# Patient Record
Sex: Male | Born: 1945
Health system: Southern US, Community
[De-identification: ages and names within clinical notes are randomized; demographics above are authoritative.]

## PROBLEM LIST (undated history)

## (undated) DIAGNOSIS — J301 Allergic rhinitis due to pollen: Secondary | ICD-10-CM

## (undated) DIAGNOSIS — D649 Anemia, unspecified: Secondary | ICD-10-CM

## (undated) DIAGNOSIS — R011 Cardiac murmur, unspecified: Secondary | ICD-10-CM

## (undated) DIAGNOSIS — I6529 Occlusion and stenosis of unspecified carotid artery: Secondary | ICD-10-CM

## (undated) DIAGNOSIS — N419 Inflammatory disease of prostate, unspecified: Secondary | ICD-10-CM

## (undated) DIAGNOSIS — E7439 Other disorders of intestinal carbohydrate absorption: Secondary | ICD-10-CM

## (undated) DIAGNOSIS — H9190 Unspecified hearing loss, unspecified ear: Secondary | ICD-10-CM

## (undated) DIAGNOSIS — N400 Enlarged prostate without lower urinary tract symptoms: Secondary | ICD-10-CM

## (undated) DIAGNOSIS — M1990 Unspecified osteoarthritis, unspecified site: Secondary | ICD-10-CM

## (undated) DIAGNOSIS — Z9289 Personal history of other medical treatment: Secondary | ICD-10-CM

## (undated) DIAGNOSIS — R200 Anesthesia of skin: Secondary | ICD-10-CM

## (undated) DIAGNOSIS — E7800 Pure hypercholesterolemia, unspecified: Secondary | ICD-10-CM

## (undated) DIAGNOSIS — I499 Cardiac arrhythmia, unspecified: Secondary | ICD-10-CM

## (undated) DIAGNOSIS — I350 Nonrheumatic aortic (valve) stenosis: Secondary | ICD-10-CM

## (undated) DIAGNOSIS — R7303 Prediabetes: Secondary | ICD-10-CM

## (undated) DIAGNOSIS — R972 Elevated prostate specific antigen [PSA]: Secondary | ICD-10-CM

## (undated) HISTORY — DX: Personal history of other medical treatment: Z92.89

## (undated) HISTORY — DX: Elevated prostate specific antigen (PSA): R97.20

## (undated) HISTORY — PX: COLONOSCOPY: SHX174

## (undated) HISTORY — DX: Pure hypercholesterolemia, unspecified: E78.00

## (undated) HISTORY — PX: SHOULDER ARTHROSCOPY: SHX128

## (undated) HISTORY — DX: Other disorders of intestinal carbohydrate absorption: E74.39

## (undated) HISTORY — DX: Benign prostatic hyperplasia without lower urinary tract symptoms: N40.0

## (undated) HISTORY — DX: Inflammatory disease of prostate, unspecified: N41.9

## (undated) HISTORY — DX: Occlusion and stenosis of unspecified carotid artery: I65.29

## (undated) HISTORY — PX: TOOTH EXTRACTION: SUR596

## (undated) HISTORY — PX: HIP SURGERY: SHX245

## (undated) HISTORY — DX: Allergic rhinitis due to pollen: J30.1

## (undated) HISTORY — DX: Nonrheumatic aortic (valve) stenosis: I35.0

---

## 1999-02-04 ENCOUNTER — Observation Stay (HOSPITAL_COMMUNITY): Admission: EM | Admit: 1999-02-04 | Discharge: 1999-02-05 | Payer: Self-pay | Admitting: Emergency Medicine

## 1999-02-04 ENCOUNTER — Encounter: Payer: Self-pay | Admitting: Emergency Medicine

## 1999-02-06 ENCOUNTER — Encounter: Payer: Self-pay | Admitting: Cardiology

## 2003-08-07 ENCOUNTER — Emergency Department (HOSPITAL_COMMUNITY): Admission: EM | Admit: 2003-08-07 | Discharge: 2003-08-07 | Payer: Self-pay | Admitting: Emergency Medicine

## 2009-09-09 HISTORY — PX: CERVICAL SPINE SURGERY: SHX589

## 2012-06-02 DIAGNOSIS — M5440 Lumbago with sciatica, unspecified side: Secondary | ICD-10-CM

## 2012-06-02 HISTORY — DX: Lumbago with sciatica, unspecified side: M54.40

## 2012-09-09 HISTORY — PX: LUMBAR DISC SURGERY: SHX700

## 2013-09-20 DIAGNOSIS — M48062 Spinal stenosis, lumbar region with neurogenic claudication: Secondary | ICD-10-CM | POA: Insufficient documentation

## 2013-09-20 DIAGNOSIS — M25551 Pain in right hip: Secondary | ICD-10-CM | POA: Insufficient documentation

## 2013-10-05 ENCOUNTER — Ambulatory Visit: Payer: Self-pay | Admitting: Family Medicine

## 2013-11-30 DIAGNOSIS — Z9889 Other specified postprocedural states: Secondary | ICD-10-CM | POA: Insufficient documentation

## 2015-10-24 DIAGNOSIS — M5412 Radiculopathy, cervical region: Secondary | ICD-10-CM | POA: Diagnosis not present

## 2015-10-24 DIAGNOSIS — M5126 Other intervertebral disc displacement, lumbar region: Secondary | ICD-10-CM | POA: Diagnosis not present

## 2015-10-24 DIAGNOSIS — M4806 Spinal stenosis, lumbar region: Secondary | ICD-10-CM | POA: Diagnosis not present

## 2015-10-24 DIAGNOSIS — R1030 Lower abdominal pain, unspecified: Secondary | ICD-10-CM | POA: Diagnosis not present

## 2016-05-03 DIAGNOSIS — Z9889 Other specified postprocedural states: Secondary | ICD-10-CM | POA: Diagnosis not present

## 2016-05-03 DIAGNOSIS — M4806 Spinal stenosis, lumbar region: Secondary | ICD-10-CM | POA: Diagnosis not present

## 2016-05-03 DIAGNOSIS — M5412 Radiculopathy, cervical region: Secondary | ICD-10-CM | POA: Diagnosis not present

## 2016-05-03 DIAGNOSIS — R1030 Lower abdominal pain, unspecified: Secondary | ICD-10-CM | POA: Diagnosis not present

## 2016-05-03 DIAGNOSIS — M5126 Other intervertebral disc displacement, lumbar region: Secondary | ICD-10-CM | POA: Diagnosis not present

## 2016-07-02 DIAGNOSIS — R011 Cardiac murmur, unspecified: Secondary | ICD-10-CM | POA: Diagnosis not present

## 2016-07-02 DIAGNOSIS — M539 Dorsopathy, unspecified: Secondary | ICD-10-CM | POA: Diagnosis not present

## 2016-07-02 DIAGNOSIS — Z0000 Encounter for general adult medical examination without abnormal findings: Secondary | ICD-10-CM | POA: Diagnosis not present

## 2016-07-02 DIAGNOSIS — Z79899 Other long term (current) drug therapy: Secondary | ICD-10-CM | POA: Diagnosis not present

## 2016-07-02 DIAGNOSIS — Z125 Encounter for screening for malignant neoplasm of prostate: Secondary | ICD-10-CM | POA: Diagnosis not present

## 2016-07-02 DIAGNOSIS — E785 Hyperlipidemia, unspecified: Secondary | ICD-10-CM | POA: Diagnosis not present

## 2016-07-02 DIAGNOSIS — Z6826 Body mass index (BMI) 26.0-26.9, adult: Secondary | ICD-10-CM | POA: Diagnosis not present

## 2016-07-02 DIAGNOSIS — R7301 Impaired fasting glucose: Secondary | ICD-10-CM | POA: Diagnosis not present

## 2016-07-17 ENCOUNTER — Other Ambulatory Visit (HOSPITAL_COMMUNITY): Payer: Self-pay

## 2016-07-17 DIAGNOSIS — R011 Cardiac murmur, unspecified: Secondary | ICD-10-CM

## 2016-07-22 DIAGNOSIS — R972 Elevated prostate specific antigen [PSA]: Secondary | ICD-10-CM | POA: Diagnosis not present

## 2016-07-30 ENCOUNTER — Ambulatory Visit (HOSPITAL_COMMUNITY)
Admission: RE | Admit: 2016-07-30 | Discharge: 2016-07-30 | Disposition: A | Discharge status: Home / Self Care | Payer: Medicare Other | Source: Ambulatory Visit | Attending: Medical | Admitting: Medical

## 2016-07-30 ENCOUNTER — Other Ambulatory Visit (HOSPITAL_COMMUNITY): Payer: Self-pay

## 2016-07-30 DIAGNOSIS — I501 Left ventricular failure: Secondary | ICD-10-CM | POA: Diagnosis not present

## 2016-07-30 DIAGNOSIS — L570 Actinic keratosis: Secondary | ICD-10-CM | POA: Diagnosis not present

## 2016-07-30 DIAGNOSIS — L814 Other melanin hyperpigmentation: Secondary | ICD-10-CM | POA: Diagnosis not present

## 2016-07-30 DIAGNOSIS — I350 Nonrheumatic aortic (valve) stenosis: Secondary | ICD-10-CM | POA: Insufficient documentation

## 2016-07-30 DIAGNOSIS — R011 Cardiac murmur, unspecified: Secondary | ICD-10-CM | POA: Insufficient documentation

## 2016-07-30 DIAGNOSIS — R202 Paresthesia of skin: Secondary | ICD-10-CM | POA: Diagnosis not present

## 2016-07-30 DIAGNOSIS — Z23 Encounter for immunization: Secondary | ICD-10-CM | POA: Diagnosis not present

## 2016-07-30 DIAGNOSIS — D1800 Hemangioma unspecified site: Secondary | ICD-10-CM | POA: Diagnosis not present

## 2016-07-30 DIAGNOSIS — I351 Nonrheumatic aortic (valve) insufficiency: Secondary | ICD-10-CM | POA: Diagnosis not present

## 2016-07-30 DIAGNOSIS — L821 Other seborrheic keratosis: Secondary | ICD-10-CM | POA: Diagnosis not present

## 2016-07-30 DIAGNOSIS — D225 Melanocytic nevi of trunk: Secondary | ICD-10-CM | POA: Diagnosis not present

## 2016-07-30 NOTE — Progress Notes (Signed)
Echocardiogram 2D Echocardiogram has been performed.  Cody Mcconnell 07/30/2016, 3:31 PM

## 2016-08-30 DIAGNOSIS — R972 Elevated prostate specific antigen [PSA]: Secondary | ICD-10-CM | POA: Insufficient documentation

## 2016-08-30 DIAGNOSIS — N401 Enlarged prostate with lower urinary tract symptoms: Secondary | ICD-10-CM

## 2016-08-30 DIAGNOSIS — N138 Other obstructive and reflux uropathy: Secondary | ICD-10-CM | POA: Insufficient documentation

## 2016-08-30 HISTORY — DX: Elevated prostate specific antigen (PSA): R97.20

## 2016-09-11 DIAGNOSIS — R972 Elevated prostate specific antigen [PSA]: Secondary | ICD-10-CM | POA: Diagnosis not present

## 2016-12-16 ENCOUNTER — Ambulatory Visit (HOSPITAL_COMMUNITY)
Admission: EM | Admit: 2016-12-16 | Discharge: 2016-12-16 | Disposition: A | Discharge status: Home / Self Care | Payer: Medicare Other

## 2016-12-16 DIAGNOSIS — J029 Acute pharyngitis, unspecified: Secondary | ICD-10-CM | POA: Diagnosis not present

## 2016-12-16 DIAGNOSIS — J40 Bronchitis, not specified as acute or chronic: Secondary | ICD-10-CM | POA: Diagnosis not present

## 2016-12-16 DIAGNOSIS — J069 Acute upper respiratory infection, unspecified: Secondary | ICD-10-CM | POA: Diagnosis not present

## 2017-01-23 DIAGNOSIS — H1089 Other conjunctivitis: Secondary | ICD-10-CM | POA: Diagnosis not present

## 2017-02-12 DIAGNOSIS — J343 Hypertrophy of nasal turbinates: Secondary | ICD-10-CM | POA: Diagnosis not present

## 2017-02-12 DIAGNOSIS — H6982 Other specified disorders of Eustachian tube, left ear: Secondary | ICD-10-CM | POA: Diagnosis not present

## 2017-02-12 DIAGNOSIS — H9312 Tinnitus, left ear: Secondary | ICD-10-CM | POA: Diagnosis not present

## 2017-02-12 DIAGNOSIS — J310 Chronic rhinitis: Secondary | ICD-10-CM | POA: Diagnosis not present

## 2017-02-14 DIAGNOSIS — N138 Other obstructive and reflux uropathy: Secondary | ICD-10-CM | POA: Diagnosis not present

## 2017-02-14 DIAGNOSIS — R972 Elevated prostate specific antigen [PSA]: Secondary | ICD-10-CM | POA: Diagnosis not present

## 2017-02-14 DIAGNOSIS — N401 Enlarged prostate with lower urinary tract symptoms: Secondary | ICD-10-CM | POA: Diagnosis not present

## 2017-02-25 DIAGNOSIS — D485 Neoplasm of uncertain behavior of skin: Secondary | ICD-10-CM | POA: Diagnosis not present

## 2017-02-25 DIAGNOSIS — C44222 Squamous cell carcinoma of skin of right ear and external auricular canal: Secondary | ICD-10-CM | POA: Diagnosis not present

## 2017-03-11 DIAGNOSIS — H903 Sensorineural hearing loss, bilateral: Secondary | ICD-10-CM | POA: Diagnosis not present

## 2017-03-11 DIAGNOSIS — H838X3 Other specified diseases of inner ear, bilateral: Secondary | ICD-10-CM | POA: Diagnosis not present

## 2017-03-11 DIAGNOSIS — H6982 Other specified disorders of Eustachian tube, left ear: Secondary | ICD-10-CM | POA: Diagnosis not present

## 2017-03-11 DIAGNOSIS — H9312 Tinnitus, left ear: Secondary | ICD-10-CM | POA: Diagnosis not present

## 2017-04-10 DIAGNOSIS — C44222 Squamous cell carcinoma of skin of right ear and external auricular canal: Secondary | ICD-10-CM | POA: Diagnosis not present

## 2017-07-28 DIAGNOSIS — M25512 Pain in left shoulder: Secondary | ICD-10-CM | POA: Diagnosis not present

## 2017-08-13 DIAGNOSIS — M25512 Pain in left shoulder: Secondary | ICD-10-CM | POA: Diagnosis not present

## 2017-08-15 DIAGNOSIS — R972 Elevated prostate specific antigen [PSA]: Secondary | ICD-10-CM | POA: Diagnosis not present

## 2017-08-15 DIAGNOSIS — N401 Enlarged prostate with lower urinary tract symptoms: Secondary | ICD-10-CM | POA: Diagnosis not present

## 2017-08-15 DIAGNOSIS — N138 Other obstructive and reflux uropathy: Secondary | ICD-10-CM | POA: Diagnosis not present

## 2017-08-20 DIAGNOSIS — D1800 Hemangioma unspecified site: Secondary | ICD-10-CM | POA: Diagnosis not present

## 2017-08-20 DIAGNOSIS — Z85828 Personal history of other malignant neoplasm of skin: Secondary | ICD-10-CM | POA: Diagnosis not present

## 2017-08-20 DIAGNOSIS — D225 Melanocytic nevi of trunk: Secondary | ICD-10-CM | POA: Diagnosis not present

## 2017-08-20 DIAGNOSIS — L570 Actinic keratosis: Secondary | ICD-10-CM | POA: Diagnosis not present

## 2017-08-20 DIAGNOSIS — L821 Other seborrheic keratosis: Secondary | ICD-10-CM | POA: Diagnosis not present

## 2017-08-20 DIAGNOSIS — L814 Other melanin hyperpigmentation: Secondary | ICD-10-CM | POA: Diagnosis not present

## 2017-08-20 DIAGNOSIS — Z23 Encounter for immunization: Secondary | ICD-10-CM | POA: Diagnosis not present

## 2017-09-09 HISTORY — PX: LUMBAR FUSION: SHX111

## 2017-10-06 DIAGNOSIS — Z1211 Encounter for screening for malignant neoplasm of colon: Secondary | ICD-10-CM | POA: Diagnosis not present

## 2017-10-17 DIAGNOSIS — Z1211 Encounter for screening for malignant neoplasm of colon: Secondary | ICD-10-CM | POA: Insufficient documentation

## 2017-11-06 DIAGNOSIS — Z0000 Encounter for general adult medical examination without abnormal findings: Secondary | ICD-10-CM | POA: Diagnosis not present

## 2017-11-06 DIAGNOSIS — I350 Nonrheumatic aortic (valve) stenosis: Secondary | ICD-10-CM | POA: Diagnosis not present

## 2017-11-06 DIAGNOSIS — Z23 Encounter for immunization: Secondary | ICD-10-CM | POA: Diagnosis not present

## 2017-11-06 DIAGNOSIS — E7800 Pure hypercholesterolemia, unspecified: Secondary | ICD-10-CM | POA: Diagnosis not present

## 2017-11-23 DIAGNOSIS — J101 Influenza due to other identified influenza virus with other respiratory manifestations: Secondary | ICD-10-CM | POA: Diagnosis not present

## 2017-12-12 DIAGNOSIS — M5416 Radiculopathy, lumbar region: Secondary | ICD-10-CM | POA: Diagnosis not present

## 2017-12-12 DIAGNOSIS — Z9889 Other specified postprocedural states: Secondary | ICD-10-CM | POA: Diagnosis not present

## 2017-12-15 DIAGNOSIS — I350 Nonrheumatic aortic (valve) stenosis: Secondary | ICD-10-CM | POA: Diagnosis not present

## 2017-12-15 DIAGNOSIS — E7800 Pure hypercholesterolemia, unspecified: Secondary | ICD-10-CM | POA: Diagnosis not present

## 2017-12-16 ENCOUNTER — Other Ambulatory Visit: Payer: Self-pay

## 2017-12-19 ENCOUNTER — Ambulatory Visit (INDEPENDENT_AMBULATORY_CARE_PROVIDER_SITE_OTHER): Payer: Medicare Other | Admitting: Physician Assistant

## 2017-12-19 ENCOUNTER — Encounter: Payer: Self-pay | Admitting: *Deleted

## 2017-12-19 ENCOUNTER — Encounter: Payer: Self-pay | Admitting: Physician Assistant

## 2017-12-19 VITALS — BP 138/76 | HR 99 | Ht 68.5 in | Wt 176.5 lb

## 2017-12-19 DIAGNOSIS — E785 Hyperlipidemia, unspecified: Secondary | ICD-10-CM | POA: Diagnosis not present

## 2017-12-19 DIAGNOSIS — Z8249 Family history of ischemic heart disease and other diseases of the circulatory system: Secondary | ICD-10-CM

## 2017-12-19 DIAGNOSIS — R0989 Other specified symptoms and signs involving the circulatory and respiratory systems: Secondary | ICD-10-CM

## 2017-12-19 DIAGNOSIS — I350 Nonrheumatic aortic (valve) stenosis: Secondary | ICD-10-CM | POA: Diagnosis not present

## 2017-12-19 HISTORY — DX: Nonrheumatic aortic (valve) stenosis: I35.0

## 2017-12-19 NOTE — Patient Instructions (Addendum)
Medication Instructions:  1. Your physician recommends that you continue on your current medications as directed. Please refer to the Current Medication list given to you today.   Labwork: NONE ORDERED TODAY  Testing/Procedures: 1. Your physician has requested that you have a carotid duplex. This test is an ultrasound of the carotid arteries in your neck. It looks at blood flow through these arteries that supply the brain with blood. Allow one hour for this exam. There are no restrictions or special instructions.  2.Your physician has requested that you have an echocardiogram. Echocardiography is a painless test that uses sound waves to create images of your heart. It provides your doctor with information about the size and shape of your heart and how well your heart's chambers and valves are working. This procedure takes approximately one hour. There are no restrictions for this procedure.  3. Your physician has requested that you have an exercise tolerance test. For further information please visit [email or web address]. Please also follow instruction sheet, as given.    Follow-Up: DR. Burt Knack IN 3 MONTHS   Any Other Special Instructions Will Be Listed Below (If Applicable).     If you need a refill on your cardiac medications before your next appointment, please call your pharmacy.

## 2017-12-19 NOTE — Progress Notes (Signed)
Cardiology Office Note:    Date:  12/19/2017   ID:  Cody Mcconnell, DOB [birth year], MRN [id number]  PCP:  Jani Gravel, MD  Cardiologist:  Sherren Mocha, MD   Referring MD: Jani Gravel, MD   Chief Complaint  Patient presents with  . Aortic Stenosis    History of Present Illness:    Cody Mcconnell is a 72 y.o. male with hyperlipidemia, aortic stenosis and a family history of coronary artery disease who is being seen today for the evaluation of aortic stenosis at the request of Jani Gravel, MD.   Cody Mcconnell is here alone today.  His brother is Dr. Bonne Dolores who practiced in [place] until he retired a few years ago.  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  He is quite active.  He swims and plays tennis.  He denies chest pain, shortness of breath, syncope.  He has gotten lightheaded at times, but no near syncope.  He denies orthopnea, paroxysmal nocturnal dyspnea, edema.  He denies syncope.    PAD Screen 12/19/2017  Previous PAD dx? No  Pain with walking? No    Prior CV studies:   The following studies were reviewed today:  Echo 07/30/16 EF 55-60, normal wall motion, normal diastolic function, mild aortic stenosis (mean 9, peak 16), mild LAE  Past Medical History:  Diagnosis Date  . Aortic valve stenosis 12/19/2017   Echo 07/30/16 - EF 55-60, normal wall motion, normal diastolic function, mild aortic stenosis (mean 9, peak 16), mild LAE  . Benign prostate hyperplasia    unspecified whether lower urinary tract sym. present   . Glucose intolerance    "pre-diabetic"  . Hayfever   . Prostatitis    unspecified prostatitis type  . PSA elevation   . Pure hypercholesterolemia     Past Surgical History:  Procedure Laterality Date  . CERVICAL SPINE SURGERY  2011  . COLONOSCOPY    . HIP SURGERY     arthroscopy  . LUMBAR DISC SURGERY  2014   L2-3  . SHOULDER  ARTHROSCOPY      Current Medications: Current Meds  Medication Sig  . aspirin 81 MG chewable tablet Chew by mouth daily. Per patient every other day.  . fluticasone (FLONASE) 50 MCG/ACT nasal spray Place 1 spray into both nostrils daily.  . Multiple Vitamins-Minerals (MULTIVITAMIN ADULT PO) Take 1 tablet by mouth daily.  . Probiotic Product (PROBIOTIC DAILY PO) Take by mouth as directed.  . simvastatin (ZOCOR) 40 MG tablet Take 40 mg by mouth daily.  . Triprolidine-Pseudoephedrine (TRIPROLIDINE-PSE PO) Take by mouth as directed.     Allergies:   Patient has no known allergies.   Social History   Socioeconomic History  . Marital status: Married    Spouse name: Not on file  . Number of children: 2  . Years of education: Not on file  . Highest education level: Not on file  Occupational History  . 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 Occupation: Long Term Care Facilities    Comment: Owns several  Social Needs  . Financial resource strain: Not on file  . Food insecurity:    Worry: Not on file    Inability: Not on file  . Transportation needs:    Medical: Not on file    Non-medical: Not on file  Tobacco Use  . Smoking  status: Former Smoker    Packs/day: 2.00    Years: 9.00    Pack years: 18.00  . Smokeless tobacco: Never Used  Substance and Sexual Activity  . Alcohol use: Never    Frequency: Never  . Drug use: Never  . Sexual activity: Not on file  Lifestyle  . Physical activity:    Days per week: Not on file    Minutes per session: Not on file  . Stress: Not on file  Relationships  . Social connections:    Talks on phone: Not on file    Gets together: Not on file    Attends religious service: Not on file    Active member of club or organization: Not on file    Attends meetings of clubs or organizations: Not on file    Relationship status: Not on file  Other Topics Concern  . Not on file  Social History Narrative   Previously in [place], [place] -  now in [place] is Dr. Bonne Dolores (retired) from [place], [place]     Family Hx: The patient's family history includes CAD in his father; CAD (age of onset: 60) in his brother; Congestive Heart Failure in his father; Dementia in his mother.  ROS:   Please see the history of present illness.    ROS All other systems reviewed and are negative.   EKGs/Labs/Other Test Reviewed:    EKG:  EKG is  ordered today.  The ekg ordered today demonstrates normal sinus rhythm, heart rate 86, normal axis, QTC 421  Recent Labs: No results found for requested labs within last 8760 hours.   Recent Lipid Panel No results found for: CHOL, TRIG, HDL, CHOLHDL, LDLCALC, LDLDIRECT  Physical Exam:    VS:  BP 138/76   Pulse 99   Ht 5' 8.5" (1.74 m)   Wt 176 lb 8 oz (80.1 kg)   SpO2 98%   BMI 26.45 kg/m     Wt Readings from Last 3 Encounters:  12/19/17 176 lb 8 oz (80.1 kg)     Physical Exam  Constitutional: He is oriented to person, place, and time. He appears well-developed and well-nourished. No distress.  HENT:  Head: Normocephalic and atraumatic.  Neck: No JVD present.  Cardiovascular: Normal rate, regular rhythm, S1 normal and S2 normal.  Murmur heard.  Harsh crescendo-decrescendo systolic murmur is present with a grade of 2/6 at the upper right sternal border. Pulses:      Carotid pulses are on the right side with bruit.      Dorsalis pedis pulses are 2+ on the right side, and 2+ on the left side.       Posterior tibial pulses are 2+ on the right side, and 2+ on the left side.  Pulmonary/Chest: Effort normal. He has no rales.  Abdominal: Soft. He exhibits no distension.  Musculoskeletal: He exhibits no edema.  Neurological: He is alert and oriented to person, place, and time.  Skin: Skin is warm and dry.    ASSESSMENT & PLAN:    #1.  Aortic valve stenosis Very mild aortic stenosis by echocardiogram November 2017 with mean gradient 9 mmHg.  His exam does not suggest  significant worsening.  However, it has been almost 2 years since his last study.  I have recommended a follow-up echocardiogram to reassess his aortic stenosis.  His PCP referred him to Dr. Burt Knack.  As we are on the same care team, I will arrange follow up with Dr. Legrand Como  Cooper.  -Obtain Echocardiogram  #2.  Right carotid bruit He has what sounds like radiating murmur up into his right carotid.  However, I cannot rule out the possibility of a bruit.  He is interested in assessing for carotid stenosis.    -Obtain a carotid ultrasound.  #3.  FH: CAD (coronary artery disease)  We discussed the role for cardiac CT with calcium score as well as coronary CTA.  He is quite active without symptoms to suggest angina.  At this point, I do not think a cardiac CT with calcium score would be helpful.  He is already on aspirin and statin therapy.  An elevated calcium score would not necessarily change his management.  Also, coronary CTA would not likely be indicated as he is not having symptoms to suggest obstructive coronary disease.  However, I do believe a plain exercise treadmill test would be helpful in terms of screening for ischemic heart disease.  If this is abnormal, certainly we should consider proceeding with either nuclear stress test or coronary CTA.  -Arrange a plain exercise tolerance test  #4.  Hyperlipidemia, unspecified hyperlipidemia type LDL optimal on most recent lab work.  Continue current Rx.     Dispo:  Return in about 3 months (around 03/20/2018) for Routine Follow Up, w/ Dr. Burt Knack.   Medication Adjustments/Labs and Tests Ordered: Current medicines are reviewed at length with the patient today.  Concerns regarding medicines are outlined above.  Orders/Tests:  Orders Placed This Encounter  Procedures  . Exercise Tolerance Test  . EKG 12-Lead  . ECHOCARDIOGRAM COMPLETE   Medication changes: No orders of the defined types were placed in this encounter.  Signed, Richardson Dopp,  PA-C  12/19/2017 2:37 PM    [facility] [place], [place], [place]  [address] Phone: [phone]; Fax: [phone]

## 2017-12-22 DIAGNOSIS — Z0000 Encounter for general adult medical examination without abnormal findings: Secondary | ICD-10-CM | POA: Diagnosis not present

## 2017-12-22 DIAGNOSIS — D649 Anemia, unspecified: Secondary | ICD-10-CM | POA: Diagnosis not present

## 2017-12-22 DIAGNOSIS — R739 Hyperglycemia, unspecified: Secondary | ICD-10-CM | POA: Diagnosis not present

## 2017-12-31 ENCOUNTER — Ambulatory Visit (INDEPENDENT_AMBULATORY_CARE_PROVIDER_SITE_OTHER): Payer: Medicare Other

## 2017-12-31 ENCOUNTER — Ambulatory Visit (HOSPITAL_BASED_OUTPATIENT_CLINIC_OR_DEPARTMENT_OTHER): Payer: Medicare Other

## 2017-12-31 ENCOUNTER — Other Ambulatory Visit: Payer: Self-pay

## 2017-12-31 ENCOUNTER — Encounter: Payer: Self-pay | Admitting: Physician Assistant

## 2017-12-31 ENCOUNTER — Ambulatory Visit (HOSPITAL_COMMUNITY)
Admission: RE | Admit: 2017-12-31 | Discharge: 2017-12-31 | Disposition: A | Discharge status: Home / Self Care | Payer: Medicare Other | Source: Ambulatory Visit | Attending: Physician Assistant | Admitting: Physician Assistant

## 2017-12-31 DIAGNOSIS — Z8249 Family history of ischemic heart disease and other diseases of the circulatory system: Secondary | ICD-10-CM | POA: Diagnosis not present

## 2017-12-31 DIAGNOSIS — E785 Hyperlipidemia, unspecified: Secondary | ICD-10-CM | POA: Insufficient documentation

## 2017-12-31 DIAGNOSIS — Z87891 Personal history of nicotine dependence: Secondary | ICD-10-CM | POA: Insufficient documentation

## 2017-12-31 DIAGNOSIS — R0989 Other specified symptoms and signs involving the circulatory and respiratory systems: Secondary | ICD-10-CM | POA: Insufficient documentation

## 2017-12-31 DIAGNOSIS — I350 Nonrheumatic aortic (valve) stenosis: Secondary | ICD-10-CM

## 2017-12-31 DIAGNOSIS — I6523 Occlusion and stenosis of bilateral carotid arteries: Secondary | ICD-10-CM | POA: Insufficient documentation

## 2017-12-31 LAB — EXERCISE TOLERANCE TEST
Estimated workload: 11.7 METS
Exercise duration (min): 10 min
Exercise duration (sec): 0 s
MPHR: 150 {beats}/min
Peak HR: 150 {beats}/min
Percent HR: 100 %
RPE: 17
Rest HR: 72 {beats}/min

## 2018-01-01 ENCOUNTER — Encounter: Payer: Self-pay | Admitting: Physician Assistant

## 2018-01-08 DIAGNOSIS — S3981XA Other specified injuries of abdomen, initial encounter: Secondary | ICD-10-CM | POA: Diagnosis not present

## 2018-01-25 ENCOUNTER — Encounter (HOSPITAL_COMMUNITY): Payer: Self-pay | Admitting: Emergency Medicine

## 2018-01-25 ENCOUNTER — Emergency Department (HOSPITAL_COMMUNITY): Payer: Medicare Other

## 2018-01-25 ENCOUNTER — Other Ambulatory Visit: Payer: Self-pay

## 2018-01-25 ENCOUNTER — Emergency Department (HOSPITAL_COMMUNITY)
Admission: EM | Admit: 2018-01-25 | Discharge: 2018-01-25 | Disposition: A | Discharge status: Home / Self Care | Payer: Medicare Other | Attending: Emergency Medicine | Admitting: Emergency Medicine

## 2018-01-25 DIAGNOSIS — Z87891 Personal history of nicotine dependence: Secondary | ICD-10-CM | POA: Diagnosis not present

## 2018-01-25 DIAGNOSIS — M51369 Other intervertebral disc degeneration, lumbar region without mention of lumbar back pain or lower extremity pain: Secondary | ICD-10-CM

## 2018-01-25 DIAGNOSIS — Z79899 Other long term (current) drug therapy: Secondary | ICD-10-CM | POA: Diagnosis not present

## 2018-01-25 DIAGNOSIS — M5136 Other intervertebral disc degeneration, lumbar region: Secondary | ICD-10-CM | POA: Insufficient documentation

## 2018-01-25 DIAGNOSIS — M1611 Unilateral primary osteoarthritis, right hip: Secondary | ICD-10-CM | POA: Diagnosis not present

## 2018-01-25 DIAGNOSIS — R1030 Lower abdominal pain, unspecified: Secondary | ICD-10-CM | POA: Diagnosis not present

## 2018-01-25 DIAGNOSIS — Z7982 Long term (current) use of aspirin: Secondary | ICD-10-CM | POA: Diagnosis not present

## 2018-01-25 DIAGNOSIS — M25551 Pain in right hip: Secondary | ICD-10-CM | POA: Diagnosis present

## 2018-01-25 IMAGING — DX DG LUMBAR SPINE 2-3V
3 series · 3 of 3 positions shown · non-contrast
Comparison: MRI lumbar spine dated [DATE].

CLINICAL DATA: Right groin pain.

EXAM:
LUMBAR SPINE - 2-3 VIEW

[l-spine ap]
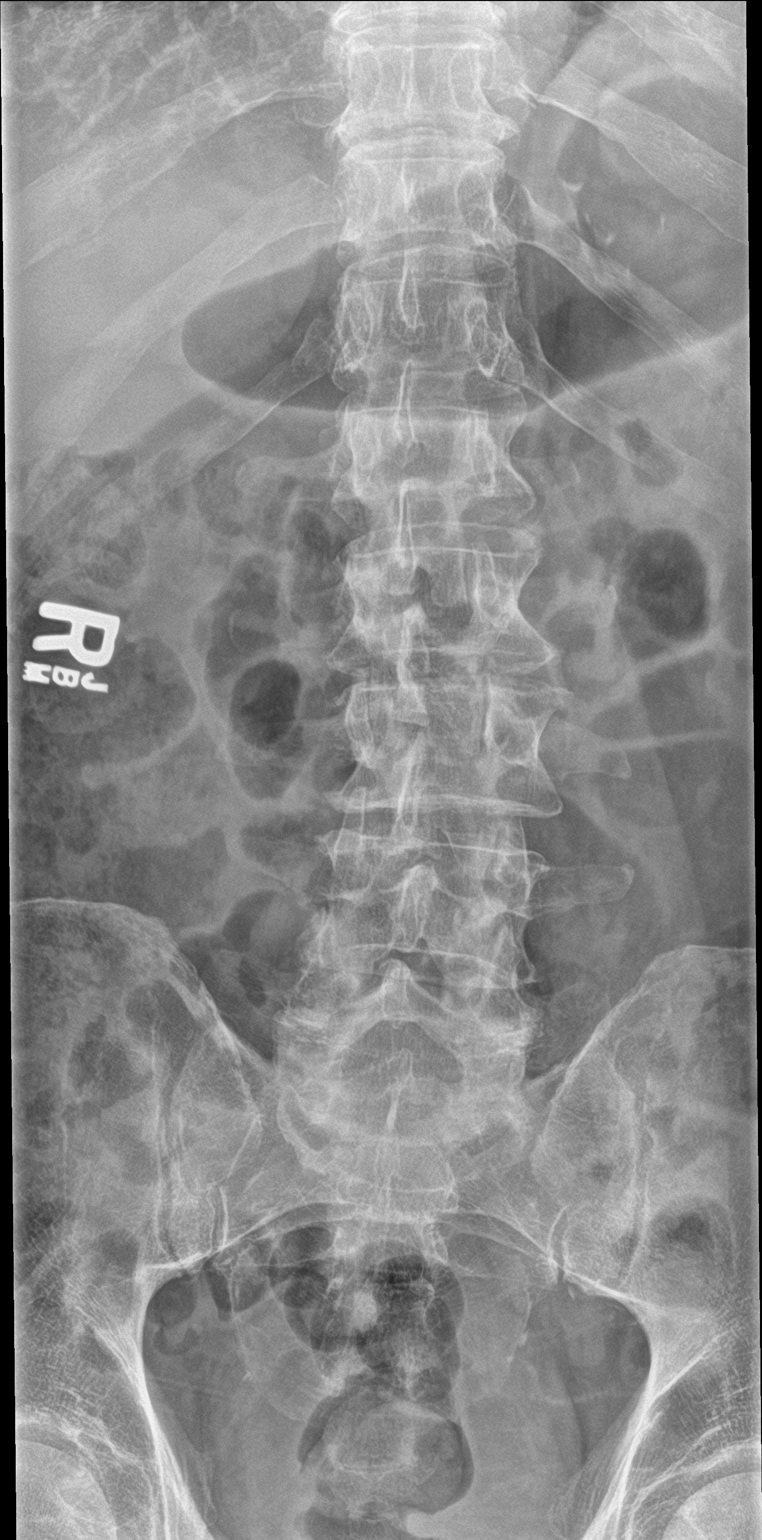

[l-spine lat]
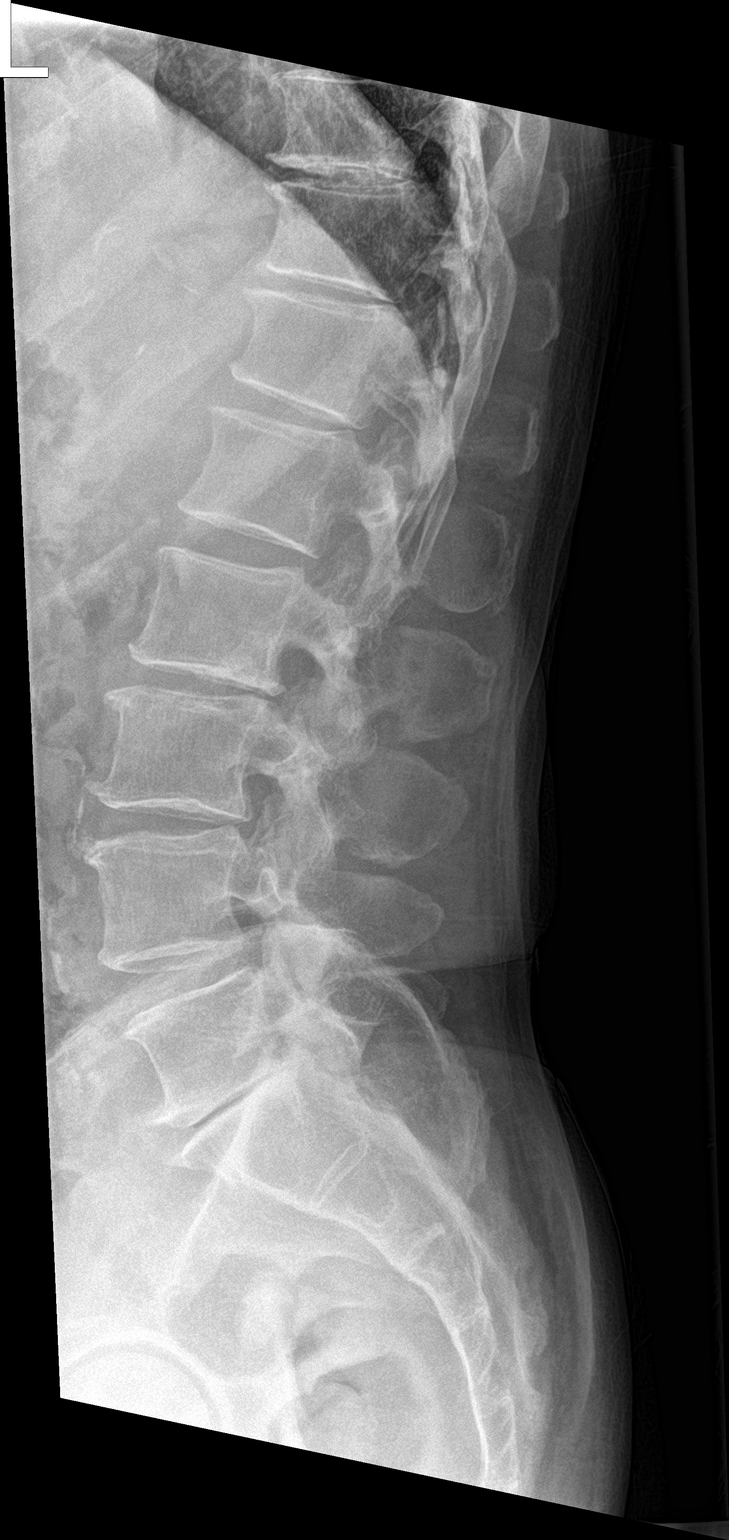

[l-spine spot]
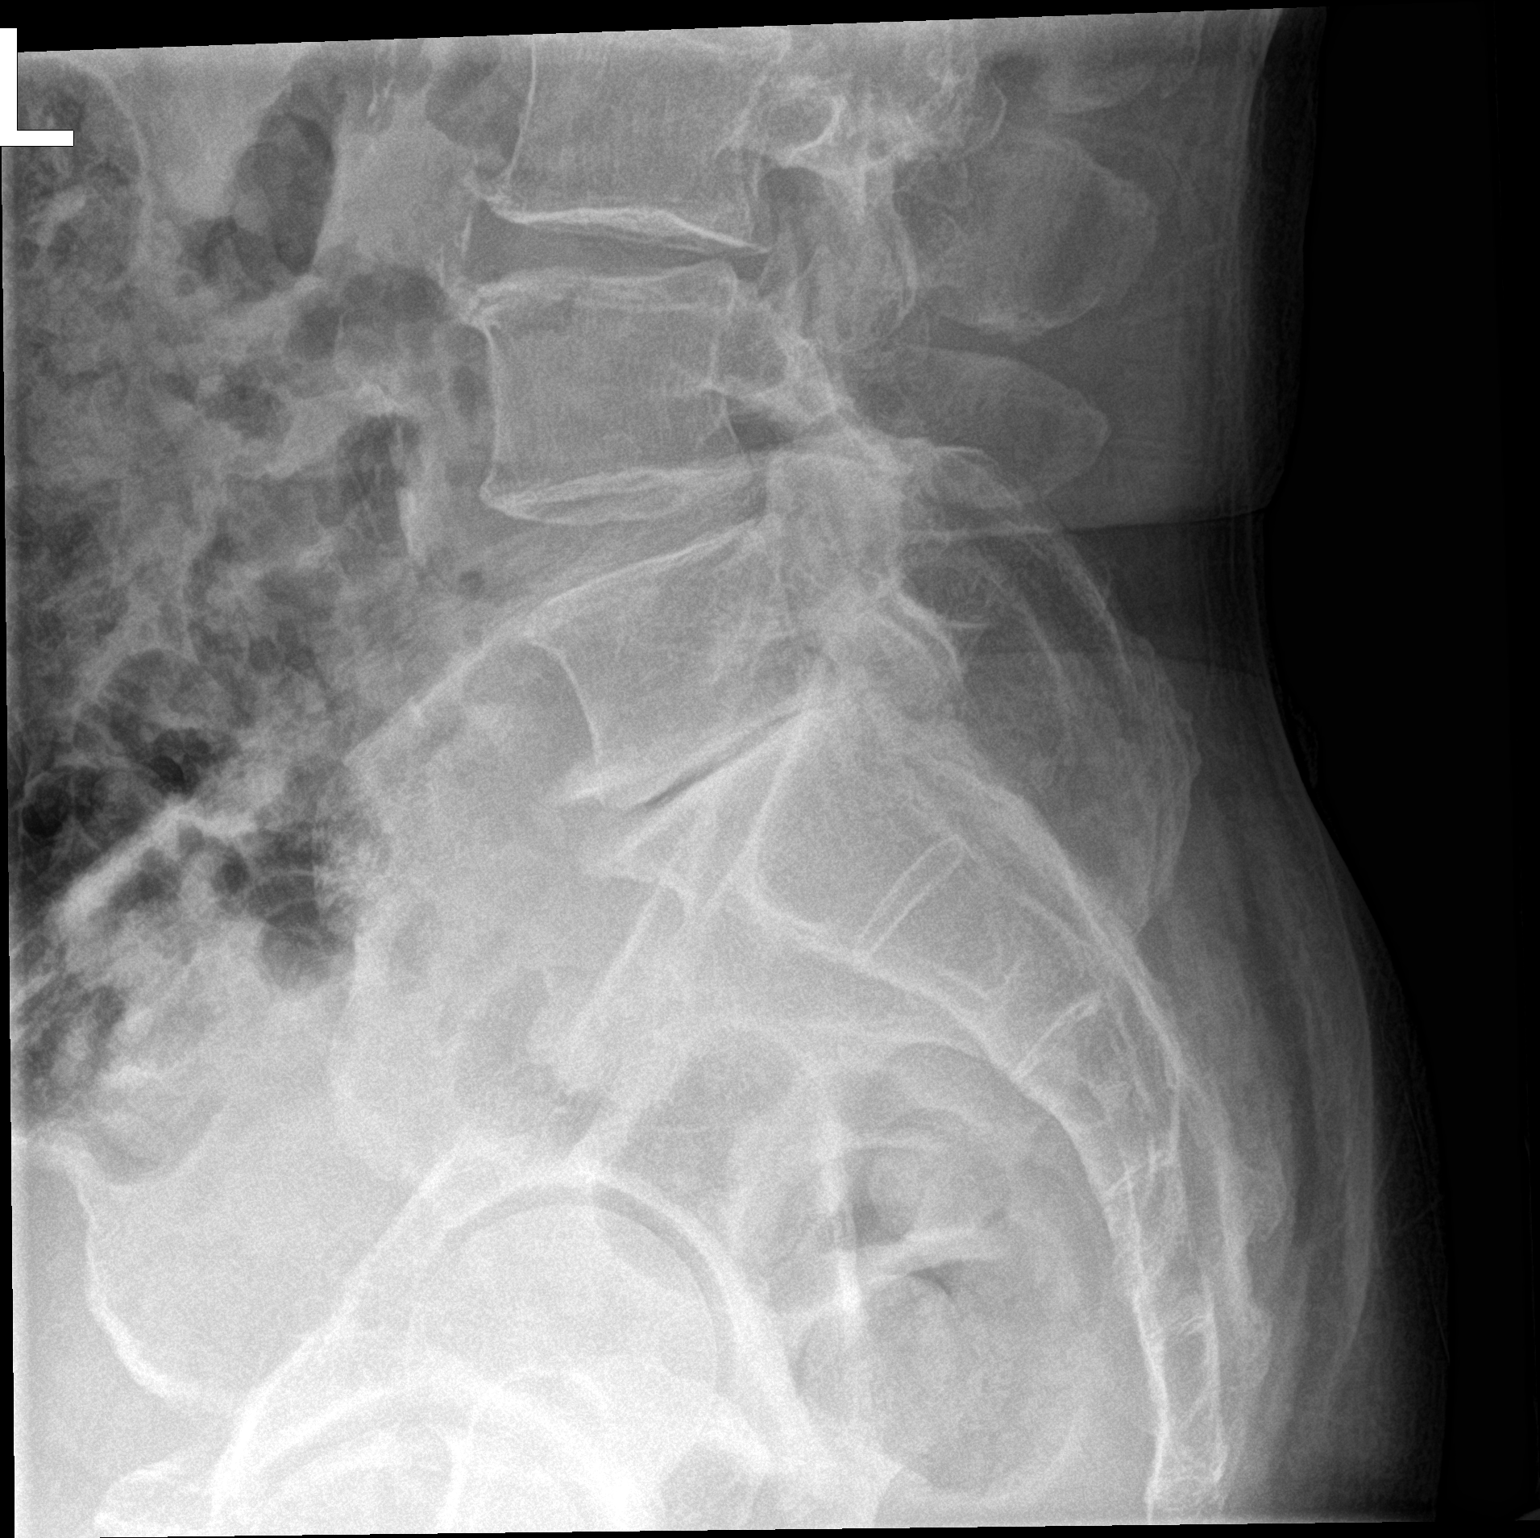

[3 of 3 positions shown; findings below may reference images not displayed]

FINDINGS: Five lumbar type vertebral bodies. No acute fracture or subluxation.
Vertebral body heights are preserved. Alignment is normal. Mild disc
height loss at L2-L3 and L4-L5. Severe disc height loss at L5-S1.
Moderate lower lumbar facet arthropathy. Findings are similar to
prior study. The sacroiliac joints are unremarkable.
IMPRESSION: 1.  No acute osseous abnormality.
2. Stable degenerative changes of the lumbar spine as described
above.

## 2018-01-25 IMAGING — DX DG HIP (WITH OR WITHOUT PELVIS) 2-3V*R*
3 series · 3 of 3 positions shown · non-contrast
Comparison: None.

CLINICAL DATA: Right groin pain.

EXAM:
DG HIP (WITH OR WITHOUT PELVIS) 2-3V RIGHT

[pelvis ap]
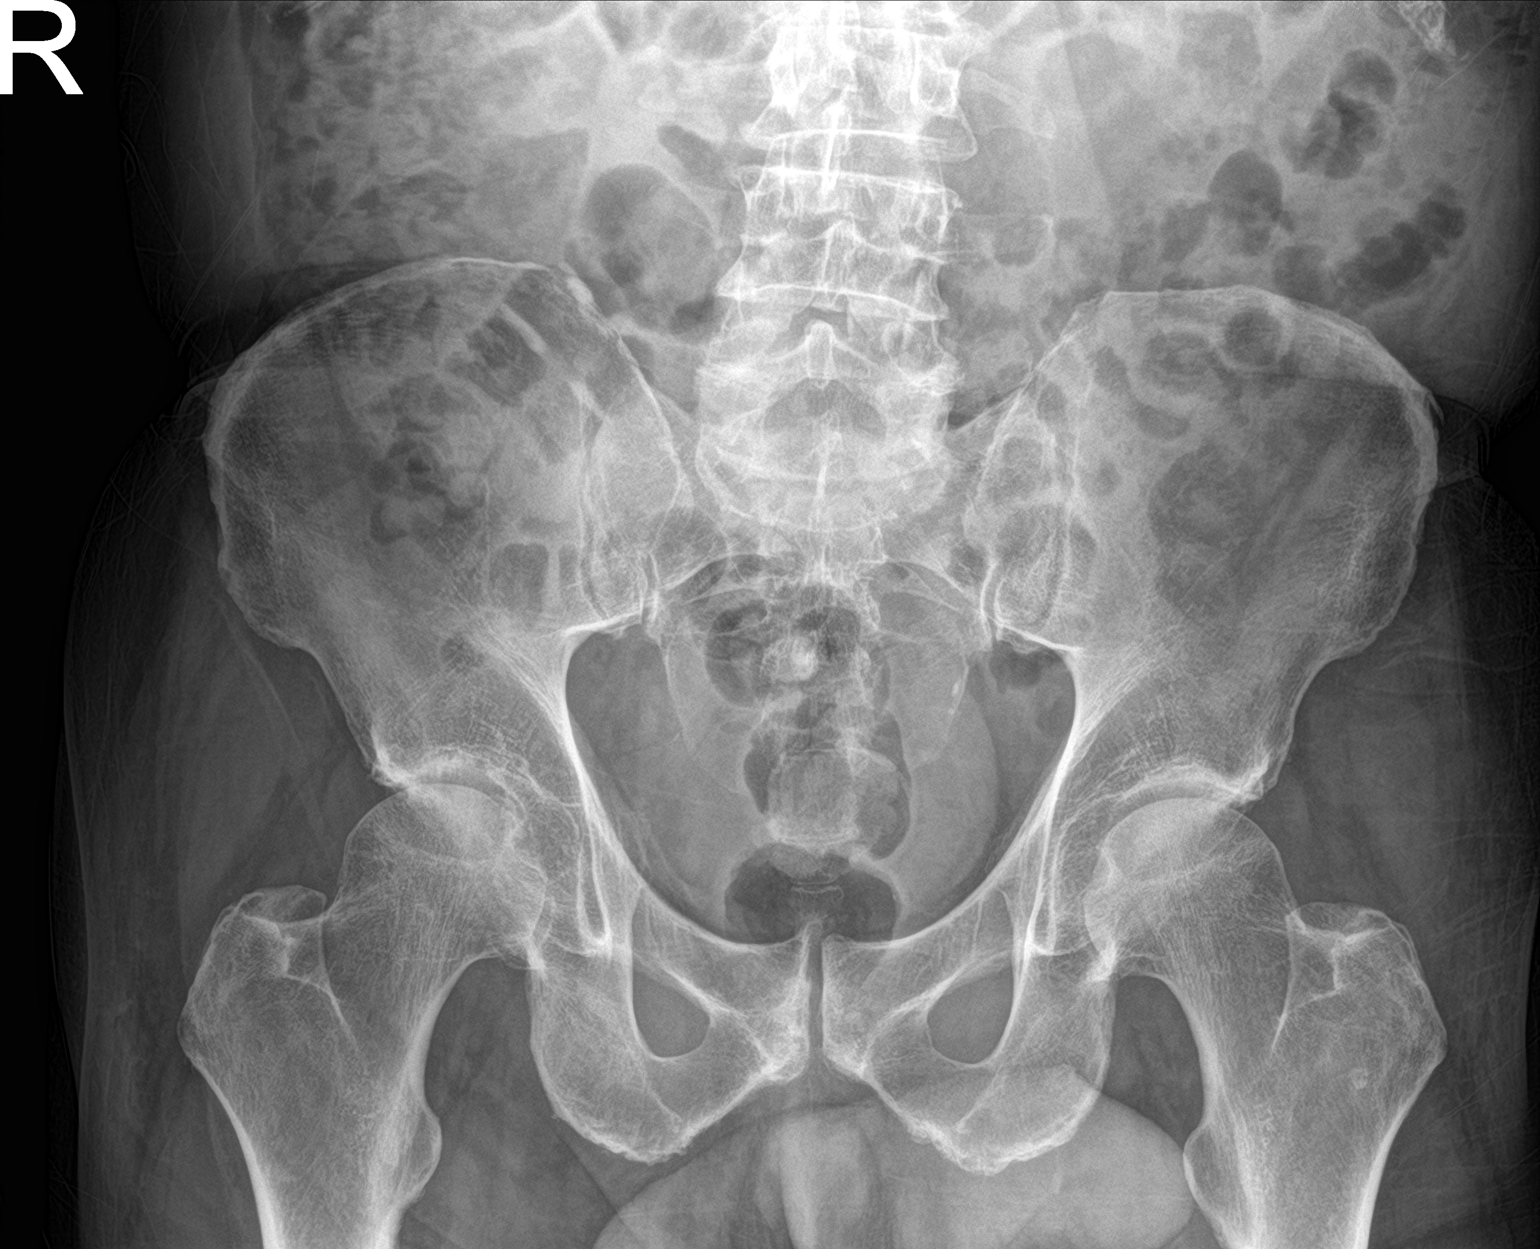

[hip ap]
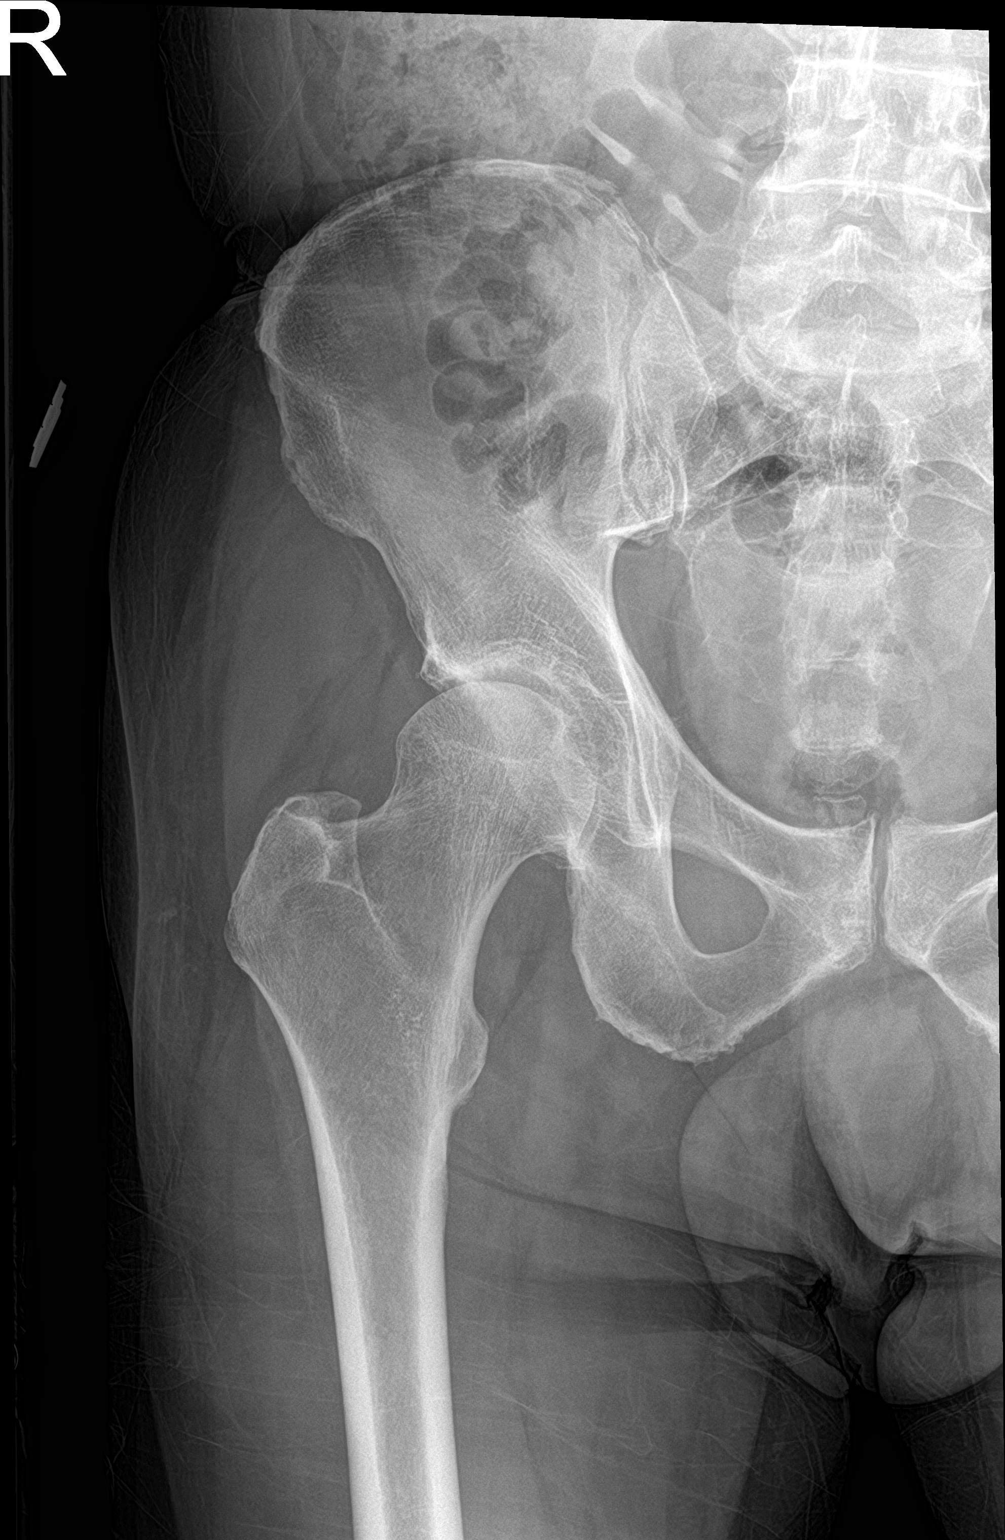

[hip lat]
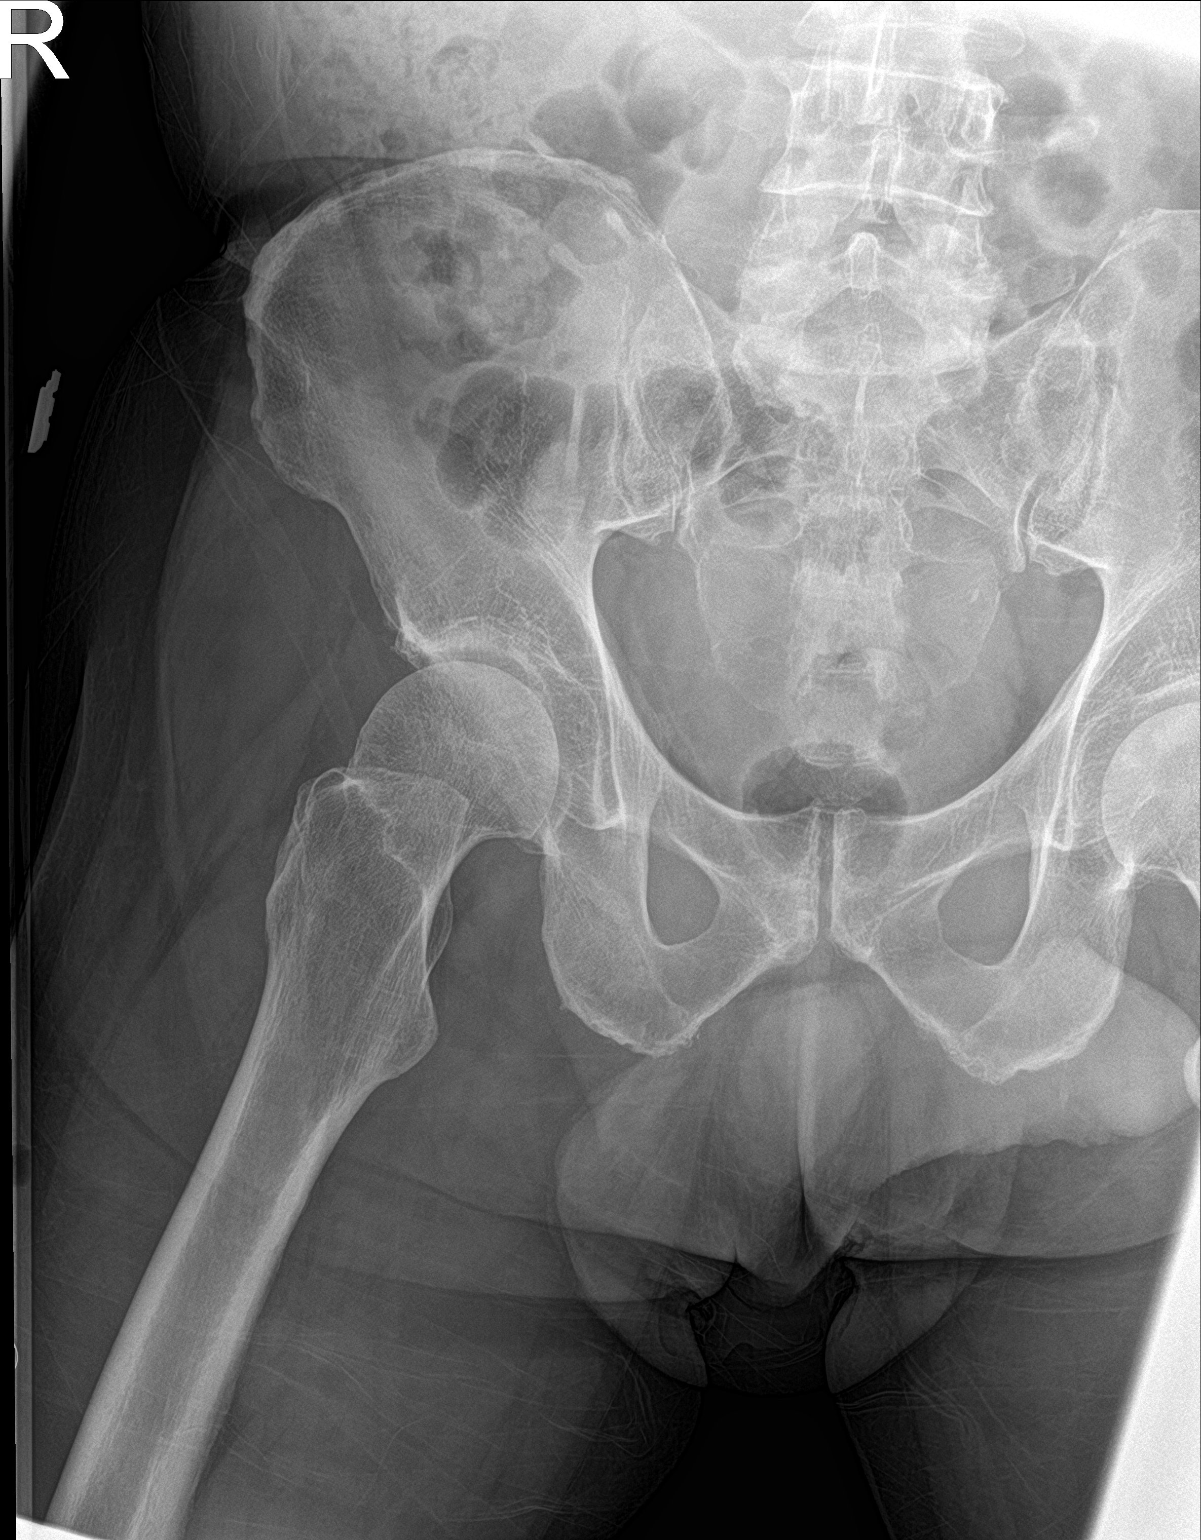

[3 of 3 positions shown; findings below may reference images not displayed]

FINDINGS: No acute fracture or dislocation. Mild right hip joint space
narrowing superiorly with subchondral sclerosis and cystic change.
Left hip joint space is relatively preserved. Mild degenerative
changes of the pubic symphysis. The sacroiliac joints are
unremarkable. Soft tissues are unremarkable.
IMPRESSION: 1.  No acute osseous abnormality.
2. Mild right hip osteoarthritis.

## 2018-01-25 MED ORDER — ACETAMINOPHEN 500 MG PO TABS
1000.0000 mg | ORAL_TABLET | Freq: Once | ORAL | Status: AC
  Administered 2018-01-25: 1000 mg via ORAL
  Filled 2018-01-25: qty 2

## 2018-01-25 MED ORDER — HYDROCODONE-ACETAMINOPHEN 5-325 MG PO TABS: 1.0000 | ORAL_TABLET | Freq: Four times a day (QID) | ORAL | 0 refills | Status: DC | PRN

## 2018-01-25 MED ORDER — TRAMADOL HCL 50 MG PO TABS
50.0000 mg | ORAL_TABLET | Freq: Once | ORAL | Status: AC
  Administered 2018-01-25: 50 mg via ORAL
  Filled 2018-01-25: qty 1

## 2018-01-25 MED ORDER — PREDNISONE 20 MG PO TABS: ORAL_TABLET | ORAL | 0 refills | Status: DC

## 2018-01-25 NOTE — ED Triage Notes (Signed)
Pt. Stated, Ive had rt. Groin pain 4 days , I jumped onto a boat and since then Ive had horrible pain since .

## 2018-01-25 NOTE — ED Provider Notes (Signed)
[facility] EMERGENCY DEPARTMENT Provider Note   CSN: [id number] Arrival date & time: 01/25/18  6283     History   Chief Complaint Chief Complaint  Patient presents with  . Groin Pain  . Leg Pain    HPI Cody Mcconnell is a 72 y.o. male.  HPI Patient presents with right hip pain radiating down to his knee.  Pain is been present for several months but worsened over the last 4 days after jumping off of a boat into water.  States the pain is worse with weightbearing.  No numbness or weakness the patient states that the leg sometimes gives out on him.  Denies bowel or bladder incontinence.  Denies low back pain.  States he has a history of lumbar disc disease and has had steroid injections in his back before for similar pain. Past Medical History:  Diagnosis Date  . Aortic valve stenosis 12/19/2017   Echo 07/30/16 - EF 55-60, normal wall motion, normal diastolic function, mild aortic stenosis (mean 9, peak 16), mild LAE // Echo 4/19:  EF 60-65, no RWMA, Gr 2 DD, mild AS (mean 11, peak 24)   . Benign prostate hyperplasia    unspecified whether lower urinary tract sym. present   . Carotid atherosclerosis    Carotid US 4/19:  bilat ICA 1-39; vertebrals and subclavians normal  . Glucose intolerance    "pre-diabetic"  . Hayfever   . History of exercise stress test    GXT 4/19:  ETT with good exercise tolerance (10:00); no chest pain; normal BP response; no diagnostic ST changes; negative adequate ETT.  . Prostatitis    unspecified prostatitis type  . PSA elevation   . Pure hypercholesterolemia     Patient Active Problem List   Diagnosis Date Noted  . Aortic valve stenosis 12/19/2017    Past Surgical History:  Procedure Laterality Date  . CERVICAL SPINE SURGERY  2011  . COLONOSCOPY    . HIP SURGERY     arthroscopy  . LUMBAR DISC SURGERY  2014   L2-3  . SHOULDER ARTHROSCOPY          Home Medications    Prior to Admission medications     Medication Sig Start Date End Date Taking? Authorizing Provider  aspirin 81 MG chewable tablet Chew by mouth daily. Per patient every other day.   Yes [provider]  cetirizine (ZYRTEC) 10 MG tablet Take 10 mg by mouth every other day.   Yes [provider]  co-enzyme Q-10 30 MG capsule Take 30 mg by mouth daily.   Yes [provider]  fexofenadine (ALLEGRA ALLERGY) 180 MG tablet Take 180 mg by mouth every other day.   Yes [provider]  fluticasone (FLONASE) 50 MCG/ACT nasal spray Place 1 spray into both nostrils daily.   Yes [provider]  Multiple Vitamins-Minerals (MULTIVITAMIN ADULT PO) Take 1 tablet by mouth daily. 05/23/10  Yes [provider]  Probiotic Product (PROBIOTIC DAILY PO) Take by mouth as directed.   Yes [provider]  simvastatin (ZOCOR) 40 MG tablet Take 40 mg by mouth daily.   Yes [provider]  TURMERIC PO Take 1 tablet by mouth daily.   Yes [provider]  HYDROcodone-acetaminophen (NORCO) 5-325 MG tablet Take 1 tablet by mouth every 6 (six) hours as needed for severe pain. 01/25/18   Julianne Rice, MD  predniSONE (DELTASONE) 20 MG tablet 3 tabs po day one, then 2 po daily x  4 days 01/25/18   Julianne Rice, MD    Family History Family History  Problem Relation Age of Onset  . Dementia Mother   . Congestive Heart Failure Father   . CAD Father        quad bypass at age 66  . CAD Brother 27    Social History Social History   Tobacco Use  . Smoking status: Former Smoker    Packs/day: 2.00    Years: 9.00    Pack years: 18.00  . Smokeless tobacco: Never Used  Substance Use Topics  . Alcohol use: Never    Frequency: Never  . Drug use: Never     Allergies   Patient has no known allergies.   Review of Systems Review of Systems  Constitutional: Negative for chills and fever.  Respiratory: Negative for shortness of breath.   Cardiovascular: Negative for chest  pain and leg swelling.  Gastrointestinal: Negative for abdominal pain, constipation, nausea and vomiting.  Genitourinary: Negative for difficulty urinating, dysuria, flank pain, frequency and hematuria.  Musculoskeletal: Positive for arthralgias. Negative for back pain and neck pain.  Skin: Negative for rash and wound.  Neurological: Negative for dizziness, weakness, light-headedness, numbness and headaches.  All other systems reviewed and are negative.    Physical Exam Updated Vital Signs BP 125/62   Pulse 73   Temp 98.4 F (36.9 C) (Oral)   Resp 17   Ht 5' 9.5" (1.765 m)   Wt 78.9 kg (174 lb)   SpO2 99%   BMI 25.33 kg/m   Physical Exam  Constitutional: He is oriented to person, place, and time. He appears well-developed and well-nourished.  HENT:  Head: Normocephalic and atraumatic.  Mouth/Throat: Oropharynx is clear and moist.  Eyes: Pupils are equal, round, and reactive to light. EOM are normal.  Neck: Normal range of motion. Neck supple.  Cardiovascular: Normal rate and regular rhythm.  Pulmonary/Chest: Effort normal and breath sounds normal.  Abdominal: Soft. Bowel sounds are normal. There is no tenderness. There is no rebound and no guarding.  Musculoskeletal: Normal range of motion. He exhibits no edema or tenderness.  No midline thoracic or lumbar tenderness.  No CVA tenderness.  Patient has full range of motion of the right hip and right knee without pain.  No obvious deformity or shortening of the right lower extremity.  No calf swelling, tenderness or asymmetry.  Distal pulses are 2+.  No tenderness to palpation at the inguinal fold.  No appreciated hernias.  Neurological: He is alert and oriented to person, place, and time.  5/5 motor in all extremities.  Sensation fully intact without saddle anesthesia.  Skin: Skin is warm and dry. Capillary refill takes less than 2 seconds. No rash noted. No erythema.  Psychiatric: He has a normal mood and affect. His behavior is  normal.  Nursing note and vitals reviewed.    ED Treatments / Results  Labs (all labs ordered are listed, but only abnormal results are displayed) Labs Reviewed - No data to display  EKG None  Radiology Dg Lumbar Spine 2-3 Views  Result Date: 01/25/2018 CLINICAL DATA:  Right groin pain. EXAM: LUMBAR SPINE - 2-3 VIEW COMPARISON:  MRI lumbar spine dated June 04, 2012. FINDINGS: Five lumbar type vertebral bodies. No acute fracture or subluxation. Vertebral body heights are preserved. Alignment is normal. Mild disc height loss at L2-L3 and L4-L5. Severe disc height loss at L5-S1. Moderate lower lumbar facet arthropathy. Findings are similar to prior study. The sacroiliac joints  are unremarkable. IMPRESSION: 1.  No acute osseous abnormality. 2. Stable degenerative changes of the lumbar spine as described above. Electronically Signed   By: Titus Dubin M.D.   On: 01/25/2018 09:45   Dg Hip Unilat W Or Wo Pelvis 2-3 Views Right  Result Date: 01/25/2018 CLINICAL DATA:  Right groin pain. EXAM: DG HIP (WITH OR WITHOUT PELVIS) 2-3V RIGHT COMPARISON:  None. FINDINGS: No acute fracture or dislocation. Mild right hip joint space narrowing superiorly with subchondral sclerosis and cystic change. Left hip joint space is relatively preserved. Mild degenerative changes of the pubic symphysis. The sacroiliac joints are unremarkable. Soft tissues are unremarkable. IMPRESSION: 1.  No acute osseous abnormality. 2. Mild right hip osteoarthritis. Electronically Signed   By: Titus Dubin M.D.   On: 01/25/2018 09:47    Procedures Procedures (including critical care time)  Medications Ordered in ED Medications  traMADol (ULTRAM) tablet 50 mg (50 mg Oral Given 01/25/18 1005)  acetaminophen (TYLENOL) tablet 1,000 mg (1,000 mg Oral Given 01/25/18 1005)     Initial Impression / Assessment and Plan / ED Course  I have reviewed the triage vital signs and the nursing notes.  Pertinent labs & imaging  results that were available during my care of the patient were reviewed by me and considered in my medical decision making (see chart for details).     No red flag signs or symptoms.  No lower extremity swelling, asymmetry or tenderness.  Distal pulses intact.  Low suspicion of DVT.  X-rays with degenerative disc disease and mild osteoarthritis of the right hip.  Both may be contributing to his symptoms.  Patient states he will make an appointment to follow-up with his orthopedist tomorrow.  Strict return precautions have been given.  Final Clinical Impressions(s) / ED Diagnoses   Final diagnoses:  Lumbar degenerative disc disease  Arthritis of right hip    ED Discharge Orders        Ordered    HYDROcodone-acetaminophen (NORCO) 5-325 MG tablet  Every 6 hours PRN     01/25/18 1030    predniSONE (DELTASONE) 20 MG tablet     01/25/18 1030       Julianne Rice, MD 01/25/18 1031

## 2018-01-26 DIAGNOSIS — M5416 Radiculopathy, lumbar region: Secondary | ICD-10-CM | POA: Diagnosis not present

## 2018-01-26 DIAGNOSIS — Z6825 Body mass index (BMI) 25.0-25.9, adult: Secondary | ICD-10-CM | POA: Diagnosis not present

## 2018-01-26 DIAGNOSIS — R030 Elevated blood-pressure reading, without diagnosis of hypertension: Secondary | ICD-10-CM | POA: Diagnosis not present

## 2018-01-27 DIAGNOSIS — M545 Low back pain: Secondary | ICD-10-CM | POA: Diagnosis not present

## 2018-01-27 DIAGNOSIS — M5416 Radiculopathy, lumbar region: Secondary | ICD-10-CM | POA: Diagnosis not present

## 2018-01-30 DIAGNOSIS — M5126 Other intervertebral disc displacement, lumbar region: Secondary | ICD-10-CM | POA: Insufficient documentation

## 2018-01-30 DIAGNOSIS — M47817 Spondylosis without myelopathy or radiculopathy, lumbosacral region: Secondary | ICD-10-CM | POA: Diagnosis not present

## 2018-01-30 DIAGNOSIS — R296 Repeated falls: Secondary | ICD-10-CM | POA: Diagnosis not present

## 2018-01-30 DIAGNOSIS — M5137 Other intervertebral disc degeneration, lumbosacral region: Secondary | ICD-10-CM | POA: Diagnosis not present

## 2018-01-30 DIAGNOSIS — W19XXXA Unspecified fall, initial encounter: Secondary | ICD-10-CM | POA: Diagnosis not present

## 2018-01-30 DIAGNOSIS — M4186 Other forms of scoliosis, lumbar region: Secondary | ICD-10-CM | POA: Diagnosis not present

## 2018-01-30 DIAGNOSIS — I700 Atherosclerosis of aorta: Secondary | ICD-10-CM | POA: Diagnosis not present

## 2018-01-30 DIAGNOSIS — Z79891 Long term (current) use of opiate analgesic: Secondary | ICD-10-CM | POA: Diagnosis not present

## 2018-01-30 DIAGNOSIS — M438X6 Other specified deforming dorsopathies, lumbar region: Secondary | ICD-10-CM | POA: Diagnosis not present

## 2018-01-30 DIAGNOSIS — M47897 Other spondylosis, lumbosacral region: Secondary | ICD-10-CM | POA: Diagnosis not present

## 2018-01-30 DIAGNOSIS — R29890 Loss of height: Secondary | ICD-10-CM | POA: Diagnosis not present

## 2018-01-30 HISTORY — DX: Other intervertebral disc displacement, lumbar region: M51.26

## 2018-02-04 DIAGNOSIS — G834 Cauda equina syndrome: Secondary | ICD-10-CM | POA: Diagnosis not present

## 2018-02-04 DIAGNOSIS — E785 Hyperlipidemia, unspecified: Secondary | ICD-10-CM | POA: Diagnosis not present

## 2018-02-04 DIAGNOSIS — M5116 Intervertebral disc disorders with radiculopathy, lumbar region: Secondary | ICD-10-CM | POA: Diagnosis not present

## 2018-02-04 DIAGNOSIS — M5441 Lumbago with sciatica, right side: Secondary | ICD-10-CM | POA: Diagnosis not present

## 2018-02-04 DIAGNOSIS — M5126 Other intervertebral disc displacement, lumbar region: Secondary | ICD-10-CM | POA: Diagnosis not present

## 2018-02-04 DIAGNOSIS — M48062 Spinal stenosis, lumbar region with neurogenic claudication: Secondary | ICD-10-CM | POA: Diagnosis not present

## 2018-02-04 DIAGNOSIS — Z9889 Other specified postprocedural states: Secondary | ICD-10-CM | POA: Diagnosis not present

## 2018-02-05 DIAGNOSIS — M5116 Intervertebral disc disorders with radiculopathy, lumbar region: Secondary | ICD-10-CM | POA: Diagnosis not present

## 2018-02-06 DIAGNOSIS — R972 Elevated prostate specific antigen [PSA]: Secondary | ICD-10-CM | POA: Diagnosis not present

## 2018-02-06 DIAGNOSIS — N138 Other obstructive and reflux uropathy: Secondary | ICD-10-CM | POA: Diagnosis not present

## 2018-02-06 DIAGNOSIS — N401 Enlarged prostate with lower urinary tract symptoms: Secondary | ICD-10-CM | POA: Diagnosis not present

## 2018-03-03 DIAGNOSIS — R52 Pain, unspecified: Secondary | ICD-10-CM | POA: Diagnosis not present

## 2018-03-03 DIAGNOSIS — M5489 Other dorsalgia: Secondary | ICD-10-CM | POA: Diagnosis not present

## 2018-03-03 DIAGNOSIS — S8991XA Unspecified injury of right lower leg, initial encounter: Secondary | ICD-10-CM | POA: Diagnosis not present

## 2018-03-03 DIAGNOSIS — M79604 Pain in right leg: Secondary | ICD-10-CM | POA: Diagnosis not present

## 2018-03-03 DIAGNOSIS — Z7982 Long term (current) use of aspirin: Secondary | ICD-10-CM | POA: Diagnosis not present

## 2018-03-03 DIAGNOSIS — E785 Hyperlipidemia, unspecified: Secondary | ICD-10-CM | POA: Diagnosis not present

## 2018-03-03 DIAGNOSIS — M545 Low back pain: Secondary | ICD-10-CM | POA: Diagnosis not present

## 2018-03-03 DIAGNOSIS — M5441 Lumbago with sciatica, right side: Secondary | ICD-10-CM | POA: Diagnosis not present

## 2018-03-04 DIAGNOSIS — Z09 Encounter for follow-up examination after completed treatment for conditions other than malignant neoplasm: Secondary | ICD-10-CM | POA: Diagnosis not present

## 2018-03-04 DIAGNOSIS — R1030 Lower abdominal pain, unspecified: Secondary | ICD-10-CM | POA: Diagnosis not present

## 2018-03-25 DIAGNOSIS — D649 Anemia, unspecified: Secondary | ICD-10-CM | POA: Diagnosis not present

## 2018-03-25 DIAGNOSIS — Z79899 Other long term (current) drug therapy: Secondary | ICD-10-CM | POA: Diagnosis not present

## 2018-03-25 DIAGNOSIS — R739 Hyperglycemia, unspecified: Secondary | ICD-10-CM | POA: Diagnosis not present

## 2018-03-25 DIAGNOSIS — D638 Anemia in other chronic diseases classified elsewhere: Secondary | ICD-10-CM | POA: Diagnosis not present

## 2018-03-25 DIAGNOSIS — I350 Nonrheumatic aortic (valve) stenosis: Secondary | ICD-10-CM | POA: Diagnosis not present

## 2018-04-01 ENCOUNTER — Ambulatory Visit: Payer: Medicare Other | Admitting: Cardiovascular Disease

## 2018-04-06 ENCOUNTER — Ambulatory Visit (INDEPENDENT_AMBULATORY_CARE_PROVIDER_SITE_OTHER): Payer: Medicare Other | Admitting: Cardiovascular Disease

## 2018-04-06 ENCOUNTER — Encounter: Payer: Self-pay | Admitting: Cardiovascular Disease

## 2018-04-06 VITALS — BP 130/74 | HR 83 | Ht 69.5 in | Wt 180.8 lb

## 2018-04-06 DIAGNOSIS — I350 Nonrheumatic aortic (valve) stenosis: Secondary | ICD-10-CM

## 2018-04-06 DIAGNOSIS — E782 Mixed hyperlipidemia: Secondary | ICD-10-CM | POA: Diagnosis not present

## 2018-04-06 NOTE — Progress Notes (Signed)
Cardiology Office Note Date:  04/09/2018   ID:  STEFEN JUBA, DOB February 20, 1946, MRN [id number]  PCP:  Jani Gravel, MD  Cardiologist:  Sherren Mocha, MD    Chief Complaint  Patient presents with  . Aortic Stenosis     History of Present Illness: Cody Mcconnell is a 72 y.o. male who presents for follow-up of aortic stenosis.  The patient is here alone today.  He was initially seen in April 2019 by Richardson Dopp for further evaluation of aortic stenosis.  He reported no symptoms.  An echocardiogram was performed and findings were consistent with mild aortic stenosis without significant progression from his previous study in 2017.  He also underwent a carotid ultrasound because of a right carotid bruit.  This demonstrated no significant carotid stenosis.  Finally, an exercise treadmill study was performed to evaluate functional capacity and potential ischemia in the setting of a strong family history of CAD.  The patient had good exercise tolerance with no angina or EKG changes suggestive of ischemia.  The patient feels well from a cardiac perspective.  He denies chest pain, chest pressure, shortness of breath, edema, heart palpitations, orthopnea, or PND.  He has some limitation from low back pain.  Otherwise he is well and has no specific complaints.   Past Medical History:  Diagnosis Date  . Aortic valve stenosis 12/19/2017   Echo 07/30/16 - EF 55-60, normal wall motion, normal diastolic function, mild aortic stenosis (mean 9, peak 16), mild LAE // Echo 4/19:  EF 60-65, no RWMA, Gr 2 DD, mild AS (mean 11, peak 24)   . Benign prostate hyperplasia    unspecified whether lower urinary tract sym. present   . Carotid atherosclerosis    Carotid US 4/19:  bilat ICA 1-39; vertebrals and subclavians normal  . Glucose intolerance    "pre-diabetic"  . Hayfever   . History of exercise stress test    GXT 4/19:  ETT with good exercise tolerance (10:00); no chest pain; normal BP response; no  diagnostic ST changes; negative adequate ETT.  . Prostatitis    unspecified prostatitis type  . PSA elevation   . Pure hypercholesterolemia     Past Surgical History:  Procedure Laterality Date  . CERVICAL SPINE SURGERY  2011  . COLONOSCOPY    . HIP SURGERY     arthroscopy  . LUMBAR DISC SURGERY  2014   L2-3  . SHOULDER ARTHROSCOPY      Current Outpatient Medications  Medication Sig Dispense Refill  . aspirin 81 MG chewable tablet Chew by mouth daily. Per patient every other day.    . cetirizine (ZYRTEC) 10 MG tablet Take 10 mg by mouth every other day.    Marland Kitchen co-enzyme Q-10 30 MG capsule Take 30 mg by mouth daily.    . fexofenadine (ALLEGRA ALLERGY) 180 MG tablet Take 180 mg by mouth every other day.    . fluticasone (FLONASE) 50 MCG/ACT nasal spray Place 1 spray into both nostrils daily.    . Multiple Vitamins-Minerals (MULTIVITAMIN ADULT PO) Take 1 tablet by mouth daily.    . Probiotic Product (PROBIOTIC DAILY PO) Take by mouth as directed.    . simvastatin (ZOCOR) 40 MG tablet Take 40 mg by mouth daily.    . TURMERIC PO Take 1 tablet by mouth daily.    Marland Kitchen HYDROcodone-acetaminophen (NORCO) 5-325 MG tablet Take 1 tablet by mouth every 6 (six) hours as needed for severe pain. (Patient not taking: Reported on 04/06/2018)  10 tablet 0  . predniSONE (DELTASONE) 20 MG tablet 3 tabs po day one, then 2 po daily x 4 days (Patient not taking: Reported on 04/06/2018) 11 tablet 0   No current facility-administered medications for this visit.     Allergies:   Patient has no known allergies.   Social History:  The patient  reports that he has quit smoking. He has a 18.00 pack-year smoking history. He has never used smokeless tobacco. He reports that he does not drink alcohol or use drugs.   Family History:  The patient's family history includes CAD in his father; CAD (age of onset: 53) in his brother; Congestive Heart Failure in his father; Dementia in his mother.    ROS:  Please see the  history of present illness.  Otherwise, review of systems is positive for back pain, muscle pain.  All other systems are reviewed and negative.    PHYSICAL EXAM: VS:  BP 130/74   Pulse 83   Ht 5' 9.5" (1.765 m)   Wt 180 lb 12.8 oz (82 kg)   SpO2 96%   BMI 26.32 kg/m  , BMI Body mass index is 26.32 kg/m. GEN: Well nourished, well developed, in no acute distress  HEENT: normal  Neck: no JVD, no masses. BL carotid bruits R>L Cardiac: RRR with 2/6 harsh systolic murmur at the RUSB      Respiratory:  clear to auscultation bilaterally, normal work of breathing GI: soft, nontender, nondistended, + BS MS: no deformity or atrophy  Ext: no pretibial edema, pedal pulses 2+= bilaterally Skin: warm and dry, no rash Neuro:  Strength and sensation are intact Psych: euthymic mood, full affect  EKG:  EKG is not ordered today.  Recent Labs: No results found for requested labs within last 8760 hours.   Lipid Panel  No results found for: CHOL, TRIG, HDL, CHOLHDL, VLDL, LDLCALC, LDLDIRECT    Wt Readings from Last 3 Encounters:  04/06/18 180 lb 12.8 oz (82 kg)  01/25/18 174 lb (78.9 kg)  12/19/17 176 lb 8 oz (80.1 kg)     Cardiac Studies Reviewed: Carotid ultrasound 12/31/2017: Final Interpretation: Right Carotid: Velocities in the right ICA are consistent with a 1-39% stenosis.  Left Carotid: Velocities in the left ICA are consistent with a 1-39% stenosis.       The extracranial vessels were near-normal with only minimal wall       thickening or plaque.  Vertebrals: Bilateral vertebral arteries demonstrate antegrade flow. Subclavians: Normal flow hemodynamics were seen in bilateral subclavian       arteries.  Exercise treadmill study 12/31/2017: Study Highlights     Blood pressure demonstrated a normal response to exercise.   ETT with good exercise tolerance (10:00); no chest pain; normal BP response; no diagnostic ST changes; negative adequate ETT.     Stress Findings   ECG Baseline ECG exhibits normal sinus rhythm..  Stress Findings The patient exercised following the Bruce protocol.  The patient reported no symptoms during the stress test. The patient experienced no angina during the stress test.   The patient requested the test to be stopped.   Blood pressure and heart rate demonstrated a normal response to exercise. Blood pressure demonstrated a normal response to exercise. Overall, the patient's exercise capacity was excellent.   85% of maximum heart rate was achieved after 6.3 minutes. Recovery time: 5 minutes. The patient's response to exercise was adequate for diagnosis.  Response to Stress Arrhythmias during stress: none.  Arrhythmias during  recovery: none.  There were no significant arrhythmias noted during the test.  ECG was interpretable and there was no significant change from baseline.  Stress Measurements   Baseline Vitals  Rest HR 72 bpm    Rest BP 121/75 mmHg    Exercise Time  Exercise duration (min) 10 min    Exercise duration (sec) 0 sec    Peak Stress Vitals  Peak HR 150 bpm    Peak BP 181/79 mmHg    Exercise Data  MPHR 150 bpm    Percent HR 100 %    RPE 17     Estimated workload 11.7 METS       2D echocardiogram 12/31/2017: Left ventricle:  The cavity size was normal. Wall thickness was normal. Systolic function was normal. The estimated ejection fraction was in the range of 60% to 65%. Wall motion was normal; there were no regional wall motion abnormalities. Features are consistent with a pseudonormal left ventricular filling pattern, with concomitant abnormal relaxation and increased filling pressure (grade 2 diastolic dysfunction).  ------------------------------------------------------------------- Aortic valve:   Moderately thickened, moderately calcified leaflets.  Doppler:   There was mild stenosis.      VTI ratio of LVOT to aortic valve: 0.38. Valve area (VTI): 1.43 cm^2.  Indexed valve area (VTI): 0.72 cm^2/m^2. Peak velocity ratio of LVOT to aortic valve: 0.42. Valve area (Vmax): 1.58 cm^2. Indexed valve area (Vmax): 0.8 cm^2/m^2. Mean velocity ratio of LVOT to aortic valve: 0.4. Valve area (Vmean): 1.54 cm^2. Indexed valve area (Vmean): 0.78 cm^2/m^2.    Mean gradient (S): 11 mm Hg. Peak gradient (S): 24 mm Hg.  ------------------------------------------------------------------- Aorta:  Aortic root: The aortic root was normal in size. Ascending aorta: The ascending aorta was normal in size.  ------------------------------------------------------------------- Mitral valve:   Structurally normal valve.   Leaflet separation was normal.  Doppler:  Transvalvular velocity was within the normal range. There was no evidence for stenosis. There was no regurgitation.    Valve area by pressure half-time: 2.56 cm^2. Indexed valve area by pressure half-time: 1.29 cm^2/m^2.  ------------------------------------------------------------------- Left atrium:  The atrium was normal in size.  ------------------------------------------------------------------- Right ventricle:  The cavity size was normal. Systolic function was normal.  ------------------------------------------------------------------- Pulmonic valve:    The valve appears to be grossly normal. Doppler:  There was no significant regurgitation.  ------------------------------------------------------------------- Tricuspid valve:   Structurally normal valve.   Leaflet separation was normal.  Doppler:  Transvalvular velocity was within the normal range. There was trivial regurgitation.  ------------------------------------------------------------------- Right atrium:  The atrium was normal in size.  ------------------------------------------------------------------- Pericardium:  There was no pericardial effusion.    ASSESSMENT AND PLAN: 1.  Mild aortic stenosis: The patient is not  symptomatic.  His echocardiogram is reviewed as outlined above with hemodynamic findings consistent with mild aortic stenosis.  There has not been significant progression compared to a previous echo study.  We spent time today discussing the natural history of aortic stenosis as well as potential treatment options.  We will plan on clinical follow-up in 1 year and probably a repeat echocardiogram in 2 years unless his exam changes.  2.  Carotid bruits: Suspect radiation of murmur from aortic stenosis now that a carotid ultrasound has been performed and demonstrates no significant carotid obstruction.  3.  Hyperlipidemia: Treated with a statin drug, LDL is at goal. Most recent lipids reviewed with LDL 87/HDL 67.  Current medicines are reviewed with the patient today.  The patient does not have concerns regarding medicines.  Labs/ tests ordered today include:  No orders of the defined types were placed in this encounter.   Disposition:   FU one year  Signed, Sherren Mocha, MD  04/09/2018 5:12 PM    [facility] [address], [place], [place]  [address] Phone: [phone]; Fax: [phone]

## 2018-04-06 NOTE — Patient Instructions (Signed)
Medication Instructions:  Your physician recommends that you continue on your current medications as directed. Please refer to the Current Medication list given to you today.   Labwork: None  Testing/Procedures: Echo in 2 years, will schedule at a later date  Follow-Up: Your physician wants you to follow-up in: 1 year with Dr. Burt Knack. You will receive a reminder letter in the mail two months in advance. If you don't receive a letter, please call our office to schedule the follow-up appointment.   If you need a refill on your cardiac medications before your next appointment, please call your pharmacy.

## 2018-04-09 ENCOUNTER — Encounter: Payer: Self-pay | Admitting: Cardiovascular Disease

## 2018-04-13 DIAGNOSIS — E7800 Pure hypercholesterolemia, unspecified: Secondary | ICD-10-CM | POA: Diagnosis not present

## 2018-04-13 DIAGNOSIS — D649 Anemia, unspecified: Secondary | ICD-10-CM | POA: Diagnosis not present

## 2018-04-13 DIAGNOSIS — I350 Nonrheumatic aortic (valve) stenosis: Secondary | ICD-10-CM | POA: Diagnosis not present

## 2018-04-13 DIAGNOSIS — I10 Essential (primary) hypertension: Secondary | ICD-10-CM | POA: Diagnosis not present

## 2018-04-13 DIAGNOSIS — R739 Hyperglycemia, unspecified: Secondary | ICD-10-CM | POA: Diagnosis not present

## 2018-04-22 DIAGNOSIS — M5441 Lumbago with sciatica, right side: Secondary | ICD-10-CM | POA: Diagnosis not present

## 2018-04-22 DIAGNOSIS — M48062 Spinal stenosis, lumbar region with neurogenic claudication: Secondary | ICD-10-CM | POA: Diagnosis not present

## 2018-04-30 DIAGNOSIS — I350 Nonrheumatic aortic (valve) stenosis: Secondary | ICD-10-CM | POA: Insufficient documentation

## 2018-04-30 DIAGNOSIS — D649 Anemia, unspecified: Secondary | ICD-10-CM | POA: Diagnosis not present

## 2018-04-30 DIAGNOSIS — R7303 Prediabetes: Secondary | ICD-10-CM

## 2018-04-30 DIAGNOSIS — I6523 Occlusion and stenosis of bilateral carotid arteries: Secondary | ICD-10-CM

## 2018-04-30 DIAGNOSIS — M48062 Spinal stenosis, lumbar region with neurogenic claudication: Secondary | ICD-10-CM | POA: Diagnosis not present

## 2018-04-30 HISTORY — DX: Nonrheumatic aortic (valve) stenosis: I35.0

## 2018-04-30 HISTORY — DX: Prediabetes: R73.03

## 2018-04-30 HISTORY — DX: Occlusion and stenosis of bilateral carotid arteries: I65.23

## 2018-05-08 DIAGNOSIS — Z79899 Other long term (current) drug therapy: Secondary | ICD-10-CM | POA: Diagnosis not present

## 2018-05-08 DIAGNOSIS — M5126 Other intervertebral disc displacement, lumbar region: Secondary | ICD-10-CM | POA: Diagnosis not present

## 2018-05-08 DIAGNOSIS — Z7982 Long term (current) use of aspirin: Secondary | ICD-10-CM | POA: Diagnosis not present

## 2018-05-08 DIAGNOSIS — R7303 Prediabetes: Secondary | ICD-10-CM | POA: Diagnosis not present

## 2018-05-08 DIAGNOSIS — Z981 Arthrodesis status: Secondary | ICD-10-CM | POA: Diagnosis not present

## 2018-05-08 DIAGNOSIS — M48062 Spinal stenosis, lumbar region with neurogenic claudication: Secondary | ICD-10-CM | POA: Diagnosis not present

## 2018-05-08 DIAGNOSIS — I350 Nonrheumatic aortic (valve) stenosis: Secondary | ICD-10-CM | POA: Diagnosis not present

## 2018-05-08 DIAGNOSIS — I2510 Atherosclerotic heart disease of native coronary artery without angina pectoris: Secondary | ICD-10-CM | POA: Diagnosis not present

## 2018-05-08 DIAGNOSIS — M48061 Spinal stenosis, lumbar region without neurogenic claudication: Secondary | ICD-10-CM | POA: Diagnosis not present

## 2018-05-08 DIAGNOSIS — M5136 Other intervertebral disc degeneration, lumbar region: Secondary | ICD-10-CM | POA: Diagnosis not present

## 2018-05-08 DIAGNOSIS — E785 Hyperlipidemia, unspecified: Secondary | ICD-10-CM | POA: Diagnosis not present

## 2018-05-08 DIAGNOSIS — Z9889 Other specified postprocedural states: Secondary | ICD-10-CM | POA: Diagnosis not present

## 2018-05-08 DIAGNOSIS — N400 Enlarged prostate without lower urinary tract symptoms: Secondary | ICD-10-CM | POA: Diagnosis not present

## 2018-06-01 DIAGNOSIS — R079 Chest pain, unspecified: Secondary | ICD-10-CM | POA: Diagnosis not present

## 2018-06-01 DIAGNOSIS — R9431 Abnormal electrocardiogram [ECG] [EKG]: Secondary | ICD-10-CM | POA: Diagnosis not present

## 2018-06-01 DIAGNOSIS — I4891 Unspecified atrial fibrillation: Secondary | ICD-10-CM | POA: Diagnosis not present

## 2018-06-01 DIAGNOSIS — R000 Tachycardia, unspecified: Secondary | ICD-10-CM | POA: Diagnosis not present

## 2018-06-05 ENCOUNTER — Encounter: Payer: Self-pay | Admitting: Physician Assistant

## 2018-06-05 ENCOUNTER — Ambulatory Visit (INDEPENDENT_AMBULATORY_CARE_PROVIDER_SITE_OTHER): Payer: Medicare Other | Admitting: Physician Assistant

## 2018-06-05 VITALS — BP 130/64 | HR 76 | Ht 69.5 in | Wt 178.0 lb

## 2018-06-05 DIAGNOSIS — I350 Nonrheumatic aortic (valve) stenosis: Secondary | ICD-10-CM

## 2018-06-05 DIAGNOSIS — I480 Paroxysmal atrial fibrillation: Secondary | ICD-10-CM | POA: Insufficient documentation

## 2018-06-05 DIAGNOSIS — I2510 Atherosclerotic heart disease of native coronary artery without angina pectoris: Secondary | ICD-10-CM

## 2018-06-05 HISTORY — DX: Paroxysmal atrial fibrillation: I48.0

## 2018-06-05 HISTORY — DX: Atherosclerotic heart disease of native coronary artery without angina pectoris: I25.10

## 2018-06-05 MED ORDER — APIXABAN 5 MG PO TABS: 5.0000 mg | ORAL_TABLET | Freq: Two times a day (BID) | ORAL | 3 refills | Status: DC

## 2018-06-05 MED ORDER — DILTIAZEM HCL ER COATED BEADS 120 MG PO CP24: 120.0000 mg | ORAL_CAPSULE | Freq: Every day | ORAL | 3 refills | Status: DC

## 2018-06-05 NOTE — Patient Instructions (Addendum)
Medication Instructions:  CHANGE DILTIAZEM CD TO 120 MG DAILY; NEW RX HAS BEEN SENT IN   Labwork: In 4 weeks - BMET, CBC 07/06/18 LAB IS OPEN FROM 7:30-4:30 PM   Testing/Procedures: None   Follow-Up: Sherren Mocha, MD in 3 months ON 09/30/18 @ 11 AM   Any Other Special Instructions Will Be Listed Below (If Applicable).  Try to avoid taking NSAIDs (Ibuprofen, Advil, Motrin, Naproxen, etc on a regular basis). Try to take Tylenol (Acetaminophen) for pain instead. If you have to take an occasional NSAID, that is ok.  Just do not take it on a regular basis.  If you need a refill on your cardiac medications before your next appointment, please call your pharmacy.

## 2018-06-05 NOTE — Progress Notes (Signed)
Cardiology Office Note:    Date:  06/05/2018   ID:  Cody Mcconnell, DOB June 01, 1946, MRN [id number]  PCP:  Cody Gravel, MD  Cardiologist:  Cody Mocha, MD   Electrophysiologist:  None   Referring MD: Cody Gravel, MD   Chief Complaint  Patient presents with  . Hospitalization Follow-up    AFib    History of Present Illness:    Cody Mcconnell is a 72 y.o. male with hyperlipidemia, aortic stenosis and a family history of coronary artery disease.  He was last seen by Dr. Burt Mcconnell in 03/2018.  He was recently seen in the ED for atrial fibrillation with RVR.  He underwent back surgery in 01/2018 and again in 04/2018.  He was noted to have an elevated HR at his post op follow up with his neurosurgeon and sent to the ED.  He was placed on Cardizem and Apixaban and DC to home.    Mr. Cody Mcconnell returns for follow up on atrial fibrillation.  He is here alone.  His brother and his mother both have atrial fibrillation.  His brother has symptomatic paroxysmal atrial fibrillation.  Mr. Cody Mcconnell had no symptoms of palpitations or shortness of breath when he was in atrial fibrillation.  His activity level has increased since his back surgery.  He has not had any chest pain, shortness of breath, paroxysmal nocturnal dyspnea, leg swelling, syncope.    CHADS2-VASc=2 (age x 1, vascular dz).   Prior CV studies:   The following studies were reviewed today:  Chest CTA 06/01/18 - No significant imaging evidence of pulmonary embolic disease - A moderate burden of calcified atherosclerotic disease is evident within the coronary arterial distribution. - There is diffuse very mild thickening of the esophageal wall - No findings of trauma involving the thorax - No pneumothorax - Diffuse bronchial wall thickening is suggestive of infectious/inflammatory airway disease.  Carotid US 12/31/17 Final Interpretation: Right Carotid: Velocities in the right ICA are consistent with a 1-39% stenosis. Left Carotid:  Velocities in the left ICA are consistent with a 1-39% stenosis.       The extracranial vessels were near-normal with only minimal wall       thickening or plaque. Vertebrals: Bilateral vertebral arteries demonstrate antegrade flow. Subclavians: Normal flow hemodynamics were seen in bilateral subclavian       arteries.  GXT 12/31/17 ETT with good exercise tolerance (10:00); no chest pain; normal BP response; no diagnostic ST changes; negative adequate ETT.  Echo 12/31/17 EF 60-65, no RWMA, Gr 2 DD, mild AS (mean 11, peak 24)  Echo 07/30/16 EF 55-60, normal wall motion, normal diastolic function, mild aortic stenosis (mean 9, peak 16), mild LAE  Past Medical History:  Diagnosis Date  . Aortic valve stenosis 12/19/2017   Echo 07/30/16 - EF 55-60, normal wall motion, normal diastolic function, mild aortic stenosis (mean 9, peak 16), mild LAE // Echo 4/19:  EF 60-65, no RWMA, Gr 2 DD, mild AS (mean 11, peak 24)   . Benign prostate hyperplasia    unspecified whether lower urinary tract sym. present   . Carotid atherosclerosis    Carotid US 4/19:  bilat ICA 1-39; vertebrals and subclavians normal  . Glucose intolerance    "pre-diabetic"  . Hayfever   . History of exercise stress test    GXT 4/19:  ETT with good exercise tolerance (10:00); no chest pain; normal BP response; no diagnostic ST changes; negative adequate ETT.  . Prostatitis    unspecified prostatitis type  .  PSA elevation   . Pure hypercholesterolemia    Surgical Hx: The patient  has a past surgical history that includes Cervical spine surgery (2011); Colonoscopy; Lumbar disc surgery (2014); Shoulder arthroscopy; and Hip surgery.   Current Medications: Current Meds  Medication Sig  . apixaban (ELIQUIS) 5 MG TABS tablet Take 1 tablet (5 mg total) by mouth 2 (two) times daily.  . cetirizine (ZYRTEC) 10 MG tablet Take 10 mg by mouth every other day.  Marland Kitchen co-enzyme Q-10 30 MG capsule Take 30 mg by  mouth daily.  Marland Kitchen diltiazem (CARDIZEM) 30 MG tablet Take 30 mg by mouth 4 (four) times daily.  . fexofenadine (ALLEGRA ALLERGY) 180 MG tablet Take 180 mg by mouth every other day.  . fluticasone (FLONASE) 50 MCG/ACT nasal spray Place 1 spray into both nostrils daily.  . Multiple Vitamins-Minerals (MULTIVITAMIN ADULT PO) Take 1 tablet by mouth daily.  . Probiotic Product (PROBIOTIC DAILY PO) Take by mouth as directed.  . simvastatin (ZOCOR) 40 MG tablet Take 40 mg by mouth daily.  . TURMERIC PO Take 1 tablet by mouth daily.  . [DISCONTINUED] apixaban (ELIQUIS) 5 MG TABS tablet Take 5 mg by mouth 2 (two) times daily.     Allergies:   Patient has no known allergies.   Social History   Tobacco Use  . Smoking status: Former Smoker    Packs/day: 2.00    Years: 9.00    Pack years: 18.00  . Smokeless tobacco: Never Used  Substance Use Topics  . Alcohol use: Never    Frequency: Never  . Drug use: Never     Family Hx: The patient's family history includes CAD in his father; CAD (age of onset: 56) in his brother; Congestive Heart Failure in his father; Dementia in his mother.  ROS:   Please see the history of present illness.    ROS All other systems reviewed and are negative.   EKGs/Labs/Other Test Reviewed:    EKG:  EKG is   ordered today.  The ekg ordered today demonstrates NSR, HR 75, normal axis, QTc 419  Recent Labs: No results found for requested labs within last 8760 hours.   Recent Lipid Panel No results found for: CHOL, TRIG, HDL, CHOLHDL, LDLCALC, LDLDIRECT  Physical Exam:    VS:  BP 130/64   Pulse 76   Ht 5' 9.5" (1.765 m)   Wt 178 lb (80.7 kg)   SpO2 96%   BMI 25.91 kg/m     Wt Readings from Last 3 Encounters:  06/05/18 178 lb (80.7 kg)  04/06/18 180 lb 12.8 oz (82 kg)  01/25/18 174 lb (78.9 kg)     Physical Exam  Constitutional: He is oriented to person, place, and time. He appears well-developed and well-nourished. No distress.  HENT:  Head:  Normocephalic and atraumatic.  Eyes: No scleral icterus.  Neck: No JVD present. No thyromegaly present.  Cardiovascular: Normal rate and regular rhythm.  Murmur heard.  Crescendo-decrescendo systolic murmur is present with a grade of 2/6 at the upper right sternal border and upper left sternal border. Pulmonary/Chest: Effort normal. He has no rales.  Abdominal: Soft.  Musculoskeletal: He exhibits no edema.  Lymphadenopathy:    He has no cervical adenopathy.  Neurological: He is alert and oriented to person, place, and time.  Skin: Skin is warm and dry.  Psychiatric: He has a normal mood and affect.    ASSESSMENT & PLAN:    Paroxysmal atrial fibrillation ([place]) He was asymptomatic with  a HR in the 130s.  He has evidence of vascular disease by recent CT and prior carotid US.  He is also over the age of 29.  His CHADS2-VASc=2.  Therefore, he should remain on long term anticoagulation.  He did inquire about PVI ablation for atrial fibrillation.  I explained to him that this procedure would not negate his need for anticoagulation and it is usually reserved for patients who have refractory atrial fibrillation on antiarrhythmic drug therapy.  At this point, he would not be a candidate.  I did offer referring him to Dr. Rayann Heman if he would like to get a second opinion.  However, he preferred to hold off on this for now.    -Continue Apixaban 5 mg bid  -Change Diltiazem to CD 120 mg QD  -BMET, CBC in 4 weeks  -FU here in 3-4 months.   Coronary artery calcification seen on CT scan Stress test in 12/2017 was low risk.  He has not had any chest pain.  He is not on ASA any longer now that he is on Apixaban.  Continue statin.  Aortic valve stenosis, etiology of cardiac valve disease unspecified Mild by Echo in 12/2017.     Dispo:  Return in about 3 months (around 09/04/2018) for Routine Follow Up, w/ Dr. Burt Mcconnell.   Medication Adjustments/Labs and Tests Ordered: Current medicines are reviewed at  length with the patient today.  Concerns regarding medicines are outlined above.  Tests Ordered: Orders Placed This Encounter  Procedures  . Basic Metabolic Panel (BMET)  . CBC  . EKG 12-Lead   Medication Changes: Meds ordered this encounter  Medications  . diltiazem (CARDIZEM CD) 120 MG 24 hr capsule    Sig: Take 1 capsule (120 mg total) by mouth daily.    Dispense:  90 capsule    Refill:  3  . apixaban (ELIQUIS) 5 MG TABS tablet    Sig: Take 1 tablet (5 mg total) by mouth 2 (two) times daily.    Dispense:  180 tablet    Refill:  3    Signed, Richardson Dopp, PA-C  06/05/2018 1:06 PM    [facility] [address], [place], [place]  [address] Phone: [phone]; Fax: [phone]

## 2018-07-06 ENCOUNTER — Other Ambulatory Visit: Payer: Medicare Other | Admitting: *Deleted

## 2018-07-06 ENCOUNTER — Telehealth: Payer: Self-pay | Admitting: *Deleted

## 2018-07-06 ENCOUNTER — Encounter (INDEPENDENT_AMBULATORY_CARE_PROVIDER_SITE_OTHER): Payer: Self-pay

## 2018-07-06 DIAGNOSIS — I480 Paroxysmal atrial fibrillation: Secondary | ICD-10-CM | POA: Diagnosis not present

## 2018-07-06 DIAGNOSIS — I10 Essential (primary) hypertension: Secondary | ICD-10-CM | POA: Diagnosis not present

## 2018-07-06 DIAGNOSIS — M545 Low back pain: Secondary | ICD-10-CM | POA: Diagnosis not present

## 2018-07-06 DIAGNOSIS — I4891 Unspecified atrial fibrillation: Secondary | ICD-10-CM | POA: Diagnosis not present

## 2018-07-06 DIAGNOSIS — D500 Iron deficiency anemia secondary to blood loss (chronic): Secondary | ICD-10-CM | POA: Diagnosis not present

## 2018-07-06 LAB — BASIC METABOLIC PANEL
BUN/Creatinine Ratio: 16 (ref 10–24)
BUN: 16 mg/dL (ref 8–27)
CO2: 21 mmol/L (ref 20–29)
Calcium: 9.3 mg/dL (ref 8.6–10.2)
Chloride: 103 mmol/L (ref 96–106)
Creatinine, Ser: 1.02 mg/dL (ref 0.76–1.27)
GFR calc Af Amer: 85 mL/min/{1.73_m2} (ref >59)
GFR calc non Af Amer: 74 mL/min/{1.73_m2} (ref >59)
Glucose: 151 mg/dL — ABNORMAL HIGH (ref 65–99)
Potassium: 4.2 mmol/L (ref 3.5–5.2)
Sodium: 140 mmol/L (ref 134–144)

## 2018-07-06 LAB — CBC
Hematocrit: 42.6 % (ref 37.5–51.0)
Hemoglobin: 14 g/dL (ref 13.0–17.7)
MCH: 26.5 pg — ABNORMAL LOW (ref 26.6–33.0)
MCHC: 32.9 g/dL (ref 31.5–35.7)
MCV: 81 fL (ref 79–97)
Platelets: 266 10*3/uL (ref 150–450)
RBC: 5.29 x10E6/uL (ref 4.14–5.80)
RDW: 13.1 % (ref 12.3–15.4)
WBC: 4.8 10*3/uL (ref 3.4–10.8)

## 2018-07-06 NOTE — Telephone Encounter (Signed)
Pt has been notified of lab results by phone with verbal understanding. Pt states he will d/w PCP about his glucose level.

## 2018-07-06 NOTE — Telephone Encounter (Signed)
-----   Message from Liliane Shi, [place] sent at 07/06/2018  5:17 PM EDT ----- Renal function, hemoglobin, potassium normal. Glucose elevated.  Recommendations:  - Follow up with Primary Care for elevated sugar.  - otherwise, Continue current medications and follow up as planned.  Richardson Dopp, PA-C    07/06/2018 5:15 PM

## 2018-07-07 DIAGNOSIS — D649 Anemia, unspecified: Secondary | ICD-10-CM | POA: Diagnosis not present

## 2018-07-07 DIAGNOSIS — R739 Hyperglycemia, unspecified: Secondary | ICD-10-CM | POA: Diagnosis not present

## 2018-07-07 DIAGNOSIS — E7800 Pure hypercholesterolemia, unspecified: Secondary | ICD-10-CM | POA: Diagnosis not present

## 2018-07-09 DIAGNOSIS — M545 Low back pain: Secondary | ICD-10-CM | POA: Diagnosis not present

## 2018-07-09 DIAGNOSIS — M1611 Unilateral primary osteoarthritis, right hip: Secondary | ICD-10-CM | POA: Diagnosis not present

## 2018-07-09 DIAGNOSIS — M169 Osteoarthritis of hip, unspecified: Secondary | ICD-10-CM | POA: Diagnosis not present

## 2018-07-09 DIAGNOSIS — M1612 Unilateral primary osteoarthritis, left hip: Secondary | ICD-10-CM | POA: Diagnosis not present

## 2018-07-09 DIAGNOSIS — E7800 Pure hypercholesterolemia, unspecified: Secondary | ICD-10-CM | POA: Diagnosis not present

## 2018-07-09 NOTE — Telephone Encounter (Signed)
I responded to the patient.  He can hold Eliquis for 3 days prior to his injection and resume post op when safe.  If he needs a note sent to his surgeon, I can provide that.  If he wants to see Dr. Rayann Heman to discuss atrial fibrillation further, I can refer him.  Richardson Dopp, PA-C    07/09/2018 8:49 AM

## 2018-07-16 DIAGNOSIS — N419 Inflammatory disease of prostate, unspecified: Secondary | ICD-10-CM | POA: Diagnosis not present

## 2018-07-16 DIAGNOSIS — M25551 Pain in right hip: Secondary | ICD-10-CM | POA: Diagnosis not present

## 2018-07-16 DIAGNOSIS — M1611 Unilateral primary osteoarthritis, right hip: Secondary | ICD-10-CM | POA: Diagnosis not present

## 2018-07-16 DIAGNOSIS — E7800 Pure hypercholesterolemia, unspecified: Secondary | ICD-10-CM | POA: Diagnosis not present

## 2018-07-23 DIAGNOSIS — M4322 Fusion of spine, cervical region: Secondary | ICD-10-CM | POA: Diagnosis not present

## 2018-07-23 DIAGNOSIS — M5412 Radiculopathy, cervical region: Secondary | ICD-10-CM | POA: Diagnosis not present

## 2018-07-23 DIAGNOSIS — M5440 Lumbago with sciatica, unspecified side: Secondary | ICD-10-CM | POA: Diagnosis not present

## 2018-07-23 DIAGNOSIS — Z9889 Other specified postprocedural states: Secondary | ICD-10-CM | POA: Diagnosis not present

## 2018-07-23 DIAGNOSIS — M1611 Unilateral primary osteoarthritis, right hip: Secondary | ICD-10-CM | POA: Diagnosis not present

## 2018-08-03 ENCOUNTER — Telehealth: Payer: Self-pay | Admitting: *Deleted

## 2018-08-03 NOTE — Telephone Encounter (Signed)
   [facility] Pre-operative Risk Assessment    Request for surgical clearance:  1. What type of surgery is being performed? RIGHT TOTAL HIP   2. When is this surgery scheduled? 09/29/18   3. Are there any medications that need to be held prior to surgery and how long? ELIQUIS X 3 DAYS    4. Practice name and name of physician performing surgery? [facility]; DR. Alvan Dame   5. What is your office phone and fax number? PH# [phone]; FAX# [phone]    8. Anesthesia type (None, local, MAC, general) ? SPINAL    Julaine Hua 08/03/2018, 10:44 AM  _________________________________________________________________   (provider comments below)

## 2018-08-03 NOTE — Telephone Encounter (Signed)
Pt takes Eliquis for afib with CHADS2VASc score of (age, CAD). Renal function is normal. Ok to hold Eliquis for 3 days prior to THA per protocol.

## 2018-08-04 NOTE — Telephone Encounter (Addendum)
   Primary Cardiologist: Sherren Mocha, MD  Chart reviewed as part of pre-operative protocol coverage.   72 yo male with hx of coronary Ca on CT, mild aortic stenosis, parox AF on anticoagulation with Apixaban, hyperlipidemia.  GXT in 4/19 was neg for ischemic ECG changes.  Echo in 4/19 demonstrated normal EF, mod diastolic dysfunction, mean AV gradient 11 mmHg.  He was last seen by me 9/19.  RCRI:  0.4% CVRR anticoagulation review:  Ok to hold Apixaban for 3 days prior to procedure.   I have left a message for him to call back so that we can assess for any clinical change since his last visit.  Richardson Dopp, PA-C 08/04/2018, 3:41 PM

## 2018-08-10 ENCOUNTER — Telehealth: Payer: Self-pay | Admitting: Physician Assistant

## 2018-08-10 NOTE — Telephone Encounter (Signed)
New Message           Patient is returnig Pre-op call

## 2018-08-11 NOTE — Telephone Encounter (Signed)
F/ u message+    Patient returning call to pre-op

## 2018-08-11 NOTE — Telephone Encounter (Signed)
Duplicate

## 2018-08-13 NOTE — Telephone Encounter (Signed)
   Primary Cardiologist: Sherren Mocha, MD  Chart reviewed as part of pre-operative protocol coverage. Patient was contacted 08/13/2018 in reference to pre-operative risk assessment for pending surgery as outlined below.  Cody Mcconnell was last seen on 05/2018 by Richardson Dopp PA-C. H/o HLD, mild AS 12/2017, PAF, cor ca++ but low risk nuc in 12/2017. He affirms he is very active regularly without any anginal symptoms, SOB, palpations, syncope. Therefore, based on ACC/AHA guidelines, the patient would be at acceptable risk for the planned procedure without further cardiovascular testing.   Per pharmacist, "Pt takes Eliquis for afib with CHADS2VASc score of (age, CAD). Renal function is normal. Ok to hold Eliquis for 3 days prior to THA per protocol."  I will route this recommendation to the requesting party via [facility] fax function and remove from pre-op pool.  Please call with questions.  Charlie Pitter, PA-C 08/13/2018, 4:13 PM

## 2018-08-17 DIAGNOSIS — M169 Osteoarthritis of hip, unspecified: Secondary | ICD-10-CM | POA: Diagnosis not present

## 2018-08-17 DIAGNOSIS — I350 Nonrheumatic aortic (valve) stenosis: Secondary | ICD-10-CM | POA: Diagnosis not present

## 2018-08-17 DIAGNOSIS — E7800 Pure hypercholesterolemia, unspecified: Secondary | ICD-10-CM | POA: Diagnosis not present

## 2018-08-17 DIAGNOSIS — N400 Enlarged prostate without lower urinary tract symptoms: Secondary | ICD-10-CM | POA: Diagnosis not present

## 2018-08-17 DIAGNOSIS — Z01818 Encounter for other preprocedural examination: Secondary | ICD-10-CM | POA: Diagnosis not present

## 2018-08-18 DIAGNOSIS — L570 Actinic keratosis: Secondary | ICD-10-CM | POA: Diagnosis not present

## 2018-08-18 DIAGNOSIS — D225 Melanocytic nevi of trunk: Secondary | ICD-10-CM | POA: Diagnosis not present

## 2018-08-18 DIAGNOSIS — Z85828 Personal history of other malignant neoplasm of skin: Secondary | ICD-10-CM | POA: Diagnosis not present

## 2018-08-18 DIAGNOSIS — L814 Other melanin hyperpigmentation: Secondary | ICD-10-CM | POA: Diagnosis not present

## 2018-08-18 DIAGNOSIS — L821 Other seborrheic keratosis: Secondary | ICD-10-CM | POA: Diagnosis not present

## 2018-08-18 DIAGNOSIS — Z23 Encounter for immunization: Secondary | ICD-10-CM | POA: Diagnosis not present

## 2018-08-26 DIAGNOSIS — M25551 Pain in right hip: Secondary | ICD-10-CM | POA: Diagnosis not present

## 2018-08-26 DIAGNOSIS — M1611 Unilateral primary osteoarthritis, right hip: Secondary | ICD-10-CM | POA: Diagnosis not present

## 2018-09-21 DIAGNOSIS — M1611 Unilateral primary osteoarthritis, right hip: Secondary | ICD-10-CM | POA: Insufficient documentation

## 2018-09-21 HISTORY — DX: Unilateral primary osteoarthritis, right hip: M16.11

## 2018-09-21 NOTE — H&P (Signed)
TOTAL HIP ADMISSION H&P  Patient is admitted for right total hip arthroplasty, anterior approach.  Subjective:  Chief Complaint:   Right hip primary OA / pain  HPI: Cody Mcconnell, 73 y.o. male, has a history of pain and functional disability in the right hip(s) due to arthritis and patient has failed non-surgical conservative treatments for greater than 12 weeks to include analgesics and activity modification.  Onset of symptoms was gradual starting years ago with gradually worsening course since that time.The patient noted no past surgery on the right hip(s).  Patient currently rates pain in the right hip at 7 out of 10 with activity. Patient has worsening of pain with activity and weight bearing, trendelenberg gait, pain that interfers with activities of daily living and pain with passive range of motion. Patient has evidence of periarticular osteophytes and joint space narrowing by imaging studies. This condition presents safety issues increasing the risk of falls.  There is no current active infection.  Risks, benefits and expectations were discussed with the patient.  Risks including but not limited to the risk of anesthesia, blood clots, nerve damage, blood vessel damage, failure of the prosthesis, infection and up to and including death.  Patient understand the risks, benefits and expectations and wishes to proceed with surgery.   PCP: Cody Gravel, MD  D/C Plans:       Home  Post-op Meds:       No Rx given  Tranexamic Acid:      To be given - IV   Decadron:      Is to be given  FYI:      Eliquis   Norco  DME:   Rx given for - RW & 3-n-1  PT:   No PT   Patient Active Problem List   Diagnosis Date Noted  . Paroxysmal atrial fibrillation ([place]) 06/05/2018  . Coronary artery calcification seen on CT scan 06/05/2018  . Carotid atherosclerosis, bilateral 04/30/2018  . Prediabetes 04/30/2018  . Mild aortic stenosis 04/30/2018  . Acute right-sided low back pain with right-sided  sciatica 02/04/2018  . Lumbar herniated disc 01/30/2018  . Aortic valve stenosis 12/19/2017  . Encounter for screening colonoscopy 10/17/2017  . Benign prostatic hyperplasia with urinary obstruction 08/30/2016  . Elevated prostate specific antigen (PSA) 08/30/2016  . History of lumbar laminectomy for spinal cord decompression 11/30/2013  . S/P lumbar laminectomy 11/30/2013  . Spinal stenosis of lumbar region with neurogenic claudication 09/20/2013  . Right hip pain 09/20/2013  . Low back pain with sciatica 06/02/2012   Past Medical History:  Diagnosis Date  . Aortic valve stenosis 12/19/2017   Echo 07/30/16 - EF 55-60, normal wall motion, normal diastolic function, mild aortic stenosis (mean 9, peak 16), mild LAE // Echo 4/19:  EF 60-65, no RWMA, Gr 2 DD, mild AS (mean 11, peak 24)   . Benign prostate hyperplasia    unspecified whether lower urinary tract sym. present   . Carotid atherosclerosis    Carotid US 4/19:  bilat ICA 1-39; vertebrals and subclavians normal  . Glucose intolerance    "pre-diabetic"  . Hayfever   . History of exercise stress test    GXT 4/19:  ETT with good exercise tolerance (10:00); no chest pain; normal BP response; no diagnostic ST changes; negative adequate ETT.  . Prostatitis    unspecified prostatitis type  . PSA elevation   . Pure hypercholesterolemia     Past Surgical History:  Procedure Laterality Date  . CERVICAL SPINE  SURGERY  2011  . COLONOSCOPY    . HIP SURGERY     arthroscopy  . LUMBAR DISC SURGERY  2014   L2-3  . SHOULDER ARTHROSCOPY      No current facility-administered medications for this encounter.    Current Outpatient Medications  Medication Sig Dispense Refill Last Dose  . apixaban (ELIQUIS) 5 MG TABS tablet Take 1 tablet (5 mg total) by mouth 2 (two) times daily. 180 tablet 3   . cetirizine (ZYRTEC) 10 MG tablet Take 10 mg by mouth every other day.   Taking  . co-enzyme Q-10 30 MG capsule Take 30 mg by mouth daily.   Taking   . diltiazem (CARDIZEM CD) 120 MG 24 hr capsule Take 1 capsule (120 mg total) by mouth daily. 90 capsule 3   . diltiazem (CARDIZEM) 30 MG tablet Take 30 mg by mouth 4 (four) times daily.   Taking  . fexofenadine (ALLEGRA ALLERGY) 180 MG tablet Take 180 mg by mouth every other day.   Taking  . fluticasone (FLONASE) 50 MCG/ACT nasal spray Place 1 spray into both nostrils daily.   Taking  . Multiple Vitamins-Minerals (MULTIVITAMIN ADULT PO) Take 1 tablet by mouth daily.   Taking  . Probiotic Product (PROBIOTIC DAILY PO) Take by mouth as directed.   Taking  . simvastatin (ZOCOR) 40 MG tablet Take 40 mg by mouth daily.   Taking  . TURMERIC PO Take 1 tablet by mouth daily.   Taking   No Known Allergies   Social History   Tobacco Use  . Smoking status: Former Smoker    Packs/day: 2.00    Years: 9.00    Pack years: 18.00  . Smokeless tobacco: Never Used  Substance Use Topics  . Alcohol use: Never    Frequency: Never    Family History  Problem Relation Age of Onset  . Dementia Mother   . Congestive Heart Failure Father   . CAD Father        quad bypass at age 55  . CAD Brother 51     Review of Systems  Constitutional: Negative.   HENT: Negative.   Eyes: Negative.   Respiratory: Negative.   Cardiovascular: Negative.   Gastrointestinal: Negative.   Genitourinary: Negative.   Musculoskeletal: Positive for back pain and joint pain.  Skin: Negative.   Neurological: Negative.   Endo/Heme/Allergies: Positive for environmental allergies.  Psychiatric/Behavioral: Negative.     Objective:  Physical Exam  Constitutional: He is oriented to person, place, and time. He appears well-developed.  HENT:  Head: Normocephalic.  Eyes: Pupils are equal, round, and reactive to light.  Neck: Neck supple. No JVD present. No tracheal deviation present. No thyromegaly present.  Cardiovascular: Normal rate, regular rhythm and intact distal pulses.  Respiratory: Effort normal and breath sounds  normal. No respiratory distress. He has no wheezes.  GI: Soft. There is no abdominal tenderness. There is no guarding.  Musculoskeletal:     Right hip: He exhibits decreased range of motion, decreased strength, tenderness and bony tenderness. He exhibits no swelling, no deformity and no laceration.  Lymphadenopathy:    He has no cervical adenopathy.  Neurological: He is alert and oriented to person, place, and time.  Skin: Skin is warm and dry.  Psychiatric: He has a normal mood and affect.      Labs:  Estimated body mass index is 25.91 kg/m as calculated from the following:   Height as of 06/05/18: 5' 9.5" (1.765 m).  Weight as of 06/05/18: 80.7 kg.   Imaging Review Plain radiographs demonstrate severe degenerative joint disease of the right hip. The bone quality appears to be good for age and reported activity level.    Preoperative templating of the joint replacement has been completed, documented, and submitted to the Operating Room personnel in order to optimize intra-operative equipment management.     Assessment/Plan:  End stage arthritis, right hip  The patient history, physical examination, clinical judgement of the provider and imaging studies are consistent with end stage degenerative joint disease of the right hip and total hip arthroplasty is deemed medically necessary. The treatment options including medical management, injection therapy, arthroscopy and arthroplasty were discussed at length. The risks and benefits of total hip arthroplasty were presented and reviewed. The risks due to aseptic loosening, infection, stiffness, dislocation/subluxation,  thromboembolic complications and other imponderables were discussed.  The patient acknowledged the explanation, agreed to proceed with the plan and consent was signed. Patient is being admitted for inpatient treatment for surgery, pain control, PT, OT, prophylactic antibiotics, VTE prophylaxis, progressive ambulation and  ADL's and discharge planning.The patient is planning to be discharged home.     West Pugh Tamber Burtch   PA-C  09/21/2018, 9:41 AM

## 2018-09-22 ENCOUNTER — Other Ambulatory Visit (HOSPITAL_COMMUNITY): Payer: Self-pay | Admitting: *Deleted

## 2018-09-22 NOTE — Patient Instructions (Signed)
Cody Mcconnell  09/22/2018   Your procedure is scheduled on: 09-29-2018  Report to [facility] Main  Entrance  Report to admitting at 900 AM    Call this number if you have problems the morning of surgery [phone]   Remember: Do not eat food or drink liquids :After Midnight. BRUSH YOUR TEETH MORNING OF SURGERY AND RINSE YOUR MOUTH OUT, NO CHEWING GUM CANDY OR MINTS.     Take these medicines the morning of surgery with A SIP OF WATER: CERTRIZINE (ZYRTEC) OR FEXAFEXADINE (ALLEGRA), SIMVASTATIN , DILTIAZEM (CARDIZEM CD), FLONASE NASAL SPRAY                                You may not have any metal on your body including hair pins and              piercings  Do not wear jewelry, make-up, lotions, powders or perfumes, deodorant             Do not wear nail polish.  Do not shave  48 hours prior to surgery.              Men may shave face and neck.   Do not bring valuables to the hospital. [place].  Contacts, dentures or bridgework may not be worn into surgery.  Leave suitcase in the car. After surgery it may be brought to your room.                Please read over the following fact sheets you were given: _____________________________________________________________________             [facility] - Preparing for Surgery Before surgery, you can play an important role.  Because skin is not sterile, your skin needs to be as free of germs as possible.  You can reduce the number of germs on your skin by washing with CHG (chlorahexidine gluconate) soap before surgery.  CHG is an antiseptic cleaner which kills germs and bonds with the skin to continue killing germs even after washing. Please DO NOT use if you have an allergy to CHG or antibacterial soaps.  If your skin becomes reddened/irritated stop using the CHG and inform your nurse when you arrive at Short Stay. Do not shave (including legs and underarms) for  at least 48 hours prior to the first CHG shower.  You may shave your face/neck. Please follow these instructions carefully:  1.  Shower with CHG Soap the night before surgery and the  morning of Surgery.  2.  If you choose to wash your hair, wash your hair first as usual with your  normal  shampoo.  3.  After you shampoo, rinse your hair and body thoroughly to remove the  shampoo.                           4.  Use CHG as you would any other liquid soap.  You can apply chg directly  to the skin and wash                       Gently with a scrungie or clean washcloth.  5.  Apply the  CHG Soap to your body ONLY FROM THE NECK DOWN.   Do not use on face/ open                           Wound or open sores. Avoid contact with eyes, ears mouth and genitals (private parts).                       Wash face,  Genitals (private parts) with your normal soap.             6.  Wash thoroughly, paying special attention to the area where your surgery  will be performed.  7.  Thoroughly rinse your body with warm water from the neck down.  8.  DO NOT shower/wash with your normal soap after using and rinsing off  the CHG Soap.                9.  Pat yourself dry with a clean towel.            10.  Wear clean pajamas.            11.  Place clean sheets on your bed the night of your first shower and do not  sleep with pets. Day of Surgery : Do not apply any lotions/deodorants the morning of surgery.  Please wear clean clothes to the hospital/surgery center.  FAILURE TO FOLLOW THESE INSTRUCTIONS MAY RESULT IN THE CANCELLATION OF YOUR SURGERY PATIENT SIGNATURE_________________________________  NURSE SIGNATURE__________________________________  ________________________________________________________________________   Adam Phenix  An incentive spirometer is a tool that can help keep your lungs clear and active. This tool measures how well you are filling your lungs with each breath. Taking long deep  breaths may help reverse or decrease the chance of developing breathing (pulmonary) problems (especially infection) following:  A long period of time when you are unable to move or be active. BEFORE THE PROCEDURE   If the spirometer includes an indicator to show your best effort, your nurse or respiratory therapist will set it to a desired goal.  If possible, sit up straight or lean slightly forward. Try not to slouch.  Hold the incentive spirometer in an upright position. INSTRUCTIONS FOR USE  1. Sit on the edge of your bed if possible, or sit up as far as you can in bed or on a chair. 2. Hold the incentive spirometer in an upright position. 3. Breathe out normally. 4. Place the mouthpiece in your mouth and seal your lips tightly around it. 5. Breathe in slowly and as deeply as possible, raising the piston or the ball toward the top of the column. 6. Hold your breath for 3-5 seconds or for as long as possible. Allow the piston or ball to fall to the bottom of the column. 7. Remove the mouthpiece from your mouth and breathe out normally. 8. Rest for a few seconds and repeat Steps 1 through 7 at least 10 times every 1-2 hours when you are awake. Take your time and take a few normal breaths between deep breaths. 9. The spirometer may include an indicator to show your best effort. Use the indicator as a goal to work toward during each repetition. 10. After each set of 10 deep breaths, practice coughing to be sure your lungs are clear. If you have an incision (the cut made at the time of surgery), support your incision when coughing by placing a pillow or rolled up  towels firmly against it. Once you are able to get out of bed, walk around indoors and cough well. You may stop using the incentive spirometer when instructed by your caregiver.  RISKS AND COMPLICATIONS  Take your time so you do not get dizzy or light-headed.  If you are in pain, you may need to take or ask for pain medication before  doing incentive spirometry. It is harder to take a deep breath if you are having pain. AFTER USE  Rest and breathe slowly and easily.  It can be helpful to keep track of a log of your progress. Your caregiver can provide you with a simple table to help with this. If you are using the spirometer at home, follow these instructions: [place] IF:   You are having difficultly using the spirometer.  You have trouble using the spirometer as often as instructed.  Your pain medication is not giving enough relief while using the spirometer.  You develop fever of 100.5 F (38.1 C) or higher. SEEK IMMEDIATE MEDICAL CARE IF:   You cough up bloody sputum that had not been present before.  You develop fever of 102 F (38.9 C) or greater.  You develop worsening pain at or near the incision site. MAKE SURE YOU:   Understand these instructions.  Will watch your condition.  Will get help right away if you are not doing well or get worse. Document Released: 01/06/2007 Document Revised: 11/18/2011 Document Reviewed: 03/09/2007 Exit[employer] Patient Information 2014 ExitCare, [place].   ________________________________________________________________________  WHAT IS A BLOOD TRANSFUSION? Blood Transfusion Information  A transfusion is the replacement of blood or some of its parts. Blood is made up of multiple cells which provide different functions.  Red blood cells carry oxygen and are used for blood loss replacement.  White blood cells fight against infection.  Platelets control bleeding.  Plasma helps clot blood.  Other blood products are available for specialized needs, such as hemophilia or other clotting disorders. BEFORE THE TRANSFUSION  Who gives blood for transfusions?   Healthy volunteers who are fully evaluated to make sure their blood is safe. This is blood bank blood. Transfusion therapy is the safest it has ever been in the practice of medicine. Before blood is taken  from a donor, a complete history is taken to make sure that person has no history of diseases nor engages in risky social behavior (examples are intravenous drug use or sexual activity with multiple partners). The donor's travel history is screened to minimize risk of transmitting infections, such as malaria. The donated blood is tested for signs of infectious diseases, such as HIV and hepatitis. The blood is then tested to be sure it is compatible with you in order to minimize the chance of a transfusion reaction. If you or a relative donates blood, this is often done in anticipation of surgery and is not appropriate for emergency situations. It takes many days to process the donated blood. RISKS AND COMPLICATIONS Although transfusion therapy is very safe and saves many lives, the main dangers of transfusion include:   Getting an infectious disease.  Developing a transfusion reaction. This is an allergic reaction to something in the blood you were given. Every precaution is taken to prevent this. The decision to have a blood transfusion has been considered carefully by your caregiver before blood is given. Blood is not given unless the benefits outweigh the risks. AFTER THE TRANSFUSION  Right after receiving a blood transfusion, you will usually feel much  better and more energetic. This is especially true if your red blood cells have gotten low (anemic). The transfusion raises the level of the red blood cells which carry oxygen, and this usually causes an energy increase.  The nurse administering the transfusion will monitor you carefully for complications. HOME CARE INSTRUCTIONS  No special instructions are needed after a transfusion. You may find your energy is better. Speak with your caregiver about any limitations on activity for underlying diseases you may have. SEEK MEDICAL CARE IF:   Your condition is not improving after your transfusion.  You develop redness or irritation at the  intravenous (IV) site. SEEK IMMEDIATE MEDICAL CARE IF:  Any of the following symptoms occur over the next 12 hours:  Shaking chills.  You have a temperature by mouth above 102 F (38.9 C), not controlled by medicine.  Chest, back, or muscle pain.  People around you feel you are not acting correctly or are confused.  Shortness of breath or difficulty breathing.  Dizziness and fainting.  You get a rash or develop hives.  You have a decrease in urine output.  Your urine turns a dark color or changes to pink, red, or brown. Any of the following symptoms occur over the next 10 days:  You have a temperature by mouth above 102 F (38.9 C), not controlled by medicine.  Shortness of breath.  Weakness after normal activity.  The white part of the eye turns yellow (jaundice).  You have a decrease in the amount of urine or are urinating less often.  Your urine turns a dark color or changes to pink, red, or brown. Document Released: 08/23/2000 Document Revised: 11/18/2011 Document Reviewed: 04/11/2008 [facility] Patient Information 2014 [place], [place].  _______________________________________________________________________

## 2018-09-22 NOTE — Progress Notes (Signed)
CARDIAC CLEARANCE DAYNA DUNN PA 08-13-18 Epic AND ON CHART EKG 06-05-18 Epic CARDIOLOGY LOV SCOTT WEAVER PA 06-05-18 Epic VASCULAR US CAROTID DUPLEX 12-31-17 Epic EXERCISE TOLERANCE TEST 12-31-17 Epic ECHO 12-31-17 Epic

## 2018-09-23 ENCOUNTER — Other Ambulatory Visit: Payer: Self-pay

## 2018-09-23 ENCOUNTER — Encounter (HOSPITAL_COMMUNITY): Payer: Self-pay

## 2018-09-23 ENCOUNTER — Encounter (HOSPITAL_COMMUNITY)
Admission: RE | Admit: 2018-09-23 | Discharge: 2018-09-23 | Disposition: A | Discharge status: Home / Self Care | Payer: Medicare Other | Source: Ambulatory Visit | Attending: Orthopedic Surgery | Admitting: Orthopedic Surgery

## 2018-09-23 DIAGNOSIS — Z01812 Encounter for preprocedural laboratory examination: Secondary | ICD-10-CM | POA: Insufficient documentation

## 2018-09-23 DIAGNOSIS — M1611 Unilateral primary osteoarthritis, right hip: Secondary | ICD-10-CM | POA: Diagnosis not present

## 2018-09-23 HISTORY — DX: Unspecified hearing loss, unspecified ear: H91.90

## 2018-09-23 HISTORY — DX: Anemia, unspecified: D64.9

## 2018-09-23 HISTORY — DX: Cardiac arrhythmia, unspecified: I49.9

## 2018-09-23 HISTORY — DX: Anesthesia of skin: R20.0

## 2018-09-23 HISTORY — DX: Unspecified osteoarthritis, unspecified site: M19.90

## 2018-09-23 HISTORY — DX: Prediabetes: R73.03

## 2018-09-23 HISTORY — DX: Cardiac murmur, unspecified: R01.1

## 2018-09-23 LAB — HEMOGLOBIN A1C
Hgb A1c MFr Bld: 6.1 % — ABNORMAL HIGH (ref 4.8–5.6)
Mean Plasma Glucose: 128.37 mg/dL

## 2018-09-23 LAB — BASIC METABOLIC PANEL
Anion gap: 8 (ref 5–15)
BUN: 24 mg/dL — ABNORMAL HIGH (ref 8–23)
CO2: 23 mmol/L (ref 22–32)
Calcium: 9.3 mg/dL (ref 8.9–10.3)
Chloride: 108 mmol/L (ref 98–111)
Creatinine, Ser: 1.06 mg/dL (ref 0.61–1.24)
GFR calc Af Amer: >60 mL/min (ref >60)
GFR calc non Af Amer: >60 mL/min (ref >60)
Glucose, Bld: 106 mg/dL — ABNORMAL HIGH (ref 70–99)
Potassium: 4.3 mmol/L (ref 3.5–5.1)
Sodium: 139 mmol/L (ref 135–145)

## 2018-09-23 LAB — ABO/RH: ABO/RH(D): B POS

## 2018-09-23 LAB — CBC
HCT: 44.8 % (ref 39.0–52.0)
Hemoglobin: 14.4 g/dL (ref 13.0–17.0)
MCH: 27.1 pg (ref 26.0–34.0)
MCHC: 32.1 g/dL (ref 30.0–36.0)
MCV: 84.2 fL (ref 80.0–100.0)
Platelets: 271 10*3/uL (ref 150–400)
RBC: 5.32 MIL/uL (ref 4.22–5.81)
RDW: 13.2 % (ref 11.5–15.5)
WBC: 6.5 10*3/uL (ref 4.0–10.5)
nRBC: 0 % (ref 0.0–0.2)

## 2018-09-23 LAB — SURGICAL PCR SCREEN
MRSA, PCR: NEGATIVE
Staphylococcus aureus: NEGATIVE

## 2018-09-23 NOTE — Progress Notes (Signed)
PCP: Jani Gravel  CARDIOLOGIST: michale cooper  INFO IN Epic: cardaic clearance dayna dunn 08-13-18, lov scott weaver pa 06-05-18, nascular US carotid duplex 12-31-17, exercise tolerance test 01-01-28, echo 12-31-17  INFO ON CHART:  BLOOD THINNERS AND LAST DOSES: stop eliquis 3 days prior to surgery per [facility] ____________________________________  PATIENT SYMPTOMS AT TIME OF PREOP: none

## 2018-09-24 NOTE — Anesthesia Preprocedure Evaluation (Addendum)
Anesthesia Evaluation  Patient identified by MRN, date of birth, ID band Patient awake    Reviewed: Allergy & Precautions, NPO status , Patient's Chart, lab work & pertinent test results  Airway Mallampati: II  TM Distance: >3 FB Neck ROM: Full    Dental no notable dental hx.    Pulmonary neg pulmonary ROS, former smoker,    Pulmonary exam normal breath sounds clear to auscultation       Cardiovascular + CAD  Normal cardiovascular exam+ dysrhythmias Atrial Fibrillation + Valvular Problems/Murmurs AS  Rhythm:Regular Rate:Normal     Neuro/Psych negative neurological ROS  negative psych ROS   GI/Hepatic negative GI ROS, Neg liver ROS,   Endo/Other  negative endocrine ROS  Renal/GU negative Renal ROS  negative genitourinary   Musculoskeletal  (+) Arthritis , Osteoarthritis,    Abdominal   Peds negative pediatric ROS (+)  Hematology negative hematology ROS (+)   Anesthesia Other Findings   Reproductive/Obstetrics negative OB ROS                            Anesthesia Physical Anesthesia Plan  ASA: III  Anesthesia Plan: Spinal   Post-op Pain Management:    Induction: Intravenous  PONV Risk Score and Plan: 1 and Ondansetron  Airway Management Planned: Simple Face Mask  Additional Equipment:   Intra-op Plan:   Post-operative Plan:   Informed Consent: I have reviewed the patients History and Physical, chart, labs and discussed the procedure including the risks, benefits and alternatives for the proposed anesthesia with the patient or authorized representative who has indicated his/her understanding and acceptance.     Dental advisory given  Plan Discussed with: CRNA  Anesthesia Plan Comments: (See PST note 09/23/18, Konrad Felix, PA-C)       Anesthesia Quick Evaluation

## 2018-09-24 NOTE — Progress Notes (Signed)
Anesthesia Chart Review   Case:  [id number] Date/Time:  09/29/18 1115   Procedure:  TOTAL HIP ARTHROPLASTY ANTERIOR APPROACH (Right ) - 70   Anesthesia type:  Spinal   Pre-op diagnosis:  Right Hip Osteoarthritis   Location:  [place] [facility] / [facility]   Surgeon:  Paralee Cancel, MD      DISCUSSION: 73 yo former smoker (18 pack years) with h/o BPH, CAD, A-fib (on Eliquis), AS, HOH (bilaterally) scheduled for above surgery on 09/29/18 with Dr. Paralee Cancel.   Pt is followed by cardiology.  Per note by Melina Copa, PA-C, "Cody Mcconnell was last seen on 05/2018 by Richardson Dopp PA-C. H/o HLD, mild AS 12/2017, PAF, cor ca++ but low risk nuc in 12/2017. He affirms he is very active regularly without any anginal symptoms, SOB, palpations, syncope. Therefore, based on ACC/AHA guidelines, the patient would be at acceptable risk for the planned procedure without further cardiovascular testing. Per pharmacist, Pt takes Eliquis for afib with CHADS2VASc score of (age, CAD). Renal function is normal. Ok to hold Eliquis for 3 days prior to THA per protocol."  Echo 12/31/17 with EF 60-65%, mild aortic stenosis. Low risk stress test 12/2017.   Pt can proceed with planned procedure barring acute status change.  VS: BP (!) 147/77   Pulse 62   Temp 36.6 C   Ht 5' 8.5" (1.74 m)   Wt 80.7 kg   SpO2 99%   BMI 26.67 kg/m   PROVIDERS: Jani Gravel, MD is PCP    Sherren Mocha, MD is Cardiologist  LABS: Labs reviewed: Acceptable for surgery. (all labs ordered are listed, but only abnormal results are displayed)  Labs Reviewed  BASIC METABOLIC PANEL - Abnormal; Notable for the following components:      Result Value   Glucose, Bld 106 (*)    BUN 24 (*)    All other components within normal limits  HEMOGLOBIN A1C - Abnormal; Notable for the following components:   Hgb A1c MFr Bld 6.1 (*)    All other components within normal limits  SURGICAL PCR SCREEN  CBC  TYPE AND SCREEN      IMAGES:   EKG: 06/05/18 Rate 75bpm Normal sinus rhythm Normal ECG  CV: Echo 12/31/17 Study Conclusions  - Left ventricle: The cavity size was normal. Wall thickness was   normal. Systolic function was normal. The estimated ejection   fraction was in the range of 60% to 65%. Wall motion was normal;   there were no regional wall motion abnormalities. Features are   consistent with a pseudonormal left ventricular filling pattern,   with concomitant abnormal relaxation and increased filling   pressure (grade 2 diastolic dysfunction). - Aortic valve: There was mild stenosis. Mean gradient (S): 11 mm   Hg. Peak gradient (S): 24 mm Hg.  Stress Test 12/31/17  Blood pressure demonstrated a normal response to exercise.   ETT with good exercise tolerance (10:00); no chest pain; normal BP response; no diagnostic ST changes; negative adequate ETT.  Carotid doppler 12/31/17 Final Interpretation: Right Carotid: Velocities in the right ICA are consistent with a 1-39% stenosis.  Left Carotid: Velocities in the left ICA are consistent with a 1-39% stenosis.               The extracranial vessels were near-normal with only minimal wall               thickening or plaque.  Vertebrals:  Bilateral vertebral arteries demonstrate antegrade flow.  Subclavians: Normal flow hemodynamics were seen in bilateral subclavian              arteries. Past Medical History:  Diagnosis Date  . Anemia    SLIGHT ANEMIA 4 MONTHS AGO  . Aortic valve stenosis 12/19/2017   Echo 07/30/16 - EF 55-60, normal wall motion, normal diastolic function, mild aortic stenosis (mean 9, peak 16), mild LAE // Echo 4/19:  EF 60-65, no RWMA, Gr 2 DD, mild AS (mean 11, peak 24)   . Arthritis    OA  . Benign prostate hyperplasia    unspecified whether lower urinary tract sym. present   . Carotid atherosclerosis    Carotid US 4/19:  bilat ICA 1-39; vertebrals and subclavians normal  . Dysrhythmia    ATRIAL FIB  .  Glucose intolerance    "pre-diabetic"  . Hayfever   . Heart murmur   . History of exercise stress test    GXT 4/19:  ETT with good exercise tolerance (10:00); no chest pain; normal BP response; no diagnostic ST changes; negative adequate ETT.  Marland Kitchen HOH (hard of hearing)    BOTH EARS  . Numbness of right thumb    FROM CERVICAL NECK SURGERY  . Pre-diabetes   . Prostatitis    unspecified prostatitis type  . PSA elevation   . Pure hypercholesterolemia     Past Surgical History:  Procedure Laterality Date  . CERVICAL SPINE SURGERY  2011  . COLONOSCOPY    . HIP SURGERY Right    arthroscopy  . LUMBAR DISC SURGERY  2014   L2-3, L3 TO L4 X 2 2019 AT [facility]  . SHOULDER ARTHROSCOPY Right     MEDICATIONS: . apixaban (ELIQUIS) 5 MG TABS tablet  . cetirizine (ZYRTEC) 10 MG tablet  . diltiazem (CARDIZEM CD) 120 MG 24 hr capsule  . fexofenadine (ALLEGRA ALLERGY) 180 MG tablet  . fluticasone (FLONASE) 50 MCG/ACT nasal spray  . Multiple Vitamins-Minerals (MULTIVITAMIN ADULT PO)  . Probiotic Product (PROBIOTIC DAILY PO)  . simvastatin (ZOCOR) 40 MG tablet  . TURMERIC PO   No current facility-administered medications for this encounter.    Maia Plan [facility] Pre-Surgical Testing  [phone] 09/24/18 11:57 AM

## 2018-09-29 ENCOUNTER — Inpatient Hospital Stay (HOSPITAL_COMMUNITY): Payer: Medicare Other

## 2018-09-29 ENCOUNTER — Observation Stay (HOSPITAL_COMMUNITY)
Admission: RE | Admit: 2018-09-29 | Discharge: 2018-09-30 | Disposition: A | Discharge status: Home / Self Care | Payer: Medicare Other | Source: Ambulatory Visit | Attending: Orthopedic Surgery | Admitting: Orthopedic Surgery

## 2018-09-29 ENCOUNTER — Inpatient Hospital Stay (HOSPITAL_COMMUNITY): Payer: Medicare Other | Admitting: Certified Registered Nurse Anesthetist

## 2018-09-29 ENCOUNTER — Other Ambulatory Visit: Payer: Self-pay

## 2018-09-29 ENCOUNTER — Observation Stay (HOSPITAL_COMMUNITY): Payer: Medicare Other

## 2018-09-29 ENCOUNTER — Inpatient Hospital Stay (HOSPITAL_COMMUNITY): Payer: Medicare Other | Admitting: Physician Assistant

## 2018-09-29 ENCOUNTER — Encounter (HOSPITAL_COMMUNITY)
Admission: RE | Disposition: A | Discharge status: Home / Self Care | Payer: Self-pay | Source: Ambulatory Visit | Attending: Orthopedic Surgery

## 2018-09-29 ENCOUNTER — Encounter (HOSPITAL_COMMUNITY): Payer: Self-pay | Admitting: Emergency Medicine

## 2018-09-29 DIAGNOSIS — I480 Paroxysmal atrial fibrillation: Secondary | ICD-10-CM | POA: Diagnosis not present

## 2018-09-29 DIAGNOSIS — Z79899 Other long term (current) drug therapy: Secondary | ICD-10-CM | POA: Diagnosis not present

## 2018-09-29 DIAGNOSIS — Z87891 Personal history of nicotine dependence: Secondary | ICD-10-CM | POA: Insufficient documentation

## 2018-09-29 DIAGNOSIS — I2510 Atherosclerotic heart disease of native coronary artery without angina pectoris: Secondary | ICD-10-CM | POA: Diagnosis not present

## 2018-09-29 DIAGNOSIS — M1611 Unilateral primary osteoarthritis, right hip: Secondary | ICD-10-CM | POA: Diagnosis not present

## 2018-09-29 DIAGNOSIS — I2584 Coronary atherosclerosis due to calcified coronary lesion: Secondary | ICD-10-CM | POA: Insufficient documentation

## 2018-09-29 DIAGNOSIS — E663 Overweight: Secondary | ICD-10-CM | POA: Diagnosis present

## 2018-09-29 DIAGNOSIS — Z6826 Body mass index (BMI) 26.0-26.9, adult: Secondary | ICD-10-CM | POA: Diagnosis not present

## 2018-09-29 DIAGNOSIS — Z7901 Long term (current) use of anticoagulants: Secondary | ICD-10-CM | POA: Insufficient documentation

## 2018-09-29 DIAGNOSIS — E7800 Pure hypercholesterolemia, unspecified: Secondary | ICD-10-CM | POA: Insufficient documentation

## 2018-09-29 DIAGNOSIS — Z96641 Presence of right artificial hip joint: Secondary | ICD-10-CM

## 2018-09-29 DIAGNOSIS — Z96649 Presence of unspecified artificial hip joint: Secondary | ICD-10-CM

## 2018-09-29 DIAGNOSIS — Z419 Encounter for procedure for purposes other than remedying health state, unspecified: Secondary | ICD-10-CM

## 2018-09-29 DIAGNOSIS — Z471 Aftercare following joint replacement surgery: Secondary | ICD-10-CM | POA: Diagnosis not present

## 2018-09-29 HISTORY — PX: TOTAL HIP ARTHROPLASTY: SHX124

## 2018-09-29 LAB — TYPE AND SCREEN
ABO/RH(D): B POS
Antibody Screen: NEGATIVE

## 2018-09-29 IMAGING — RF DG HIP (WITH OR WITHOUT PELVIS) 1V PORT*R*
1 series · 2 of 2 positions shown · non-contrast
Comparison: [DATE]

CLINICAL DATA: Intraoperative right hip replacement.

EXAM:
DG HIP (WITH OR WITHOUT PELVIS) 1V PORT RIGHT; DG C-ARM 1-60 MIN-NO
REPORT fluoroscopic time 23 seconds.

[Series 1: run · 2 of 2 slices shown]
[im 1/2]
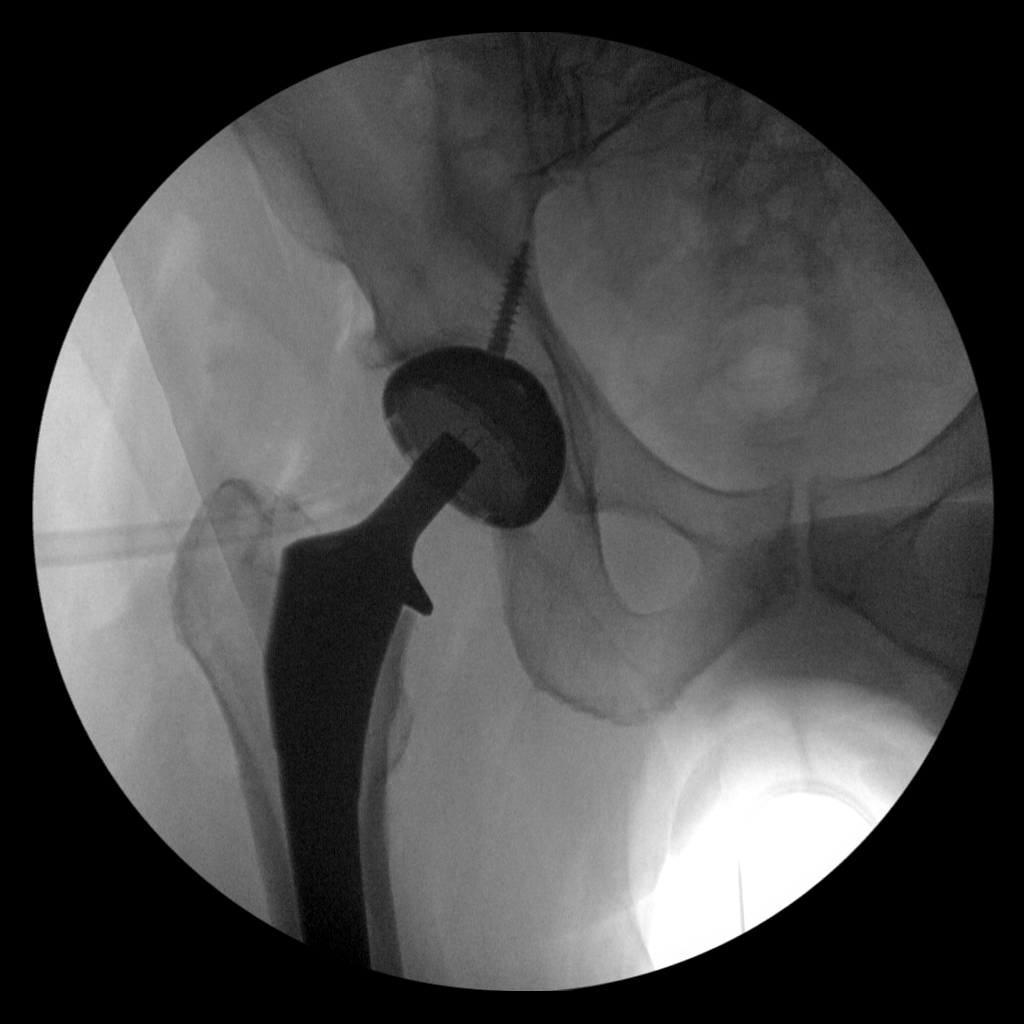
[im 2/2]
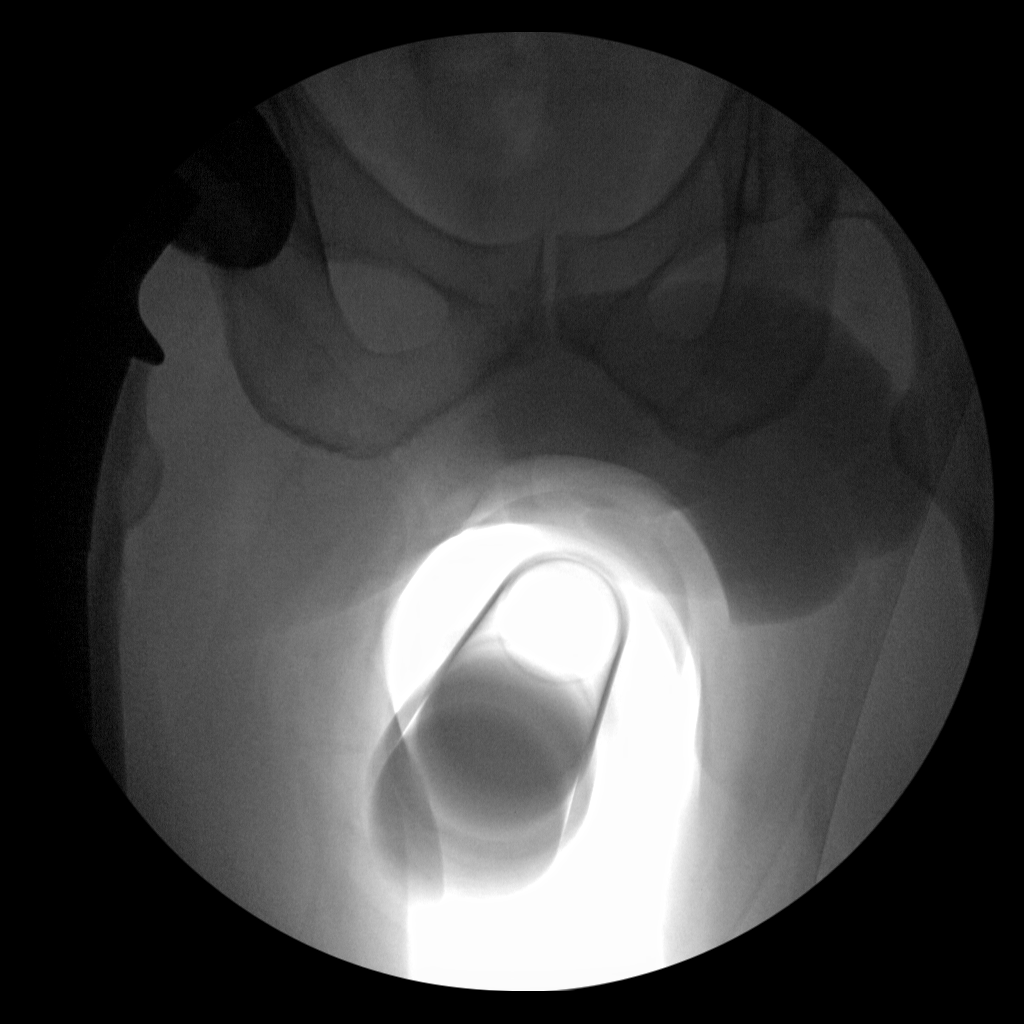

[2 of 2 positions shown; findings below may reference images not displayed]

FINDINGS: Right hip replacement is identified without malalignment.
IMPRESSION: Right hip replacement is identified without malalignment.

## 2018-09-29 IMAGING — DX DG PORTABLE PELVIS
1 series · 1 of 1 positions shown · non-contrast
Comparison: None.

CLINICAL DATA: Status post RIGHT hip replacement.

EXAM:
PORTABLE PELVIS 1-2 VIEWS

[pelvis lat]
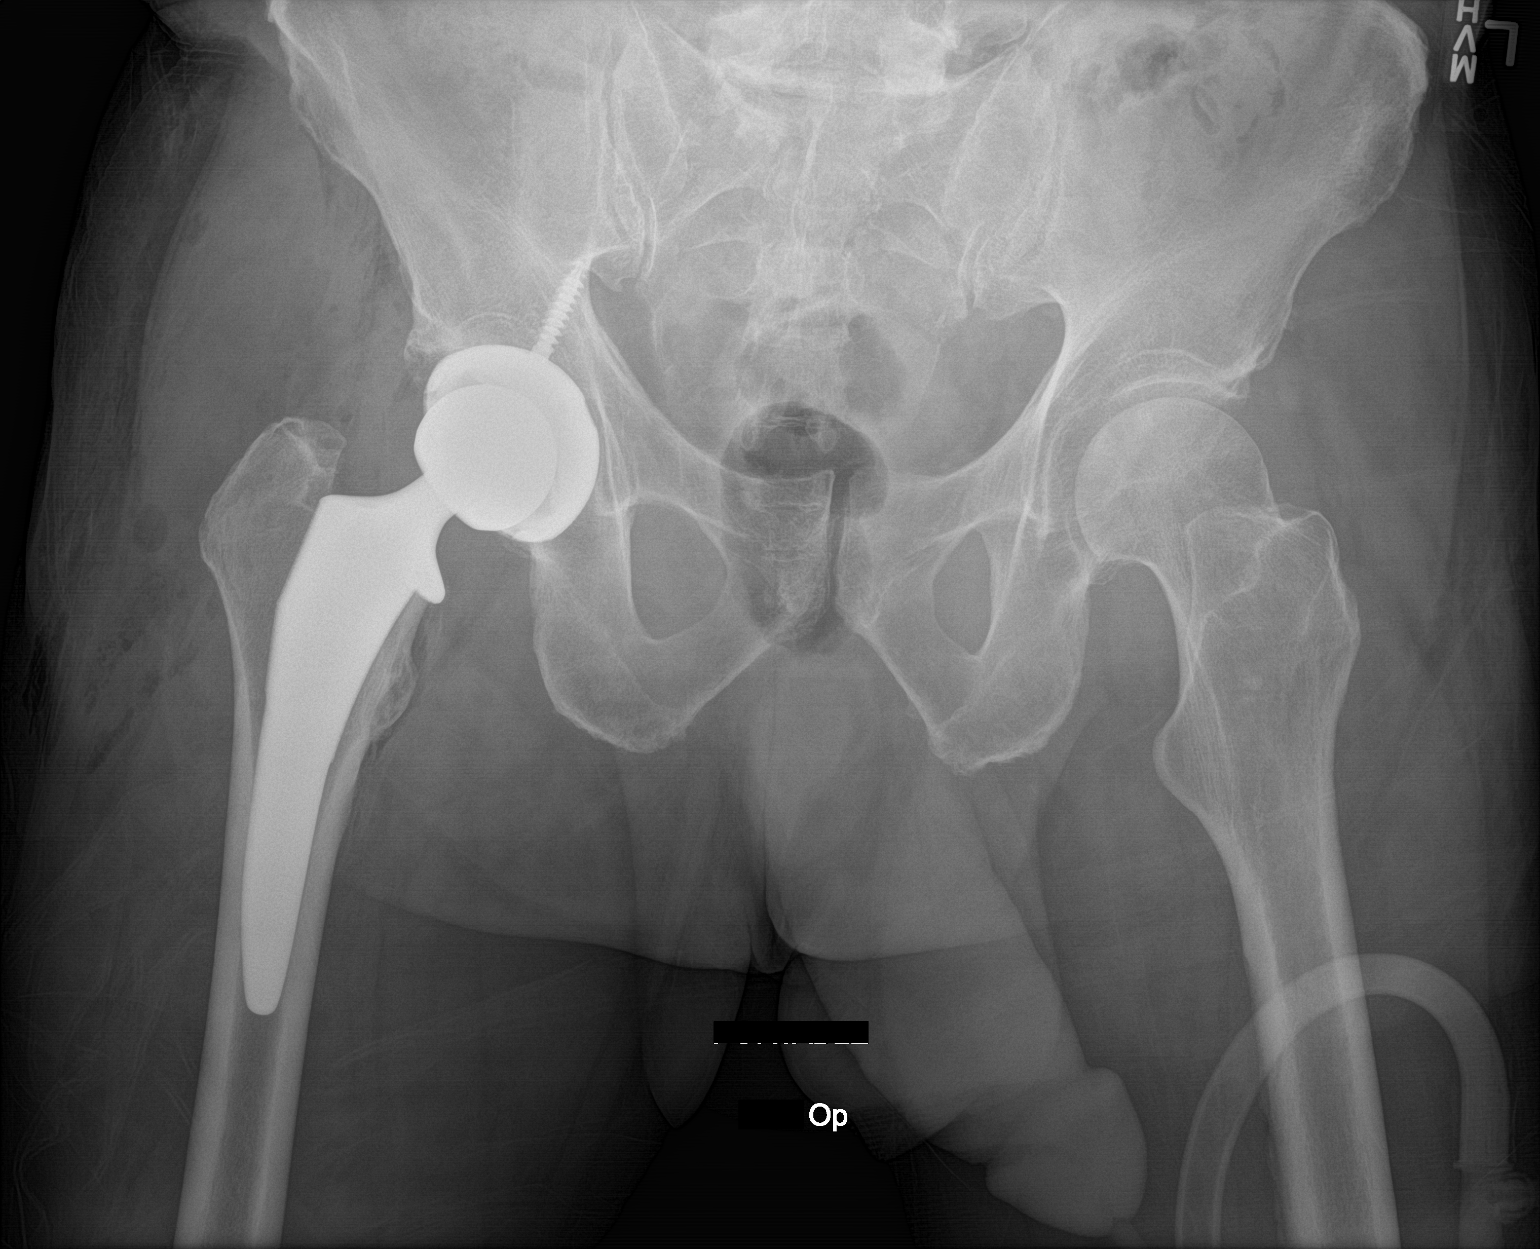

[1 of 1 positions shown; findings below may reference images not displayed]

FINDINGS: RIGHT total hip arthroplasty identified without complicating
features. No dislocation on this single view.
IMPRESSION: RIGHT total hip arthroplasty without complicating features.

## 2018-09-29 SURGERY — ARTHROPLASTY, HIP, TOTAL, ANTERIOR APPROACH
Anesthesia: Spinal | Site: Hip | Laterality: Right

## 2018-09-29 MED ORDER — DOCUSATE SODIUM 100 MG PO CAPS
100.0000 mg | ORAL_CAPSULE | Freq: Two times a day (BID) | ORAL | Status: DC
  Administered 2018-09-29 – 2018-09-30 (×2): 100 mg via ORAL
  Filled 2018-09-29 (×2): qty 1

## 2018-09-29 MED ORDER — PROPOFOL 10 MG/ML IV BOLUS
INTRAVENOUS | Status: AC
  Filled 2018-09-29: qty 40

## 2018-09-29 MED ORDER — FLUTICASONE PROPIONATE 50 MCG/ACT NA SUSP
1.0000 | Freq: Two times a day (BID) | NASAL | Status: DC
  Filled 2018-09-29: qty 16

## 2018-09-29 MED ORDER — ONDANSETRON HCL 4 MG/2ML IJ SOLN
INTRAMUSCULAR | Status: AC
  Filled 2018-09-29: qty 2

## 2018-09-29 MED ORDER — DILTIAZEM HCL ER COATED BEADS 120 MG PO CP24
120.0000 mg | ORAL_CAPSULE | Freq: Every day | ORAL | Status: DC
  Administered 2018-09-30: 120 mg via ORAL
  Filled 2018-09-29: qty 1

## 2018-09-29 MED ORDER — ONDANSETRON HCL 4 MG/2ML IJ SOLN: 4.0000 mg | Freq: Four times a day (QID) | INTRAMUSCULAR | Status: DC | PRN

## 2018-09-29 MED ORDER — MENTHOL 3 MG MT LOZG: 1.0000 | LOZENGE | OROMUCOSAL | Status: DC | PRN

## 2018-09-29 MED ORDER — METHOCARBAMOL 500 MG IVPB - SIMPLE MED
500.0000 mg | Freq: Four times a day (QID) | INTRAVENOUS | Status: DC | PRN
  Filled 2018-09-29: qty 50

## 2018-09-29 MED ORDER — POLYETHYLENE GLYCOL 3350 17 G PO PACK: 17.0000 g | PACK | Freq: Two times a day (BID) | ORAL | 0 refills | Status: DC

## 2018-09-29 MED ORDER — HYDROCODONE-ACETAMINOPHEN 5-325 MG PO TABS
1.0000 | ORAL_TABLET | ORAL | Status: DC | PRN
  Administered 2018-09-29 – 2018-09-30 (×2): 2 via ORAL
  Filled 2018-09-29 (×2): qty 2

## 2018-09-29 MED ORDER — HYDROMORPHONE HCL 1 MG/ML IJ SOLN: 0.5000 mg | INTRAMUSCULAR | Status: DC | PRN

## 2018-09-29 MED ORDER — PHENYLEPHRINE 40 MCG/ML (10ML) SYRINGE FOR IV PUSH (FOR BLOOD PRESSURE SUPPORT)
PREFILLED_SYRINGE | INTRAVENOUS | Status: AC
  Filled 2018-09-29: qty 10

## 2018-09-29 MED ORDER — METOCLOPRAMIDE HCL 5 MG PO TABS: 5.0000 mg | ORAL_TABLET | Freq: Three times a day (TID) | ORAL | Status: DC | PRN

## 2018-09-29 MED ORDER — DEXAMETHASONE SODIUM PHOSPHATE 10 MG/ML IJ SOLN
10.0000 mg | Freq: Once | INTRAMUSCULAR | Status: AC
  Administered 2018-09-29: 10 mg via INTRAVENOUS

## 2018-09-29 MED ORDER — ONDANSETRON HCL 4 MG/2ML IJ SOLN
INTRAMUSCULAR | Status: DC | PRN
  Administered 2018-09-29: 4 mg via INTRAVENOUS

## 2018-09-29 MED ORDER — BUPIVACAINE IN DEXTROSE 0.75-8.25 % IT SOLN
INTRATHECAL | Status: DC | PRN
  Administered 2018-09-29: 2 mL via INTRATHECAL

## 2018-09-29 MED ORDER — MIDAZOLAM HCL 5 MG/5ML IJ SOLN
INTRAMUSCULAR | Status: DC | PRN
  Administered 2018-09-29: 2 mg via INTRAVENOUS

## 2018-09-29 MED ORDER — DIPHENHYDRAMINE HCL 12.5 MG/5ML PO ELIX
12.5000 mg | ORAL_SOLUTION | ORAL | Status: DC | PRN
  Administered 2018-09-30: 25 mg via ORAL
  Filled 2018-09-29: qty 10

## 2018-09-29 MED ORDER — HYDROCODONE-ACETAMINOPHEN 7.5-325 MG PO TABS: 1.0000 | ORAL_TABLET | ORAL | 0 refills | Status: DC | PRN

## 2018-09-29 MED ORDER — TRANEXAMIC ACID-NACL 1000-0.7 MG/100ML-% IV SOLN
1000.0000 mg | Freq: Once | INTRAVENOUS | Status: AC
  Administered 2018-09-29: 1000 mg via INTRAVENOUS
  Filled 2018-09-29: qty 100

## 2018-09-29 MED ORDER — FERROUS SULFATE 325 (65 FE) MG PO TABS: 325.0000 mg | ORAL_TABLET | Freq: Three times a day (TID) | ORAL | 3 refills | Status: DC

## 2018-09-29 MED ORDER — 0.9 % SODIUM CHLORIDE (POUR BTL) OPTIME
TOPICAL | Status: DC | PRN
  Administered 2018-09-29: 1000 mL

## 2018-09-29 MED ORDER — HYDROCODONE-ACETAMINOPHEN 7.5-325 MG PO TABS
1.0000 | ORAL_TABLET | ORAL | Status: DC | PRN
  Administered 2018-09-29 (×2): 1 via ORAL
  Filled 2018-09-29 (×2): qty 1

## 2018-09-29 MED ORDER — ALBUMIN HUMAN 5 % IV SOLN
INTRAVENOUS | Status: DC | PRN
  Administered 2018-09-29 (×2): via INTRAVENOUS

## 2018-09-29 MED ORDER — CEFAZOLIN SODIUM-DEXTROSE 2-4 GM/100ML-% IV SOLN
INTRAVENOUS | Status: AC
  Filled 2018-09-29: qty 100

## 2018-09-29 MED ORDER — CHLORHEXIDINE GLUCONATE 4 % EX LIQD: 60.0000 mL | Freq: Once | CUTANEOUS | Status: DC

## 2018-09-29 MED ORDER — CELECOXIB 200 MG PO CAPS
200.0000 mg | ORAL_CAPSULE | Freq: Two times a day (BID) | ORAL | Status: DC
  Administered 2018-09-29 – 2018-09-30 (×2): 200 mg via ORAL
  Filled 2018-09-29 (×2): qty 1

## 2018-09-29 MED ORDER — ALBUMIN HUMAN 5 % IV SOLN
INTRAVENOUS | Status: AC
  Filled 2018-09-29: qty 250

## 2018-09-29 MED ORDER — POLYETHYLENE GLYCOL 3350 17 G PO PACK
17.0000 g | PACK | Freq: Two times a day (BID) | ORAL | Status: DC
  Administered 2018-09-29 – 2018-09-30 (×2): 17 g via ORAL
  Filled 2018-09-29 (×2): qty 1

## 2018-09-29 MED ORDER — APIXABAN 2.5 MG PO TABS
2.5000 mg | ORAL_TABLET | Freq: Two times a day (BID) | ORAL | Status: DC
  Administered 2018-09-30: 2.5 mg via ORAL
  Filled 2018-09-29: qty 1

## 2018-09-29 MED ORDER — METHOCARBAMOL 500 MG PO TABS: 500.0000 mg | ORAL_TABLET | Freq: Four times a day (QID) | ORAL | 0 refills | Status: DC | PRN

## 2018-09-29 MED ORDER — DEXAMETHASONE SODIUM PHOSPHATE 10 MG/ML IJ SOLN
10.0000 mg | Freq: Once | INTRAMUSCULAR | Status: AC
  Administered 2018-09-30: 10 mg via INTRAVENOUS
  Filled 2018-09-29: qty 1

## 2018-09-29 MED ORDER — LACTATED RINGERS IV SOLN
INTRAVENOUS | Status: DC
  Administered 2018-09-29 (×2): via INTRAVENOUS

## 2018-09-29 MED ORDER — PROPOFOL 10 MG/ML IV BOLUS
INTRAVENOUS | Status: AC
  Filled 2018-09-29: qty 20

## 2018-09-29 MED ORDER — SIMVASTATIN 40 MG PO TABS: 40.0000 mg | ORAL_TABLET | Freq: Every day | ORAL | Status: DC

## 2018-09-29 MED ORDER — DOCUSATE SODIUM 100 MG PO CAPS: 100.0000 mg | ORAL_CAPSULE | Freq: Two times a day (BID) | ORAL | 0 refills | Status: DC

## 2018-09-29 MED ORDER — OXYCODONE HCL 5 MG/5ML PO SOLN
5.0000 mg | Freq: Once | ORAL | Status: DC | PRN
  Filled 2018-09-29: qty 5

## 2018-09-29 MED ORDER — LORATADINE 10 MG PO TABS
10.0000 mg | ORAL_TABLET | Freq: Every day | ORAL | Status: DC
  Administered 2018-09-30: 10 mg via ORAL
  Filled 2018-09-29: qty 1

## 2018-09-29 MED ORDER — METHOCARBAMOL 500 MG PO TABS: 500.0000 mg | ORAL_TABLET | Freq: Four times a day (QID) | ORAL | Status: DC | PRN

## 2018-09-29 MED ORDER — BISACODYL 10 MG RE SUPP: 10.0000 mg | Freq: Every day | RECTAL | Status: DC | PRN

## 2018-09-29 MED ORDER — ONDANSETRON HCL 4 MG PO TABS: 4.0000 mg | ORAL_TABLET | Freq: Four times a day (QID) | ORAL | Status: DC | PRN

## 2018-09-29 MED ORDER — ALUM & MAG HYDROXIDE-SIMETH 200-200-20 MG/5ML PO SUSP: 15.0000 mL | ORAL | Status: DC | PRN

## 2018-09-29 MED ORDER — PROMETHAZINE HCL 25 MG/ML IJ SOLN: 6.2500 mg | INTRAMUSCULAR | Status: DC | PRN

## 2018-09-29 MED ORDER — ACETAMINOPHEN 325 MG PO TABS: 325.0000 mg | ORAL_TABLET | Freq: Four times a day (QID) | ORAL | Status: DC | PRN

## 2018-09-29 MED ORDER — MAGNESIUM CITRATE PO SOLN: 1.0000 | Freq: Once | ORAL | Status: DC | PRN

## 2018-09-29 MED ORDER — OXYCODONE HCL 5 MG PO TABS: 5.0000 mg | ORAL_TABLET | Freq: Once | ORAL | Status: DC | PRN

## 2018-09-29 MED ORDER — TRANEXAMIC ACID-NACL 1000-0.7 MG/100ML-% IV SOLN
INTRAVENOUS | Status: AC
  Filled 2018-09-29: qty 100

## 2018-09-29 MED ORDER — CEFAZOLIN SODIUM-DEXTROSE 2-4 GM/100ML-% IV SOLN
2.0000 g | Freq: Four times a day (QID) | INTRAVENOUS | Status: AC
  Administered 2018-09-29 (×2): 2 g via INTRAVENOUS
  Filled 2018-09-29 (×2): qty 100

## 2018-09-29 MED ORDER — HYDROMORPHONE HCL 1 MG/ML IJ SOLN: 0.2500 mg | INTRAMUSCULAR | Status: DC | PRN

## 2018-09-29 MED ORDER — PROPOFOL 500 MG/50ML IV EMUL
INTRAVENOUS | Status: DC | PRN
  Administered 2018-09-29: 50 ug/kg/min via INTRAVENOUS

## 2018-09-29 MED ORDER — FENTANYL CITRATE (PF) 100 MCG/2ML IJ SOLN
INTRAMUSCULAR | Status: AC
  Filled 2018-09-29: qty 2

## 2018-09-29 MED ORDER — PROPOFOL 10 MG/ML IV BOLUS
INTRAVENOUS | Status: DC | PRN
  Administered 2018-09-29: 20 mg via INTRAVENOUS

## 2018-09-29 MED ORDER — SODIUM CHLORIDE 0.9 % IV SOLN
INTRAVENOUS | Status: DC
  Administered 2018-09-29 – 2018-09-30 (×2): via INTRAVENOUS

## 2018-09-29 MED ORDER — PHENOL 1.4 % MT LIQD
1.0000 | OROMUCOSAL | Status: DC | PRN
  Filled 2018-09-29: qty 177

## 2018-09-29 MED ORDER — DEXAMETHASONE SODIUM PHOSPHATE 10 MG/ML IJ SOLN
INTRAMUSCULAR | Status: AC
  Filled 2018-09-29: qty 1

## 2018-09-29 MED ORDER — TRANEXAMIC ACID-NACL 1000-0.7 MG/100ML-% IV SOLN
1000.0000 mg | INTRAVENOUS | Status: AC
  Administered 2018-09-29: 1000 mg via INTRAVENOUS

## 2018-09-29 MED ORDER — METOCLOPRAMIDE HCL 5 MG/ML IJ SOLN: 5.0000 mg | Freq: Three times a day (TID) | INTRAMUSCULAR | Status: DC | PRN

## 2018-09-29 MED ORDER — CEFAZOLIN SODIUM-DEXTROSE 2-4 GM/100ML-% IV SOLN
2.0000 g | INTRAVENOUS | Status: AC
  Administered 2018-09-29: 2 g via INTRAVENOUS

## 2018-09-29 MED ORDER — FERROUS SULFATE 325 (65 FE) MG PO TABS
325.0000 mg | ORAL_TABLET | Freq: Three times a day (TID) | ORAL | Status: DC
  Administered 2018-09-30: 325 mg via ORAL
  Filled 2018-09-29: qty 1

## 2018-09-29 MED ORDER — PHENYLEPHRINE 40 MCG/ML (10ML) SYRINGE FOR IV PUSH (FOR BLOOD PRESSURE SUPPORT)
PREFILLED_SYRINGE | INTRAVENOUS | Status: DC | PRN
  Administered 2018-09-29: 80 ug via INTRAVENOUS

## 2018-09-29 MED ORDER — MIDAZOLAM HCL 2 MG/2ML IJ SOLN
INTRAMUSCULAR | Status: AC
  Filled 2018-09-29: qty 2

## 2018-09-29 SURGICAL SUPPLY — 43 items
ADH SKN CLS APL DERMABOND .7 (GAUZE/BANDAGES/DRESSINGS) ×1
BAG DECANTER FOR FLEXI CONT (MISCELLANEOUS) IMPLANT
BAG SPEC THK2 15X12 ZIP CLS (MISCELLANEOUS)
BAG ZIPLOCK 12X15 (MISCELLANEOUS) IMPLANT
BLADE SAG 18X100X1.27 (BLADE) ×2 IMPLANT
BLADE SURG SZ10 CARB STEEL (BLADE) ×4 IMPLANT
COVER PERINEAL POST (MISCELLANEOUS) ×2 IMPLANT
COVER SURGICAL LIGHT HANDLE (MISCELLANEOUS) ×2 IMPLANT
COVER WAND RF STERILE (DRAPES) ×1 IMPLANT
CUP ACETBLR 52 OD PINNACLE (Hips) ×1 IMPLANT
DERMABOND ADVANCED (GAUZE/BANDAGES/DRESSINGS) ×1
DERMABOND ADVANCED .7 DNX12 (GAUZE/BANDAGES/DRESSINGS) ×1 IMPLANT
DRAPE STERI IOBAN 125X83 (DRAPES) ×2 IMPLANT
DRAPE U-SHAPE 47X51 STRL (DRAPES) ×4 IMPLANT
DRESSING AQUACEL AG SP 3.5X10 (GAUZE/BANDAGES/DRESSINGS) ×1 IMPLANT
DRSG AQUACEL AG SP 3.5X10 (GAUZE/BANDAGES/DRESSINGS) ×2
DURAPREP 26ML APPLICATOR (WOUND CARE) ×2 IMPLANT
ELECT REM PT RETURN 15FT ADLT (MISCELLANEOUS) ×2 IMPLANT
ELIMINATOR HOLE APEX DEPUY (Hips) ×1 IMPLANT
GLOVE BIOGEL M STRL SZ7.5 (GLOVE) IMPLANT
GLOVE BIOGEL PI IND STRL 7.5 (GLOVE) ×1 IMPLANT
GLOVE BIOGEL PI IND STRL 8.5 (GLOVE) ×1 IMPLANT
GLOVE BIOGEL PI INDICATOR 7.5 (GLOVE) ×1
GLOVE BIOGEL PI INDICATOR 8.5 (GLOVE) ×1
GLOVE ECLIPSE 8.0 STRL XLNG CF (GLOVE) ×4 IMPLANT
GLOVE ORTHO TXT STRL SZ7.5 (GLOVE) ×2 IMPLANT
GOWN STRL REUS W/TWL 2XL LVL3 (GOWN DISPOSABLE) ×2 IMPLANT
GOWN STRL REUS W/TWL LRG LVL3 (GOWN DISPOSABLE) ×2 IMPLANT
HEAD CERAMIC DELTA 36 PLUS 1.5 (Hips) ×1 IMPLANT
HOLDER FOLEY CATH W/STRAP (MISCELLANEOUS) ×2 IMPLANT
LINER NEUTRAL 52X36MM PLUS 4 (Liner) ×1 IMPLANT
PACK ANTERIOR HIP CUSTOM (KITS) ×2 IMPLANT
SCREW 6.5MMX30MM (Screw) ×1 IMPLANT
STEM FEMORAL SZ8 STD ACTIS (Stem) ×1 IMPLANT
SUT MNCRL AB 4-0 PS2 18 (SUTURE) ×2 IMPLANT
SUT STRATAFIX 0 PDS 27 VIOLET (SUTURE) ×2
SUT VIC AB 1 CT1 36 (SUTURE) ×6 IMPLANT
SUT VIC AB 2-0 CT1 27 (SUTURE) ×4
SUT VIC AB 2-0 CT1 TAPERPNT 27 (SUTURE) ×2 IMPLANT
SUTURE STRATFX 0 PDS 27 VIOLET (SUTURE) ×1 IMPLANT
TRAY FOLEY MTR SLVR 16FR STAT (SET/KITS/TRAYS/PACK) ×1 IMPLANT
WATER STERILE IRR 1000ML POUR (IV SOLUTION) ×2 IMPLANT
YANKAUER SUCT BULB TIP 10FT TU (MISCELLANEOUS) ×1 IMPLANT

## 2018-09-29 NOTE — Interval H&P Note (Signed)
History and Physical Interval Note:  09/29/2018 10:06 AM  Cody Mcconnell  has presented today for surgery, with the diagnosis of Right Hip Osteoarthritis  The various methods of treatment have been discussed with the patient and family. After consideration of risks, benefits and other options for treatment, the patient has consented to  Procedure(s) with comments: [place] (Right) - 70 as a surgical intervention .  The patient's history has been reviewed, patient examined, no change in status, stable for surgery.  I have reviewed the patient's chart and labs.  Questions were answered to the patient's satisfaction.     Mauri Pole

## 2018-09-29 NOTE — Evaluation (Signed)
Physical Therapy Evaluation Patient Details Name: Cody Mcconnell MRN: [id number] DOB: [birth year] Today's Date: 09/29/2018   History of Present Illness  [name], PMH: AS, cervical spine surgery  Clinical Impression  The patient ambulated x 100'. Plans Dc  To home . Has a flight of steps to practice prior. Pt admitted with above diagnosis. Pt currently with functional limitations due to the deficits listed below (see PT Problem List).  Pt will benefit from skilled PT to increase their independence and safety with mobility to allow discharge to the venue listed below.       Follow Up Recommendations Follow surgeon's recommendation for DC plan and follow-up therapies(HEP)    Equipment Recommendations  Rolling walker with 5" wheels;Crutches    Recommendations for Other Services       Precautions / Restrictions Precautions Precautions: Fall Restrictions Weight Bearing Restrictions: No      Mobility  Bed Mobility Overal bed mobility: Needs Assistance Bed Mobility: Supine to Sit     Supine to sit: Min assist     General bed mobility comments: cues for safety and technique  Transfers Overall transfer level: Needs assistance Equipment used: Rolling walker (2 wheeled) Transfers: Sit to/from Stand Sit to Stand: Min assist         General transfer comment: cues for hand and right leg position  Ambulation/Gait Ambulation/Gait assistance: Min assist Gait Distance (Feet): 100 Feet Assistive device: Rolling walker (2 wheeled) Gait Pattern/deviations: Step-to pattern;Step-through pattern     General Gait Details: cues for safety and sequence, redirection  for safety  Stairs            Wheelchair Mobility    Modified Rankin (Stroke Patients Only)       Balance                                             Pertinent Vitals/Pain Pain Assessment: 0-10 Pain Score: 4  Pain Location: right hip Pain Descriptors / Indicators: Aching Pain  Intervention(s): Limited activity within patient's tolerance;Monitored during session;Repositioned;Ice applied;Patient requesting pain meds-RN notified    Home Living Family/patient expects to be discharged to:: Private residence Living Arrangements: Spouse/significant other Available Help at Discharge: Family Type of Home: House Home Access: Stairs to enter Entrance Stairs-Rails: Psychiatric nurse of Steps: 3 Home Layout: Two level Home Equipment: None      Prior Function Level of Independence: Independent               Hand Dominance        Extremity/Trunk Assessment   Upper Extremity Assessment Upper Extremity Assessment: Overall WFL for tasks assessed;RUE deficits/detail RUE Deficits / Details: reports numb right thumb from neck sg    Lower Extremity Assessment Lower Extremity Assessment: RLE deficits/detail RLE Deficits / Details: able to extend knee and flex hip, WB    Cervical / Trunk Assessment Cervical / Trunk Assessment: Normal  Communication      Cognition Arousal/Alertness: Awake/alert Behavior During Therapy: WFL for tasks assessed/performed;Impulsive Overall Cognitive Status: Within Functional Limits for tasks assessed                                        General Comments      Exercises     Assessment/Plan    PT Assessment  Patient needs continued PT services  PT Problem List Decreased strength;Decreased range of motion;Decreased activity tolerance;Decreased mobility;Decreased knowledge of precautions;Decreased safety awareness;Decreased knowledge of use of DME;Decreased cognition;Pain       PT Treatment Interventions DME instruction;Therapeutic exercise;Gait training;Stair training;Functional mobility training;Therapeutic activities;Patient/family education    PT Goals (Current goals can be found in the Care Plan section)  Acute Rehab PT Goals Patient Stated Goal: go home PT Goal Formulation: With  patient Time For Goal Achievement: 10/06/18 Potential to Achieve Goals: Good    Frequency 7X/week   Barriers to discharge        Co-evaluation               AM-PAC PT "6 Clicks" Mobility  Outcome Measure Help needed turning from your back to your side while in a flat bed without using bedrails?: A Little Help needed moving from lying on your back to sitting on the side of a flat bed without using bedrails?: A Little Help needed moving to and from a bed to a chair (including a wheelchair)?: A Lot Help needed standing up from a chair using your arms (e.g., wheelchair or bedside chair)?: A Lot Help needed to walk in hospital room?: A Lot Help needed climbing 3-5 steps with a railing? : A Lot 6 Click Score: 14    End of Session Equipment Utilized During Treatment: Gait belt Activity Tolerance: Patient tolerated treatment well Patient left: in chair;with call bell/phone within reach;with chair alarm set Nurse Communication: Mobility status PT Visit Diagnosis: Unsteadiness on feet (R26.81);Pain Pain - Right/Left: Right Pain - part of body: Hip    Time: 9833-8250 PT Time Calculation (min) (ACUTE ONLY): 21 min   Charges:   PT Evaluation $PT Eval Low Complexity: [place] PT Acute Rehabilitation Services Pager [phone] Office [phone]   Claretha Cooper 09/29/2018, 6:11 PM

## 2018-09-29 NOTE — Discharge Instructions (Signed)

## 2018-09-29 NOTE — Anesthesia Procedure Notes (Addendum)
Spinal  Patient location during procedure: OR Start time: 09/29/2018 10:59 AM End time: 09/29/2018 11:05 AM Reason for block: at surgeon's request Staffing Resident/CRNA: West Pugh, CRNA Performed: resident/CRNA  Preanesthetic Checklist Completed: patient identified, site marked, surgical consent, pre-op evaluation, timeout performed, IV checked, risks and benefits discussed and monitors and equipment checked Spinal Block Patient position: sitting Prep: DuraPrep Patient monitoring: heart rate, continuous pulse ox and blood pressure Approach: midline Location: L4-5 Injection technique: single-shot Needle Needle type: Pencan  Needle gauge: 24 G Needle length: 9 cm Catheter at skin depth: 8 cm Assessment Sensory level: T6 Additional Notes Expiration of kit checked and confirmed. Patient tolerated procedure well,without complications x 1 attempt with noted clear CSF. Loss of motor and sensory on exam post injection. Dr. Sabra Heck at bedside.

## 2018-09-29 NOTE — Op Note (Signed)
NAME:  Cody Mcconnell NO.: [id number]      MEDICAL RECORD NO.: [id number]      FACILITY:  [facility]      PHYSICIAN:  Mauri Pole  DATE OF BIRTH:  10-11-1945     DATE OF PROCEDURE:  09/29/2018                                 OPERATIVE REPORT         PREOPERATIVE DIAGNOSIS: Right  hip osteoarthritis.      POSTOPERATIVE DIAGNOSIS:  Right hip osteoarthritis.      PROCEDURE:  Right total hip replacement through an anterior approach   utilizing DePuy THR system, component size 68mm pinnacle cup, a size 36+4 neutral   Altrex liner, a size 8 standard Actis femoral stem with a 36+1.5 delta ceramic   ball.      SURGEON:  Pietro Cassis. Alvan Dame, M.D.      ASSISTANT:  Danae Orleans, PA-C     ANESTHESIA:  Spinal.      SPECIMENS:  None.      COMPLICATIONS:  None.      BLOOD LOSS:  900 cc     DRAINS:  None.      INDICATION OF THE PROCEDURE:  Cody Mcconnell is a 73 y.o. male who had   presented to office for evaluation of right hip pain.  Radiographs revealed   progressive degenerative changes with bone-on-bone   articulation of the  hip joint, including subchondral cystic changes and osteophytes.  The patient had painful limited range of   motion significantly affecting their overall quality of life and function.  The patient was failing to    respond to conservative measures including medications and/or injections and activity modification and at this point was ready   to proceed with more definitive measures.  Consent was obtained for   benefit of pain relief.  Specific risks of infection, DVT, component   failure, dislocation, neurovascular injury, and need for revision surgery were reviewed in the office as well discussion of   the anterior versus posterior approach were reviewed.     PROCEDURE IN DETAIL:  The patient was brought to operative theater.   Once adequate anesthesia, preoperative antibiotics, 2 gm of Ancef, 1 gm of  Tranexamic Acid, and 10 mg of Decadron were administered, the patient was positioned supine on the [employer] table.  Once the patient was safely positioned with adequate padding of boney prominences we predraped out the hip, and used fluoroscopy to confirm orientation of the pelvis.      The right hip was then prepped and draped from proximal iliac crest to   mid thigh with a shower curtain technique.      Time-out was performed identifying the patient, planned procedure, and the appropriate extremity.     An incision was then made 2 cm lateral to the   anterior superior iliac spine extending over the orientation of the   tensor fascia lata muscle and sharp dissection was carried down to the   fascia of the muscle.      The fascia was then incised.  The muscle belly was identified and swept   laterally and retractor placed along the superior neck.  Following   cauterization of the circumflex vessels and removing some  pericapsular   fat, a second cobra retractor was placed on the inferior neck.  A T-capsulotomy was made along the line of the   superior neck to the trochanteric fossa, then extended proximally and   distally.  Tag sutures were placed and the retractors were then placed   intracapsular.  We then identified the trochanteric fossa and   orientation of my neck cut and then made a neck osteotomy with the femur on traction.  The femoral   head was removed without difficulty or complication.  Traction was let   off and retractors were placed posterior and anterior around the   acetabulum.      The labrum and foveal tissue were debrided.  I began reaming with a 46 mm   reamer and reamed up to 51 mm reamer with good bony bed preparation and a 52 mm  cup was chosen.  The final 52 mm Pinnacle cup was then impacted under fluoroscopy to confirm the depth of penetration and orientation with respect to   Abduction and forward flexion.  A screw was placed into the ilium followed by the hole  eliminator.  The final   36+4 neutral Altrex liner was impacted with good visualized rim fit.  The cup was positioned anatomically within the acetabular portion of the pelvis.      At this point, the femur was rolled to 100 degrees.  Further capsule was   released off the inferior aspect of the femoral neck.  I then   released the superior capsule proximally.  With the leg in a neutral position the hook was placed laterally   along the femur under the vastus lateralis origin and elevated manually and then held in position using the hook attachment on the bed.  The leg was then extended and adducted with the leg rolled to 100   degrees of external rotation.  Retractors were placed along the medial calcar and posteriorly over the greater trochanter.  Once the proximal femur was fully   exposed, I used a box osteotome to set orientation.  I then began   broaching with the starting chili pepper broach and passed this by hand and then broached up to 8.  With the 8 broach in place I chose a standard offset neck and did several trial reductions.  The offset was appropriate, leg lengths   appeared to be equal best matched with the +1.5 head ball trial confirmed radiographically.   Given these findings, I went ahead and dislocated the hip, repositioned all   retractors and positioned the right hip in the extended and abducted position.  The final 8 standard Actis femoral stem was   chosen and it was impacted down to the level of neck cut.  Based on this   and the trial reductions, a final 36+1.5 delta ceramic ball was chosen and   impacted onto a clean and dry trunnion, and the hip was reduced.  The   hip had been irrigated throughout the case again at this point.  I did   reapproximate the superior capsular leaflet to the anterior leaflet   using #1 Vicryl.  The fascia of the   tensor fascia lata muscle was then reapproximated using #1 Vicryl and #0 Stratafix sutures.  The   remaining wound was closed  with 2-0 Vicryl and running 4-0 Monocryl.   The hip was cleaned, dried, and dressed sterilely using Dermabond and   Aquacel dressing.  The patient was then brought  to recovery room in stable condition tolerating the procedure well.    Danae Orleans, PA-C was present for the entirety of the case involved from   preoperative positioning, perioperative retractor management, general   facilitation of the case, as well as primary wound closure as assistant.            Pietro Cassis Alvan Dame, M.D.        09/29/2018 12:31 PM

## 2018-09-29 NOTE — Anesthesia Procedure Notes (Signed)
Procedure Name: MAC Date/Time: 09/29/2018 10:56 AM Performed by: West Pugh, CRNA Pre-anesthesia Checklist: Patient identified, Emergency Drugs available, Suction available, Patient being monitored and Timeout performed Patient Re-evaluated:Patient Re-evaluated prior to induction Oxygen Delivery Method: Simple face mask Preoxygenation: Pre-oxygenation with 100% oxygen Induction Type: IV induction Number of attempts: 1 Placement Confirmation: positive ETCO2 Dental Injury: Teeth and Oropharynx as per pre-operative assessment

## 2018-09-29 NOTE — Anesthesia Postprocedure Evaluation (Signed)
Anesthesia Post Note  Patient: [name]  Procedure(s) Performed: TOTAL HIP ARTHROPLASTY ANTERIOR APPROACH (Right Hip)     Patient location during evaluation: PACU Anesthesia Type: Spinal Level of consciousness: oriented and awake and alert Pain management: pain level controlled Vital Signs Assessment: post-procedure vital signs reviewed and stable Respiratory status: spontaneous breathing and respiratory function stable Cardiovascular status: blood pressure returned to baseline and stable Postop Assessment: no headache, no backache and no apparent nausea or vomiting Anesthetic complications: no    Last Vitals:  Vitals:   09/29/18 1415 09/29/18 1430  BP: 131/76 129/73  Pulse: 62 66  Resp: 12 12  Temp:    SpO2: 99% 99%    Last Pain:  Vitals:   09/29/18 1415  TempSrc:   PainSc: 0-No pain                 Lynda Rainwater

## 2018-09-29 NOTE — Transfer of Care (Signed)
Immediate Anesthesia Transfer of Care Note  Patient: Cody Mcconnell  Procedure(s) Performed: TOTAL HIP ARTHROPLASTY ANTERIOR APPROACH (Right Hip)  Patient Location: PACU  Anesthesia Type:MAC and Spinal  Level of Consciousness: awake, alert , oriented and patient cooperative  Airway & Oxygen Therapy: Patient Spontanous Breathing and Patient connected to face mask oxygen  Post-op Assessment: Report given to RN and Post -op Vital signs reviewed and stable  Post vital signs: Reviewed and stable  Last Vitals:  Vitals Value Taken Time  BP 113/65 09/29/2018 12:49 PM  Temp    Pulse 69 09/29/2018 12:51 PM  Resp 16 09/29/2018 12:51 PM  SpO2 100 % 09/29/2018 12:51 PM  Vitals shown include unvalidated device data.  Last Pain:  Vitals:   09/29/18 1015  TempSrc: Oral  PainSc:       Patients Stated Pain Goal: 4 (85/27/78 2423)  Complications: No apparent anesthesia complications

## 2018-09-30 ENCOUNTER — Ambulatory Visit: Payer: Managed Care, Other (non HMO) | Admitting: Cardiovascular Disease

## 2018-09-30 ENCOUNTER — Encounter (HOSPITAL_COMMUNITY): Payer: Self-pay | Admitting: Orthopedic Surgery

## 2018-09-30 DIAGNOSIS — M1611 Unilateral primary osteoarthritis, right hip: Secondary | ICD-10-CM | POA: Diagnosis not present

## 2018-09-30 DIAGNOSIS — I480 Paroxysmal atrial fibrillation: Secondary | ICD-10-CM | POA: Diagnosis not present

## 2018-09-30 DIAGNOSIS — I2584 Coronary atherosclerosis due to calcified coronary lesion: Secondary | ICD-10-CM | POA: Diagnosis not present

## 2018-09-30 DIAGNOSIS — E663 Overweight: Secondary | ICD-10-CM

## 2018-09-30 DIAGNOSIS — E7800 Pure hypercholesterolemia, unspecified: Secondary | ICD-10-CM | POA: Diagnosis not present

## 2018-09-30 DIAGNOSIS — I2510 Atherosclerotic heart disease of native coronary artery without angina pectoris: Secondary | ICD-10-CM | POA: Diagnosis not present

## 2018-09-30 DIAGNOSIS — Z7901 Long term (current) use of anticoagulants: Secondary | ICD-10-CM | POA: Diagnosis not present

## 2018-09-30 HISTORY — DX: Overweight: E66.3

## 2018-09-30 LAB — BASIC METABOLIC PANEL
Anion gap: 8 (ref 5–15)
BUN: 16 mg/dL (ref 8–23)
CO2: 22 mmol/L (ref 22–32)
Calcium: 8.4 mg/dL — ABNORMAL LOW (ref 8.9–10.3)
Chloride: 109 mmol/L (ref 98–111)
Creatinine, Ser: 0.84 mg/dL (ref 0.61–1.24)
GFR calc Af Amer: >60 mL/min (ref >60)
GFR calc non Af Amer: >60 mL/min (ref >60)
Glucose, Bld: 185 mg/dL — ABNORMAL HIGH (ref 70–99)
Potassium: 3.6 mmol/L (ref 3.5–5.1)
Sodium: 139 mmol/L (ref 135–145)

## 2018-09-30 LAB — CBC
HCT: 33.3 % — ABNORMAL LOW (ref 39.0–52.0)
Hemoglobin: 10.6 g/dL — ABNORMAL LOW (ref 13.0–17.0)
MCH: 27.6 pg (ref 26.0–34.0)
MCHC: 31.8 g/dL (ref 30.0–36.0)
MCV: 86.7 fL (ref 80.0–100.0)
Platelets: 220 10*3/uL (ref 150–400)
RBC: 3.84 MIL/uL — ABNORMAL LOW (ref 4.22–5.81)
RDW: 13.3 % (ref 11.5–15.5)
WBC: 11.9 10*3/uL — ABNORMAL HIGH (ref 4.0–10.5)
nRBC: 0 % (ref 0.0–0.2)

## 2018-09-30 MED ORDER — ATORVASTATIN CALCIUM 20 MG PO TABS: 20.0000 mg | ORAL_TABLET | Freq: Every day | ORAL | Status: DC

## 2018-09-30 NOTE — Plan of Care (Signed)
  Problem: Clinical Measurements: Goal: Will remain free from infection Outcome: Progressing Goal: Respiratory complications will improve Outcome: Progressing Goal: Cardiovascular complication will be avoided Outcome: Progressing   Problem: Pain Managment: Goal: General experience of comfort will improve Outcome: Progressing   Problem: Safety: Goal: Ability to remain free from injury will improve Outcome: Progressing   

## 2018-09-30 NOTE — Progress Notes (Signed)
Physical Therapy Treatment Patient Details Name: Cody Mcconnell MRN: [id number] DOB: Feb 12, 1946 Today's Date: 09/30/2018    History of Present Illness [name], PMH: AS, cervical spine surgery    PT Comments    Patient reports that pain is minimal. Plans Dc after practices stairs next visit. Instructed patient to get a SPC and to use  Cane on left side.    Follow Up Recommendations  Follow surgeon's recommendation for DC plan and follow-up therapies     Equipment Recommendations  Rolling walker with 5" wheels;Crutches    Recommendations for Other Services       Precautions / Restrictions Precautions Precautions: Fall    Mobility  Bed Mobility   Bed Mobility: Supine to Sit     Supine to sit: Supervision        Transfers   Equipment used: Rolling walker (2 wheeled)   Sit to Stand: Supervision         General transfer comment: cues for hand and right leg position  Ambulation/Gait Ambulation/Gait assistance: Supervision Gait Distance (Feet): 440 Feet Assistive device: Rolling walker (2 wheeled) Gait Pattern/deviations: Step-through pattern     General Gait Details: patient tested  no UE support for several  steps. Encouraged to use the RW.   Stairs             Wheelchair Mobility    Modified Rankin (Stroke Patients Only)       Balance                                            Cognition Arousal/Alertness: Awake/alert                                            Exercises Total Joint Exercises Ankle Circles/Pumps: AROM;Both;10 reps Quad Sets: AROM;Both;10 reps Short Arc Quad: AROM;Right;10 reps Heel Slides: AAROM;Right;10 reps Hip ABduction/ADduction: AAROM;Right;10 reps    General Comments        Pertinent Vitals/Pain Pain Score: 2  Pain Location: right hip Pain Descriptors / Indicators: Sore Pain Intervention(s): Monitored during session;Premedicated before session;Ice applied     Home Living                      Prior Function            PT Goals (current goals can now be found in the care plan section) Progress towards PT goals: Progressing toward goals    Frequency    7X/week      PT Plan Current plan remains appropriate    Co-evaluation              AM-PAC PT "6 Clicks" Mobility   Outcome Measure  Help needed turning from your back to your side while in a flat bed without using bedrails?: None Help needed moving from lying on your back to sitting on the side of a flat bed without using bedrails?: None Help needed moving to and from a bed to a chair (including a wheelchair)?: A Little Help needed standing up from a chair using your arms (e.g., wheelchair or bedside chair)?: A Little Help needed to walk in hospital room?: A Little Help needed climbing 3-5 steps with a railing? : A Little 6 Click Score: 20  End of Session   Activity Tolerance: Patient tolerated treatment well Patient left: in chair;with call bell/phone within reach;with chair alarm set Nurse Communication: Mobility status PT Visit Diagnosis: Unsteadiness on feet (R26.81);Pain Pain - Right/Left: Right Pain - part of body: Hip     Time: 0820-0900 PT Time Calculation (min) (ACUTE ONLY): 40 min  Charges:  $Gait Training: 8-22 mins $Therapeutic Exercise: 8-22 mins $Self Care/Home Management: [place] Pager [phone] Office [phone]    Claretha Cooper 09/30/2018, 9:34 AM

## 2018-09-30 NOTE — Progress Notes (Signed)
     Subjective: 1 Day Post-Op Procedure(s) (LRB): TOTAL HIP ARTHROPLASTY ANTERIOR APPROACH (Right)   Patient reports pain as mild, pain controlled.  No events throughout the night. Dr. Alvan Dame discussed the procedure and expectations.  Many questions asked and answered.  Ready to be discharged home, if he does well with PT.    Objective:   VITALS:   Vitals:   09/29/18 2330 09/30/18 0447  BP: 114/66 126/67  Pulse: 75 73  Resp: 17 16  Temp: 98 F (36.7 C) 98.4 F (36.9 C)  SpO2: 99% 99%    Dorsiflexion/Plantar flexion intact Incision: dressing C/D/I No cellulitis present Compartment soft  LABS Recent Labs    09/30/18 0406  HGB 10.6*  HCT 33.3*  WBC 11.9*  PLT 220    Recent Labs    09/30/18 0406  NA 139  K 3.6  BUN 16  CREATININE 0.84  GLUCOSE 185*     Assessment/Plan: 1 Day Post-Op Procedure(s) (LRB): TOTAL HIP ARTHROPLASTY ANTERIOR APPROACH (Right) Foley cath d/c'ed Advance diet Up with therapy D/C IV fluids Discharge home Follow up in 2 weeks at [facility] Follow up with OLIN,Dillard Pascal D in 2 weeks.  Contact information:  [facility]) [address] [place] [phone]    Overweight (BMI 25-29.9) Estimated body mass index is 26.67 kg/m as calculated from the following:   Height as of this encounter: 5' 8.5" (1.74 m).   Weight as of this encounter: 80.7 kg. Patient also counseled that weight may inhibit the healing process Patient counseled that losing weight will help with future health issues      West Pugh. Zane Samson   PAC  09/30/2018, 7:59 AM

## 2018-09-30 NOTE — Discharge Summary (Signed)
Physician Discharge Summary  Patient ID: Cody Mcconnell MRN: [id number] DOB/AGE: 10/26/1945 73 y.o.  Admit date: 09/29/2018 Discharge date:  09/30/2018  Procedures:  Procedure(s) (LRB): TOTAL HIP ARTHROPLASTY ANTERIOR APPROACH (Right)  Attending Physician:  Dr. Paralee Cancel   Admission Diagnoses:   Right hip primary OA / pain  Discharge Diagnoses:  Principal Problem:   S/P right THA, AA Active Problems:   Overweight (BMI 25.0-29.9)  Past Medical History:  Diagnosis Date  . Anemia    SLIGHT ANEMIA 4 MONTHS AGO  . Aortic valve stenosis 12/19/2017   Echo 07/30/16 - EF 55-60, normal wall motion, normal diastolic function, mild aortic stenosis (mean 9, peak 16), mild LAE // Echo 4/19:  EF 60-65, no RWMA, Gr 2 DD, mild AS (mean 11, peak 24)   . Arthritis    OA  . Benign prostate hyperplasia    unspecified whether lower urinary tract sym. present   . Carotid atherosclerosis    Carotid US 4/19:  bilat ICA 1-39; vertebrals and subclavians normal  . Dysrhythmia    ATRIAL FIB  . Glucose intolerance    "pre-diabetic"  . Hayfever   . Heart murmur   . History of exercise stress test    GXT 4/19:  ETT with good exercise tolerance (10:00); no chest pain; normal BP response; no diagnostic ST changes; negative adequate ETT.  Marland Kitchen HOH (hard of hearing)    BOTH EARS  . Numbness of right thumb    FROM CERVICAL NECK SURGERY  . Pre-diabetes   . Prostatitis    unspecified prostatitis type  . PSA elevation   . Pure hypercholesterolemia     HPI:    Cody Mcconnell, 73 y.o. male, has a history of pain and functional disability in the right hip(s) due to arthritis and patient has failed non-surgical conservative treatments for greater than 12 weeks to include analgesics and activity modification.  Onset of symptoms was gradual starting years ago with gradually worsening course since that time.The patient noted no past surgery on the right hip(s).  Patient currently rates pain in the  right hip at 7 out of 10 with activity. Patient has worsening of pain with activity and weight bearing, trendelenberg gait, pain that interfers with activities of daily living and pain with passive range of motion. Patient has evidence of periarticular osteophytes and joint space narrowing by imaging studies. This condition presents safety issues increasing the risk of falls.  There is no current active infection.  Risks, benefits and expectations were discussed with the patient.  Risks including but not limited to the risk of anesthesia, blood clots, nerve damage, blood vessel damage, failure of the prosthesis, infection and up to and including death.  Patient understand the risks, benefits and expectations and wishes to proceed with surgery.   PCP: Jani Gravel, MD   Discharged Condition: good  Hospital Course:  Patient underwent the above stated procedure on 09/29/2018. Patient tolerated the procedure well and brought to the recovery room in good condition and subsequently to the floor.  POD #1 BP: 126/67 ; Pulse: 73 ; Temp: 98.4 F (36.9 C) ; Resp: 16 Patient reports pain as mild, pain controlled.  No events throughout the night. Dr. Alvan Dame discussed the procedure and expectations.  Many questions asked and answered.  Ready to be discharged home. Dorsiflexion/plantar flexion intact, incision: dressing C/D/I, no cellulitis present and compartment soft.   LABS  Basename    HGB     10.6  HCT  33.3    Discharge Exam: General appearance: alert, cooperative and no distress Extremities: Homans sign is negative, no sign of DVT, no edema, redness or tenderness in the calves or thighs and no ulcers, gangrene or trophic changes  Disposition: Home with follow up in 2 weeks   Follow-up Information    Paralee Cancel, MD. Schedule an appointment as soon as possible for a visit in 2 weeks.   Specialty:  Orthopedic Surgery Contact information: [address] [place]  [address] [phone]           Discharge Instructions    Call MD / Call 911   Complete by:  As directed    If you experience chest pain or shortness of breath, CALL 911 and be transported to the hospital emergency room.  If you develope a fever above 101 F, pus (white drainage) or increased drainage or redness at the wound, or calf pain, call your surgeon's office.   Change dressing   Complete by:  As directed    Maintain surgical dressing until follow up in the clinic. If the edges start to pull up, may reinforce with tape. If the dressing is no longer working, may remove and cover with gauze and tape, but must keep the area dry and clean.  Call with any questions or concerns.   Constipation Prevention   Complete by:  As directed    Drink plenty of fluids.  Prune juice may be helpful.  You may use a stool softener, such as Colace (over the counter) 100 mg twice a day.  Use MiraLax (over the counter) for constipation as needed.   Diet - low sodium heart healthy   Complete by:  As directed    Discharge instructions   Complete by:  As directed    Maintain surgical dressing until follow up in the clinic. If the edges start to pull up, may reinforce with tape. If the dressing is no longer working, may remove and cover with gauze and tape, but must keep the area dry and clean.  Follow up in 2 weeks at [facility]. Call with any questions or concerns.   Increase activity slowly as tolerated   Complete by:  As directed    Weight bearing as tolerated with assist device (walker, cane, etc) as directed, use it as long as suggested by your surgeon or therapist, typically at least 4-6 weeks.   TED hose   Complete by:  As directed    Use stockings (TED hose) for 2 weeks on both leg(s).  You may remove them at night for sleeping.      Allergies as of 09/30/2018   No Known Allergies     Medication List    TAKE these medications   ALLEGRA ALLERGY 180 MG tablet Generic drug:   fexofenadine Take 180 mg by mouth every other day.   apixaban 5 MG Tabs tablet Commonly known as:  ELIQUIS Take 1 tablet (5 mg total) by mouth 2 (two) times daily.   cetirizine 10 MG tablet Commonly known as:  ZYRTEC Take 10 mg by mouth every other day.   diltiazem 120 MG 24 hr capsule Commonly known as:  CARDIZEM CD Take 1 capsule (120 mg total) by mouth daily.   docusate sodium 100 MG capsule Commonly known as:  COLACE Take 1 capsule (100 mg total) by mouth 2 (two) times daily.   ferrous sulfate 325 (65 FE) MG tablet Commonly known as:  FERROUSUL Take 1 tablet (  325 mg total) by mouth 3 (three) times daily with meals.   fluticasone 50 MCG/ACT nasal spray Commonly known as:  FLONASE Place 1 spray into both nostrils 2 (two) times daily.   HYDROcodone-acetaminophen 7.5-325 MG tablet Commonly known as:  NORCO Take 1-2 tablets by mouth every 4 (four) hours as needed for moderate pain.   methocarbamol 500 MG tablet Commonly known as:  ROBAXIN Take 1 tablet (500 mg total) by mouth every 6 (six) hours as needed for muscle spasms.   MULTIVITAMIN ADULT PO Take 1 tablet by mouth daily.   polyethylene glycol packet Commonly known as:  MIRALAX / GLYCOLAX Take 17 g by mouth 2 (two) times daily.   PROBIOTIC DAILY PO Take 1 tablet by mouth every other day.   simvastatin 40 MG tablet Commonly known as:  ZOCOR Take 40 mg by mouth daily.   TURMERIC PO Take 1 tablet by mouth daily.            Discharge Care Instructions  (From admission, onward)         Start     Ordered   09/30/18 0000  Change dressing    Comments:  Maintain surgical dressing until follow up in the clinic. If the edges start to pull up, may reinforce with tape. If the dressing is no longer working, may remove and cover with gauze and tape, but must keep the area dry and clean.  Call with any questions or concerns.   09/30/18 0802           Signed: [place] Pugh. Gedalia Mcmillon   PA-C  09/30/2018, 8:49 AM

## 2018-09-30 NOTE — Care Management Note (Signed)
Case Management Note  Patient Details  Name: Cody Mcconnell MRN: [id number] Date of Birth: Dec 23, 1945  Subjective/Objective:       Needs a RW             Action/Plan: Contacted [facility] to deliver RW to the room.  Expected Discharge Date:  09/30/18               Expected Discharge Plan:  Home/Self Care  In-House Referral:  NA  Discharge planning Services  CM Consult  Post Acute Care Choice:  Durable Medical Equipment Choice offered to:  Patient  DME Arranged:  [facility]ford Rile rolling DME Agency:  [place]:  NA [place] Agency:  NA  Status of Service:  Completed, signed off  If discussed at [place] of Stay Meetings, dates discussed:    Additional Comments:  Guadalupe Maple, RN 09/30/2018, 9:13 AM

## 2018-09-30 NOTE — Progress Notes (Signed)
The order for simvastatin(Zocor) was changed to an equivalent dose of atorvastatin(Lipitor) due to the potential drug interaction with diltiazem.  When taken in combination with medications that inhibit its metabolism, simvastatin can accumulate which increases the risk of liver toxicity, myopathy, or rhabdomyolysis.  Simvastatin dose should not exceed 10mg /day in patients taking verapamil, diltiazem, fibrates, or niacin >or= 1g/day.   Simvastatin dose should not exceed 20mg /day in patients taking amlodipine, ranolazine or amiodarone.   Please consider this potential interaction at discharge.  Cody Mcconnell A 09/30/2018 7:19 AM

## 2018-10-08 DIAGNOSIS — Z96641 Presence of right artificial hip joint: Secondary | ICD-10-CM | POA: Diagnosis not present

## 2018-10-08 DIAGNOSIS — Z471 Aftercare following joint replacement surgery: Secondary | ICD-10-CM | POA: Diagnosis not present

## 2018-10-31 ENCOUNTER — Telehealth: Payer: Self-pay | Admitting: Cardiology

## 2018-10-31 NOTE — Telephone Encounter (Signed)
Pt called and says he believes he is in AF- HR 90-130 on his watch. He also has been holding his Eliquis in preparation for a back injection scheduled for Monday.  I reviewed this with Dr Marlou Porch.  Our recommendations are to resume the Eliquis to reduce his risk of stroke.  Unfortunately his back injection will have to be put off for now.  I also suggested he increase his Diltiazem to 120 mg BID. The patient understands and will comply. Office will contact him Monday for f/u.   Kerin Ransom PA-C 10/31/2018 10:09 AM

## 2018-11-04 DIAGNOSIS — Z96641 Presence of right artificial hip joint: Secondary | ICD-10-CM | POA: Diagnosis not present

## 2018-11-04 DIAGNOSIS — M545 Low back pain: Secondary | ICD-10-CM | POA: Diagnosis not present

## 2018-11-04 DIAGNOSIS — M4322 Fusion of spine, cervical region: Secondary | ICD-10-CM | POA: Diagnosis not present

## 2018-11-04 DIAGNOSIS — R1031 Right lower quadrant pain: Secondary | ICD-10-CM | POA: Diagnosis not present

## 2018-11-04 DIAGNOSIS — M1611 Unilateral primary osteoarthritis, right hip: Secondary | ICD-10-CM | POA: Diagnosis not present

## 2018-11-04 DIAGNOSIS — M48061 Spinal stenosis, lumbar region without neurogenic claudication: Secondary | ICD-10-CM | POA: Diagnosis not present

## 2018-11-04 DIAGNOSIS — Z7901 Long term (current) use of anticoagulants: Secondary | ICD-10-CM | POA: Diagnosis not present

## 2018-11-04 DIAGNOSIS — I4891 Unspecified atrial fibrillation: Secondary | ICD-10-CM | POA: Diagnosis not present

## 2018-11-05 ENCOUNTER — Ambulatory Visit (INDEPENDENT_AMBULATORY_CARE_PROVIDER_SITE_OTHER): Payer: Medicare Other | Admitting: Cardiovascular Disease

## 2018-11-05 ENCOUNTER — Encounter: Payer: Self-pay | Admitting: Cardiovascular Disease

## 2018-11-05 VITALS — BP 134/82 | HR 71 | Ht 68.5 in | Wt 182.1 lb

## 2018-11-05 DIAGNOSIS — I480 Paroxysmal atrial fibrillation: Secondary | ICD-10-CM | POA: Diagnosis not present

## 2018-11-05 DIAGNOSIS — E782 Mixed hyperlipidemia: Secondary | ICD-10-CM

## 2018-11-05 DIAGNOSIS — I350 Nonrheumatic aortic (valve) stenosis: Secondary | ICD-10-CM

## 2018-11-05 DIAGNOSIS — I2510 Atherosclerotic heart disease of native coronary artery without angina pectoris: Secondary | ICD-10-CM | POA: Diagnosis not present

## 2018-11-05 NOTE — Patient Instructions (Addendum)
Medication Instructions:  Your provider recommends that you continue on your current medications as directed. Please refer to the Current Medication list given to you today.    Labwork: None  Testing/Procedures: None  Follow-Up: You have an appointment with Richardson Dopp on April 28, 2019 at 10:15AM.

## 2018-11-05 NOTE — Progress Notes (Signed)
Cardiology Office Note:    Date:  11/05/2018   ID:  DSEAN VANTOL, DOB December 23, 1945, MRN [id number]  PCP:  Jani Gravel, MD  Cardiologist:  Sherren Mocha, MD  Electrophysiologist:  None   Referring MD: Jani Gravel, MD   Chief Complaint  Patient presents with  . Palpitations   History of Present Illness:    Cody Mcconnell is a 73 y.o. male with a hx of paroxysmal atrial fibrillation, heart murmur, and mixed hyperlipidemia, presenting for follow-up evaluation.  The patient was initially evaluated in the emergency department last year with atrial fibrillation with RVR.  He was placed on Cardizem and apixaban and discharged home.  He did not have recurrence of symptomatic atrial fibrillation until last week.  He felt jittery with heart palpitations and his apple watch showed a heart rate ranging from 130 to 150 bpm.  He called into the answering service and spoke with Kerin Ransom who recommended increasing diltiazem to 120 mg twice daily and starting back on apixaban.  The patient's heart rate returned back to a normal range by the next day.  He thinks the entire episode lasted about 12 hours.  He is otherwise felt well with no cardiac symptoms.  He specifically denies chest pain, chest pressure, or shortness of breath.  He has had no lightheadedness or syncope.  He has been having a lot of orthopedic problems related to his hip and back.  He just had a back injection yesterday.  He has had no bleeding problems on apixaban.  He actually feels that he bleeds less on apixaban than he did with low-dose aspirin.  Past Medical History:  Diagnosis Date  . Anemia    SLIGHT ANEMIA 4 MONTHS AGO  . Aortic valve stenosis 12/19/2017   Echo 07/30/16 - EF 55-60, normal wall motion, normal diastolic function, mild aortic stenosis (mean 9, peak 16), mild LAE // Echo 4/19:  EF 60-65, no RWMA, Gr 2 DD, mild AS (mean 11, peak 24)   . Arthritis    OA  . Benign prostate hyperplasia    unspecified whether  lower urinary tract sym. present   . Carotid atherosclerosis    Carotid US 4/19:  bilat ICA 1-39; vertebrals and subclavians normal  . Dysrhythmia    ATRIAL FIB  . Glucose intolerance    "pre-diabetic"  . Hayfever   . Heart murmur   . History of exercise stress test    GXT 4/19:  ETT with good exercise tolerance (10:00); no chest pain; normal BP response; no diagnostic ST changes; negative adequate ETT.  Marland Kitchen HOH (hard of hearing)    BOTH EARS  . Numbness of right thumb    FROM CERVICAL NECK SURGERY  . Pre-diabetes   . Prostatitis    unspecified prostatitis type  . PSA elevation   . Pure hypercholesterolemia     Past Surgical History:  Procedure Laterality Date  . CERVICAL SPINE SURGERY  2011  . COLONOSCOPY    . HIP SURGERY Right    arthroscopy  . LUMBAR DISC SURGERY  2014   L2-3, L3 TO L4 X 2 2019 AT [facility]  . SHOULDER ARTHROSCOPY Right   . TOTAL HIP ARTHROPLASTY Right 09/29/2018   Procedure: TOTAL HIP ARTHROPLASTY ANTERIOR APPROACH;  Surgeon: Paralee Cancel, MD;  Location: [facility];  Service: Orthopedics;  Laterality: Right;  71    Current Medications: Current Meds  Medication Sig  . apixaban (ELIQUIS) 5 MG TABS tablet Take 1 tablet (5 mg total)  by mouth 2 (two) times daily.  . cetirizine (ZYRTEC) 10 MG tablet Take 10 mg by mouth every other day.  . diltiazem (CARDIZEM CD) 120 MG 24 hr capsule Take 1 capsule (120 mg total) by mouth daily.  . fexofenadine (ALLEGRA ALLERGY) 180 MG tablet Take 180 mg by mouth every other day.  . fluticasone (FLONASE) 50 MCG/ACT nasal spray Place 1 spray into both nostrils 2 (two) times daily.   Marland Kitchen HYDROcodone-acetaminophen (NORCO) 7.5-325 MG tablet Take 1-2 tablets by mouth every 4 (four) hours as needed for moderate pain.  . Multiple Vitamins-Minerals (MULTIVITAMIN ADULT PO) Take 1 tablet by mouth daily.  . Probiotic Product (PROBIOTIC DAILY PO) Take 1 tablet by mouth every other day.   . simvastatin (ZOCOR) 40 MG tablet Take 40 mg by mouth  daily.  . TURMERIC PO Take 1 tablet by mouth daily.     Allergies:   Patient has no known allergies.   Social History   Socioeconomic History  . Marital status: Married    Spouse name: Not on file  . Number of children: 2  . Years of education: Not on file  . Highest education level: Not on file  Occupational History  . 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 Occupation: Long Term Care Facilities    Comment: Owns several  Social Needs  . Financial resource strain: Not on file  . Food insecurity:    Worry: Not on file    Inability: Not on file  . Transportation needs:    Medical: Not on file    Non-medical: Not on file  Tobacco Use  . Smoking status: Former Smoker    Packs/day: 2.00    Years: 9.00    Pack years: 18.00  . Smokeless tobacco: Never Used  . Tobacco comment: QUIT 1081  Substance and Sexual Activity  . Alcohol use: Never    Frequency: Never  . Drug use: Never  . Sexual activity: Not on file  Lifestyle  . Physical activity:    Days per week: Not on file    Minutes per session: Not on file  . Stress: Not on file  Relationships  . Social connections:    Talks on phone: Not on file    Gets together: Not on file    Attends religious service: Not on file    Active member of club or organization: Not on file    Attends meetings of clubs or organizations: Not on file    Relationship status: Not on file  Other Topics Concern  . 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  ROS:   Please see the history of present illness.    All other systems reviewed and are negative.  EKGs/Labs/Other Studies Reviewed:    The following studies were reviewed today: Echo  12/31/2017: Left ventricle:  The cavity size was normal. Wall thickness was normal. Systolic function was normal. The estimated ejection fraction was in the range of 60% to 65%. Wall motion was normal; there were no regional wall motion abnormalities. Features are consistent with a pseudonormal left ventricular filling pattern, with concomitant abnormal relaxation and increased filling pressure (grade 2 diastolic  dysfunction).  ------------------------------------------------------------------- Aortic valve:   Moderately thickened, moderately calcified leaflets.  Doppler:   There was mild stenosis.      VTI ratio of LVOT to aortic valve: 0.38. Valve area (VTI): 1.43 cm^2. Indexed valve area (VTI): 0.72 cm^2/m^2. Peak velocity ratio of LVOT to aortic valve: 0.42. Valve area (Vmax): 1.58 cm^2. Indexed valve area (Vmax): 0.8 cm^2/m^2. Mean velocity ratio of LVOT to aortic valve: 0.4. Valve area (Vmean): 1.54 cm^2. Indexed valve area (Vmean): 0.78 cm^2/m^2.    Mean gradient (S): 11 mm Hg. Peak gradient (S): 24 mm Hg.  ------------------------------------------------------------------- Aorta:  Aortic root: The aortic root was normal in size. Ascending aorta: The ascending aorta was normal in size.  ------------------------------------------------------------------- Mitral valve:   Structurally normal valve.   Leaflet separation was normal.  Doppler:  Transvalvular velocity was within the normal range. There was no evidence for stenosis. There was no regurgitation.    Valve area by pressure half-time: 2.56 cm^2. Indexed valve area by pressure half-time: 1.29 cm^2/m^2.  ------------------------------------------------------------------- Left atrium:  The atrium was normal in size.  ------------------------------------------------------------------- Right ventricle:  The cavity size was normal. Systolic function  was normal.  ------------------------------------------------------------------- Pulmonic valve:    The valve appears to be grossly normal. Doppler:  There was no significant regurgitation.  ------------------------------------------------------------------- Tricuspid valve:   Structurally normal valve.   Leaflet separation was normal.  Doppler:  Transvalvular velocity was within the normal range. There was trivial regurgitation.  ------------------------------------------------------------------- Right atrium:  The atrium was normal in size.  ------------------------------------------------------------------- Pericardium:  There was no pericardial effusion.   EKG:  EKG is ordered today.  The ekg ordered today demonstrates normal sinus rhythm with sinus arrhythmia, heart rate 71 bpm.  Recent Labs: 09/30/2018: BUN 16; Creatinine, Ser 0.84; Hemoglobin 10.6; Platelets 220; Potassium 3.6; Sodium 139  Recent Lipid Panel No results found for: CHOL, TRIG, HDL, CHOLHDL, VLDL, LDLCALC, LDLDIRECT  Physical Exam:    VS:  BP 134/82   Pulse 71   Ht 5' 8.5" (1.74 m)   Wt 182 lb 1.9 oz (82.6 kg)   SpO2 99%   BMI 27.29 kg/m     Wt Readings from Last 3 Encounters:  11/05/18 182 lb 1.9 oz (82.6 kg)  09/29/18 178 lb (80.7 kg)  09/23/18 178 lb (80.7 kg)     GEN:  Well nourished, well developed in no acute distress HEENT: Normal NECK: No JVD; No carotid bruits LYMPHATICS: No lymphadenopathy CARDIAC: RRR, no murmurs, rubs, gallops RESPIRATORY:  Clear to auscultation without rales, wheezing or rhonchi  ABDOMEN: Soft, non-tender, non-distended MUSCULOSKELETAL:  No edema; No deformity  SKIN: Warm and dry NEUROLOGIC:  Alert and oriented x 3 PSYCHIATRIC:  Normal affect   ASSESSMENT:    1. Paroxysmal atrial fibrillation (HCC)   2. Nonrheumatic aortic valve stenosis   3. Coronary artery calcification seen on CT scan   4. Mixed hyperlipidemia    PLAN:    In order of problems  listed above:  1. The patient is back in sinus rhythm today.  This is confirmed by his EKG.  He will remain on diltiazem 120 mg daily.  He will remain on apixaban 5 mg twice daily.  We reviewed indications for anticoagulation.  If he has recurrence of atrial fibrillation I would be inclined to put him on an antiarrhythmic drug.  Might choose Multitak if we can get it covered by his insurance.  For now we will stick with the current strategy. 2. The patient has mild aortic  stenosis.  Most recent echo is reviewed.  We will continue with surveillance. 3. No anginal symptoms 4. He is treated with a statin drug.  Most recent lipids are reviewed with a cholesterol of 163, HDL 67, LDL 87.   Medication Adjustments/Labs and Tests Ordered: Current medicines are reviewed at length with the patient today.  Concerns regarding medicines are outlined above.  Orders Placed This Encounter  Procedures  . EKG 12-Lead   No orders of the defined types were placed in this encounter.   Patient Instructions  Medication Instructions:  Your provider recommends that you continue on your current medications as directed. Please refer to the Current Medication list given to you today.    Labwork: None  Testing/Procedures: None  Follow-Up: You have an appointment with Richardson Dopp on April 28, 2019 at 10:15AM.      Signed, Sherren Mocha, MD  11/05/2018 11:00 PM    [place]

## 2018-11-16 DIAGNOSIS — Z9889 Other specified postprocedural states: Secondary | ICD-10-CM | POA: Diagnosis not present

## 2018-11-16 DIAGNOSIS — M4316 Spondylolisthesis, lumbar region: Secondary | ICD-10-CM | POA: Diagnosis not present

## 2018-11-16 DIAGNOSIS — R531 Weakness: Secondary | ICD-10-CM | POA: Diagnosis not present

## 2018-11-16 DIAGNOSIS — M545 Low back pain: Secondary | ICD-10-CM | POA: Diagnosis not present

## 2018-11-16 DIAGNOSIS — M5416 Radiculopathy, lumbar region: Secondary | ICD-10-CM | POA: Diagnosis not present

## 2018-11-16 DIAGNOSIS — M1611 Unilateral primary osteoarthritis, right hip: Secondary | ICD-10-CM | POA: Diagnosis not present

## 2018-11-16 DIAGNOSIS — M79651 Pain in right thigh: Secondary | ICD-10-CM | POA: Diagnosis not present

## 2018-11-16 DIAGNOSIS — R1031 Right lower quadrant pain: Secondary | ICD-10-CM | POA: Diagnosis not present

## 2018-11-16 DIAGNOSIS — M4186 Other forms of scoliosis, lumbar region: Secondary | ICD-10-CM | POA: Diagnosis not present

## 2018-11-16 DIAGNOSIS — M2578 Osteophyte, vertebrae: Secondary | ICD-10-CM | POA: Diagnosis not present

## 2018-11-20 DIAGNOSIS — M48061 Spinal stenosis, lumbar region without neurogenic claudication: Secondary | ICD-10-CM | POA: Diagnosis not present

## 2018-11-23 ENCOUNTER — Ambulatory Visit: Payer: Managed Care, Other (non HMO) | Admitting: Physician Assistant

## 2018-12-09 DIAGNOSIS — Z9889 Other specified postprocedural states: Secondary | ICD-10-CM | POA: Diagnosis not present

## 2018-12-09 DIAGNOSIS — M5126 Other intervertebral disc displacement, lumbar region: Secondary | ICD-10-CM | POA: Diagnosis not present

## 2018-12-09 DIAGNOSIS — M47816 Spondylosis without myelopathy or radiculopathy, lumbar region: Secondary | ICD-10-CM | POA: Diagnosis not present

## 2018-12-09 DIAGNOSIS — M4316 Spondylolisthesis, lumbar region: Secondary | ICD-10-CM | POA: Diagnosis not present

## 2018-12-09 DIAGNOSIS — M48061 Spinal stenosis, lumbar region without neurogenic claudication: Secondary | ICD-10-CM | POA: Diagnosis not present

## 2018-12-09 DIAGNOSIS — M5136 Other intervertebral disc degeneration, lumbar region: Secondary | ICD-10-CM | POA: Diagnosis not present

## 2018-12-09 DIAGNOSIS — R531 Weakness: Secondary | ICD-10-CM | POA: Diagnosis not present

## 2018-12-10 DIAGNOSIS — E7800 Pure hypercholesterolemia, unspecified: Secondary | ICD-10-CM | POA: Diagnosis not present

## 2018-12-10 DIAGNOSIS — Z125 Encounter for screening for malignant neoplasm of prostate: Secondary | ICD-10-CM | POA: Diagnosis not present

## 2018-12-14 DIAGNOSIS — I6523 Occlusion and stenosis of bilateral carotid arteries: Secondary | ICD-10-CM | POA: Diagnosis present

## 2018-12-14 DIAGNOSIS — M5136 Other intervertebral disc degeneration, lumbar region: Secondary | ICD-10-CM | POA: Diagnosis not present

## 2018-12-14 DIAGNOSIS — G588 Other specified mononeuropathies: Secondary | ICD-10-CM | POA: Diagnosis present

## 2018-12-14 DIAGNOSIS — E785 Hyperlipidemia, unspecified: Secondary | ICD-10-CM | POA: Diagnosis present

## 2018-12-14 DIAGNOSIS — I350 Nonrheumatic aortic (valve) stenosis: Secondary | ICD-10-CM | POA: Diagnosis present

## 2018-12-14 DIAGNOSIS — M5126 Other intervertebral disc displacement, lumbar region: Secondary | ICD-10-CM | POA: Diagnosis not present

## 2018-12-14 DIAGNOSIS — Z79899 Other long term (current) drug therapy: Secondary | ICD-10-CM | POA: Diagnosis not present

## 2018-12-14 DIAGNOSIS — M4316 Spondylolisthesis, lumbar region: Secondary | ICD-10-CM | POA: Diagnosis not present

## 2018-12-14 DIAGNOSIS — R7303 Prediabetes: Secondary | ICD-10-CM | POA: Diagnosis present

## 2018-12-14 DIAGNOSIS — M48061 Spinal stenosis, lumbar region without neurogenic claudication: Secondary | ICD-10-CM | POA: Diagnosis present

## 2018-12-14 DIAGNOSIS — M532X6 Spinal instabilities, lumbar region: Secondary | ICD-10-CM | POA: Diagnosis not present

## 2018-12-14 DIAGNOSIS — Z87891 Personal history of nicotine dependence: Secondary | ICD-10-CM | POA: Diagnosis not present

## 2018-12-14 DIAGNOSIS — I4891 Unspecified atrial fibrillation: Secondary | ICD-10-CM | POA: Diagnosis present

## 2018-12-14 DIAGNOSIS — Z9889 Other specified postprocedural states: Secondary | ICD-10-CM | POA: Diagnosis not present

## 2018-12-14 DIAGNOSIS — Z7901 Long term (current) use of anticoagulants: Secondary | ICD-10-CM | POA: Diagnosis not present

## 2018-12-14 DIAGNOSIS — M5116 Intervertebral disc disorders with radiculopathy, lumbar region: Secondary | ICD-10-CM | POA: Diagnosis present

## 2019-01-08 DIAGNOSIS — Z981 Arthrodesis status: Secondary | ICD-10-CM | POA: Diagnosis not present

## 2019-01-08 DIAGNOSIS — M545 Low back pain: Secondary | ICD-10-CM | POA: Diagnosis not present

## 2019-02-05 DIAGNOSIS — R972 Elevated prostate specific antigen [PSA]: Secondary | ICD-10-CM | POA: Diagnosis not present

## 2019-02-05 DIAGNOSIS — N400 Enlarged prostate without lower urinary tract symptoms: Secondary | ICD-10-CM | POA: Diagnosis not present

## 2019-02-05 DIAGNOSIS — E7800 Pure hypercholesterolemia, unspecified: Secondary | ICD-10-CM | POA: Diagnosis not present

## 2019-02-05 DIAGNOSIS — D649 Anemia, unspecified: Secondary | ICD-10-CM | POA: Diagnosis not present

## 2019-02-05 DIAGNOSIS — R739 Hyperglycemia, unspecified: Secondary | ICD-10-CM | POA: Diagnosis not present

## 2019-02-05 DIAGNOSIS — Z125 Encounter for screening for malignant neoplasm of prostate: Secondary | ICD-10-CM | POA: Diagnosis not present

## 2019-02-10 DIAGNOSIS — E7800 Pure hypercholesterolemia, unspecified: Secondary | ICD-10-CM | POA: Diagnosis not present

## 2019-02-10 DIAGNOSIS — R972 Elevated prostate specific antigen [PSA]: Secondary | ICD-10-CM | POA: Diagnosis not present

## 2019-02-10 DIAGNOSIS — D649 Anemia, unspecified: Secondary | ICD-10-CM | POA: Diagnosis not present

## 2019-02-10 DIAGNOSIS — Z0000 Encounter for general adult medical examination without abnormal findings: Secondary | ICD-10-CM | POA: Diagnosis not present

## 2019-02-10 DIAGNOSIS — I350 Nonrheumatic aortic (valve) stenosis: Secondary | ICD-10-CM | POA: Diagnosis not present

## 2019-02-10 DIAGNOSIS — M169 Osteoarthritis of hip, unspecified: Secondary | ICD-10-CM | POA: Diagnosis not present

## 2019-02-10 DIAGNOSIS — M545 Low back pain: Secondary | ICD-10-CM | POA: Diagnosis not present

## 2019-02-10 DIAGNOSIS — R739 Hyperglycemia, unspecified: Secondary | ICD-10-CM | POA: Diagnosis not present

## 2019-02-26 DIAGNOSIS — L03115 Cellulitis of right lower limb: Secondary | ICD-10-CM | POA: Diagnosis not present

## 2019-02-26 DIAGNOSIS — S80811A Abrasion, right lower leg, initial encounter: Secondary | ICD-10-CM | POA: Diagnosis not present

## 2019-03-10 DIAGNOSIS — Z981 Arthrodesis status: Secondary | ICD-10-CM | POA: Diagnosis not present

## 2019-03-10 DIAGNOSIS — M5126 Other intervertebral disc displacement, lumbar region: Secondary | ICD-10-CM | POA: Diagnosis not present

## 2019-04-05 DIAGNOSIS — L570 Actinic keratosis: Secondary | ICD-10-CM | POA: Diagnosis not present

## 2019-04-05 DIAGNOSIS — L820 Inflamed seborrheic keratosis: Secondary | ICD-10-CM | POA: Diagnosis not present

## 2019-04-27 ENCOUNTER — Encounter: Payer: Self-pay | Admitting: Physician Assistant

## 2019-04-27 ENCOUNTER — Telehealth: Payer: Self-pay | Admitting: Physician Assistant

## 2019-04-27 NOTE — Telephone Encounter (Signed)
New Message   Patient is returning call about virtual visit on tomorrow. Please give patient a call back.

## 2019-04-27 NOTE — Progress Notes (Signed)
Virtual Visit via Telephone Note   This visit type was conducted due to national recommendations for restrictions regarding the COVID-19 Pandemic (e.g. social distancing) in an effort to limit this patient's exposure and mitigate transmission in our community.  Due to his co-morbid illnesses, this patient is at least at moderate risk for complications without adequate follow up.  This format is felt to be most appropriate for this patient at this time.  The patient did not have access to video technology/had technical difficulties with video requiring transitioning to audio format only (telephone).  All issues noted in this document were discussed and addressed.  No physical exam could be performed with this format.  Please refer to the patient's chart for his  consent to telehealth for [facility].   Date:  04/28/2019   ID:  Cody Mcconnell, DOB [birth year], MRN [id number]  Patient Location: Home Provider Location: Home  PCP:  Cody Gravel, MD  Cardiologist:  Sherren Mocha, MD   Electrophysiologist:  None   Evaluation Performed:  Follow-Up Visit  Chief Complaint: Atrial fibrillation  History of Present Illness:    Cody Mcconnell is a 73 y.o. male with:  Paroxysmal atrial fibrillation  CHADS2-VASc=2 (age x 1, vascular dz) >> Eliquis   Hyperlipidemia  Aortic stenosis  Coronary Ca on CT  FHx of Coronary artery disease   Hx of back surgery x 2 in 2019  Cody Mcconnell was last seen by Dr. Burt Knack in 10/2018.  He had called in previous to that appt with symptoms of recurrent atrial fibrillation.  He was placed on Diltiazem with resolution of his symptoms.  He was in normal sinus rhythm at the time of his appt with Dr. Burt Knack.  Today, he notes he is doing well.  He remains active without chest discomfort, shortness of breath.  He has not had syncope or near syncope.  He has not had any bleeding issues.  He has an apple watch and has not had any recorded episodes of atrial  fibrillation since February.  The patient does not have symptoms concerning for COVID-19 infection (fever, chills, cough, or new shortness of breath).    Past Medical History:  Diagnosis Date   Anemia    SLIGHT ANEMIA 4 MONTHS AGO   Aortic valve stenosis 12/19/2017   Echo 07/30/16 - EF 55-60, normal wall motion, normal diastolic function, mild aortic stenosis (mean 9, peak 16), mild LAE // Echo 4/19:  EF 60-65, no RWMA, Gr 2 DD, mild AS (mean 11, peak 24)    Arthritis    OA   Benign prostate hyperplasia    unspecified whether lower urinary tract sym. present    Carotid atherosclerosis    Carotid US 4/19:  bilat ICA 1-39; vertebrals and subclavians normal   Dysrhythmia    ATRIAL FIB   Glucose intolerance    "pre-diabetic"   Hayfever    Heart murmur    History of exercise stress test    GXT 4/19:  ETT with good exercise tolerance (10:00); no chest pain; normal BP response; no diagnostic ST changes; negative adequate ETT.   HOH (hard of hearing)    BOTH EARS   Numbness of right thumb    FROM CERVICAL NECK SURGERY   Pre-diabetes    Prostatitis    unspecified prostatitis type   PSA elevation    Pure hypercholesterolemia    Past Surgical History:  Procedure Laterality Date   CERVICAL SPINE SURGERY  2011   COLONOSCOPY  HIP SURGERY Right    arthroscopy   LUMBAR DISC SURGERY  2014   L2-3, L3 TO L4 X 2 2019 AT [facility]   SHOULDER ARTHROSCOPY Right    TOTAL HIP ARTHROPLASTY Right 09/29/2018   Procedure: TOTAL HIP ARTHROPLASTY ANTERIOR APPROACH;  Surgeon: Paralee Cancel, MD;  Location: [facility];  Service: Orthopedics;  Laterality: Right;  71     Current Meds  Medication Sig   cetirizine (ZYRTEC) 10 MG tablet Take 10 mg by mouth every other day.   diltiazem (CARDIZEM CD) 120 MG 24 hr capsule Take 1 capsule (120 mg total) by mouth daily.   fexofenadine (ALLEGRA ALLERGY) 180 MG tablet Take 180 mg by mouth every other day.   fluticasone (FLONASE) 50  MCG/ACT nasal spray Place 1 spray into both nostrils 2 (two) times daily.    Multiple Vitamins-Minerals (MULTIVITAMIN ADULT PO) Take 1 tablet by mouth daily.   Probiotic Product (PROBIOTIC DAILY PO) Take 1 tablet by mouth every other day.    simvastatin (ZOCOR) 40 MG tablet Take 40 mg by mouth daily.   TURMERIC PO Take 1 tablet by mouth daily.   [DISCONTINUED] apixaban (ELIQUIS) 5 MG TABS tablet Take 1 tablet (5 mg total) by mouth 2 (two) times daily.     Allergies:   Patient has no known allergies.   Social History   Tobacco Use   Smoking status: Former Smoker    Packs/day: 2.00    Years: 9.00    Pack years: 18.00   Smokeless tobacco: Never Used   Tobacco comment: QUIT 1081  Substance Use Topics   Alcohol use: Never    Frequency: Never   Drug use: Never     Family Hx: The patient's family history includes CAD in his father; CAD (age of onset: 84) in his brother; Congestive Heart Failure in his father; Dementia in his mother.  ROS:   Please see the history of present illness.     All other systems reviewed and are negative.   Prior CV studies:   The following studies were reviewed today:   Chest CTA 06/01/18 - No significant imaging evidence of pulmonary embolic disease - A moderate burden of calcified atherosclerotic disease is evident within the coronary arterial distribution. - There is diffuse very mild thickening of the esophageal wall - No findings of trauma involving the thorax - No pneumothorax - Diffuse bronchial wall thickening is suggestive of infectious/inflammatory airway disease.  Carotid US 12/31/17 Final Interpretation: Right Carotid: Velocities in the right ICA are consistent with a 1-39% stenosis. Left Carotid: Velocities in the left ICA are consistent with a 1-39% stenosis.       The extracranial vessels were near-normal with only minimal wall       thickening or plaque. Vertebrals: Bilateral vertebral arteries  demonstrate antegrade flow. Subclavians: Normal flow hemodynamics were seen in bilateral subclavian       arteries.  GXT 12/31/17 ETT with good exercise tolerance (10:00); no chest pain; normal BP response; no diagnostic ST changes; negative adequate ETT.  Echo 12/31/17 EF 60-65, no RWMA, Gr 2 DD, mild AS (mean 11, peak 24)  Echo 07/30/16 EF 55-60, normal wall motion, normal diastolic function, mild aortic stenosis (mean 9, peak 16), mild LAE  Labs/Other Tests and Data Reviewed:    EKG:  No ECG reviewed.  Recent Labs: 09/30/2018: BUN 16; Creatinine, Ser 0.84; Hemoglobin 10.6; Platelets 220; Potassium 3.6; Sodium 139   Recent Lipid Panel No results found for: CHOL, TRIG, HDL, CHOLHDL,  LDLCALC, LDLDIRECT  Wt Readings from Last 3 Encounters:  04/28/19 178 lb (80.7 kg)  11/05/18 182 lb 1.9 oz (82.6 kg)  09/29/18 178 lb (80.7 kg)     Objective:    Vital Signs:  Ht 5\' 9"  (1.753 m)    Wt 178 lb (80.7 kg)    BMI 26.29 kg/m    VITAL SIGNS:  reviewed GEN:  no acute distress RESPIRATORY:  No labored breathing NEURO:  Alert and oriented PSYCH:  Normal mood  ASSESSMENT & PLAN:    1. Paroxysmal atrial fibrillation (HCC) Overall, maintaining normal sinus rhythm.  He is tolerating anticoagulation.  However, he has difficulty remembering his evening dose of Eliquis.  We discussed switching Eliquis to Xarelto.  He would like to make this change.  Therefore, he will stop his Eliquis and start Xarelto 20 mg daily.  Arrange follow-up BMET, CBC in 6 weeks.  Follow-up with Dr. Burt Knack or me in 6 months.  He can follow-up virtually or in person for his next visit.  2. Nonrheumatic aortic valve stenosis Mild by previous echo.  No symptoms to suggest significant worsening.  Consider repeat echo in the next 1 to 2 years.    3. Coronary artery calcification seen on CT scan Negative adequate exercise treadmill test in April 2019.  He has not had any anginal symptoms.  He is not on aspirin  as he is on direct oral anticoagulant.  Continue statin therapy.  4. Educated About Covid-19 Virus Infection He owns several assisted-living facilities.  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  He residents died in 36 of his employees passed away. The signs and symptoms of COVID-19 were discussed with the patient and how to seek care for testing (follow up with PCP or arrange E-visit).  The importance of social distancing was discussed today.  Time:   Today, I have spent 17 minutes with the patient with telehealth technology discussing the above problems.     Medication Adjustments/Labs and Tests Ordered: Current medicines are reviewed at length with the patient today.  Concerns regarding medicines are outlined above.   Tests Ordered: Orders Placed This Encounter  Procedures   Basic metabolic panel   CBC    Medication Changes: Meds ordered this encounter  Medications   rivaroxaban (XARELTO) 20 MG TABS tablet    Sig: Take 1 tablet (20 mg total) by mouth daily with supper.    Dispense:  30 tablet    Refill:  6    Follow Up:  Virtual Visit or In Person in 6 month(s)  Signed, Richardson Dopp, PA-C  04/28/2019 4:36 PM    [facility]

## 2019-04-28 ENCOUNTER — Other Ambulatory Visit: Payer: Self-pay

## 2019-04-28 ENCOUNTER — Telehealth (INDEPENDENT_AMBULATORY_CARE_PROVIDER_SITE_OTHER): Payer: Medicare Other | Admitting: Physician Assistant

## 2019-04-28 ENCOUNTER — Encounter: Payer: Self-pay | Admitting: Physician Assistant

## 2019-04-28 ENCOUNTER — Telehealth: Payer: Self-pay | Admitting: *Deleted

## 2019-04-28 VITALS — Ht 69.0 in | Wt 178.0 lb

## 2019-04-28 DIAGNOSIS — Z7189 Other specified counseling: Secondary | ICD-10-CM

## 2019-04-28 DIAGNOSIS — I480 Paroxysmal atrial fibrillation: Secondary | ICD-10-CM

## 2019-04-28 DIAGNOSIS — I2510 Atherosclerotic heart disease of native coronary artery without angina pectoris: Secondary | ICD-10-CM

## 2019-04-28 DIAGNOSIS — I350 Nonrheumatic aortic (valve) stenosis: Secondary | ICD-10-CM

## 2019-04-28 MED ORDER — RIVAROXABAN 20 MG PO TABS: 20.0000 mg | ORAL_TABLET | Freq: Every day | ORAL | 6 refills | Status: DC

## 2019-04-28 NOTE — Patient Instructions (Signed)
Medication Instructions:   Your physician has recommended you make the following change in your medication:   1) Stop Eliquis 5MG ; 1 tablet by mouth twice a day 2) Start Xarelto 20MG ; 1 tablet by mouth once a day  If you need a refill on your cardiac medications before your next appointment, please call your pharmacy.   Lab work:  Your physician recommends that you return for lab work in: 6 weeks on 06/08/29 at 9:15AM for a CBC and BMET  If you have labs (blood work) drawn today and your tests are completely normal, you will receive your results only by: Marland Kitchen MyChart Message (if you have MyChart) OR . A paper copy in the mail If you have any lab test that is abnormal or we need to change your treatment, we will call you to review the results.  Testing/Procedures:  None ordered today  Follow-Up: At [facility], you and your health needs are our priority.  As part of our continuing mission to provide you with exceptional heart care, we have created designated Provider Care Teams.  These Care Teams include your primary Cardiologist (physician) and Advanced Practice Providers (APPs -  Physician Assistants and Nurse Practitioners) who all work together to provide you with the care you need, when you need it. You will need a follow up appointment in:  6 months.  Please call our office 2 months in advance to schedule this appointment.  You may see Sherren Mocha, MD or one of the following Advanced Practice Providers on your designated Care Team: Richardson Dopp, PA-C [place], [place] . Daune Perch, NP

## 2019-04-28 NOTE — Telephone Encounter (Signed)
Virtual Visit Pre-Appointment Phone Call  "(Name), I am calling you today to discuss your upcoming appointment. We are currently trying to limit exposure to the virus that causes COVID-19 by seeing patients at home rather than in the office."  1. "What is the BEST phone number to call the day of the visit?" - include this in appointment notes  2. "Do you have or have access to (through a family member/friend) a smartphone with video capability that we can use for your visit?" a. If yes - list this number in appt notes as "cell" (if different from BEST phone #) and list the appointment type as a VIDEO visit in appointment notes b. If no - list the appointment type as a PHONE visit in appointment notes  Confirm consent - "In the setting of the current Covid19 crisis, you are scheduled for a (phone or video) visit with your provider on (date) at (time).  Just as we do with many in-office visits, in order for you to participate in this visit, we must obtain consent.  If you'd like, I can send this to your mychart (if signed up) or email for you to review.  Otherwise, I can obtain your verbal consent now.  All virtual visits are billed to your insurance company just like a normal visit would be.  By agreeing to a virtual visit, we'd like you to understand that the technology does not allow for your provider to perform an examination, and thus may limit your provider's ability to fully assess your condition. If your provider identifies any concerns that need to be evaluated in person, we will make arrangements to do so.  Finally, though the technology is pretty good, we cannot assure that it will always work on either your or our end, and in the setting of a video visit, we may have to convert it to a phone-only visit.  In either situation, we cannot ensure that we have a secure connection.  Are you willing to proceed?" STAFF: Did the patient verbally acknowledge consent to telehealth visit? Document  YES/NO here: Yes 3. Advise patient to be prepared - "Two hours prior to your appointment, go ahead and check your blood pressure, pulse, oxygen saturation, and your weight (if you have the equipment to check those) and write them all down. When your visit starts, your provider will ask you for this information. If you have an Apple Watch or Kardia device, please plan to have heart rate information ready on the day of your appointment. Please have a pen and paper handy nearby the day of the visit as well."  4. Give patient instructions for MyChart download to smartphone OR Doximity/Doxy.me as below if video visit (depending on what platform provider is using)  5. Inform patient they will receive a phone call 15 minutes prior to their appointment time (may be from unknown caller ID) so they should be prepared to answer    TELEPHONE CALL NOTE  Cody Mcconnell has been deemed a candidate for a follow-up tele-health visit to limit community exposure during the Covid-19 pandemic. I spoke with the patient via phone to ensure availability of phone/video source, confirm preferred email & phone number, and discuss instructions and expectations.  I reminded Cody Mcconnell to be prepared with any vital sign and/or heart rhythm information that could potentially be obtained via home monitoring, at the time of his visit. I reminded Cody Mcconnell to expect a phone call prior to his visit.  [name] 04/28/2019 10:13 AM   INSTRUCTIONS FOR DOWNLOADING THE MYCHART APP TO SMARTPHONE  - The patient must first make sure to have activated MyChart and know their login information - If Apple, go to [employer] and type in MyChart in the search bar and download the app. If Android, ask patient to go to [employer] and type in [place] in the search bar and download the app. The app is free but as with any other app downloads, their phone may require them to verify saved payment information or  Apple/Android password.  - The patient will need to then log into the app with their MyChart username and password, and select [facility] as their healthcare provider to link the account. When it is time for your visit, go to the MyChart app, find appointments, and click Begin Video Visit. Be sure to Select Allow for your device to access the Microphone and Camera for your visit. You will then be connected, and your provider will be with you shortly.  **If they have any issues connecting, or need assistance please contact MyChart service desk [phone])**  **If using a computer, in order to ensure the best quality for their visit they will need to use either of the following Internet Browsers: [employer], or Google Chrome**  IF USING DOXIMITY or DOXY.ME - The patient will receive a link just prior to their visit by text.     FULL LENGTH CONSENT FOR TELE-HEALTH VISIT   I hereby voluntarily request, consent and authorize [facility] and its employed or contracted physicians, physician assistants, nurse practitioners or other licensed health care professionals (the Practitioner), to provide me with telemedicine health care services (the "Services") as deemed necessary by the treating Practitioner. I acknowledge and consent to receive the Services by the Practitioner via telemedicine. I understand that the telemedicine visit will involve communicating with the Practitioner through live audiovisual communication technology and the disclosure of certain medical information by electronic transmission. I acknowledge that I have been given the opportunity to request an in-person assessment or other available alternative prior to the telemedicine visit and am voluntarily participating in the telemedicine visit.  I understand that I have the right to withhold or withdraw my consent to the use of telemedicine in the course of my care at any time, without affecting my right to future care  or treatment, and that the Practitioner or I may terminate the telemedicine visit at any time. I understand that I have the right to inspect all information obtained and/or recorded in the course of the telemedicine visit and may receive copies of available information for a reasonable fee.  I understand that some of the potential risks of receiving the Services via telemedicine include:  Marland Kitchen Delay or interruption in medical evaluation due to technological equipment failure or disruption; . Information transmitted may not be sufficient (e.g. poor resolution of images) to allow for appropriate medical decision making by the Practitioner; and/or  . In rare instances, security protocols could fail, causing a breach of personal health information.  Furthermore, I acknowledge that it is my responsibility to provide information about my medical history, conditions and care that is complete and accurate to the best of my ability. I acknowledge that Practitioner's advice, recommendations, and/or decision may be based on factors not within their control, such as incomplete or inaccurate data provided by me or distortions of diagnostic images or specimens that may result from electronic transmissions. I understand that the practice  of medicine is not an Chief Strategy Officer and that Practitioner makes no warranties or guarantees regarding treatment outcomes. I acknowledge that I will receive a copy of this consent concurrently upon execution via email to the email address I last provided but may also request a printed copy by calling the office of [facility].    I understand that my insurance will be billed for this visit.   I have read or had this consent read to me. . I understand the contents of this consent, which adequately explains the benefits and risks of the Services being provided via telemedicine.  . I have been provided ample opportunity to ask questions regarding this consent and the Services and have had  my questions answered to my satisfaction. . I give my informed consent for the services to be provided through the use of telemedicine in my medical care  By participating in this telemedicine visit I agree to the above.

## 2019-05-05 DIAGNOSIS — Z0000 Encounter for general adult medical examination without abnormal findings: Secondary | ICD-10-CM | POA: Diagnosis not present

## 2019-05-05 DIAGNOSIS — M48061 Spinal stenosis, lumbar region without neurogenic claudication: Secondary | ICD-10-CM | POA: Diagnosis not present

## 2019-05-05 DIAGNOSIS — I10 Essential (primary) hypertension: Secondary | ICD-10-CM | POA: Diagnosis not present

## 2019-05-05 DIAGNOSIS — M1611 Unilateral primary osteoarthritis, right hip: Secondary | ICD-10-CM | POA: Diagnosis not present

## 2019-05-05 DIAGNOSIS — R739 Hyperglycemia, unspecified: Secondary | ICD-10-CM | POA: Diagnosis not present

## 2019-05-05 DIAGNOSIS — M5126 Other intervertebral disc displacement, lumbar region: Secondary | ICD-10-CM | POA: Diagnosis not present

## 2019-05-05 DIAGNOSIS — M5137 Other intervertebral disc degeneration, lumbosacral region: Secondary | ICD-10-CM | POA: Diagnosis not present

## 2019-05-05 DIAGNOSIS — E785 Hyperlipidemia, unspecified: Secondary | ICD-10-CM | POA: Diagnosis not present

## 2019-05-05 DIAGNOSIS — M545 Low back pain: Secondary | ICD-10-CM | POA: Diagnosis not present

## 2019-05-05 DIAGNOSIS — Z7901 Long term (current) use of anticoagulants: Secondary | ICD-10-CM | POA: Diagnosis not present

## 2019-05-05 DIAGNOSIS — R1030 Lower abdominal pain, unspecified: Secondary | ICD-10-CM | POA: Diagnosis not present

## 2019-06-09 ENCOUNTER — Other Ambulatory Visit: Payer: Self-pay

## 2019-06-09 ENCOUNTER — Other Ambulatory Visit: Payer: Medicare Other | Admitting: *Deleted

## 2019-06-09 DIAGNOSIS — I350 Nonrheumatic aortic (valve) stenosis: Secondary | ICD-10-CM | POA: Diagnosis not present

## 2019-06-09 DIAGNOSIS — I2510 Atherosclerotic heart disease of native coronary artery without angina pectoris: Secondary | ICD-10-CM

## 2019-06-09 DIAGNOSIS — Z7189 Other specified counseling: Secondary | ICD-10-CM | POA: Diagnosis not present

## 2019-06-09 DIAGNOSIS — I480 Paroxysmal atrial fibrillation: Secondary | ICD-10-CM | POA: Diagnosis not present

## 2019-06-09 LAB — BASIC METABOLIC PANEL
BUN/Creatinine Ratio: 17 (ref 10–24)
BUN: 16 mg/dL (ref 8–27)
CO2: 22 mmol/L (ref 20–29)
Calcium: 9.4 mg/dL (ref 8.6–10.2)
Chloride: 106 mmol/L (ref 96–106)
Creatinine, Ser: 0.95 mg/dL (ref 0.76–1.27)
GFR calc Af Amer: 92 mL/min/{1.73_m2} (ref >59)
GFR calc non Af Amer: 80 mL/min/{1.73_m2} (ref >59)
Glucose: 100 mg/dL — ABNORMAL HIGH (ref 65–99)
Potassium: 4.4 mmol/L (ref 3.5–5.2)
Sodium: 139 mmol/L (ref 134–144)

## 2019-06-09 LAB — CBC
Hematocrit: 37.1 % — ABNORMAL LOW (ref 37.5–51.0)
Hemoglobin: 12.1 g/dL — ABNORMAL LOW (ref 13.0–17.7)
MCH: 24 pg — ABNORMAL LOW (ref 26.6–33.0)
MCHC: 32.6 g/dL (ref 31.5–35.7)
MCV: 74 fL — ABNORMAL LOW (ref 79–97)
Platelets: 286 10*3/uL (ref 150–450)
RBC: 5.04 x10E6/uL (ref 4.14–5.80)
RDW: 15 % (ref 11.6–15.4)
WBC: 5.9 10*3/uL (ref 3.4–10.8)

## 2019-06-12 DIAGNOSIS — H43813 Vitreous degeneration, bilateral: Secondary | ICD-10-CM | POA: Diagnosis not present

## 2019-06-12 DIAGNOSIS — H5202 Hypermetropia, left eye: Secondary | ICD-10-CM | POA: Diagnosis not present

## 2019-06-27 ENCOUNTER — Other Ambulatory Visit: Payer: Self-pay | Admitting: Physician Assistant

## 2019-06-28 ENCOUNTER — Other Ambulatory Visit: Payer: Self-pay | Admitting: Physician Assistant

## 2019-06-28 NOTE — Telephone Encounter (Signed)
Pt's medication was sent to pt's pharmacy as requested. Confirmation received.  °

## 2019-07-28 DIAGNOSIS — Z202 Contact with and (suspected) exposure to infections with a predominantly sexual mode of transmission: Secondary | ICD-10-CM | POA: Diagnosis not present

## 2019-07-28 DIAGNOSIS — R300 Dysuria: Secondary | ICD-10-CM | POA: Diagnosis not present

## 2019-08-10 DIAGNOSIS — E785 Hyperlipidemia, unspecified: Secondary | ICD-10-CM | POA: Diagnosis not present

## 2019-08-10 DIAGNOSIS — Z23 Encounter for immunization: Secondary | ICD-10-CM | POA: Diagnosis not present

## 2019-08-10 DIAGNOSIS — N390 Urinary tract infection, site not specified: Secondary | ICD-10-CM | POA: Diagnosis not present

## 2019-09-06 ENCOUNTER — Encounter: Payer: Self-pay | Admitting: *Deleted

## 2019-09-13 DIAGNOSIS — D485 Neoplasm of uncertain behavior of skin: Secondary | ICD-10-CM | POA: Diagnosis not present

## 2019-09-13 DIAGNOSIS — Z85828 Personal history of other malignant neoplasm of skin: Secondary | ICD-10-CM | POA: Diagnosis not present

## 2019-09-13 DIAGNOSIS — L821 Other seborrheic keratosis: Secondary | ICD-10-CM | POA: Diagnosis not present

## 2019-09-13 DIAGNOSIS — L578 Other skin changes due to chronic exposure to nonionizing radiation: Secondary | ICD-10-CM | POA: Diagnosis not present

## 2019-09-13 DIAGNOSIS — L570 Actinic keratosis: Secondary | ICD-10-CM | POA: Diagnosis not present

## 2019-09-13 DIAGNOSIS — Z23 Encounter for immunization: Secondary | ICD-10-CM | POA: Diagnosis not present

## 2019-09-13 DIAGNOSIS — D225 Melanocytic nevi of trunk: Secondary | ICD-10-CM | POA: Diagnosis not present

## 2019-09-13 DIAGNOSIS — L814 Other melanin hyperpigmentation: Secondary | ICD-10-CM | POA: Diagnosis not present

## 2019-09-13 DIAGNOSIS — R234 Changes in skin texture: Secondary | ICD-10-CM | POA: Diagnosis not present

## 2019-09-14 DIAGNOSIS — J300 Vasomotor rhinitis: Secondary | ICD-10-CM | POA: Diagnosis not present

## 2019-09-14 DIAGNOSIS — H1045 Other chronic allergic conjunctivitis: Secondary | ICD-10-CM | POA: Diagnosis not present

## 2019-09-14 DIAGNOSIS — J309 Allergic rhinitis, unspecified: Secondary | ICD-10-CM | POA: Diagnosis not present

## 2019-09-15 ENCOUNTER — Ambulatory Visit (INDEPENDENT_AMBULATORY_CARE_PROVIDER_SITE_OTHER): Payer: Medicare Other | Admitting: Physician Assistant

## 2019-09-15 ENCOUNTER — Other Ambulatory Visit: Payer: Self-pay

## 2019-09-15 ENCOUNTER — Encounter: Payer: Self-pay | Admitting: Physician Assistant

## 2019-09-15 VITALS — BP 132/68 | HR 77 | Ht 69.0 in | Wt 181.8 lb

## 2019-09-15 DIAGNOSIS — I2510 Atherosclerotic heart disease of native coronary artery without angina pectoris: Secondary | ICD-10-CM | POA: Diagnosis not present

## 2019-09-15 DIAGNOSIS — I10 Essential (primary) hypertension: Secondary | ICD-10-CM

## 2019-09-15 DIAGNOSIS — E785 Hyperlipidemia, unspecified: Secondary | ICD-10-CM

## 2019-09-15 DIAGNOSIS — I480 Paroxysmal atrial fibrillation: Secondary | ICD-10-CM | POA: Diagnosis not present

## 2019-09-15 DIAGNOSIS — I350 Nonrheumatic aortic (valve) stenosis: Secondary | ICD-10-CM | POA: Diagnosis not present

## 2019-09-15 DIAGNOSIS — R42 Dizziness and giddiness: Secondary | ICD-10-CM | POA: Diagnosis not present

## 2019-09-15 MED ORDER — APIXABAN 5 MG PO TABS: 5.0000 mg | ORAL_TABLET | Freq: Two times a day (BID) | ORAL | 12 refills | Status: DC

## 2019-09-15 NOTE — Progress Notes (Signed)
Cardiology Office Note:    Date:  09/15/2019   ID:  Cody Mcconnell, DOB [birth year], MRN [id number]  PCP:  Cody Gravel, MD  Cardiologist:  Cody Mocha, MD  Electrophysiologist:  None   Referring MD: Cody Gravel, MD   Chief Complaint  Patient presents with  . Follow-up    AFib, coronary calcification     History of Present Illness:    Cody Mcconnell is a 74 y.o. male with:   Paroxysmal atrial fibrillation ? CHADS2-VASc=2 (age x 1, vascular dz) >> Eliquis   Hyperlipidemia  Aortic stenosis  Coronary Ca on CT  FHx of Coronary artery disease   Hx of back surgery x 2 in 2019  Cody Mcconnell returns for follow-up.  He is here alone.  He owns several nursing homes.  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  This has been quite stressful for him.  He has not had any chest discomfort or shortness of breath.  He has noted excessive bruising since he switched from apixaban to rivaroxaban.  He would like to switch back to apixaban.      Prior CV studies:   The following studies were reviewed today:   Chest CTA 06/01/18 - No significant imaging evidence of pulmonary embolic disease - A moderate burden of calcified atherosclerotic disease is evident within the coronary arterial distribution. - There is diffuse very mild thickening of the esophageal wall - No findings of trauma involving the thorax - No pneumothorax -Diffuse bronchial wall thickening is suggestive of infectious/inflammatory airway disease.  Carotid US 12/31/17 Final Interpretation: Right Carotid: Velocities in the right ICA are consistent with a 1-39% stenosis. Left Carotid: Velocities in the left ICA are consistent with a 1-39% stenosis. The extracranial vessels were near-normal with only minimal wall thickening or plaque. Vertebrals: Bilateral vertebral arteries demonstrate antegrade flow. Subclavians: Normal flow hemodynamics were seen in bilateral  subclavian arteries.  GXT 12/31/17 ETT with good exercise tolerance (10:00); no chest pain; normal BP response; no diagnostic ST changes; negative adequate ETT.  Echo 12/31/17 EF 60-65, no RWMA, Gr 2 DD, mild AS (mean 11, peak 24)  Echo 07/30/16 EF 55-60, normal wall motion, normal diastolic function, mild aortic stenosis (mean 9, peak 16), mild LAE  Past Medical History:  Diagnosis Date  . Anemia    SLIGHT ANEMIA 4 MONTHS AGO  . Aortic valve stenosis 12/19/2017   Echo 07/30/16 - EF 55-60, normal wall motion, normal diastolic function, mild aortic stenosis (mean 9, peak 16), mild LAE // Echo 4/19:  EF 60-65, no RWMA, Gr 2 DD, mild AS (mean 11, peak 24)   . Arthritis    OA  . Benign prostate hyperplasia    unspecified whether lower urinary tract sym. present   . Carotid atherosclerosis    Carotid US 4/19:  bilat ICA 1-39; vertebrals and subclavians normal  . Carotid atherosclerosis, bilateral 04/30/2018   1-39% on 12/2017 [facility] Echo  . Coronary artery calcification seen on CT scan 06/05/2018  . Dysrhythmia    ATRIAL FIB  . Elevated prostate specific antigen (PSA) 08/30/2016  . Glucose intolerance    "pre-diabetic"  . Hayfever   . Heart murmur   . History of exercise stress test    GXT 4/19:  ETT with good exercise tolerance (10:00); no chest pain; normal BP response; no diagnostic ST changes; negative adequate ETT.  Marland Kitchen HOH (hard of hearing)    BOTH EARS  . Low back pain with sciatica 06/02/2012  .  Lumbar herniated disc 01/30/2018  . Mild aortic stenosis 04/30/2018   12/2017 [facility] Echo : Mean gradient (S): 11 mm Hg. Peak gradient (S): 24 mm Hg.  . Numbness of right thumb    FROM CERVICAL NECK SURGERY  . Osteoarthritis of right hip 09/21/2018  . Overweight (BMI 25.0-29.9) 09/30/2018  . Paroxysmal atrial fibrillation ([place]) 06/05/2018  . Prediabetes 04/30/2018  . Prostatitis    unspecified prostatitis type  . Pure hypercholesterolemia    Surgical  Hx: The patient  has a past surgical history that includes Cervical spine surgery (2011); Colonoscopy; Lumbar disc surgery (2014); Shoulder arthroscopy (Right); Hip surgery (Right); and Total hip arthroplasty (Right, 09/29/2018).   Current Medications: Current Meds  Medication Sig  . cetirizine (ZYRTEC) 10 MG tablet Take 10 mg by mouth every other day.  . diltiazem (CARDIZEM CD) 120 MG 24 hr capsule TAKE 1 CAPSULE(120 MG) BY MOUTH DAILY  . fexofenadine (ALLEGRA ALLERGY) 180 MG tablet Take 180 mg by mouth every other day.  . fluticasone (FLONASE) 50 MCG/ACT nasal spray Place 1 spray into both nostrils 2 (two) times daily.   . Multiple Vitamins-Minerals (MULTIVITAMIN ADULT PO) Take 1 tablet by mouth daily.  . simvastatin (ZOCOR) 40 MG tablet Take 40 mg by mouth daily.  . TURMERIC PO Take 1 tablet by mouth daily.  . [DISCONTINUED] rivaroxaban (XARELTO) 20 MG TABS tablet Take 1 tablet (20 mg total) by mouth daily with supper.     Allergies:   Patient has no known allergies.   Social History   Tobacco Use  . Smoking status: Former Smoker    Packs/day: 2.00    Years: 9.00    Pack years: 18.00  . Smokeless tobacco: Never Used  . Tobacco comment: QUIT 1081  Substance Use Topics  . Alcohol use: Never  . Drug use: Never     Family Hx: The patient's family history includes CAD in his father; CAD (age of onset: 25) in his brother; Congestive Heart Failure in his father; Dementia in his mother.  ROS:   Please see the history of present illness.    ROS All other systems reviewed and are negative.   EKGs/Labs/Other Test Reviewed:    EKG:  EKG is  ordered today.  The ekg ordered today demonstrates normal sinus rhythm, sinus arrhythmia (?  Respiratory variability), HR 73, no ST-T wave changes, normal axis, QTC 398, no change from prior tracing  Recent Labs: 06/09/2019: BUN 16; Creatinine, Ser 0.95; Hemoglobin 12.1; Platelets 286; Potassium 4.4; Sodium 139   Recent Lipid Panel No results  found for: CHOL, TRIG, HDL, CHOLHDL, LDLCALC, LDLDIRECT  Physical Exam:    VS:  BP 132/68   Pulse 77   Ht 5\' 9"  (1.753 m)   Wt 181 lb 12.8 oz (82.5 kg)   BMI 26.85 kg/m     Wt Readings from Last 3 Encounters:  09/15/19 181 lb 12.8 oz (82.5 kg)  04/28/19 178 lb (80.7 kg)  11/05/18 182 lb 1.9 oz (82.6 kg)     Physical Exam  Constitutional: He is oriented to person, place, and time. He appears well-developed and well-nourished. No distress.  HENT:  Head: Normocephalic and atraumatic.  Eyes: No scleral icterus.  Neck: No JVD present. No thyromegaly present.  Cardiovascular: Normal rate, regular rhythm, S1 normal and S2 normal.  Murmur heard.  Crescendo-decrescendo systolic murmur is present with a grade of 2/6 at the upper right sternal border. Pulmonary/Chest: Effort normal and breath sounds normal. He has no  rales.  Abdominal: Soft. There is no hepatomegaly.  Musculoskeletal:        General: No edema.  Lymphadenopathy:    He has no cervical adenopathy.  Neurological: He is alert and oriented to person, place, and time.  Skin: Skin is warm and dry.  Psychiatric: He has a normal mood and affect.    ASSESSMENT & PLAN:    1. Paroxysmal atrial fibrillation (HCC) Maintaining normal sinus rhythm.  He has had excessive bruising since switching from apixaban to rivaroxaban.  He would like to switch back to apixaban.  Creatinine, hemoglobin was stable in September 2020.  -DC rivaroxaban  -Start apixaban 5 mg twice daily  -Continue diltiazem  2. Nonrheumatic aortic valve stenosis Mild aortic stenosis by echocardiogram April 2019.  No symptoms to suggest worsening.  He will follow-up in 1 year with a repeat echocardiogram.  3. Coronary artery calcification seen on CT scan No angina.  Stress test in 2019 was low risk.  He is not on aspirin as he is on chronic anticoagulation.  Continue statin therapy.  4. Essential hypertension Blood pressure somewhat elevated today.  Continue to  monitor.  Continue current medications.  5. Hyperlipidemia, unspecified hyperlipidemia type Continue high intensity statin therapy.  Obtain copy of recent labs with PCP.   Dispo:  Return in about 1 year (around 09/14/2020) for Routine Follow Up, w/ Dr. Burt Knack, or Richardson Dopp, PA-C, in person.   Medication Adjustments/Labs and Tests Ordered: Current medicines are reviewed at length with the patient today.  Concerns regarding medicines are outlined above.  Tests Ordered: Orders Placed This Encounter  Procedures  . EKG 12/Charge capture  . ECHOCARDIOGRAM COMPLETE   Medication Changes: Meds ordered this encounter  Medications  . apixaban (ELIQUIS) 5 MG TABS tablet    Sig: Take 1 tablet (5 mg total) by mouth 2 (two) times daily.    Dispense:  60 tablet    Refill:  [address] Richardson Dopp, [place]  09/15/2019 1:07 PM    [facility] [address], [place], [place]  [address] Phone: [phone]; Fax: [phone]

## 2019-09-15 NOTE — Patient Instructions (Signed)
Medication Instructions:   Your physician has recommended you make the following change in your medication:   1) Stop Xarelto 20MG  2) Start Eliquis 5MG , 1 tablet by mouth twice a day  *If you need a refill on your cardiac medications before your next appointment, please call your pharmacy*  Lab Work:  None ordered today  Testing/Procedures:  Your physician has requested that you have an echocardiogram. Echocardiography is a painless test that uses sound waves to create images of your heart. It provides your doctor with information about the size and shape of your heart and how well your heart's chambers and valves are working. This procedure takes approximately one hour. There are no restrictions for this procedure.   Follow-Up: At [facility], you and your health needs are our priority.  As part of our continuing mission to provide you with exceptional heart care, we have created designated Provider Care Teams.  These Care Teams include your primary Cardiologist (physician) and Advanced Practice Providers (APPs -  Physician Assistants and Nurse Practitioners) who all work together to provide you with the care you need, when you need it.  Your next appointment:   12 month(s)  The format for your next appointment:   Either In Person or Virtual  Provider:   You may see Sherren Mocha, MD or one of the following Advanced Practice Providers on your designated Care Team:    Richardson Dopp, PA-C  [place], [place]  Daune Perch, [place]

## 2019-09-21 DIAGNOSIS — R42 Dizziness and giddiness: Secondary | ICD-10-CM | POA: Diagnosis not present

## 2019-09-21 DIAGNOSIS — H832X2 Labyrinthine dysfunction, left ear: Secondary | ICD-10-CM | POA: Diagnosis not present

## 2019-10-04 ENCOUNTER — Other Ambulatory Visit: Payer: Self-pay | Admitting: *Deleted

## 2019-10-04 DIAGNOSIS — H832X2 Labyrinthine dysfunction, left ear: Secondary | ICD-10-CM | POA: Diagnosis not present

## 2019-10-04 DIAGNOSIS — R42 Dizziness and giddiness: Secondary | ICD-10-CM | POA: Diagnosis not present

## 2019-10-04 NOTE — Telephone Encounter (Signed)
Prescription refill request for Xarelto received from [place] [facility]. Per office visit with Richardson Dopp, pt no longer taking Xarelto and is now taking Eliquis. Called and spoke to pharmacy and made them aware pt no longer taking Xarelto.

## 2019-10-11 DIAGNOSIS — Z23 Encounter for immunization: Secondary | ICD-10-CM | POA: Diagnosis not present

## 2019-10-12 DIAGNOSIS — Z981 Arthrodesis status: Secondary | ICD-10-CM | POA: Diagnosis not present

## 2019-10-12 DIAGNOSIS — M545 Low back pain: Secondary | ICD-10-CM | POA: Diagnosis not present

## 2019-10-12 DIAGNOSIS — R29898 Other symptoms and signs involving the musculoskeletal system: Secondary | ICD-10-CM | POA: Diagnosis not present

## 2019-10-14 ENCOUNTER — Other Ambulatory Visit: Payer: Self-pay | Admitting: Physician Assistant

## 2019-10-18 DIAGNOSIS — H832X2 Labyrinthine dysfunction, left ear: Secondary | ICD-10-CM | POA: Diagnosis not present

## 2019-10-18 DIAGNOSIS — R42 Dizziness and giddiness: Secondary | ICD-10-CM | POA: Diagnosis not present

## 2019-10-20 DIAGNOSIS — M438X6 Other specified deforming dorsopathies, lumbar region: Secondary | ICD-10-CM | POA: Diagnosis not present

## 2019-10-20 DIAGNOSIS — Z981 Arthrodesis status: Secondary | ICD-10-CM | POA: Diagnosis not present

## 2019-10-20 DIAGNOSIS — M5187 Other intervertebral disc disorders, lumbosacral region: Secondary | ICD-10-CM | POA: Diagnosis not present

## 2019-10-20 DIAGNOSIS — M47816 Spondylosis without myelopathy or radiculopathy, lumbar region: Secondary | ICD-10-CM | POA: Diagnosis not present

## 2019-10-20 DIAGNOSIS — M5136 Other intervertebral disc degeneration, lumbar region: Secondary | ICD-10-CM | POA: Diagnosis not present

## 2019-10-20 DIAGNOSIS — M79604 Pain in right leg: Secondary | ICD-10-CM | POA: Diagnosis not present

## 2019-10-20 DIAGNOSIS — M4316 Spondylolisthesis, lumbar region: Secondary | ICD-10-CM | POA: Diagnosis not present

## 2019-10-20 DIAGNOSIS — M545 Low back pain: Secondary | ICD-10-CM | POA: Diagnosis not present

## 2019-10-20 DIAGNOSIS — Z79899 Other long term (current) drug therapy: Secondary | ICD-10-CM | POA: Diagnosis not present

## 2019-10-20 DIAGNOSIS — M48061 Spinal stenosis, lumbar region without neurogenic claudication: Secondary | ICD-10-CM | POA: Diagnosis not present

## 2019-10-20 DIAGNOSIS — M79605 Pain in left leg: Secondary | ICD-10-CM | POA: Diagnosis not present

## 2019-10-20 DIAGNOSIS — M4807 Spinal stenosis, lumbosacral region: Secondary | ICD-10-CM | POA: Diagnosis not present

## 2019-10-22 MED ORDER — RIVAROXABAN 20 MG PO TABS: 20.0000 mg | ORAL_TABLET | Freq: Every day | ORAL | 3 refills | Status: DC

## 2019-10-23 DIAGNOSIS — Z03818 Encounter for observation for suspected exposure to other biological agents ruled out: Secondary | ICD-10-CM | POA: Diagnosis not present

## 2019-10-23 DIAGNOSIS — Z20828 Contact with and (suspected) exposure to other viral communicable diseases: Secondary | ICD-10-CM | POA: Diagnosis not present

## 2019-11-11 DIAGNOSIS — H838X2 Other specified diseases of left inner ear: Secondary | ICD-10-CM | POA: Diagnosis not present

## 2019-11-11 DIAGNOSIS — H9042 Sensorineural hearing loss, unilateral, left ear, with unrestricted hearing on the contralateral side: Secondary | ICD-10-CM | POA: Diagnosis not present

## 2019-11-11 DIAGNOSIS — H8102 Meniere's disease, left ear: Secondary | ICD-10-CM | POA: Diagnosis not present

## 2019-11-11 DIAGNOSIS — R42 Dizziness and giddiness: Secondary | ICD-10-CM | POA: Diagnosis not present

## 2019-12-01 DIAGNOSIS — E785 Hyperlipidemia, unspecified: Secondary | ICD-10-CM | POA: Diagnosis not present

## 2019-12-06 DIAGNOSIS — J343 Hypertrophy of nasal turbinates: Secondary | ICD-10-CM | POA: Diagnosis not present

## 2019-12-06 DIAGNOSIS — R42 Dizziness and giddiness: Secondary | ICD-10-CM | POA: Diagnosis not present

## 2019-12-06 DIAGNOSIS — H8102 Meniere's disease, left ear: Secondary | ICD-10-CM | POA: Diagnosis not present

## 2019-12-06 DIAGNOSIS — H838X3 Other specified diseases of inner ear, bilateral: Secondary | ICD-10-CM | POA: Diagnosis not present

## 2019-12-06 DIAGNOSIS — H903 Sensorineural hearing loss, bilateral: Secondary | ICD-10-CM | POA: Diagnosis not present

## 2019-12-06 DIAGNOSIS — J310 Chronic rhinitis: Secondary | ICD-10-CM | POA: Diagnosis not present

## 2019-12-07 ENCOUNTER — Other Ambulatory Visit (HOSPITAL_COMMUNITY): Payer: Self-pay | Admitting: Otolaryngology

## 2019-12-07 ENCOUNTER — Other Ambulatory Visit: Payer: Self-pay | Admitting: Otolaryngology

## 2019-12-07 DIAGNOSIS — H905 Unspecified sensorineural hearing loss: Secondary | ICD-10-CM

## 2019-12-07 DIAGNOSIS — H903 Sensorineural hearing loss, bilateral: Secondary | ICD-10-CM

## 2019-12-07 DIAGNOSIS — H6982 Other specified disorders of Eustachian tube, left ear: Secondary | ICD-10-CM | POA: Diagnosis not present

## 2019-12-07 DIAGNOSIS — H6522 Chronic serous otitis media, left ear: Secondary | ICD-10-CM | POA: Diagnosis not present

## 2019-12-13 DIAGNOSIS — I350 Nonrheumatic aortic (valve) stenosis: Secondary | ICD-10-CM | POA: Diagnosis not present

## 2019-12-13 DIAGNOSIS — I10 Essential (primary) hypertension: Secondary | ICD-10-CM | POA: Diagnosis not present

## 2019-12-13 DIAGNOSIS — R739 Hyperglycemia, unspecified: Secondary | ICD-10-CM | POA: Diagnosis not present

## 2019-12-13 DIAGNOSIS — I4811 Longstanding persistent atrial fibrillation: Secondary | ICD-10-CM | POA: Diagnosis not present

## 2019-12-13 DIAGNOSIS — E785 Hyperlipidemia, unspecified: Secondary | ICD-10-CM | POA: Diagnosis not present

## 2019-12-13 DIAGNOSIS — D649 Anemia, unspecified: Secondary | ICD-10-CM | POA: Diagnosis not present

## 2019-12-13 DIAGNOSIS — M169 Osteoarthritis of hip, unspecified: Secondary | ICD-10-CM | POA: Diagnosis not present

## 2019-12-20 ENCOUNTER — Other Ambulatory Visit: Payer: Self-pay

## 2019-12-20 ENCOUNTER — Ambulatory Visit (HOSPITAL_COMMUNITY)
Admission: RE | Admit: 2019-12-20 | Discharge: 2019-12-20 | Disposition: A | Discharge status: Home / Self Care | Payer: Medicare Other | Source: Ambulatory Visit | Attending: Otolaryngology | Admitting: Otolaryngology

## 2019-12-20 DIAGNOSIS — H903 Sensorineural hearing loss, bilateral: Secondary | ICD-10-CM

## 2019-12-20 DIAGNOSIS — H9193 Unspecified hearing loss, bilateral: Secondary | ICD-10-CM | POA: Diagnosis not present

## 2019-12-20 DIAGNOSIS — H905 Unspecified sensorineural hearing loss: Secondary | ICD-10-CM | POA: Insufficient documentation

## 2019-12-20 IMAGING — MR MR HEAD WO/W CM
13 series · 48 of 48 positions shown · IV contrast (gadavist)
Comparison: None.

CLINICAL DATA: Bilateral hearing loss.

EXAM:
MRI HEAD WITHOUT AND WITH CONTRAST
TECHNIQUE: Multiplanar, multiecho pulse sequences of the brain and surrounding
structures were obtained without and with intravenous contrast.
CONTRAST:  10mL GADAVIST GADOBUTROL 1 MMOL/ML IV SOLN

[Series 5: T1 · sagittal · 5.0mm · 0.75mm/px · 2 of 24 slices shown (1 of 3)]
[im 1/24]
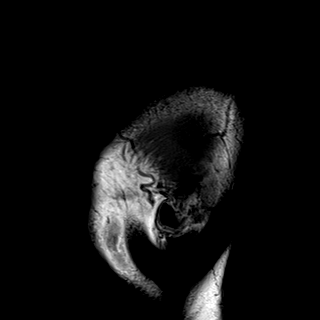
[im 24/24]
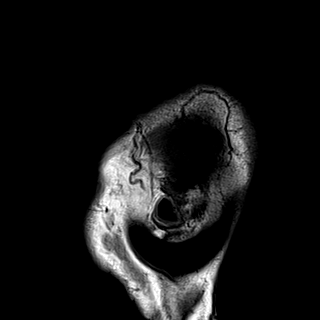

[Series 6: DWI · axial · 3.0mm · 1.36mm/px · z∈[-15,+130]mm · 8 of 100 slices shown (1 of 2)]
[im 1/100]
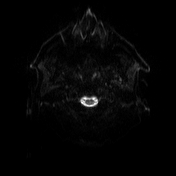
[im 15/100]
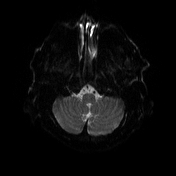
[im 29/100]
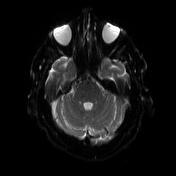
[im 43/100]
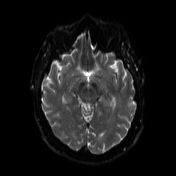
[im 57/100]
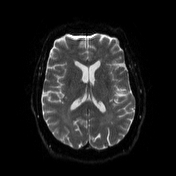
[im 71/100]
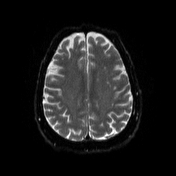
[im 85/100]
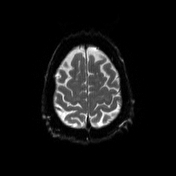
[im 100/100]
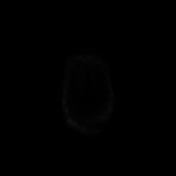

[Series 7: DWI · axial · 3.0mm · 1.36mm/px · z∈[-15,+130]mm · 4 of 50 slices shown (2 of 2)]
[im 1/50]
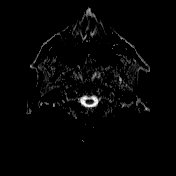
[im 17/50]
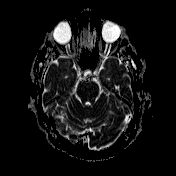
[im 33/50]
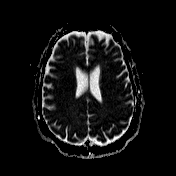
[im 50/50]
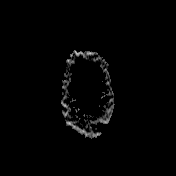

[Series 8: T2 · axial · 5.0mm · 0.62mm/px · z∈[-19,+135]mm · 2 of 25 slices shown]
[im 1/25]
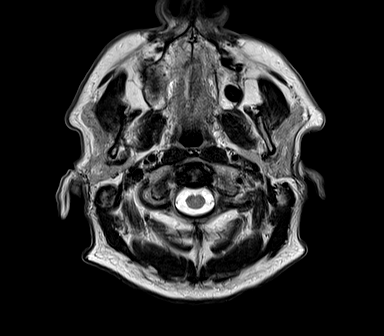
[im 25/25]
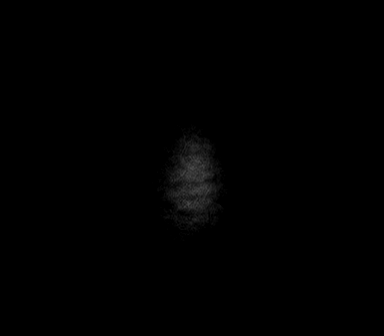

[Series 9: FLAIR · axial · 5.0mm · 0.75mm/px · z∈[-19,+135]mm · 2 of 25 slices shown]
[im 1/25]
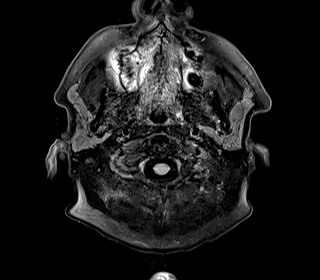
[im 25/25]
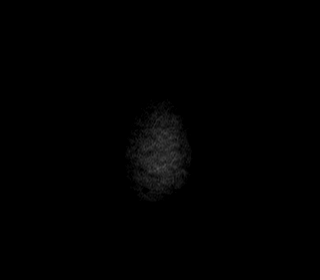

[Series 10: mip_images(sw) · axial · 24.0mm · 0.75mm/px · z∈[-5,+126]mm · 4 of 45 slices shown]
[im 1/45]
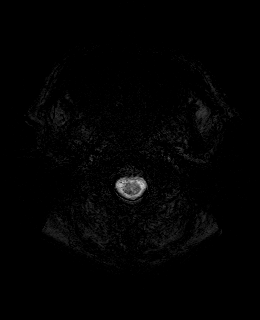
[im 15/45]
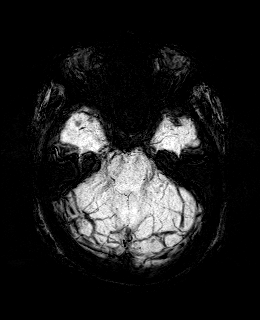
[im 30/45]
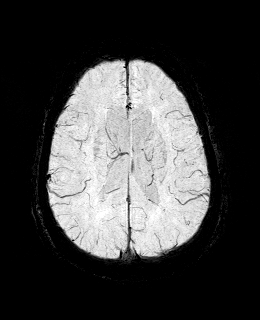
[im 45/45]
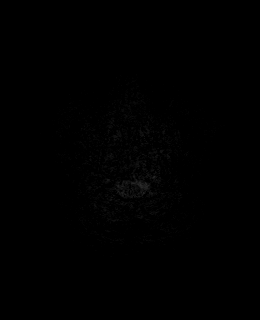

[Series 11: swi_images · axial · 3.0mm · 0.75mm/px · z∈[-15,+136]mm · 4 of 52 slices shown]
[im 1/52]
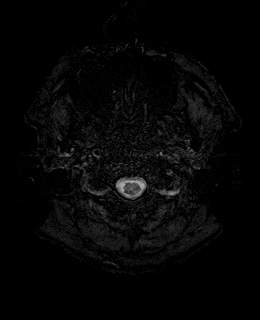
[im 18/52]
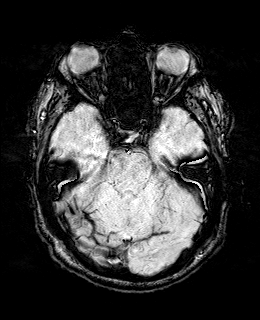
[im 35/52]
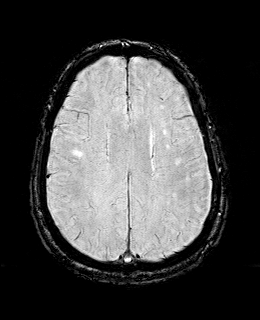
[im 52/52]
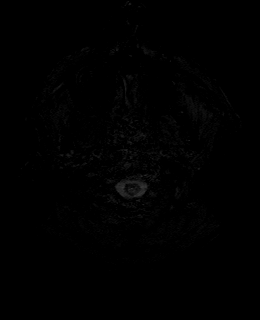

[Series 13: T1 · axial · 3.0mm · 0.31mm/px · 1 of 15 slices shown (2 of 3)]
[im 1/15]
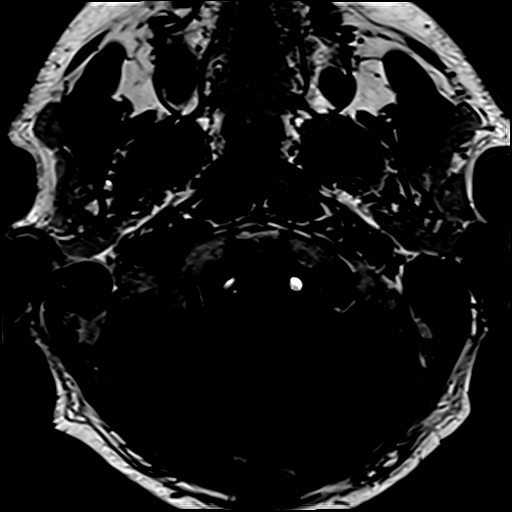

[Series 14: T1 · coronal · 3.0mm · 0.50mm/px · 1 of 15 slices shown (3 of 3)]
[im 1/15]
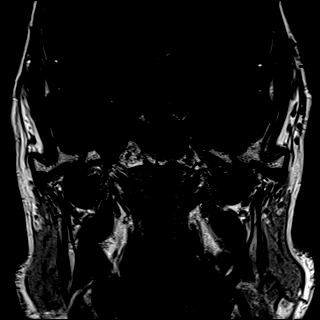

[Series 15: bSSFP · axial · 0.6mm · 0.56mm/px · z∈[-16,+10]mm · 4 of 44 slices shown]
[im 1/44]
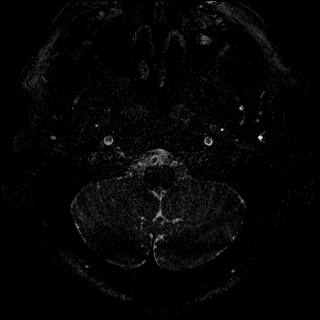
[im 15/44]
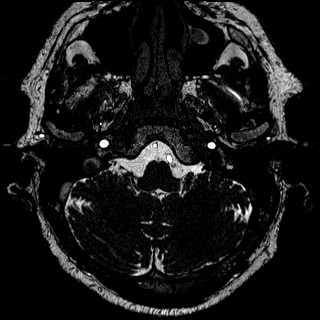
[im 29/44]
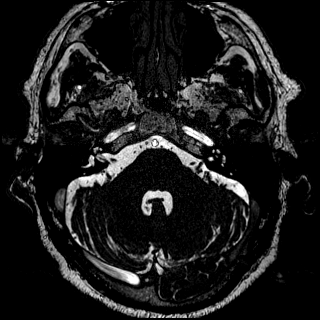
[im 44/44]
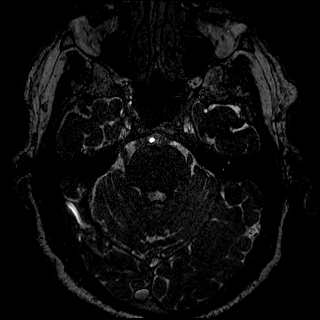

[Series 16: T1 post-contrast · coronal · 3.0mm · 0.50mm/px · 1 of 15 slices shown (1 of 3)]
[im 1/15]
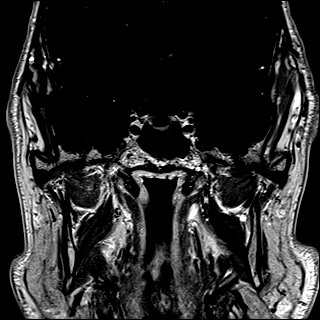

[Series 17: T1 post-contrast · axial · 3.0mm · 0.31mm/px · 1 of 15 slices shown (2 of 3)]
[im 1/15]
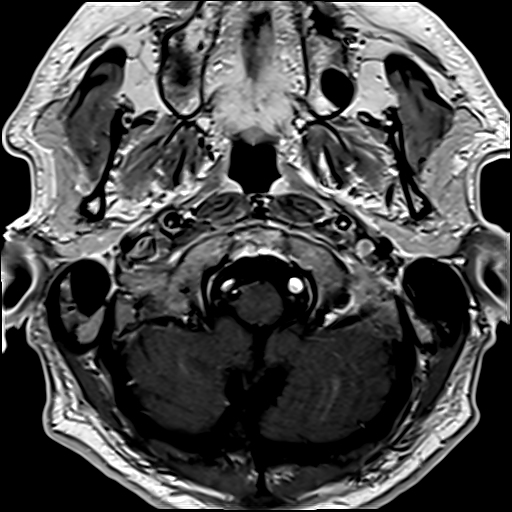

[Series 18: T1 post-contrast · axial · 1.0mm · 0.94mm/px · z∈[-28,+145]mm · 14 of 176 slices shown (3 of 3)]
[im 1/176]
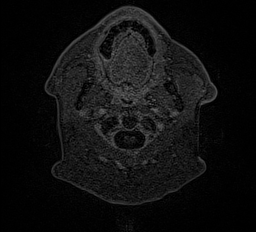
[im 14/176]
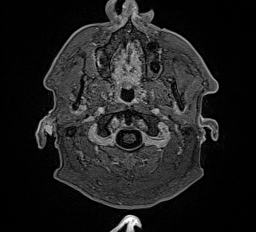
[im 27/176]
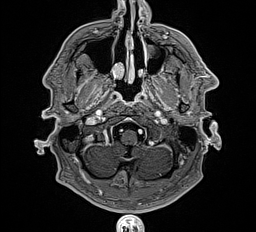
[im 41/176]
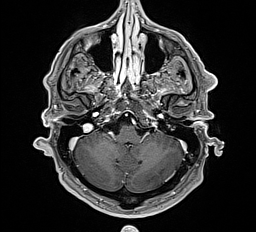
[im 54/176]
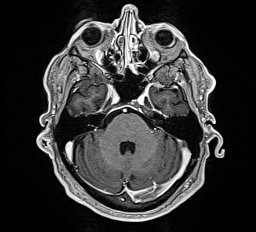
[im 68/176]
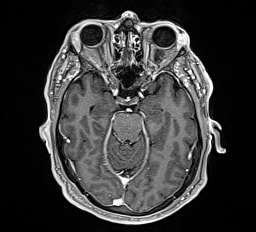
[im 81/176]
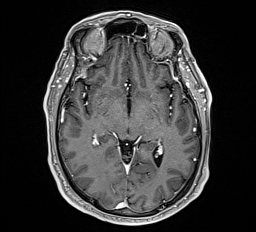
[im 95/176]
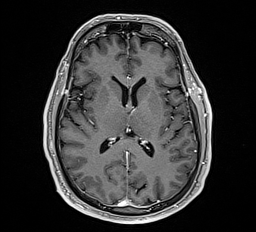
[im 108/176]
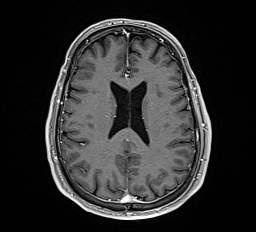
[im 122/176]
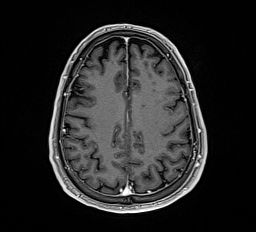
[im 135/176]
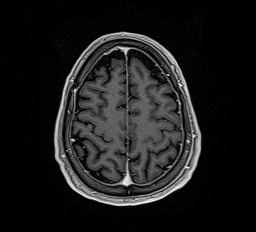
[im 149/176]
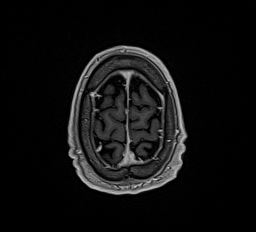
[im 162/176]
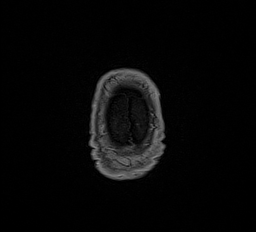
[im 176/176]
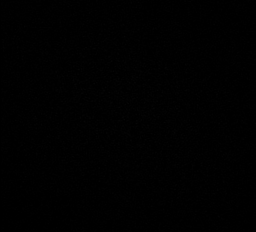

[48 of 48 positions shown; findings below may reference images not displayed]

FINDINGS: BRAIN: No acute infarct, acute hemorrhage or extra-axial collection.
Multifocal white matter hyperintensity, most commonly due to chronic
ischemic microangiopathy. Normal volume of brain parenchyma and CSF
spaces. Midline structures are normal. There is no cerebellopontine
angle mass. The cochleae and semicircular canals are normal. No
focal abnormality along the course of the 7th and 8th cranial
nerves. Normal porus acusticus and vestibular aqueduct bilaterally.
No abnormal contrast enhancement.

VASCULAR: Major flow voids are preserved. Susceptibility-sensitive
sequences show no chronic microhemorrhage or superficial siderosis.

SKULL AND UPPER CERVICAL SPINE: Normal calvarium and skull base.
Visualized upper cervical spine and soft tissues are normal.

SINUSES/ORBITS: No paranasal sinus fluid levels or advanced mucosal
thickening. No mastoid or middle ear effusion. Normal orbits.
IMPRESSION: 1. Normal internal auditory canals and labyrinthine structures.
2. No acute intracranial abnormality.
3. Findings of mild chronic small vessel disease.

## 2019-12-20 MED ORDER — GADOBUTROL 1 MMOL/ML IV SOLN
10.0000 mL | Freq: Once | INTRAVENOUS | Status: AC | PRN
  Administered 2019-12-20: 10 mL via INTRAVENOUS

## 2020-01-11 DIAGNOSIS — H838X2 Other specified diseases of left inner ear: Secondary | ICD-10-CM | POA: Diagnosis not present

## 2020-01-11 DIAGNOSIS — H8102 Meniere's disease, left ear: Secondary | ICD-10-CM | POA: Diagnosis not present

## 2020-01-11 DIAGNOSIS — R42 Dizziness and giddiness: Secondary | ICD-10-CM | POA: Diagnosis not present

## 2020-01-11 DIAGNOSIS — H7202 Central perforation of tympanic membrane, left ear: Secondary | ICD-10-CM | POA: Diagnosis not present

## 2020-01-11 DIAGNOSIS — J310 Chronic rhinitis: Secondary | ICD-10-CM | POA: Diagnosis not present

## 2020-01-18 DIAGNOSIS — M5416 Radiculopathy, lumbar region: Secondary | ICD-10-CM | POA: Diagnosis not present

## 2020-01-18 DIAGNOSIS — M48061 Spinal stenosis, lumbar region without neurogenic claudication: Secondary | ICD-10-CM | POA: Diagnosis not present

## 2020-01-18 DIAGNOSIS — Z981 Arthrodesis status: Secondary | ICD-10-CM | POA: Diagnosis not present

## 2020-01-25 DIAGNOSIS — M5416 Radiculopathy, lumbar region: Secondary | ICD-10-CM | POA: Diagnosis not present

## 2020-02-08 DIAGNOSIS — M5416 Radiculopathy, lumbar region: Secondary | ICD-10-CM | POA: Diagnosis not present

## 2020-03-17 DIAGNOSIS — M48061 Spinal stenosis, lumbar region without neurogenic claudication: Secondary | ICD-10-CM | POA: Diagnosis not present

## 2020-03-17 DIAGNOSIS — M961 Postlaminectomy syndrome, not elsewhere classified: Secondary | ICD-10-CM | POA: Diagnosis not present

## 2020-03-17 DIAGNOSIS — Z96641 Presence of right artificial hip joint: Secondary | ICD-10-CM | POA: Diagnosis not present

## 2020-03-17 DIAGNOSIS — M4326 Fusion of spine, lumbar region: Secondary | ICD-10-CM | POA: Diagnosis not present

## 2020-03-17 DIAGNOSIS — M5136 Other intervertebral disc degeneration, lumbar region: Secondary | ICD-10-CM | POA: Diagnosis not present

## 2020-03-17 DIAGNOSIS — M4726 Other spondylosis with radiculopathy, lumbar region: Secondary | ICD-10-CM | POA: Diagnosis not present

## 2020-03-20 DIAGNOSIS — Z96641 Presence of right artificial hip joint: Secondary | ICD-10-CM | POA: Diagnosis not present

## 2020-03-20 DIAGNOSIS — M5136 Other intervertebral disc degeneration, lumbar region: Secondary | ICD-10-CM | POA: Diagnosis not present

## 2020-03-20 DIAGNOSIS — Z471 Aftercare following joint replacement surgery: Secondary | ICD-10-CM | POA: Diagnosis not present

## 2020-04-04 DIAGNOSIS — R1031 Right lower quadrant pain: Secondary | ICD-10-CM | POA: Diagnosis not present

## 2020-04-04 DIAGNOSIS — M79651 Pain in right thigh: Secondary | ICD-10-CM | POA: Diagnosis not present

## 2020-04-11 ENCOUNTER — Ambulatory Visit (INDEPENDENT_AMBULATORY_CARE_PROVIDER_SITE_OTHER): Payer: Medicare Other | Admitting: Sports Medicine

## 2020-04-11 ENCOUNTER — Other Ambulatory Visit: Payer: Self-pay

## 2020-04-11 VITALS — BP 104/66 | Ht 68.5 in | Wt 162.0 lb

## 2020-04-11 DIAGNOSIS — I2510 Atherosclerotic heart disease of native coronary artery without angina pectoris: Secondary | ICD-10-CM | POA: Diagnosis not present

## 2020-04-11 DIAGNOSIS — M25551 Pain in right hip: Secondary | ICD-10-CM

## 2020-04-11 NOTE — Patient Instructions (Addendum)
We are going to get a bone scan of your right hip to check for hardware loosening. We will also refer you to Dr. Thedore Mins for your right hip.  [place] [facility] [place] [phone]  Appt: Wednesday 04/12/20 @ 3:15 pm. Please arrive at 3 pm.

## 2020-04-11 NOTE — Assessment & Plan Note (Signed)
History and exam suspicious for intra-articular pathology of right hip but also with symptoms concerning for involvement of nerve origin -Right hip bone scan to assess for hardware instability -Refer to PM&R Dr. Jetty Duhamel for further ultrasound evaluation of nerve involvement to affected area

## 2020-04-11 NOTE — Progress Notes (Signed)
PCP: Cody Gravel, MD  Subjective:   CC: Patient is a 74 y.o. male here for right groin pain.  HPI: Patient has a very complex medical history in his right hip/low back area. 09/2018: Right hip replacement 02/2019: L4/L5 fusion 09/2019: Recovered to baseline function and activity 01/2020: Acute onset right groin pain while playing tennis Received epidural steroid injections at L2/L3, L4/L5, L5/S1 with no relief 03/2020: Nerve conduction study, lumbar MRI, hip CT, steroid injection in anterior right groin by Dr. Alvan Dame which resulted in 80% improvement of pain which has lasted up until the week prior to this appointment. 04/11/2020: At today's appointment, patient describes right groin pain that is a burning sensation and is nonradiating.  Pain impacts his daily activities.  He also has associated right foot drop and sensation of his right leg giving out resulting in several falls.  Patient has discontinued his Xarelto due to fear of frequent falls.  Reviewed nerve conduction studies read on patient's phone which was inconclusive.  Also reviewed documentation provided by patient from other specialists.  Denies: Incontinence, impotence, abnormal sensation when wiping, night sweats, rash. Endorses: Intentional 16 pound weight loss over the last few months, abnormal sensation in right lateral thigh, right leg weakness, right foot drop  Past Medical History:  Diagnosis Date  . Anemia    SLIGHT ANEMIA 4 MONTHS AGO  . Aortic valve stenosis 12/19/2017   Echo 07/30/16 - EF 55-60, normal wall motion, normal diastolic function, mild aortic stenosis (mean 9, peak 16), mild LAE // Echo 4/19:  EF 60-65, no RWMA, Gr 2 DD, mild AS (mean 11, peak 24)   . Arthritis    OA  . Benign prostate hyperplasia    unspecified whether lower urinary tract sym. present   . Carotid atherosclerosis    Carotid US 4/19:  bilat ICA 1-39; vertebrals and subclavians normal  . Carotid atherosclerosis, bilateral 04/30/2018   1-39% on  12/2017 Care Everywhere Echo  . Coronary artery calcification seen on CT scan 06/05/2018  . Dysrhythmia    ATRIAL FIB  . Elevated prostate specific antigen (PSA) 08/30/2016  . Glucose intolerance    "pre-diabetic"  . Hayfever   . Heart murmur   . History of exercise stress test    GXT 4/19:  ETT with good exercise tolerance (10:00); no chest pain; normal BP response; no diagnostic ST changes; negative adequate ETT.  Marland Kitchen HOH (hard of hearing)    BOTH EARS  . Low back pain with sciatica 06/02/2012  . Lumbar herniated disc 01/30/2018  . Mild aortic stenosis 04/30/2018   12/2017 [facility] Everywhere Echo : Mean gradient (S): 11 mm Hg. Peak gradient (S): 24 mm Hg.  . Numbness of right thumb    FROM CERVICAL NECK SURGERY  . Osteoarthritis of right hip 09/21/2018  . Overweight (BMI 25.0-29.9) 09/30/2018  . Paroxysmal atrial fibrillation ([place]) 06/05/2018  . Prediabetes 04/30/2018  . Prostatitis    unspecified prostatitis type  . Pure hypercholesterolemia    Current Outpatient Medications on File Prior to Visit  Medication Sig Dispense Refill  . cetirizine (ZYRTEC) 10 MG tablet Take 10 mg by mouth every other day.    . diltiazem (CARDIZEM CD) 120 MG 24 hr capsule TAKE 1 CAPSULE(120 MG) BY MOUTH DAILY 90 capsule 2  . fexofenadine (ALLEGRA ALLERGY) 180 MG tablet Take 180 mg by mouth every other day.    . fluticasone (FLONASE) 50 MCG/ACT nasal spray Place 1 spray into both nostrils 2 (two) times daily.     Marland Kitchen  Multiple Vitamins-Minerals (MULTIVITAMIN ADULT PO) Take 1 tablet by mouth daily.    . rivaroxaban (XARELTO) 20 MG TABS tablet Take 1 tablet (20 mg total) by mouth daily with supper. 90 tablet 3  . simvastatin (ZOCOR) 40 MG tablet Take 40 mg by mouth daily.    . TURMERIC PO Take 1 tablet by mouth daily.     No current facility-administered medications on file prior to visit.   Past Surgical History:  Procedure Laterality Date  . CERVICAL SPINE SURGERY  2011  . COLONOSCOPY    . HIP SURGERY Right     arthroscopy  . LUMBAR DISC SURGERY  2014   L2-3, L3 TO L4 X 2 2019 AT [facility]  . SHOULDER ARTHROSCOPY Right   . TOTAL HIP ARTHROPLASTY Right 09/29/2018   Procedure: TOTAL HIP ARTHROPLASTY ANTERIOR APPROACH;  Surgeon: Paralee Cancel, MD;  Location: [facility];  Service: Orthopedics;  Laterality: Right;  71   No Known Allergies Social History   Socioeconomic History  . Marital status: Married    Spouse name: Not on file  . Number of children: 2  . Years of education: Not on file  . Highest education level: Not on file  Occupational History  . 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 Occupation: Long Term Care Facilities    Comment: Owns several  Tobacco Use  . Smoking status: Former Smoker    Packs/day: 2.00    Years: 9.00    Pack years: 18.00  . Smokeless tobacco: Never Used  . Tobacco comment: QUIT 1081  Vaping Use  . Vaping Use: Never used  Substance and Sexual Activity  . Alcohol use: Never  . Drug use: Never  . Sexual activity: Not on file  Other Topics Concern  . 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 Difficulty of Paying Living Expenses:   Food Insecurity:   . Worried About Charity fundraiser in the Last Year:   . Arboriculturist in the Last Year:   Transportation Needs:   . Film/video editor (Medical):   Marland Kitchen Lack of Transportation (Non-Medical):   Physical Activity:   . Days of Exercise per Week:   . Minutes of Exercise per Session:   Stress:   . Feeling of Stress :   Social Connections:   . Frequency of Communication with Friends and Family:   . Frequency of Social Gatherings with Friends and Family:   . Attends Religious Services:   . Active Member of Clubs or Organizations:   . Attends Archivist Meetings:   Marland Kitchen Marital Status:   Intimate Partner Violence:   . Fear  of Current or Ex-Partner:   . Emotionally Abused:   Marland Kitchen Physically Abused:   . Sexually Abused:    Family History  Problem Relation Age of Onset  . Dementia Mother   . Congestive Heart Failure Father   . CAD Father        quad bypass at age 47  . CAD Brother 64    ROS: See HPI above. I have personally reviewed pertinent past medical history, surgical, family, and social history as appropriate.      Objective:  BP 104/66   Ht 5' 8.5" (1.74 m)   Wt 162 lb (73.5  kg)   BMI 24.27 kg/m   Physical Exam: Gen: NAD, comfortable in exam room R hip Observation: Positive for healed surgical scar, negative erythema, deformity.  Right leg mild diffuse muscular atrophy when compared to left Palpation: Positive pain with palpation of right groin.  Negative herniation with Valsalva ROM: Full range Strength: Decreased strength with hip abduction and worse in flexion Sensation: Sensation abnormal in right lateral thigh. Reflexes: Normal right patellar and Achilles.  Exaggerated left patellar reflex. Special tests: Positive Trendelenburg on the right.  Positive FABER, FADIR, log roll  Lumbar spine Observation: Positive for healed surgical scars Palpation: Negative tenderness to palpation of spine from cervical to sacrum as well as no tenderness to palpation of SI, PSIS ROM: Decreased flexion   Assessment & Plan:  Right hip pain History and exam suspicious for intra-articular pathology of right hip but also with symptoms concerning for involvement of nerve origin -Right hip bone scan to assess for hardware instability -Refer to PM&R Dr. Jetty Duhamel for further ultrasound evaluation of nerve involvement to affected area   I independently examined pertinent imaging in relation to cc.  Doristine Mango, DO [facility] PGY-3  Patient seen and evaluated with the resident.  I agree with the above plan of care.  This is a very complicated case.  I would like to elicit the input of  Dr. Thedore Mins.  He may be able to ultrasound the right inguinal area to see if there is a nerve entrapment that may be amendable to future injections.  Of note, his subjective complaint of a new onset right foot drop is concerning for lumbar radiculopathy for which he has had an extensive work-up in the past.  I would like to get a triple phase bone scan of the right hip just to rule out hardware loosening and I will call him when those results are available.

## 2020-04-12 DIAGNOSIS — M25551 Pain in right hip: Secondary | ICD-10-CM | POA: Diagnosis not present

## 2020-04-18 ENCOUNTER — Encounter (HOSPITAL_COMMUNITY)
Admission: RE | Admit: 2020-04-18 | Discharge: 2020-04-18 | Disposition: A | Discharge status: Home / Self Care | Payer: Medicare Other | Source: Ambulatory Visit | Attending: Sports Medicine | Admitting: Sports Medicine

## 2020-04-18 ENCOUNTER — Other Ambulatory Visit: Payer: Self-pay

## 2020-04-18 DIAGNOSIS — M25551 Pain in right hip: Secondary | ICD-10-CM | POA: Diagnosis not present

## 2020-04-18 IMAGING — NM NM BONE 3 PHASE
8 series · 8 of 8 positions shown · non-contrast
Comparison: None.

CLINICAL DATA: Right hip pain.

EXAM:
NUCLEAR MEDICINE 3-PHASE BONE SCAN
TECHNIQUE: Radionuclide angiographic images, immediate static blood pool
images, and 3-hour delayed static images were obtained of the pelvis
after intravenous injection of radiopharmaceutical.
RADIOPHARMACEUTICALS:  20.3 mCi [II] MDP IV

[Series 2: delay · delayed · 2.07mm/px · 1 of 1 slices shown (1 of 4)]
[im 1/1]
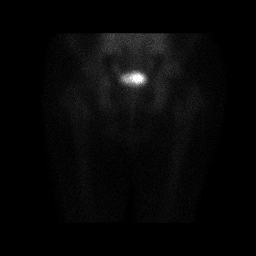

[Series 2: blood pool · 2.07mm/px · 1 of 1 slices shown (1 of 2)]
[im 1/1]
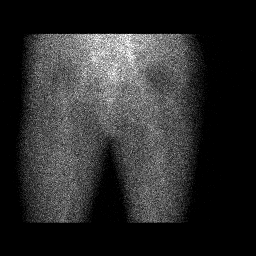

[Series 2: delay · delayed · 2.07mm/px · 1 of 1 slices shown (2 of 4)]
[im 1/1]
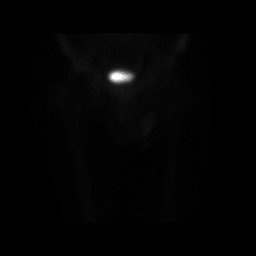

[Series 2: blood pool · 2.07mm/px · 1 of 1 slices shown (2 of 2)]
[im 1/1]
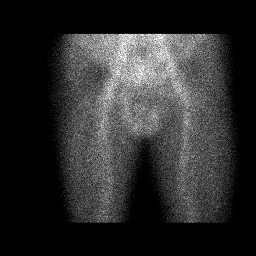

[Series 3: lat bp · 2.07mm/px · 1 of 1 slices shown (1 of 2)]
[im 1/1]
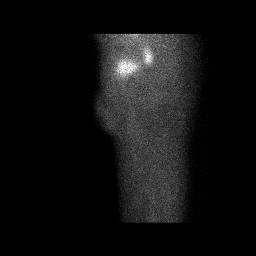

[Series 3: lat bp · 2.07mm/px · 1 of 1 slices shown (2 of 2)]
[im 1/1]
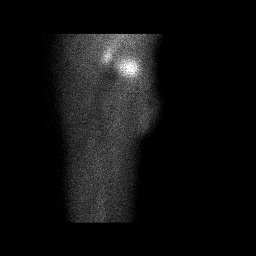

[Series 3: delay · delayed · 2.07mm/px · 1 of 1 slices shown (3 of 4)]
[im 1/1]
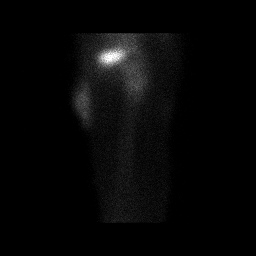

[Series 3: delay · delayed · 2.07mm/px · 1 of 1 slices shown (4 of 4)]
[im 1/1]
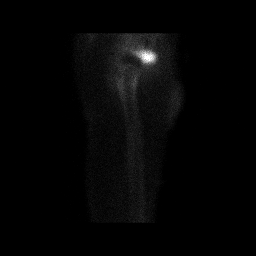

[8 of 8 positions shown; findings below may reference images not displayed]

FINDINGS: Vascular phase: There is no abnormal asymmetric increased
radiotracer uptake.

Blood pool phase: There is no abnormal asymmetric increased
radiotracer uptake.

Delayed phase: Mild increased tracer uptake localizing to the right
femur surrounding the femoral component of the right hip
arthroplasty device noted.
IMPRESSION: 1. No specific findings identified to suggest arthroplasty device
loosening or infection.

## 2020-04-18 MED ORDER — TECHNETIUM TC 99M MEDRONATE IV KIT
20.0000 | PACK | Freq: Once | INTRAVENOUS | Status: AC | PRN
  Administered 2020-04-18: 20 via INTRAVENOUS

## 2020-04-19 DIAGNOSIS — M25551 Pain in right hip: Secondary | ICD-10-CM | POA: Diagnosis not present

## 2020-04-24 ENCOUNTER — Telehealth: Payer: Self-pay

## 2020-04-24 NOTE — Telephone Encounter (Signed)
Pt states that his appt with Dr. Thedore Mins went really well and he thanked [place] for the referral.

## 2020-05-07 DIAGNOSIS — I480 Paroxysmal atrial fibrillation: Secondary | ICD-10-CM

## 2020-05-10 DIAGNOSIS — M545 Low back pain: Secondary | ICD-10-CM | POA: Diagnosis not present

## 2020-05-11 ENCOUNTER — Telehealth: Payer: Self-pay | Admitting: Radiology

## 2020-05-11 NOTE — Telephone Encounter (Signed)
Per Dr. Burt Knack, event monitor ordered for mailing.

## 2020-05-11 NOTE — Telephone Encounter (Signed)
Enrolled patient for a 30 day [employer] Event Monitor to be mailed to patients home  

## 2020-05-16 DIAGNOSIS — M5416 Radiculopathy, lumbar region: Secondary | ICD-10-CM | POA: Diagnosis not present

## 2020-05-22 DIAGNOSIS — M5416 Radiculopathy, lumbar region: Secondary | ICD-10-CM | POA: Diagnosis not present

## 2020-05-23 DIAGNOSIS — M5416 Radiculopathy, lumbar region: Secondary | ICD-10-CM | POA: Diagnosis not present

## 2020-05-23 DIAGNOSIS — R030 Elevated blood-pressure reading, without diagnosis of hypertension: Secondary | ICD-10-CM | POA: Diagnosis not present

## 2020-05-24 ENCOUNTER — Other Ambulatory Visit: Payer: Self-pay | Admitting: Neurosurgery

## 2020-05-26 ENCOUNTER — Ambulatory Visit (INDEPENDENT_AMBULATORY_CARE_PROVIDER_SITE_OTHER): Payer: Medicare Other

## 2020-05-26 ENCOUNTER — Encounter: Payer: Self-pay | Admitting: Cardiovascular Disease

## 2020-05-26 DIAGNOSIS — I480 Paroxysmal atrial fibrillation: Secondary | ICD-10-CM | POA: Diagnosis not present

## 2020-06-01 DIAGNOSIS — M5416 Radiculopathy, lumbar region: Secondary | ICD-10-CM | POA: Diagnosis not present

## 2020-06-06 ENCOUNTER — Telehealth: Payer: Self-pay | Admitting: Nurse Practitioner

## 2020-06-06 NOTE — Telephone Encounter (Signed)
Monitor strips show what appears to be lead loss and artifact.

## 2020-06-06 NOTE — Telephone Encounter (Signed)
Received faxed report from [facility] with documentation of questionable syncope on 9/27 8:35 EST. Report states Dr. Burt Knack was paged.  I called and left a message for patient to call our office to report any symptoms he had last night.

## 2020-06-12 ENCOUNTER — Telehealth: Payer: Self-pay

## 2020-06-12 NOTE — Telephone Encounter (Signed)
Received monitor report by fax from [employer]. Report states - SVT (>60 Sec) on 06/11/20 at 3:33 PM CT. Called patient, no answer, left message for patient to call back.

## 2020-06-13 NOTE — Telephone Encounter (Signed)
Spoke with the patient regarding monitor report that was received. Patient reports that he was not symptomatic at the time of the event and has not had any symptoms while wearing the monitor. He is not sure what he was doing at the time of the event.  Report reviewed by DOD, Dr. Quentin Ore. No changes at this time, patient to remain on xarelto and continue to wear monitor.

## 2020-06-13 NOTE — H&P (Signed)
Patient ID:   [id number] Patient: Cody Mcconnell  Date of Birth: 03-16-1946 Visit Type: Office Visit   Date: 05/23/2020 01:00 PM Provider: Marchia Meiers. Vertell Limber MD   This 74 year old male presents for back pain.  HISTORY OF PRESENT ILLNESS: 1.  back pain  Cody Mcconnell is a 74 year old male who is referred for neurosurgical evaluation by his primary care provider Dr. Maudie Mercury for evaluation of right groin pain and right lateral thigh pain and numbness.  He reports that his symptoms began around May of 2021 while playing tennis.  Since symptom onset he reports that he has been severely restricted in his daily activities.  He reports the pain in his groin is focal to the right inguinal area.  The right lateral thigh pain was reported to be a severe intermittent burning pain that he described as a "grabbing/charley horse type of pain."  The pain is reported to currently be a 4 to 5/10.  He does report occasional right lower extremity weakness that comes and goes every few weeks.  He further reports that he is unable to cross his legs, has been following up stairs, and having difficulty with raising his leg.  He has had multiple epidural steroid injections over the last 4-6 months targeting L2-L3 down to L5-S1.  He reports relief of his symptoms for no more than 36 hours.  In July of 2021 he received a steroid injection by Dr. Alvan Dame at the site of his pain in his right groin.  He reported significant relief for about 5 weeks.  Medications:  Tylenol 650 mg b.i.d.  Past medical history:  Atrial fibrillation (currently not taking Xarelto)   Past surgical history: L2-L3 laminectomy 2014 L3-L4 microdiskectomy June 2019 L3-L4 repeat laminectomy August 2019 Right hip replacement January 2020  L3-L4 PLIF June 2020       PAST MEDICAL/SURGICAL HISTORY:   (Reviewed, updated)   Disease/disorder Onset Date Management Date Comments   right shoulder surgery   Neuropathy        Family  History:  (Reviewed, updated)   Social History:  (Reviewed, updated) Tobacco use reviewed. Preferred language is [place].   Tobacco use status: Ex-cigarette smoker. Smoking status: Former smoker.  SMOKING STATUS Type Smoking Status Usage Per Day Years Used Total Pack Years  Former smoker         MEDICATIONS: (added, continued or stopped this visit) Started Medication Directions Instruction Stopped  Allegra 180 mg Tab take 1 tablet (180MG )  by oral route  every day    aspirin 81 mg Tab take 1 tablet (81MG )  by oral route  every day    Fish Oil 1,000 mg Cap UAD   01/27/2018 Nucynta 50 mg tablet take 1 tablet by oral route  every 6 hours as needed    simvastatin 10 mg Tab take 1 tablet (10MG )  by oral route  every day in the evening      ALLERGIES: Ingredient Reaction Medication Name Comment NKDA     Reviewed, no changes.    PHYSICAL EXAM:  Vitals Date Temp F BP Pulse Ht In Wt Lb BMI BSA Pain Score 05/23/2020  153/66 84 69 166.6 24.6  4/10   PHYSICAL EXAM Details General Level of Distress: no acute distress Overall Appearance: normal    Cardiovascular Cardiac: regular rate and rhythm without murmur  Right Left  Carotid Pulses: normal normal  Respiratory Lungs: clear to auscultation  Neurological Orientation: normal Recent and Remote Memory: normal Attention Span and Concentration:  normal Language: normal Fund of Knowledge: normal  Right Left Sensation: normal normal Upper Extremity Coordination: normal normal  Lower Extremity Coordination: normal normal  Musculoskeletal Gait and Station: normal  Right Left Upper Extremity Muscle Strength: normal normal Lower Extremity Muscle Strength: normal normal Upper Extremity Muscle Tone:  normal normal Lower Extremity Muscle Tone: normal normal   Motor Strength Upper and lower extremity motor strength was tested in the clinically pertinent  muscles. Any abnormal findings will be noted below.   Right Left Hip Flexor: 4/5    Deep Tendon Reflexes  Right Left Biceps: normal normal Triceps: normal normal Brachioradialis: normal normal Patellar: normal normal Achilles: normal normal  Sensory Sensation was tested at C2 to T1.   Cranial Nerves II. Optic Nerve/Visual Fields: normal III. Oculomotor: normal IV. Trochlear: normal V. Trigeminal: normal VI. Abducens: normal VII. Facial: normal VIII. Acoustic/Vestibular: normal IX. Glossopharyngeal: normal X. Vagus: normal XI. Spinal Accessory: normal XII. Hypoglossal: normal  Motor and other Tests Lhermittes: negative Rhomberg: negative Pronator drift: absent     Right Left Spurlings negative negative Hoffman's: normal normal Clonus: normal normal Babinski: normal normal SLR: negative negative Patrick's Corky Sox): negative negative Toe Walk: normal normal Toe Lift: normal normal Heel Walk: normal normal Tinels Elbow: negative negative Tinels Wrist: negative negative SI Joint: tender nontender Phalen: negative negative     DIAGNOSTIC RESULTS:  Noncontrast lumbar MRI from 05/10/2020 and Four view lumbar radiographs performed today.  They are all multilevel degenerative and arthritic changes throughout his lumbar spine.  L2-3 with a herniated disc that is worse on the right is causing significant foraminal stenosis.  There is retrolisthesis of L2 on L3 and significant disc degeneration with loss of disc height.     IMPRESSION:  The patient has severe foraminal stenosis and right leg weakness that is most likely due to the herniated disc and retrolisthesis at L2-L3. A significant discussion was had with the patient about potential treatments.  Due to the patient's unrelenting pain, right leg weakness, and disruption to his daily activities, the patient wishes to proceed with surgery.  Surgery will consist of a right L2-L3 XLIF with a lateral plate.  The risks  and benefits of the surgery were discussed with the patient.   PLAN: I have recommended the patient undergo a right-sided L2-L3 X lift with a lateral plate.  LSO brace prescription given to patient while in the office today.  He will follow-up in 3 weeks after his surgery with AP and lateral radiographs.  Detailed patient Education performed while in the office today.  Patient will need to have Cardiology clearance prior to his procedure.  Orders: Diagnostic Procedures: Assessment Procedure M54.16 Lumbar Spine- AP/Lat M54.16 Lumbar Spine- AP/Lat/Flex/Ex Instruction(s)/Education: Assessment Instruction R03.0 Lifestyle education Miscellaneous: Assessment  M54.16 LSO Brace  Completed Orders (this encounter) Order Details Reason Side Interpretation Result Initial Treatment Date Region Lifestyle education Patient will monitor and contact primary care physician if needed.       Lumbar Spine- AP/Lat/Flex/Ex      05/23/2020 All Levels to All Levels  Assessment/Plan  # Detail Type Description  1. Assessment Spondylolisthesis at L2-L3 level (M43.16).     2. Assessment Disc degeneration, lumbar (M51.36).     3. Assessment Spinal stenosis, lumbar region with neurogenic claudication (M48.062).     4. Assessment Herniated nucleus pulposus, lumbar (M51.26).     5. Assessment Chronic right-sided low back pain with right-sided sciatica (M54.41).     6. Assessment Other chronic pain (G89.29).  7. Assessment Radiculopathy, lumbar region (M54.16).  Plan Orders LSO Brace.     8. Assessment Elevated blood-pressure reading, w/o diagnosis of htn (R03.0).       Pain Management Plan Pain Scale: 4/10. Method: Numeric Pain Intensity Scale. Location: back. Onset: 10/11/2011. Duration: varies. Quality: discomforting. Pain management follow-up plan of care: Patient will continue medication management..               Provider:  Marchia Meiers. Vertell Limber MD  05/26/2020 01:04 PM    Dictation edited by: Fenton Malling, NP    CC Providers: Jani Gravel [facility] [place],  [place]  [id number]   Clydell Hakim PhD MD  [address] [place], [place] [address]               Electronically signed by Fenton Malling NP on 05/26/2020 01:04 PM

## 2020-06-15 DIAGNOSIS — Z125 Encounter for screening for malignant neoplasm of prostate: Secondary | ICD-10-CM | POA: Diagnosis not present

## 2020-06-15 DIAGNOSIS — I10 Essential (primary) hypertension: Secondary | ICD-10-CM | POA: Diagnosis not present

## 2020-06-15 DIAGNOSIS — E7800 Pure hypercholesterolemia, unspecified: Secondary | ICD-10-CM | POA: Diagnosis not present

## 2020-06-15 DIAGNOSIS — R739 Hyperglycemia, unspecified: Secondary | ICD-10-CM | POA: Diagnosis not present

## 2020-06-20 ENCOUNTER — Other Ambulatory Visit: Payer: Self-pay

## 2020-06-20 ENCOUNTER — Encounter: Payer: Self-pay | Admitting: Physician Assistant

## 2020-06-20 ENCOUNTER — Ambulatory Visit (INDEPENDENT_AMBULATORY_CARE_PROVIDER_SITE_OTHER): Payer: Medicare Other | Admitting: Physician Assistant

## 2020-06-20 ENCOUNTER — Telehealth: Payer: Self-pay | Admitting: *Deleted

## 2020-06-20 VITALS — BP 128/74 | HR 63 | Ht 68.5 in | Wt 168.8 lb

## 2020-06-20 DIAGNOSIS — Z01810 Encounter for preprocedural cardiovascular examination: Secondary | ICD-10-CM

## 2020-06-20 DIAGNOSIS — I2510 Atherosclerotic heart disease of native coronary artery without angina pectoris: Secondary | ICD-10-CM | POA: Diagnosis not present

## 2020-06-20 DIAGNOSIS — I480 Paroxysmal atrial fibrillation: Secondary | ICD-10-CM | POA: Diagnosis not present

## 2020-06-20 DIAGNOSIS — I10 Essential (primary) hypertension: Secondary | ICD-10-CM

## 2020-06-20 DIAGNOSIS — I350 Nonrheumatic aortic (valve) stenosis: Secondary | ICD-10-CM | POA: Diagnosis not present

## 2020-06-20 NOTE — Telephone Encounter (Signed)
Patient with diagnosis of afib on Xarelto for anticoagulation.    Procedure: RIGHT L2-L3 INTERIOR LATERAL ANTIBODY FUSION WITH LATERAL PLATE  Date of procedure: 06/29/20  CHADS2-VASc score of 3 (age, HTN, CAD)  CrCl 38mL/min Platelet count 286K  Per office protocol, patient can hold Xarelto for 3 days prior to procedure per protocol. Xarelto does not require 5-7 day hold for any procedure.

## 2020-06-20 NOTE — Patient Instructions (Signed)
Medication Instructions:  Your physician recommends that you continue on your current medications as directed. Please refer to the Current Medication list given to you today.  *If you need a refill on your cardiac medications before your next appointment, please call your pharmacy*   Lab Work: NONE ORDERED  If you have labs (blood work) drawn today and your tests are completely normal, you will receive your results only by:  [place] (if you have MyChart) OR  A paper copy in the mail If you have any lab test that is abnormal or we need to change your treatment, we will call you to review the results.   Testing/Procedures: NONE ORDERED   Follow-Up: At [facility], you and your health needs are our priority.  As part of our continuing mission to provide you with exceptional heart care, we have created designated Provider Care Teams.  These Care Teams include your primary Cardiologist (physician) and Advanced Practice Providers (APPs -  Physician Assistants and Nurse Practitioners) who all work together to provide you with the care you need, when you need it.  We recommend signing up for the patient portal called "MyChart".  Sign up information is provided on this After Visit Summary.  MyChart is used to connect with patients for Virtual Visits (Telemedicine).  Patients are able to view lab/test results, encounter notes, upcoming appointments, etc.  Non-urgent messages can be sent to your provider as well.   To learn more about what you can do with MyChart, go to [email or web address].    Your next appointment:   6 month(s)  The format for your next appointment:   In Person  Provider:   You may see Sherren Mocha, MD or one of the following Advanced Practiceroviders on your designated Care Team:    Richardson Dopp, PA-C  Robbie Lis, [place]    Other Instructions

## 2020-06-20 NOTE — Telephone Encounter (Signed)
Pt has appt today with Vin Bhagat. PAC. Will forward notes to [facility] for appt today.

## 2020-06-20 NOTE — Telephone Encounter (Signed)
Primary [place], MD  Chart reviewed as part of pre-operative protocol coverage. Because of Ohm A Feggins's past medical history and time since last visit, he/she will require a follow-up visit in order to better assess preoperative cardiovascular risk.  Pre-op covering staff: - Please schedule appointment and call patient to inform them. - Please contact requesting surgeon's office via preferred method (i.e, phone, fax) to inform them of need for appointment prior to surgery.  If applicable, this message will also be routed to pharmacy pool and/or primary cardiologist for input on holding anticoagulant/antiplatelet agent as requested below so that this information is available at time of patient's appointment.   Deberah Pelton, NP  06/20/2020, 2:34 PM

## 2020-06-20 NOTE — Telephone Encounter (Signed)
   [facility] Pre-operative Risk Assessment    HEARTCARE STAFF: - Please ensure there is not already an duplicate clearance open for this procedure. - Under Visit Info/Reason for Call, type in Other and utilize the format Clearance MM/DD/YY or Clearance TBD. Do not use dashes or single digits. - If request is for dental extraction, please clarify the # of teeth to be extracted.  Request for surgical clearance:  1. What type of surgery is being performed?   RIGHT L2-L3 INTERIOR LATERAL ANTIBODY FUSION WITH LATERAL PLATE   2. When is this surgery scheduled?  06/29/2020   3. What type of clearance is required (medical clearance vs. Pharmacy clearance to hold med vs. Both)?  BOTH  4. Are there any medications that need to be held prior to surgery and how long? XARELTO X'S 5-7 DAYS   5. Practice name and name of physician performing surgery?  [facility] / DR. Vertell Limber   6. What is the office phone number?  [phone]  ATTN:  JESSICA   7.   What is the office fax number?  [phone]  8.   Anesthesia type (None, local, MAC, general) ? GENERAL   Jeanann Lewandowsky 06/20/2020, 1:35 PM  _________________________________________________________________   (provider comments below)

## 2020-06-20 NOTE — Progress Notes (Signed)
Cardiology Office Note:    Date:  06/20/2020   ID:  [name], DOB 08-18-1946, MRN [id number]  PCP:  Jani Gravel, MD  [facility] Cardiologist:  Sherren Mocha, MD  [facility] Electrophysiologist:  None   Chief Complaint: Surgical clearance   History of Present Illness:    Cody Mcconnell is a 74 y.o. male with a hx of paroxysmal atrial fibrillation, nonrheumatic aortic valve stenosis, coronary calcification on CT scan, hypertension and hyperlipidemia presents for surgical clearance.  Patient has been anticoagulated with apixaban.  Low risk stress test in 2019.  Patient is here for surgical clearance. He reports no episode of recurrent atrial fibrillation in the past one and half year. Patient is very active at baseline. He swims 20 to 25-minute at least 3-4 times per week. He plays tennis and runs after horses. Recently has slowed down because of back pain. He is currently wearing heart monitor to evaluate recurrence of atrial fibrillation. He has self discontinued his Xarelto 3 weeks ago. He understands risks of stroke  Past Medical History:  Diagnosis Date  . Anemia    SLIGHT ANEMIA 4 MONTHS AGO  . Aortic valve stenosis 12/19/2017   Echo 07/30/16 - EF 55-60, normal wall motion, normal diastolic function, mild aortic stenosis (mean 9, peak 16), mild LAE // Echo 4/19:  EF 60-65, no RWMA, Gr 2 DD, mild AS (mean 11, peak 24)   . Arthritis    OA  . Benign prostate hyperplasia    unspecified whether lower urinary tract sym. present   . Carotid atherosclerosis    Carotid US 4/19:  bilat ICA 1-39; vertebrals and subclavians normal  . Carotid atherosclerosis, bilateral 04/30/2018   1-39% on 12/2017 Care Everywhere Echo  . Coronary artery calcification seen on CT scan 06/05/2018  . Dysrhythmia    ATRIAL FIB  . Elevated prostate specific antigen (PSA) 08/30/2016  . Glucose intolerance    "pre-diabetic"  . Hayfever   . Heart murmur   . History of exercise stress test     GXT 4/19:  ETT with good exercise tolerance (10:00); no chest pain; normal BP response; no diagnostic ST changes; negative adequate ETT.  Marland Kitchen HOH (hard of hearing)    BOTH EARS  . Low back pain with sciatica 06/02/2012  . Lumbar herniated disc 01/30/2018  . Mild aortic stenosis 04/30/2018   12/2017 Care Everywhere Echo : Mean gradient (S): 11 mm Hg. Peak gradient (S): 24 mm Hg.  . Numbness of right thumb    FROM CERVICAL NECK SURGERY  . Osteoarthritis of right hip 09/21/2018  . Overweight (BMI 25.0-29.9) 09/30/2018  . Paroxysmal atrial fibrillation ([place]) 06/05/2018  . Prediabetes 04/30/2018  . Prostatitis    unspecified prostatitis type  . Pure hypercholesterolemia     Past Surgical History:  Procedure Laterality Date  . CERVICAL SPINE SURGERY  2011  . COLONOSCOPY    . HIP SURGERY Right    arthroscopy  . LUMBAR DISC SURGERY  2014   L2-3, L3 TO L4 X 2 2019 AT [facility]  . SHOULDER ARTHROSCOPY Right   . TOTAL HIP ARTHROPLASTY Right 09/29/2018   Procedure: TOTAL HIP ARTHROPLASTY ANTERIOR APPROACH;  Surgeon: Paralee Cancel, MD;  Location: [facility];  Service: Orthopedics;  Laterality: Right;  71    Current Medications: Current Meds  Medication Sig  . acetaminophen (TYLENOL) 325 MG tablet Take 650 mg by mouth every 6 (six) hours as needed for moderate pain.  . cetirizine (ZYRTEC) 10 MG  tablet Take 10 mg by mouth every other day.   . diltiazem (CARDIZEM CD) 120 MG 24 hr capsule TAKE 1 CAPSULE(120 MG) BY MOUTH DAILY  . fexofenadine (ALLEGRA ALLERGY) 180 MG tablet Take 180 mg by mouth every other day.   . fluticasone (FLONASE) 50 MCG/ACT nasal spray Place 1 spray into both nostrils daily as needed for allergies.   . Multiple Vitamins-Minerals (MULTIVITAMIN ADULT PO) Take 1 tablet by mouth daily.  . rivaroxaban (XARELTO) 20 MG TABS tablet Take 1 tablet (20 mg total) by mouth daily with supper.  . simvastatin (ZOCOR) 40 MG tablet Take 40 mg by mouth daily.  . TURMERIC PO Take 1 tablet by mouth daily.       Allergies:   Patient has no known allergies.   Social History   Socioeconomic History  . Marital status: Married    Spouse name: Not on file  . Number of children: 2  . Years of education: Not on file  . Highest education level: Not on file  Occupational History  . 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 Occupation: Long Term Care Facilities    Comment: Owns several  Tobacco Use  . Smoking status: Former Smoker    Packs/day: 2.00    Years: 9.00    Pack years: 18.00  . Smokeless tobacco: Never Used  . Tobacco comment: QUIT 1081  Vaping Use  . Vaping Use: Never used  Substance and Sexual Activity  . Alcohol use: Never  . Drug use: Never  . Sexual activity: Not on file  Other Topics Concern  . 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 Difficulty of Paying Living Expenses: Not on file  Food Insecurity:   . Worried About Charity fundraiser in the Last Year: Not on file  . Ran Out of Food in the Last Year: Not on file  Transportation Needs:   . Lack of Transportation (Medical): Not on file  . Lack of Transportation (Non-Medical): Not on file  Physical Activity:   . Days of Exercise per Week: Not on file  . Minutes of Exercise per Session: Not on file  Stress:   . Feeling of Stress : Not on file  Social Connections:   . Frequency of Communication with Friends and Family: Not on file  . Frequency of Social Gatherings with Friends and Family: Not on file  . Attends Religious Services: Not on file  . Active Member of Clubs or Organizations: Not on file  . Attends Archivist Meetings: Not on file  . Marital Status: Not on file     Family History: The patient's family history includes CAD in his father; CAD (age of onset: 13) in his brother; Congestive Heart Failure in his father;  Dementia in his mother.    ROS:   Please see the history of present illness.    All other systems reviewed and are negative.  EKGs/Labs/Other Studies Reviewed:    The following studies were reviewed today:  Echo 12/2017 Study Conclusions   - Left ventricle: The cavity size was normal. Wall thickness was  normal. Systolic function was normal. The estimated ejection  fraction was in the range of 60% to 65%. Wall motion was normal;  there were no regional wall motion  abnormalities. Features are  consistent with a pseudonormal left ventricular filling pattern,  with concomitant abnormal relaxation and increased filling  pressure (grade 2 diastolic dysfunction).  - Aortic valve: There was mild stenosis. Mean gradient (S): 11 mm  Hg. Peak gradient (S): 24 mm Hg.   ETT 12/31/2017  Blood pressure demonstrated a normal response to exercise.   ETT with good exercise tolerance (10:00); no chest pain; normal BP response; no diagnostic ST changes; negative adequate ETT.   EKG:  EKG is  ordered today.  The ekg ordered today demonstrates NSR at rate of 63 bpm  Recent Labs: No results found for requested labs within last 8760 hours.  Recent Lipid Panel No results found for: CHOL, TRIG, HDL, CHOLHDL, VLDL, LDLCALC, LDLDIRECT   Risk Assessment/Calculations:      :[id number]}  CHA2DS2-VASc Score = 2  This indicates a 2.2% annual risk of stroke. The patient's score is based upon: CHF History: 0 HTN History: 1 Diabetes History: 0 Stroke History: 0 Vascular Disease History: 0 Age Score: 1 Gender Score: 0   Physical Exam:    VS:  BP 128/74   Pulse 63   Ht 5' 8.5" (1.74 m)   Wt 168 lb 12.8 oz (76.6 kg)   SpO2 96%   BMI 25.29 kg/m     Wt Readings from Last 3 Encounters:  06/20/20 168 lb 12.8 oz (76.6 kg)  04/11/20 162 lb (73.5 kg)  09/15/19 181 lb 12.8 oz (82.5 kg)     GEN: Well nourished, well developed in no acute distress HEENT: Normal NECK: No JVD; No  carotid bruits LYMPHATICS: No lymphadenopathy CARDIAC: RRR, systolic murmurs, rubs, gallops RESPIRATORY:  Clear to auscultation without rales, wheezing or rhonchi  ABDOMEN: Soft, non-tender, non-distended MUSCULOSKELETAL:  No edema; No deformity  SKIN: Warm and dry NEUROLOGIC:  Alert and oriented x 3 PSYCHIATRIC:  Normal affect   ASSESSMENT AND PLAN:    1. Paroxysmal atrial fibrillation Maintaining sinus rhythm. Currently wearing monitor  to evaluate atrial fibrillation. He has self discontinued his Xarelto. CHA2DS2-VASc risk of 1-2 based on age or questionable history of hypertension. Patient denies history of hypertension and states that he is on Cardizem for rate control. He understands risks of stroke while off anticoagulation. He is planning to start aspirin 81 mg after his back surgery.  2. Aortic stenosis -Mild by echocardiogram in 2019. Patient without history of syncope. Does extraneous exercise/activity. No exertional limitation.  3. Surgical clearance -Patient is easily getting greater than 4 METS of activity.Given past medical history and time since last visit, based on ACC/AHA guidelines, Cody Mcconnell would be at acceptable risk for the planned procedure without further cardiovascular testing.   Per Pharmacist "Per office protocol, patient can hold Xarelto for 3 days prior to procedure per protocol. Xarelto does not require 5-7 day hold for any procedure." however he has self discontinued Xarelto.    I will route this recommendation to the requesting party via Epic fax function and remove from pre-op pool.  Please call with questions.  Medication Adjustments/Labs and Tests Ordered: Current medicines are reviewed at length with the patient today.  Concerns regarding medicines are outlined above.  No orders of the defined types were placed in this encounter.  No orders of the defined types were placed in this encounter.   Patient Instructions  Medication  Instructions:  Your physician recommends that you continue on your current medications as directed. Please refer to the Current Medication list given to you today.  *  If you need a refill on your cardiac medications before your next appointment, please call your pharmacy*   Lab Work: NONE ORDERED  If you have labs (blood work) drawn today and your tests are completely normal, you will receive your results only by: Marland Kitchen MyChart Message (if you have MyChart) OR . A paper copy in the mail If you have any lab test that is abnormal or we need to change your treatment, we will call you to review the results.   Testing/Procedures: NONE ORDERED   Follow-Up: At [facility], you and your health needs are our priority.  As part of our continuing mission to provide you with exceptional heart care, we have created designated Provider Care Teams.  These Care Teams include your primary Cardiologist (physician) and Advanced Practice Providers (APPs -  Physician Assistants and Nurse Practitioners) who all work together to provide you with the care you need, when you need it.  We recommend signing up for the patient portal called "MyChart".  Sign up information is provided on this After Visit Summary.  MyChart is used to connect with patients for Virtual Visits (Telemedicine).  Patients are able to view lab/test results, encounter notes, upcoming appointments, etc.  Non-urgent messages can be sent to your provider as well.   To learn more about what you can do with MyChart, go to [email or web address].    Your next appointment:   6 month(s)  The format for your next appointment:   In Person  Provider:   You may see Sherren Mocha, MD or one of the following Advanced Practiceroviders on your designated Care Team:    Richardson Dopp, PA-C  Robbie Lis, PA-C    Other Instructions      Signed, Leanor Kail, [place]  06/20/2020 4:21 PM    [place]

## 2020-06-21 NOTE — Addendum Note (Signed)
Addended by: Georgiann Cocker on: 06/21/2020 02:28 PM   Modules accepted: Orders

## 2020-06-22 DIAGNOSIS — E785 Hyperlipidemia, unspecified: Secondary | ICD-10-CM | POA: Diagnosis not present

## 2020-06-22 DIAGNOSIS — R739 Hyperglycemia, unspecified: Secondary | ICD-10-CM | POA: Diagnosis not present

## 2020-06-22 DIAGNOSIS — N529 Male erectile dysfunction, unspecified: Secondary | ICD-10-CM | POA: Diagnosis not present

## 2020-06-22 DIAGNOSIS — I4811 Longstanding persistent atrial fibrillation: Secondary | ICD-10-CM | POA: Diagnosis not present

## 2020-06-22 DIAGNOSIS — I350 Nonrheumatic aortic (valve) stenosis: Secondary | ICD-10-CM | POA: Diagnosis not present

## 2020-06-22 DIAGNOSIS — I10 Essential (primary) hypertension: Secondary | ICD-10-CM | POA: Diagnosis not present

## 2020-06-22 DIAGNOSIS — M169 Osteoarthritis of hip, unspecified: Secondary | ICD-10-CM | POA: Diagnosis not present

## 2020-06-22 DIAGNOSIS — Z23 Encounter for immunization: Secondary | ICD-10-CM | POA: Diagnosis not present

## 2020-06-22 DIAGNOSIS — Z0000 Encounter for general adult medical examination without abnormal findings: Secondary | ICD-10-CM | POA: Diagnosis not present

## 2020-06-22 DIAGNOSIS — D649 Anemia, unspecified: Secondary | ICD-10-CM | POA: Diagnosis not present

## 2020-06-22 DIAGNOSIS — R972 Elevated prostate specific antigen [PSA]: Secondary | ICD-10-CM | POA: Diagnosis not present

## 2020-06-23 NOTE — Telephone Encounter (Signed)
Clearance was faxed after office visit with Robbie Lis, PA-C 06/20/2020

## 2020-06-26 ENCOUNTER — Other Ambulatory Visit: Payer: Self-pay | Admitting: Physician Assistant

## 2020-06-26 DIAGNOSIS — Z9622 Myringotomy tube(s) status: Secondary | ICD-10-CM | POA: Diagnosis not present

## 2020-06-26 DIAGNOSIS — H903 Sensorineural hearing loss, bilateral: Secondary | ICD-10-CM | POA: Diagnosis not present

## 2020-06-26 DIAGNOSIS — H9312 Tinnitus, left ear: Secondary | ICD-10-CM | POA: Diagnosis not present

## 2020-06-26 DIAGNOSIS — H6982 Other specified disorders of Eustachian tube, left ear: Secondary | ICD-10-CM | POA: Diagnosis not present

## 2020-06-26 DIAGNOSIS — R42 Dizziness and giddiness: Secondary | ICD-10-CM | POA: Diagnosis not present

## 2020-06-26 NOTE — Progress Notes (Signed)
[facility] [place], [place] AT [facility] [address] [place] [address] Phone: [phone] Fax: [phone]      Your procedure is scheduled on October 21  Report to [facility] Main Entrance "A" at 0830 A.M., and check in at the Admitting office.  Call this number if you have problems the morning of surgery:  [phone]  Call [phone] if you have any questions prior to your surgery date Monday-Friday 8am-4pm    Remember:  Do not eat or drink after midnight the night before your surgery    Take these medicines the morning of surgery with A SIP OF WATER  acetaminophen (TYLENOL)  If needed cetirizine (ZYRTEC) if it is your day to take it if not take allerga fexofenadine (ALLEGRA ALLERGY if it is your day to take it if not take zyrtec diltiazem (CARDIZEM CD)  fluticasone (FLONASE) if needed simvastatin (ZOCOR  Follow your surgeon's instructions on when to stop rivaroxaban (XARELTO.  If no instructions were given by your surgeon then you will need to call the office to get those instructions.    As of today, STOP taking any Aspirin (unless otherwise instructed by your surgeon) Aleve, Naproxen, Ibuprofen, Motrin, Advil, Goody's, BC's, all herbal medications, fish oil, and all vitamins.                      Do not wear jewelry            Do not wear lotions, powders, colognes, or deodorant.            Men may shave face and neck.            Do not bring valuables to the hospital.            [facility] is not responsible for any belongings or valuables.  Do NOT Smoke (Tobacco/Vaping) or drink Alcohol 24 hours prior to your procedure If you use a CPAP at night, you may bring all equipment for your overnight stay.   Contacts, glasses, dentures or bridgework may not be worn into surgery.      For patients admitted to the hospital, discharge time will be determined by your treatment team.   Patients discharged the  day of surgery will not be allowed to drive home, and someone needs to stay with them for 24 hours.    Special instructions:   [place]- Preparing For Surgery  Before surgery, you can play an important role. Because skin is not sterile, your skin needs to be as free of germs as possible. You can reduce the number of germs on your skin by washing with CHG (chlorahexidine gluconate) Soap before surgery.  CHG is an antiseptic cleaner which kills germs and bonds with the skin to continue killing germs even after washing.    Oral Hygiene is also important to reduce your risk of infection.  Remember - BRUSH YOUR TEETH THE MORNING OF SURGERY WITH YOUR REGULAR TOOTHPASTE  Please do not use if you have an allergy to CHG or antibacterial soaps. If your skin becomes reddened/irritated stop using the CHG.  Do not shave (including legs and underarms) for at least 48 hours prior to first CHG shower. It is OK to shave your face.  Please follow these instructions carefully.   1. Shower the NIGHT BEFORE SURGERY and the MORNING OF SURGERY with CHG Soap.   2. If you  chose to wash your hair, wash your hair first as usual with your normal shampoo.  3. After you shampoo, rinse your hair and body thoroughly to remove the shampoo.  4. Use CHG as you would any other liquid soap. You can apply CHG directly to the skin and wash gently with a scrungie or a clean washcloth.   5. Apply the CHG Soap to your body ONLY FROM THE NECK DOWN.  Do not use on open wounds or open sores. Avoid contact with your eyes, ears, mouth and genitals (private parts). Wash Face and genitals (private parts)  with your normal soap.   6. Wash thoroughly, paying special attention to the area where your surgery will be performed.  7. Thoroughly rinse your body with warm water from the neck down.  8. DO NOT shower/wash with your normal soap after using and rinsing off the CHG Soap.  9. Pat yourself dry with a CLEAN TOWEL.  10. Wear  CLEAN PAJAMAS to bed the night before surgery  11. Place CLEAN SHEETS on your bed the night of your first shower and DO NOT SLEEP WITH PETS.   Day of Surgery: Wear Clean/Comfortable clothing the morning of surgery Do not apply any deodorants/lotions.   Remember to brush your teeth WITH YOUR REGULAR TOOTHPASTE.   Please read over the following fact sheets that you were given.

## 2020-06-27 ENCOUNTER — Encounter (HOSPITAL_COMMUNITY)
Admission: RE | Admit: 2020-06-27 | Discharge: 2020-06-27 | Disposition: A | Discharge status: Home / Self Care | Payer: Medicare Other | Source: Ambulatory Visit | Attending: Neurosurgery | Admitting: Neurosurgery

## 2020-06-27 ENCOUNTER — Other Ambulatory Visit: Payer: Self-pay

## 2020-06-27 ENCOUNTER — Encounter (HOSPITAL_COMMUNITY): Payer: Self-pay

## 2020-06-27 ENCOUNTER — Other Ambulatory Visit (HOSPITAL_COMMUNITY): Payer: Medicare Other

## 2020-06-27 DIAGNOSIS — Z01812 Encounter for preprocedural laboratory examination: Secondary | ICD-10-CM | POA: Diagnosis not present

## 2020-06-27 DIAGNOSIS — I2510 Atherosclerotic heart disease of native coronary artery without angina pectoris: Secondary | ICD-10-CM | POA: Diagnosis not present

## 2020-06-27 DIAGNOSIS — I480 Paroxysmal atrial fibrillation: Secondary | ICD-10-CM | POA: Insufficient documentation

## 2020-06-27 DIAGNOSIS — I10 Essential (primary) hypertension: Secondary | ICD-10-CM | POA: Insufficient documentation

## 2020-06-27 DIAGNOSIS — E785 Hyperlipidemia, unspecified: Secondary | ICD-10-CM | POA: Insufficient documentation

## 2020-06-27 LAB — TYPE AND SCREEN
ABO/RH(D): B POS
Antibody Screen: NEGATIVE

## 2020-06-27 LAB — BASIC METABOLIC PANEL
Anion gap: 9 (ref 5–15)
BUN: 24 mg/dL — ABNORMAL HIGH (ref 8–23)
CO2: 21 mmol/L — ABNORMAL LOW (ref 22–32)
Calcium: 9.2 mg/dL (ref 8.9–10.3)
Chloride: 109 mmol/L (ref 98–111)
Creatinine, Ser: 0.91 mg/dL (ref 0.61–1.24)
GFR, Estimated: >60 mL/min (ref >60)
Glucose, Bld: 106 mg/dL — ABNORMAL HIGH (ref 70–99)
Potassium: 4.1 mmol/L (ref 3.5–5.1)
Sodium: 139 mmol/L (ref 135–145)

## 2020-06-27 LAB — CBC
HCT: 42.3 % (ref 39.0–52.0)
Hemoglobin: 13.4 g/dL (ref 13.0–17.0)
MCH: 27 pg (ref 26.0–34.0)
MCHC: 31.7 g/dL (ref 30.0–36.0)
MCV: 85.3 fL (ref 80.0–100.0)
Platelets: 257 10*3/uL (ref 150–400)
RBC: 4.96 MIL/uL (ref 4.22–5.81)
RDW: 13.4 % (ref 11.5–15.5)
WBC: 6.9 10*3/uL (ref 4.0–10.5)
nRBC: 0 % (ref 0.0–0.2)

## 2020-06-27 LAB — SURGICAL PCR SCREEN
MRSA, PCR: POSITIVE — AB
Staphylococcus aureus: POSITIVE — AB

## 2020-06-27 LAB — PROTIME-INR
INR: 1.1 (ref 0.8–1.2)
Prothrombin Time: 13.9 seconds (ref 11.4–15.2)

## 2020-06-27 NOTE — Progress Notes (Signed)
Patients surgical PCR positive for MRSA and MSSA, left message for Cody Mcconnell with Dr. Donald Pore office about positive result asking for addition of Vancomycin antibiotic

## 2020-06-27 NOTE — Progress Notes (Signed)
PCP - Jani Gravel Cardiologist - Sherren Mocha   Chest x-ray - n/a EKG - 06/20/20 Stress Test - 2019 ECHO - 2019 Cardiac Cath - denies   Blood Thinner Instructions: Xarelto LD 3 weeks ago   COVID TEST- 06/27/20   Anesthesia review: yes, cardiac hx, afib  Patient denies shortness of breath, fever, cough and chest pain at PAT appointment   All instructions explained to the patient, with a verbal understanding of the material. Patient agrees to go over the instructions while at home for a better understanding. Patient also instructed to self quarantine after being tested for COVID-19. The opportunity to ask questions was provided.

## 2020-06-28 ENCOUNTER — Other Ambulatory Visit (HOSPITAL_COMMUNITY)
Admission: RE | Admit: 2020-06-28 | Discharge: 2020-06-28 | Disposition: A | Discharge status: Home / Self Care | Payer: Medicare Other | Source: Ambulatory Visit | Attending: Neurosurgery | Admitting: Neurosurgery

## 2020-06-28 DIAGNOSIS — Z01812 Encounter for preprocedural laboratory examination: Secondary | ICD-10-CM | POA: Diagnosis not present

## 2020-06-28 DIAGNOSIS — Z20822 Contact with and (suspected) exposure to covid-19: Secondary | ICD-10-CM | POA: Diagnosis not present

## 2020-06-28 LAB — HEMOGLOBIN A1C
Hgb A1c MFr Bld: 6.1 % — ABNORMAL HIGH (ref 4.8–5.6)
Mean Plasma Glucose: 128 mg/dL

## 2020-06-28 LAB — SARS CORONAVIRUS 2 (TAT 6-24 HRS): SARS Coronavirus 2: NEGATIVE

## 2020-06-28 NOTE — Progress Notes (Signed)
Anesthesia Chart Review:  Follows with cardiology for history of paroxysmal atrial fibrillation on apixaban, mild aortic valve stenosis, coronary calcification on CT, HTN, HLD.  Low risk stress test in 319.  Seen 06/20/2020 for preoperative clearance.  Per note, patient is very active at baseline, swims 20-25 minutes 3-4 times a week, plays tennis.  He reports no episodes of recurrent atrial fibrillation for the past year and a half and self discontinued his apixaban.  This was discussed at his appointment, his CHA2DS2-VASc risk with calculated be 1-2 based on age questionable history of hypertension.  Plans to start ASA 81 mg after back surgery.  Surgical clearance per note, "-Patient is easily getting greater than 4 METS of activity.Given past medical history and time since last visit, based on ACC/AHA guidelines, Cody Mcconnell would be at acceptable risk for the planned procedure without further cardiovascular testing.  Per Pharmacist "Per office protocol, patient can holdXareltofor 3days prior to procedure per protocol. Xarelto does not require 5-7 day hold for any procedure." however he has self discontinued Xarelto."  Preop labs reviewed, unremarkable.  EKG 06/20/2020: Minus rhythm.  Rate 63.  TTE 12/31/2017: - Left ventricle: The cavity size was normal. Wall thickness was  normal. Systolic function was normal. The estimated ejection  fraction was in the range of 60% to 65%. Wall motion was normal;  there were no regional wall motion abnormalities. Features are  consistent with a pseudonormal left ventricular filling pattern,  with concomitant abnormal relaxation and increased filling  pressure (grade 2 diastolic dysfunction).  - Aortic valve: There was mild stenosis. Mean gradient (S): 11 mm  Hg. Peak gradient (S): 24 mm Hg.   Exercise tolerance test 12/31/2017:  Blood pressure demonstrated a normal response to exercise.   ETT with good exercise tolerance (10:00); no  chest pain; normal BP response; no diagnostic ST changes; negative adequate ETT.   Wynonia Musty [facility] Short Stay Center/Anesthesiology Phone [phone] 06/28/2020 9:03 AM

## 2020-06-28 NOTE — Anesthesia Preprocedure Evaluation (Addendum)
Anesthesia Evaluation  Patient identified by MRN, date of birth, ID band Patient awake    Reviewed: Allergy & Precautions, NPO status , Patient's Chart, lab work & pertinent test results  History of Anesthesia Complications Negative for: history of anesthetic complications  Airway Mallampati: II  TM Distance: >3 FB Neck ROM: Full    Dental  (+) Dental Advisory Given, Teeth Intact   Pulmonary neg shortness of breath, neg sleep apnea, neg COPD, neg recent URI, former smoker,  Covid-19 Nucleic Acid Test Results Lab Results      Component                Value               Date                      SARSCOV2NAA              NEGATIVE            06/28/2020              breath sounds clear to auscultation       Cardiovascular + CAD  + dysrhythmias + Valvular Problems/Murmurs AS  Rhythm:Regular + Systolic murmurs - stress 2019 Left ventricle: The cavity size was normal. Wall thickness was  normal. Systolic function was normal. The estimated ejection  fraction was in the range of 60% to 65%. Wall motion was normal;  there were no regional wall motion abnormalities. Features are  consistent with a pseudonormal left ventricular filling pattern,  with concomitant abnormal relaxation and increased filling  pressure (grade 2 diastolic dysfunction).  - Aortic valve: There was mild stenosis. Mean gradient (S): 11 mm  Hg. Peak gradient (S): 24 mm Hg.    Neuro/Psych neg Seizures  Neuromuscular disease negative psych ROS   GI/Hepatic negative GI ROS, Neg liver ROS,   Endo/Other  negative endocrine ROS  Renal/GU negative Renal ROSLab Results      Component                Value               Date                      CREATININE               0.91                06/27/2020                Musculoskeletal  (+) Arthritis ,   Abdominal   Peds  Hematology negative hematology ROS (+) Lab Results      Component                 Value               Date                      WBC                      6.9                 06/27/2020                HGB                      13.4  06/27/2020                HCT                      42.3                06/27/2020                MCV                      85.3                06/27/2020                PLT                      257                 06/27/2020              Anesthesia Other Findings   Reproductive/Obstetrics                            Anesthesia Physical Anesthesia Plan  ASA: II  Anesthesia Plan: General   Post-op Pain Management:    Induction: Intravenous  PONV Risk Score and Plan: 2 and Ondansetron and Dexamethasone  Airway Management Planned: Oral ETT  Additional Equipment: None  Intra-op Plan:   Post-operative Plan: Extubation in OR  Informed Consent: I have reviewed the patients History and Physical, chart, labs and discussed the procedure including the risks, benefits and alternatives for the proposed anesthesia with the patient or authorized representative who has indicated his/her understanding and acceptance.     Dental advisory given  Plan Discussed with: CRNA and Surgeon  Anesthesia Plan Comments: (PAT note by Karoline Caldwell, PA-C: Follows with cardiology for history of paroxysmal atrial fibrillation on apixaban, mild aortic valve stenosis, coronary calcification on CT, HTN, HLD.  Low risk stress test in 319.  Seen 06/20/2020 for preoperative clearance.  Per note, patient is very active at baseline, swims 20-25 minutes 3-4 times a week, plays tennis.  He reports no episodes of recurrent atrial fibrillation for the past year and a half and self discontinued his apixaban.  This was discussed at his appointment, his CHA2DS2-VASc risk with calculated be 1-2 based on age questionable history of hypertension.  Plans to start ASA 81 mg after back surgery.  Surgical clearance per note, "-Patient is easily getting  greater than 4 METS of activity.Given past medical history and time since last visit, based on ACC/AHA guidelines, Correy A Abee would be at acceptable risk for the planned procedure without further cardiovascular testing.  Per Pharmacist "Per office protocol, patient can holdXareltofor 3days prior to procedure per protocol. Xarelto does not require 5-7 day hold for any procedure." however he has self discontinued Xarelto."  Preop labs reviewed, unremarkable.  EKG 06/20/2020: Minus rhythm.  Rate 63.  TTE 12/31/2017: - Left ventricle: The cavity size was normal. Wall thickness was  normal. Systolic function was normal. The estimated ejection  fraction was in the range of 60% to 65%. Wall motion was normal;  there were no regional wall motion abnormalities. Features are  consistent with a pseudonormal left ventricular filling pattern,  with concomitant abnormal relaxation and increased filling  pressure (grade 2 diastolic dysfunction).  - Aortic valve: There was mild stenosis. Mean gradient (S): 11  mm  Hg. Peak gradient (S): 24 mm Hg.   Exercise tolerance test 12/31/2017: Blood pressure demonstrated a normal response to exercise.   ETT with good exercise tolerance (10:00); no chest pain; normal BP response; no diagnostic ST changes; negative adequate ETT.  )       Anesthesia Quick Evaluation

## 2020-06-29 ENCOUNTER — Inpatient Hospital Stay (HOSPITAL_COMMUNITY): Payer: Medicare Other | Admitting: Certified Registered Nurse Anesthetist

## 2020-06-29 ENCOUNTER — Inpatient Hospital Stay (HOSPITAL_COMMUNITY): Payer: Medicare Other

## 2020-06-29 ENCOUNTER — Inpatient Hospital Stay (HOSPITAL_COMMUNITY): Payer: Medicare Other | Admitting: Physician Assistant

## 2020-06-29 ENCOUNTER — Observation Stay (HOSPITAL_COMMUNITY)
Admission: RE | Admit: 2020-06-29 | Discharge: 2020-06-30 | Disposition: A | Discharge status: Home / Self Care | Payer: Medicare Other | Attending: Neurosurgery | Admitting: Neurosurgery

## 2020-06-29 ENCOUNTER — Encounter (HOSPITAL_COMMUNITY): Payer: Self-pay | Admitting: Neurosurgery

## 2020-06-29 ENCOUNTER — Other Ambulatory Visit: Payer: Self-pay

## 2020-06-29 ENCOUNTER — Encounter (HOSPITAL_COMMUNITY)
Admission: RE | Disposition: A | Discharge status: Home / Self Care | Payer: Self-pay | Source: Home / Self Care | Attending: Neurosurgery

## 2020-06-29 DIAGNOSIS — M5116 Intervertebral disc disorders with radiculopathy, lumbar region: Secondary | ICD-10-CM | POA: Diagnosis not present

## 2020-06-29 DIAGNOSIS — M4326 Fusion of spine, lumbar region: Secondary | ICD-10-CM | POA: Diagnosis not present

## 2020-06-29 DIAGNOSIS — Z7982 Long term (current) use of aspirin: Secondary | ICD-10-CM | POA: Diagnosis not present

## 2020-06-29 DIAGNOSIS — Z419 Encounter for procedure for purposes other than remedying health state, unspecified: Secondary | ICD-10-CM

## 2020-06-29 DIAGNOSIS — Z87891 Personal history of nicotine dependence: Secondary | ICD-10-CM | POA: Diagnosis not present

## 2020-06-29 DIAGNOSIS — M48062 Spinal stenosis, lumbar region with neurogenic claudication: Secondary | ICD-10-CM | POA: Insufficient documentation

## 2020-06-29 DIAGNOSIS — M4316 Spondylolisthesis, lumbar region: Principal | ICD-10-CM | POA: Insufficient documentation

## 2020-06-29 DIAGNOSIS — E7800 Pure hypercholesterolemia, unspecified: Secondary | ICD-10-CM | POA: Diagnosis not present

## 2020-06-29 DIAGNOSIS — M48061 Spinal stenosis, lumbar region without neurogenic claudication: Secondary | ICD-10-CM | POA: Diagnosis not present

## 2020-06-29 DIAGNOSIS — I480 Paroxysmal atrial fibrillation: Secondary | ICD-10-CM | POA: Diagnosis not present

## 2020-06-29 DIAGNOSIS — M549 Dorsalgia, unspecified: Secondary | ICD-10-CM | POA: Diagnosis present

## 2020-06-29 HISTORY — PX: ANTERIOR LAT LUMBAR FUSION: SHX1168

## 2020-06-29 IMAGING — RF DG C-ARM 1-60 MIN
1 series · 3 of 3 positions shown · IV contrast (agent unspecified)
Comparison: None.

CLINICAL DATA: L2-3 fusion with instrumentation. Inaccurate needle
count.

EXAM:
DG C-ARM 1-60 MIN
CONTRAST:  None
FLUOROSCOPY TIME:  Fluoroscopy Time: Not provided, refer to
operative report
Radiation Exposure Index (if provided by the fluoroscopic device):
Not provided, refer to operative report
Number of Acquired Spot Images: 3

[Series 1: run · 3 of 3 slices shown]
[im 1/3]
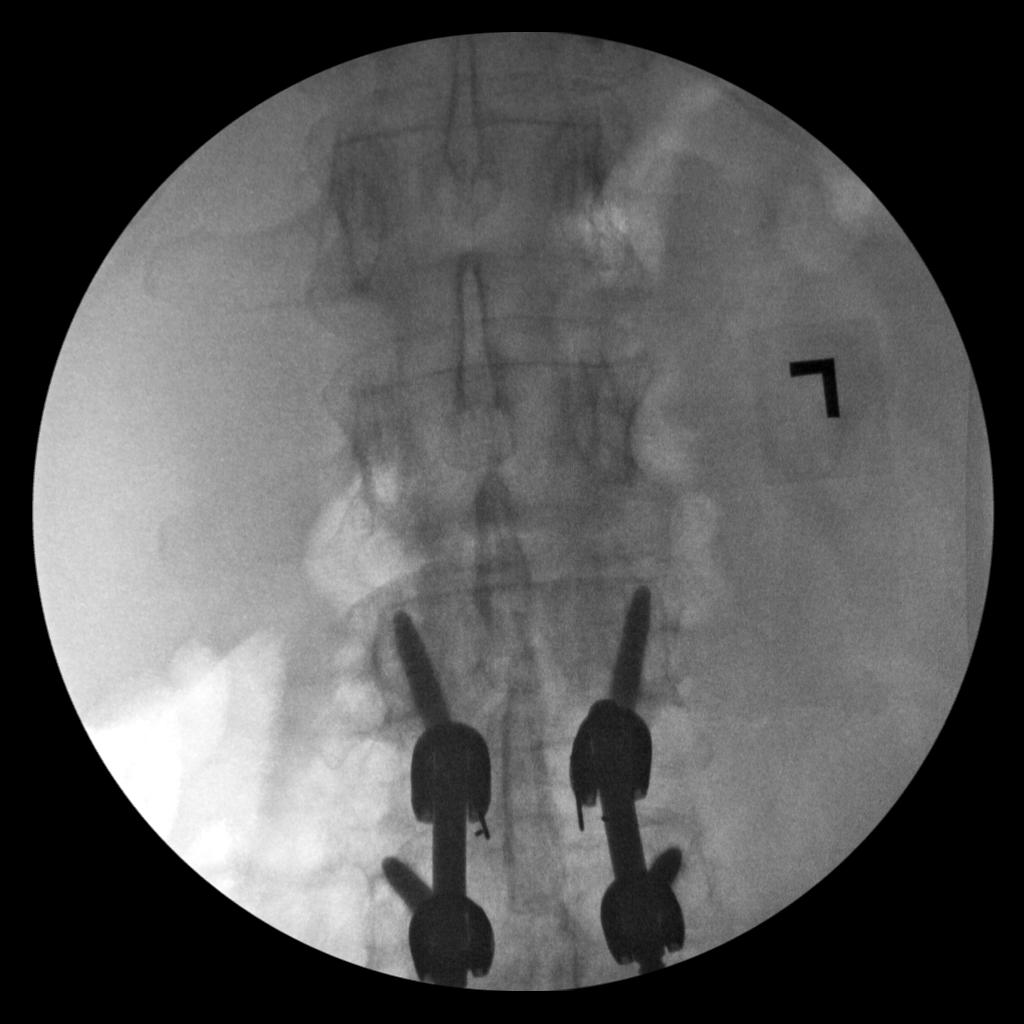
[im 2/3]
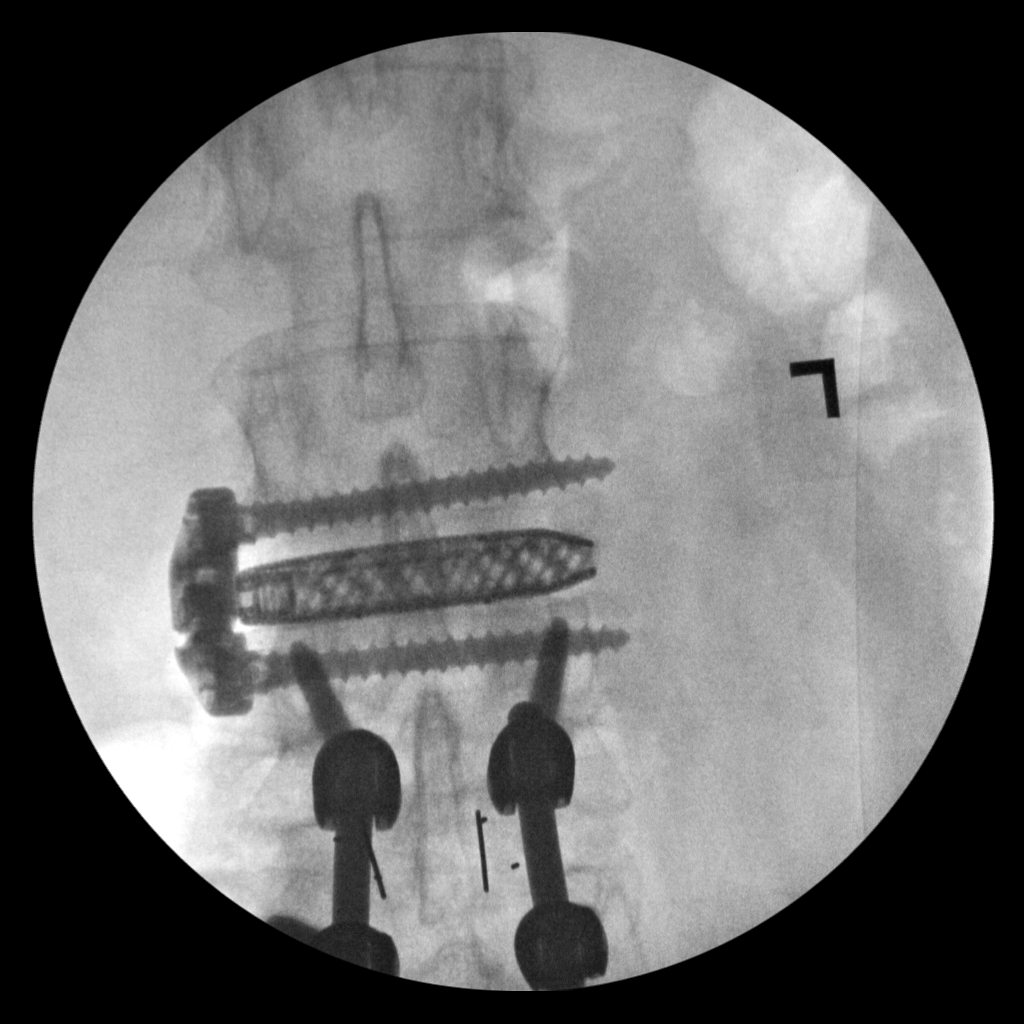
[im 3/3]
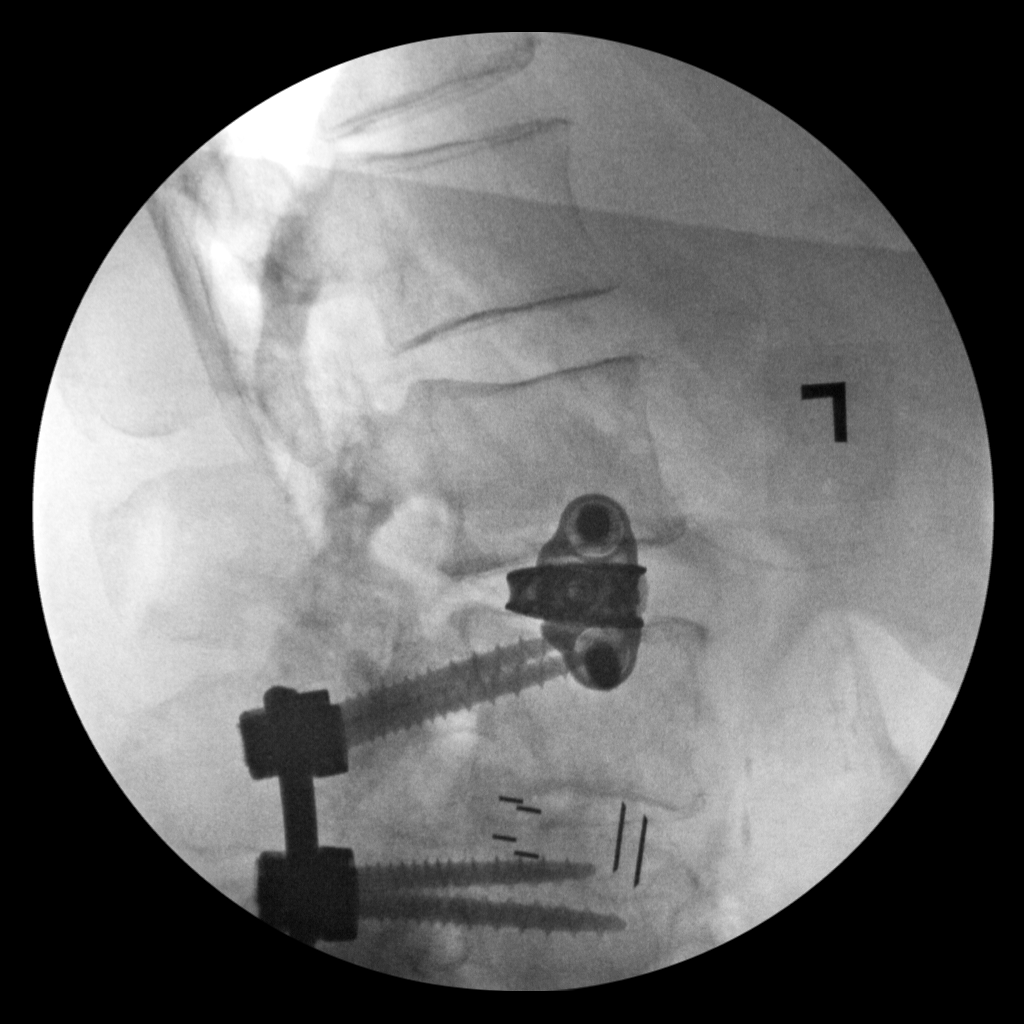

[3 of 3 positions shown; findings below may reference images not displayed]

FINDINGS: Three fluoroscopic intraoperative radiographs of L1-L3 and the
superior endplate of L4 are presented for interpretation. These
images demonstrate extreme lateral discectomy and fusion with
instrumentation of L2-3 with interbody bone cage, a right lateral
cortical plate and subcortical and plate screws. Pre-existing
hardware related to L3-4 fusion with instrumentation is partially
visualized. No unexpected radiopaque foreign body identified within
the visualized segment.
IMPRESSION: No unexpected retained radiopaque foreign body within the visualized
segment.

These results were called by telephone at the time of interpretation
on [DATE] at [DATE] to provider SENRVIRATHNE , who verbally
acknowledged these results.

## 2020-06-29 IMAGING — RF DG LUMBAR SPINE 2-3V
1 series · 3 of 3 positions shown · non-contrast
Comparison: [DATE]

CLINICAL DATA: L2-3 fusion with instrumentation. Inaccurate needle
count.

EXAM:
LUMBAR SPINE - 2-3 VIEW

[Series 1: run · 3 of 3 slices shown]
[im 1/3]
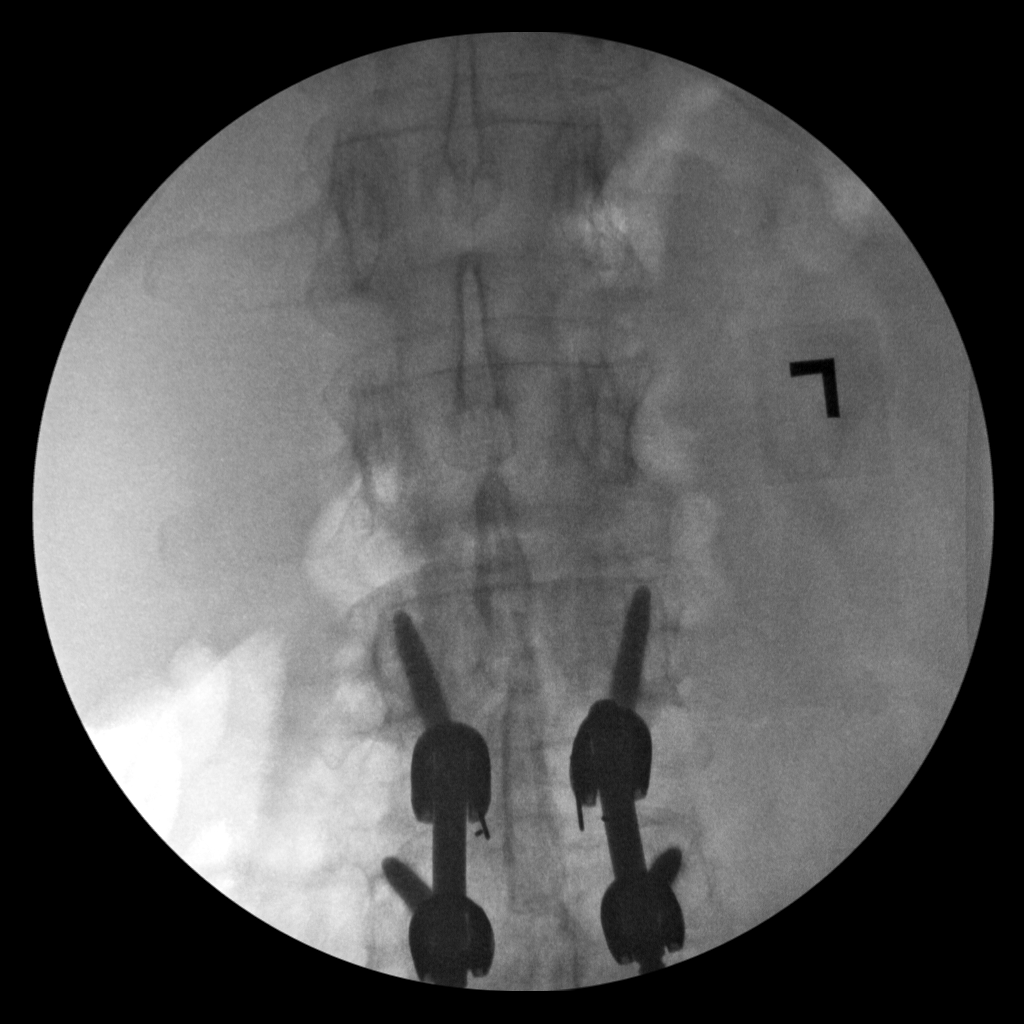
[im 2/3]
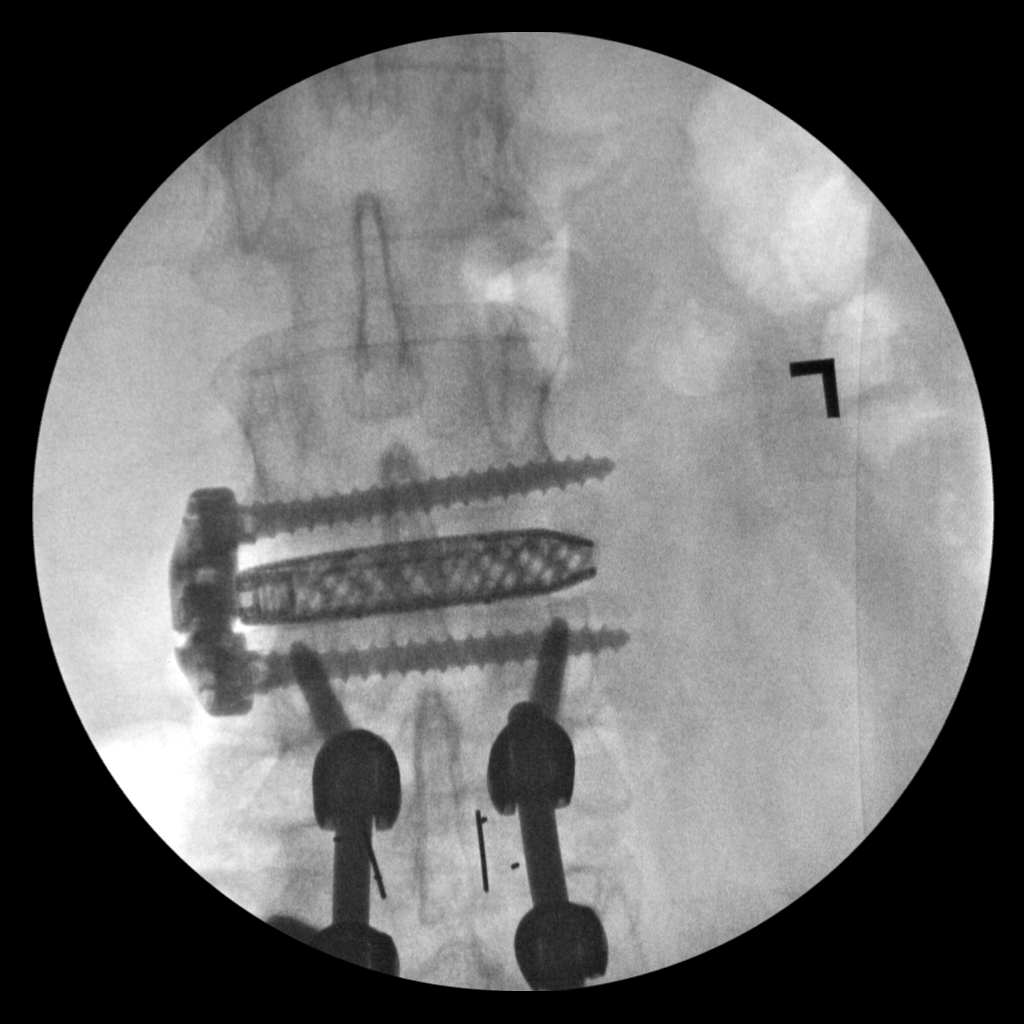
[im 3/3]
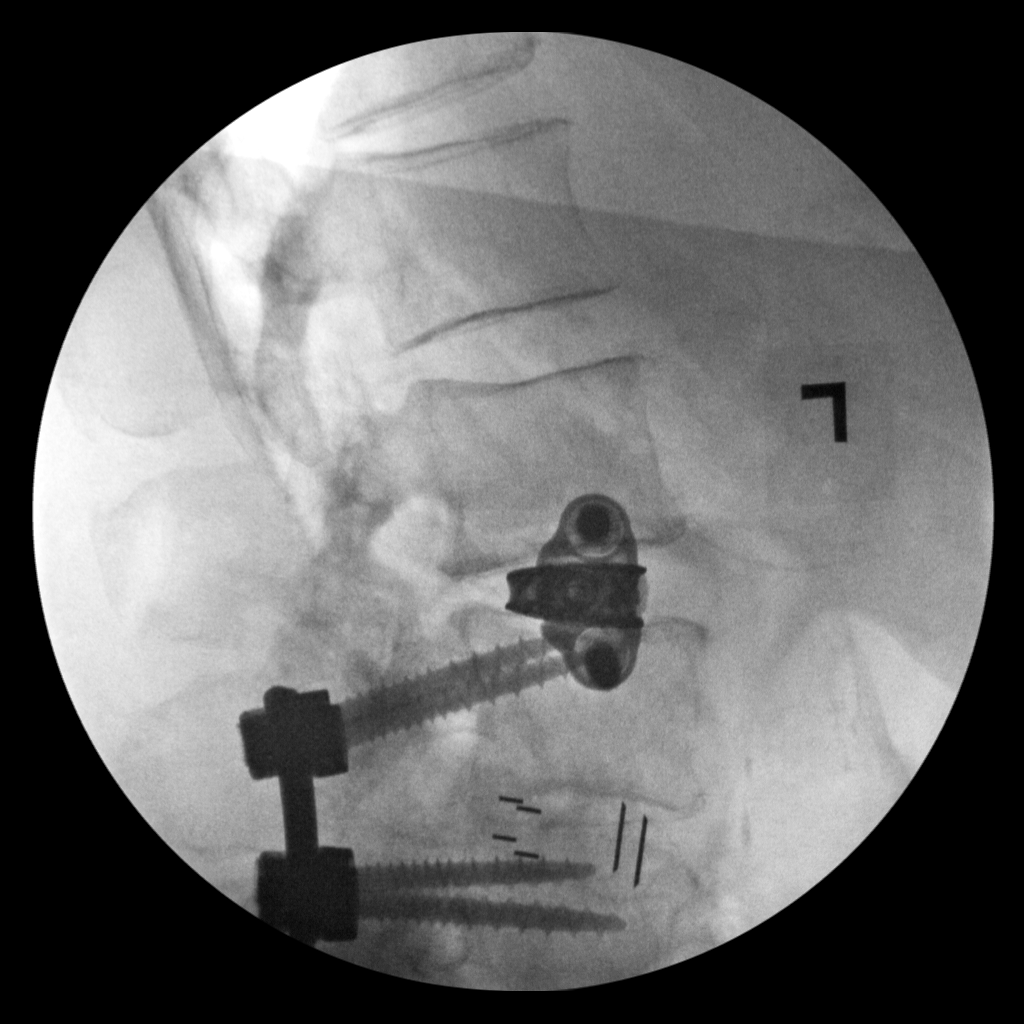

[3 of 3 positions shown; findings below may reference images not displayed]

FINDINGS: Three fluoroscopic intraoperative radiographs of L1-L3 and the
superior endplate of L4 are presented for interpretation. These
images demonstrate extreme lateral discectomy and fusion with
instrumentation of L2-3 with interbody bone cage, a right lateral
cortical plate and subcortical and plate screws. Pre-existing
hardware related to L3-4 fusion with instrumentation is partially
visualized. No unexpected radiopaque foreign body identified within
the visualized segment.

FLUOROSCOPY TIME:  Not provided, refer to operative report.
IMPRESSION: No unexpected retained radiopaque foreign body within the visualized
segment.

These results were called by telephone at the time of interpretation
on [DATE] at [DATE] to provider DARIA , who verbally
acknowledged these results.

## 2020-06-29 SURGERY — ANTERIOR LATERAL LUMBAR FUSION 1 LEVEL
Anesthesia: General | Site: Spine Lumbar | Laterality: Right

## 2020-06-29 MED ORDER — METHOCARBAMOL 1000 MG/10ML IJ SOLN
500.0000 mg | Freq: Four times a day (QID) | INTRAVENOUS | Status: DC | PRN
  Filled 2020-06-29: qty 5

## 2020-06-29 MED ORDER — LIDOCAINE 2% (20 MG/ML) 5 ML SYRINGE
INTRAMUSCULAR | Status: AC
  Filled 2020-06-29: qty 5

## 2020-06-29 MED ORDER — OXYCODONE HCL 5 MG PO TABS
5.0000 mg | ORAL_TABLET | ORAL | Status: DC | PRN
  Administered 2020-06-29 – 2020-06-30 (×3): 10 mg via ORAL
  Filled 2020-06-29 (×3): qty 2

## 2020-06-29 MED ORDER — THROMBIN 5000 UNITS EX SOLR
OROMUCOSAL | Status: DC | PRN
  Administered 2020-06-29: 5 mL

## 2020-06-29 MED ORDER — KCL IN DEXTROSE-NACL 20-5-0.45 MEQ/L-%-% IV SOLN: INTRAVENOUS | Status: DC

## 2020-06-29 MED ORDER — ORAL CARE MOUTH RINSE: 15.0000 mL | Freq: Once | OROMUCOSAL | Status: AC

## 2020-06-29 MED ORDER — ONDANSETRON HCL 4 MG/2ML IJ SOLN: 4.0000 mg | Freq: Four times a day (QID) | INTRAMUSCULAR | Status: DC | PRN

## 2020-06-29 MED ORDER — MIDAZOLAM HCL 2 MG/2ML IJ SOLN
INTRAMUSCULAR | Status: AC
  Filled 2020-06-29: qty 2

## 2020-06-29 MED ORDER — BUPIVACAINE HCL (PF) 0.5 % IJ SOLN
INTRAMUSCULAR | Status: DC | PRN
  Administered 2020-06-29: 5 mL

## 2020-06-29 MED ORDER — PANTOPRAZOLE SODIUM 40 MG IV SOLR: 40.0000 mg | Freq: Every day | INTRAVENOUS | Status: DC

## 2020-06-29 MED ORDER — SODIUM CHLORIDE 0.9% FLUSH: 3.0000 mL | INTRAVENOUS | Status: DC | PRN

## 2020-06-29 MED ORDER — FENTANYL CITRATE (PF) 250 MCG/5ML IJ SOLN
INTRAMUSCULAR | Status: AC
  Filled 2020-06-29: qty 5

## 2020-06-29 MED ORDER — 0.9 % SODIUM CHLORIDE (POUR BTL) OPTIME
TOPICAL | Status: DC | PRN
  Administered 2020-06-29: 1000 mL

## 2020-06-29 MED ORDER — ALUM & MAG HYDROXIDE-SIMETH 200-200-20 MG/5ML PO SUSP: 30.0000 mL | Freq: Four times a day (QID) | ORAL | Status: DC | PRN

## 2020-06-29 MED ORDER — LORATADINE 10 MG PO TABS: 10.0000 mg | ORAL_TABLET | Freq: Every day | ORAL | Status: DC

## 2020-06-29 MED ORDER — MUPIROCIN 2 % EX OINT
1.0000 "application " | TOPICAL_OINTMENT | Freq: Two times a day (BID) | CUTANEOUS | Status: DC
  Administered 2020-06-29 – 2020-06-30 (×2): 1 via NASAL
  Filled 2020-06-29: qty 22

## 2020-06-29 MED ORDER — PROPOFOL 10 MG/ML IV BOLUS
INTRAVENOUS | Status: AC
  Filled 2020-06-29: qty 20

## 2020-06-29 MED ORDER — ACETAMINOPHEN 500 MG PO TABS: 1000.0000 mg | ORAL_TABLET | Freq: Once | ORAL | Status: AC

## 2020-06-29 MED ORDER — CHLORHEXIDINE GLUCONATE CLOTH 2 % EX PADS: 6.0000 | MEDICATED_PAD | Freq: Once | CUTANEOUS | Status: DC

## 2020-06-29 MED ORDER — LACTATED RINGERS IV SOLN: INTRAVENOUS | Status: DC

## 2020-06-29 MED ORDER — DEXAMETHASONE SODIUM PHOSPHATE 10 MG/ML IJ SOLN
INTRAMUSCULAR | Status: AC
  Filled 2020-06-29: qty 1

## 2020-06-29 MED ORDER — CHLORHEXIDINE GLUCONATE CLOTH 2 % EX PADS
6.0000 | MEDICATED_PAD | Freq: Every day | CUTANEOUS | Status: DC
  Administered 2020-06-30: 6 via TOPICAL

## 2020-06-29 MED ORDER — ONDANSETRON HCL 4 MG/2ML IJ SOLN
INTRAMUSCULAR | Status: DC | PRN
  Administered 2020-06-29: 4 mg via INTRAVENOUS

## 2020-06-29 MED ORDER — LIDOCAINE-EPINEPHRINE 1 %-1:100000 IJ SOLN
INTRAMUSCULAR | Status: AC
  Filled 2020-06-29: qty 1

## 2020-06-29 MED ORDER — HYDROMORPHONE HCL 1 MG/ML IJ SOLN: 0.5000 mg | INTRAMUSCULAR | Status: DC | PRN

## 2020-06-29 MED ORDER — ONDANSETRON HCL 4 MG/2ML IJ SOLN
INTRAMUSCULAR | Status: AC
  Filled 2020-06-29: qty 2

## 2020-06-29 MED ORDER — ONDANSETRON HCL 4 MG PO TABS: 4.0000 mg | ORAL_TABLET | Freq: Four times a day (QID) | ORAL | Status: DC | PRN

## 2020-06-29 MED ORDER — MENTHOL 3 MG MT LOZG: 1.0000 | LOZENGE | OROMUCOSAL | Status: DC | PRN

## 2020-06-29 MED ORDER — FENTANYL CITRATE (PF) 250 MCG/5ML IJ SOLN
INTRAMUSCULAR | Status: DC | PRN
  Administered 2020-06-29 (×3): 50 ug via INTRAVENOUS

## 2020-06-29 MED ORDER — DOCUSATE SODIUM 100 MG PO CAPS
100.0000 mg | ORAL_CAPSULE | Freq: Two times a day (BID) | ORAL | Status: DC
  Administered 2020-06-29 – 2020-06-30 (×2): 100 mg via ORAL
  Filled 2020-06-29 (×2): qty 1

## 2020-06-29 MED ORDER — ZOLPIDEM TARTRATE 5 MG PO TABS
5.0000 mg | ORAL_TABLET | Freq: Every evening | ORAL | Status: DC | PRN
  Administered 2020-06-29: 5 mg via ORAL
  Filled 2020-06-29: qty 1

## 2020-06-29 MED ORDER — ACETAMINOPHEN 650 MG RE SUPP: 650.0000 mg | RECTAL | Status: DC | PRN

## 2020-06-29 MED ORDER — ACETAMINOPHEN 325 MG PO TABS
650.0000 mg | ORAL_TABLET | ORAL | Status: DC | PRN
  Administered 2020-06-30: 650 mg via ORAL
  Filled 2020-06-29: qty 2

## 2020-06-29 MED ORDER — LIDOCAINE 2% (20 MG/ML) 5 ML SYRINGE
INTRAMUSCULAR | Status: DC | PRN
  Administered 2020-06-29: 40 mg via INTRAVENOUS

## 2020-06-29 MED ORDER — PHENYLEPHRINE 40 MCG/ML (10ML) SYRINGE FOR IV PUSH (FOR BLOOD PRESSURE SUPPORT)
PREFILLED_SYRINGE | INTRAVENOUS | Status: DC | PRN
  Administered 2020-06-29 (×3): 80 ug via INTRAVENOUS

## 2020-06-29 MED ORDER — ACETAMINOPHEN 325 MG PO TABS: 650.0000 mg | ORAL_TABLET | Freq: Four times a day (QID) | ORAL | Status: DC | PRN

## 2020-06-29 MED ORDER — SODIUM CHLORIDE 0.9 % IV SOLN: 250.0000 mL | INTRAVENOUS | Status: DC

## 2020-06-29 MED ORDER — PANTOPRAZOLE SODIUM 40 MG PO TBEC
40.0000 mg | DELAYED_RELEASE_TABLET | Freq: Every day | ORAL | Status: DC
  Administered 2020-06-29: 40 mg via ORAL
  Filled 2020-06-29: qty 1

## 2020-06-29 MED ORDER — METHOCARBAMOL 500 MG PO TABS
500.0000 mg | ORAL_TABLET | Freq: Four times a day (QID) | ORAL | Status: DC | PRN
  Administered 2020-06-29 – 2020-06-30 (×2): 500 mg via ORAL
  Filled 2020-06-29 (×2): qty 1

## 2020-06-29 MED ORDER — BUPIVACAINE HCL (PF) 0.5 % IJ SOLN
INTRAMUSCULAR | Status: AC
  Filled 2020-06-29: qty 30

## 2020-06-29 MED ORDER — CEFAZOLIN SODIUM-DEXTROSE 2-4 GM/100ML-% IV SOLN
2.0000 g | Freq: Three times a day (TID) | INTRAVENOUS | Status: AC
  Administered 2020-06-29 – 2020-06-30 (×2): 2 g via INTRAVENOUS
  Filled 2020-06-29 (×2): qty 100

## 2020-06-29 MED ORDER — DILTIAZEM HCL ER COATED BEADS 120 MG PO CP24
120.0000 mg | ORAL_CAPSULE | Freq: Every day | ORAL | Status: DC
  Administered 2020-06-30: 120 mg via ORAL
  Filled 2020-06-29: qty 1

## 2020-06-29 MED ORDER — PHENYLEPHRINE HCL-NACL 10-0.9 MG/250ML-% IV SOLN
INTRAVENOUS | Status: DC | PRN
  Administered 2020-06-29: 25 ug/min via INTRAVENOUS

## 2020-06-29 MED ORDER — SUCCINYLCHOLINE CHLORIDE 20 MG/ML IJ SOLN
INTRAMUSCULAR | Status: DC | PRN
  Administered 2020-06-29: 90 mg via INTRAVENOUS

## 2020-06-29 MED ORDER — THROMBIN 5000 UNITS EX SOLR
CUTANEOUS | Status: AC
  Filled 2020-06-29: qty 5000

## 2020-06-29 MED ORDER — ACETAMINOPHEN 500 MG PO TABS
ORAL_TABLET | ORAL | Status: AC
  Administered 2020-06-29: 1000 mg via ORAL
  Filled 2020-06-29: qty 2

## 2020-06-29 MED ORDER — DEXAMETHASONE SODIUM PHOSPHATE 10 MG/ML IJ SOLN: INTRAMUSCULAR | Status: DC | PRN

## 2020-06-29 MED ORDER — CEFAZOLIN SODIUM-DEXTROSE 2-4 GM/100ML-% IV SOLN
2.0000 g | INTRAVENOUS | Status: AC
  Administered 2020-06-29: 2 g via INTRAVENOUS
  Filled 2020-06-29: qty 100

## 2020-06-29 MED ORDER — LACTATED RINGERS IV SOLN: INTRAVENOUS | Status: DC | PRN

## 2020-06-29 MED ORDER — FLUTICASONE PROPIONATE 50 MCG/ACT NA SUSP: 1.0000 | Freq: Every day | NASAL | Status: DC | PRN

## 2020-06-29 MED ORDER — HYDROCODONE-ACETAMINOPHEN 5-325 MG PO TABS
2.0000 | ORAL_TABLET | ORAL | Status: DC | PRN
  Administered 2020-06-29: 2 via ORAL
  Filled 2020-06-29: qty 2

## 2020-06-29 MED ORDER — VANCOMYCIN HCL IN DEXTROSE 1-5 GM/200ML-% IV SOLN
1000.0000 mg | Freq: Once | INTRAVENOUS | Status: AC
  Administered 2020-06-29: 1000 mg via INTRAVENOUS
  Filled 2020-06-29: qty 200

## 2020-06-29 MED ORDER — PHENOL 1.4 % MT LIQD: 1.0000 | OROMUCOSAL | Status: DC | PRN

## 2020-06-29 MED ORDER — SODIUM CHLORIDE 0.9% FLUSH
3.0000 mL | Freq: Two times a day (BID) | INTRAVENOUS | Status: DC
  Administered 2020-06-29: 3 mL via INTRAVENOUS

## 2020-06-29 MED ORDER — PROPOFOL 10 MG/ML IV BOLUS
INTRAVENOUS | Status: DC | PRN
  Administered 2020-06-29: 20 mg via INTRAVENOUS
  Administered 2020-06-29: 150 mg via INTRAVENOUS

## 2020-06-29 MED ORDER — PHENYLEPHRINE 40 MCG/ML (10ML) SYRINGE FOR IV PUSH (FOR BLOOD PRESSURE SUPPORT)
PREFILLED_SYRINGE | INTRAVENOUS | Status: AC
  Filled 2020-06-29: qty 10

## 2020-06-29 MED ORDER — MIDAZOLAM HCL 5 MG/5ML IJ SOLN
INTRAMUSCULAR | Status: DC | PRN
  Administered 2020-06-29: 1 mg via INTRAVENOUS

## 2020-06-29 MED ORDER — CHLORHEXIDINE GLUCONATE 0.12 % MT SOLN
15.0000 mL | Freq: Once | OROMUCOSAL | Status: AC
  Administered 2020-06-29: 15 mL via OROMUCOSAL
  Filled 2020-06-29: qty 15

## 2020-06-29 MED ORDER — POLYETHYLENE GLYCOL 3350 17 G PO PACK: 17.0000 g | PACK | Freq: Every day | ORAL | Status: DC | PRN

## 2020-06-29 MED ORDER — LIDOCAINE-EPINEPHRINE 1 %-1:100000 IJ SOLN
INTRAMUSCULAR | Status: DC | PRN
  Administered 2020-06-29: 5 mL

## 2020-06-29 MED ORDER — BISACODYL 10 MG RE SUPP: 10.0000 mg | Freq: Every day | RECTAL | Status: DC | PRN

## 2020-06-29 MED ORDER — PROPOFOL 500 MG/50ML IV EMUL
INTRAVENOUS | Status: DC | PRN
  Administered 2020-06-29: 50 ug/kg/min via INTRAVENOUS

## 2020-06-29 MED ORDER — OXYCODONE HCL 5 MG PO TABS: 5.0000 mg | ORAL_TABLET | ORAL | Status: DC | PRN

## 2020-06-29 MED ORDER — TURMERIC 450 MG PO CAPS: ORAL_CAPSULE | Freq: Every day | ORAL | Status: DC

## 2020-06-29 MED ORDER — SIMVASTATIN 20 MG PO TABS: 40.0000 mg | ORAL_TABLET | Freq: Every day | ORAL | Status: DC

## 2020-06-29 MED ORDER — SUCCINYLCHOLINE CHLORIDE 200 MG/10ML IV SOSY
PREFILLED_SYRINGE | INTRAVENOUS | Status: AC
  Filled 2020-06-29: qty 10

## 2020-06-29 MED ORDER — DEXAMETHASONE SODIUM PHOSPHATE 10 MG/ML IJ SOLN
INTRAMUSCULAR | Status: DC | PRN
  Administered 2020-06-29: 4 mg via INTRAVENOUS

## 2020-06-29 MED ORDER — ADULT MULTIVITAMIN W/MINERALS CH
1.0000 | ORAL_TABLET | Freq: Every day | ORAL | Status: DC
  Administered 2020-06-30: 1 via ORAL
  Filled 2020-06-29: qty 1

## 2020-06-29 MED ORDER — FLEET ENEMA 7-19 GM/118ML RE ENEM: 1.0000 | ENEMA | Freq: Once | RECTAL | Status: DC | PRN

## 2020-06-29 SURGICAL SUPPLY — 55 items
ADH SKN CLS APL DERMABOND .7 (GAUZE/BANDAGES/DRESSINGS) ×1
BLADE CLIPPER SURG (BLADE) IMPLANT
BOLT PLATE XLIF 5.5X55 LRG (Bolt) ×2 IMPLANT
CARTRIDGE OIL MAESTRO DRILL (MISCELLANEOUS) ×1 IMPLANT
COVER WAND RF STERILE (DRAPES) ×2 IMPLANT
DECANTER SPIKE VIAL GLASS SM (MISCELLANEOUS) ×2 IMPLANT
DERMABOND ADVANCED (GAUZE/BANDAGES/DRESSINGS) ×1
DERMABOND ADVANCED .7 DNX12 (GAUZE/BANDAGES/DRESSINGS) ×2 IMPLANT
DIFFUSER DRILL AIR PNEUMATIC (MISCELLANEOUS) ×2 IMPLANT
DRAPE C-ARM 42X72 X-RAY (DRAPES) ×2 IMPLANT
DRAPE C-ARMOR (DRAPES) ×2 IMPLANT
DRAPE LAPAROTOMY 100X72X124 (DRAPES) ×2 IMPLANT
DRSG OPSITE POSTOP 4X6 (GAUZE/BANDAGES/DRESSINGS) ×1 IMPLANT
DURAPREP 26ML APPLICATOR (WOUND CARE) ×2 IMPLANT
ELECT REM PT RETURN 9FT ADLT (ELECTROSURGICAL) ×2
ELECTRODE REM PT RTRN 9FT ADLT (ELECTROSURGICAL) ×1 IMPLANT
GAUZE 4X4 16PLY RFD (DISPOSABLE) IMPLANT
GLOVE BIO SURGEON STRL SZ8 (GLOVE) ×2 IMPLANT
GLOVE BIOGEL PI IND STRL 8 (GLOVE) ×1 IMPLANT
GLOVE BIOGEL PI IND STRL 8.5 (GLOVE) ×1 IMPLANT
GLOVE BIOGEL PI INDICATOR 8 (GLOVE) ×1
GLOVE BIOGEL PI INDICATOR 8.5 (GLOVE) ×1
GLOVE ECLIPSE 8.0 STRL XLNG CF (GLOVE) ×2 IMPLANT
GLOVE EXAM NITRILE XL STR (GLOVE) IMPLANT
GOWN STRL REUS W/ TWL LRG LVL3 (GOWN DISPOSABLE) IMPLANT
GOWN STRL REUS W/ TWL XL LVL3 (GOWN DISPOSABLE) ×2 IMPLANT
GOWN STRL REUS W/TWL 2XL LVL3 (GOWN DISPOSABLE) IMPLANT
GOWN STRL REUS W/TWL LRG LVL3 (GOWN DISPOSABLE)
GOWN STRL REUS W/TWL XL LVL3 (GOWN DISPOSABLE) ×4
KIT BASIN OR (CUSTOM PROCEDURE TRAY) ×2 IMPLANT
KIT DILATOR XLIF 5 (KITS) IMPLANT
KIT INFUSE XX SMALL 0.7CC (Orthopedic Implant) ×1 IMPLANT
KIT SURGICAL ACCESS MAXCESS 4 (KITS) ×1 IMPLANT
KIT TURNOVER KIT B (KITS) ×2 IMPLANT
KIT XLIF (KITS) ×1
MODULE NVM5 NEXT GEN EMG (NEEDLE) ×1 IMPLANT
MODULUS XLW 10X22X50MM 10DEG (Spine Construct) ×1 IMPLANT
NDL HYPO 25X1 1.5 SAFETY (NEEDLE) ×1 IMPLANT
NEEDLE HYPO 25X1 1.5 SAFETY (NEEDLE) ×2 IMPLANT
NS IRRIG 1000ML POUR BTL (IV SOLUTION) ×2 IMPLANT
OIL CARTRIDGE MAESTRO DRILL (MISCELLANEOUS) ×2
PACK LAMINECTOMY NEURO (CUSTOM PROCEDURE TRAY) ×2 IMPLANT
PLATE 2H 10MM (Plate) ×1 IMPLANT
PUTTY BONE ATTRAX 10CC STRIP (Putty) ×1 IMPLANT
SPONGE LAP 4X18 RFD (DISPOSABLE) IMPLANT
SPONGE SURGIFOAM ABS GEL SZ50 (HEMOSTASIS) IMPLANT
STAPLER SKIN PROX WIDE 3.9 (STAPLE) ×2 IMPLANT
SUT VIC AB 1 CT1 18XBRD ANBCTR (SUTURE) ×1 IMPLANT
SUT VIC AB 1 CT1 8-18 (SUTURE) ×2
SUT VIC AB 2-0 CT1 18 (SUTURE) ×2 IMPLANT
SUT VIC AB 3-0 SH 8-18 (SUTURE) ×2 IMPLANT
TOWEL GREEN STERILE (TOWEL DISPOSABLE) IMPLANT
TOWEL GREEN STERILE FF (TOWEL DISPOSABLE) IMPLANT
TRAY FOLEY MTR SLVR 16FR STAT (SET/KITS/TRAYS/PACK) ×2 IMPLANT
WATER STERILE IRR 1000ML POUR (IV SOLUTION) ×2 IMPLANT

## 2020-06-29 NOTE — Anesthesia Procedure Notes (Signed)
Procedure Name: Intubation Date/Time: 06/29/2020 11:05 AM Performed by: Colin Benton, CRNA Pre-anesthesia Checklist: Patient identified, Emergency Drugs available, Suction available and Patient being monitored Patient Re-evaluated:Patient Re-evaluated prior to induction Oxygen Delivery Method: Circle system utilized Preoxygenation: Pre-oxygenation with 100% oxygen Induction Type: IV induction Ventilation: Mask ventilation without difficulty Laryngoscope Size: Miller and 2 Grade View: Grade II Tube type: Oral Tube size: 7.5 mm Number of attempts: 1 Airway Equipment and Method: Stylet Placement Confirmation: ETT inserted through vocal cords under direct vision,  positive ETCO2 and breath sounds checked- equal and bilateral Secured at: 22 cm Tube secured with: Tape Dental Injury: Teeth and Oropharynx as per pre-operative assessment

## 2020-06-29 NOTE — Progress Notes (Signed)
Awake, alert, conversant.  MAEW with good strength.  No numbness.  Doing well.

## 2020-06-29 NOTE — Interval H&P Note (Signed)
History and Physical Interval Note:  06/29/2020 10:52 AM  Cody Mcconnell  has presented today for surgery, with the diagnosis of Radiculopathy, Lumbar region.  The various methods of treatment have been discussed with the patient and family. After consideration of risks, benefits and other options for treatment, the patient has consented to  Procedure(s) with comments: Right Lumbar 2-3 Anterolateral lumbar interbody fusion with lateral plate (Right) - 3C as a surgical intervention.  The patient's history has been reviewed, patient examined, no change in status, stable for surgery.  I have reviewed the patient's chart and labs.  Questions were answered to the patient's satisfaction.     Peggyann Shoals

## 2020-06-29 NOTE — Transfer of Care (Signed)
Immediate Anesthesia Transfer of Care Note  Patient: Cody Mcconnell  Procedure(s) Performed: Right Lumbar Two-Three Anterolateral lumbar interbody fusion with lateral plate (Right Spine Lumbar)  Patient Location: PACU  Anesthesia Type:General  Level of Consciousness: drowsy  Airway & Oxygen Therapy: Patient Spontanous Breathing and Patient connected to nasal cannula oxygen  Post-op Assessment: Report given to RN and Post -op Vital signs reviewed and stable  Post vital signs: Reviewed and stable  Last Vitals:  Vitals Value Taken Time  BP 136/78 06/29/20 1304  Temp    Pulse 70 06/29/20 1305  Resp 18 06/29/20 1305  SpO2 99 % 06/29/20 1305  Vitals shown include unvalidated device data.  Last Pain:  Vitals:   06/29/20 0938  TempSrc:   PainSc: 3       Patients Stated Pain Goal: 2 (79/39/68 [id number])  Complications: No complications documented.

## 2020-06-29 NOTE — Progress Notes (Signed)
Orthopedic Tech Progress Note Patient Details:  MORTIMER BAIR [birth year] [id number] Ordered Outside Vendor Brace Patient ID: [name], male   DOB: [birth year], 74 y.o.   MRN: [id number]   Tammy Sours 06/29/2020, 1:51 PM

## 2020-06-29 NOTE — Brief Op Note (Signed)
06/29/2020  12:59 PM  PATIENT:  Cody Mcconnell  74 y.o. male  PRE-OPERATIVE DIAGNOSIS:  Lumbar spondylolisthesis, stenosis, HNP, Radiculopathy, Lumbar region, lumbago L 23 level  POST-OPERATIVE DIAGNOSIS: Lumbar spondylolisthesis, stenosis, HNP, Radiculopathy, Lumbar region, lumbago L 23 level  PROCEDURE:  Procedure(s) with comments: Right Lumbar Two-Three Anterolateral lumbar interbody fusion with lateral plate (Right) - Right Lumbar Two-Three Anterolateral lumbar interbody fusion with lateral plate   SURGEON:  Surgeon(s) and Role:    Erline Levine, MD - Primary  PHYSICIAN ASSISTANT:McDaniel, NP   ASSISTANTS: Poteat, RN   ANESTHESIA:   general  EBL:  25 mL   BLOOD ADMINISTERED:none  DRAINS: none   LOCAL MEDICATIONS USED:  MARCAINE    and LIDOCAINE   SPECIMEN:  No Specimen  DISPOSITION OF SPECIMEN:  N/A  COUNTS:  YES  TOURNIQUET:  * No tourniquets in log *  DICTATION: Patient is a 74 year old with severe spondylosis stenosis, HNP, of the lumbar spine with spodylolisthesis. It was elected to take him to surgery for anterolateral decompression and lateral plate fixation at the L 23 level.    Procedure: Patient was brought to the operating room and placed in a left lateral decubitus position on the operative table and using orthogonally projected C-arm fluoroscopy the patient was placed so that the L  23  level was visualized in AP and lateral plane. The patient was then taped into position. The table was flexed so as to expose the L 23 level. Skin was marked along with a posterior finger dissection incision. His flank was then prepped and draped in usual sterile fashion and incisions were made between the 11 th and 12 th ribs. Posterior finger dissection was made to enter the retroperitoneal space and then subsequently the probe was inserted into the psoas muscle from the right side initially at the L 23 level. After mapping the neural elements were able to dock the probe  per the midpoint of this vertebral level and without indications electrically of too close proximity to the neural tissues. Subsequently the self-retaining tractor was.after sequential dilators were utilized the shim was employed and the interspace was cleared of psoas muscle and then incised. A thorough discectomy was performed. Instruments were used to clear the interspace of disc material. After thorough discectomy was performed and this was performed using AP and lateral fluoroscopy a 10 lordotic by 50 x 22 mm implant was packed with extra extra small BMP and Attrax. This was tamped into position and its position was confirmed on AP and lateral fluoroscopy. A Decade lateral 2 hole plate (size 10) was affixed to the lateral spine with 5.5 x 55 mm screws, one at each level. All screws were torque locked and positioning was confirmed with AP and lateral fluoroscopy. . Hemostasis was assured the wounds were irrigated interrupted Vicryl sutures.Sterile occlusive dressing was placed with Dermabond and occlusive dressing. The patient was then extubated in the operating room and taken to recovery in stable and satisfactory condition having tolerated his operation well. Counts were correct at the end of the case.  PLAN OF CARE: Admit to inpatient   PATIENT DISPOSITION:  PACU - hemodynamically stable.   Delay start of Pharmacological VTE agent (>24hrs) due to surgical blood loss or risk of bleeding: yes

## 2020-06-29 NOTE — Op Note (Signed)
06/29/2020  12:59 PM  PATIENT:  Cody Mcconnell  74 y.o. male  PRE-OPERATIVE DIAGNOSIS:  Lumbar spondylolisthesis, stenosis, HNP, Radiculopathy, Lumbar region, lumbago L 23 level  POST-OPERATIVE DIAGNOSIS: Lumbar spondylolisthesis, stenosis, HNP, Radiculopathy, Lumbar region, lumbago L 23 level  PROCEDURE:  Procedure(s) with comments: Right Lumbar Two-Three Anterolateral lumbar interbody fusion with lateral plate (Right) - Right Lumbar Two-Three Anterolateral lumbar interbody fusion with lateral plate   SURGEON:  Surgeon(s) and Role:    Erline Levine, MD - Primary  PHYSICIAN ASSISTANT:McDaniel, NP   ASSISTANTS: Poteat, RN   ANESTHESIA:   general  EBL:  25 mL   BLOOD ADMINISTERED:none  DRAINS: none   LOCAL MEDICATIONS USED:  MARCAINE    and LIDOCAINE   SPECIMEN:  No Specimen  DISPOSITION OF SPECIMEN:  N/A  COUNTS:  YES  TOURNIQUET:  * No tourniquets in log *  DICTATION: Patient is a 74 year old with severe spondylosis stenosis, HNP, of the lumbar spine with spodylolisthesis. It was elected to take him to surgery for anterolateral decompression and lateral plate fixation at the L 23 level.    Procedure: Patient was brought to the operating room and placed in a left lateral decubitus position on the operative table and using orthogonally projected C-arm fluoroscopy the patient was placed so that the L  23  level was visualized in AP and lateral plane. The patient was then taped into position. The table was flexed so as to expose the L 23 level. Skin was marked along with a posterior finger dissection incision. His flank was then prepped and draped in usual sterile fashion and incisions were made between the 11 th and 12 th ribs. Posterior finger dissection was made to enter the retroperitoneal space and then subsequently the probe was inserted into the psoas muscle from the right side initially at the L 23 level. After mapping the neural elements were able to dock the probe  per the midpoint of this vertebral level and without indications electrically of too close proximity to the neural tissues. Subsequently the self-retaining tractor was.after sequential dilators were utilized the shim was employed and the interspace was cleared of psoas muscle and then incised. A thorough discectomy was performed. Instruments were used to clear the interspace of disc material. After thorough discectomy was performed and this was performed using AP and lateral fluoroscopy a 10 lordotic by 50 x 22 mm implant was packed with extra extra small BMP and Attrax. This was tamped into position and its position was confirmed on AP and lateral fluoroscopy. A Decade lateral 2 hole plate (size 10) was affixed to the lateral spine with 5.5 x 55 mm screws, one at each level. All screws were torque locked and positioning was confirmed with AP and lateral fluoroscopy. . Hemostasis was assured the wounds were irrigated interrupted Vicryl sutures.Sterile occlusive dressing was placed with Dermabond and occlusive dressing. The patient was then extubated in the operating room and taken to recovery in stable and satisfactory condition having tolerated his operation well. Counts were correct at the end of the case.  PLAN OF CARE: Admit to inpatient   PATIENT DISPOSITION:  PACU - hemodynamically stable.   Delay start of Pharmacological VTE agent (>24hrs) due to surgical blood loss or risk of bleeding: yes

## 2020-06-30 ENCOUNTER — Encounter (HOSPITAL_COMMUNITY): Payer: Self-pay | Admitting: Neurosurgery

## 2020-06-30 DIAGNOSIS — Z7982 Long term (current) use of aspirin: Secondary | ICD-10-CM | POA: Diagnosis not present

## 2020-06-30 DIAGNOSIS — M4316 Spondylolisthesis, lumbar region: Secondary | ICD-10-CM | POA: Diagnosis not present

## 2020-06-30 DIAGNOSIS — Z87891 Personal history of nicotine dependence: Secondary | ICD-10-CM | POA: Diagnosis not present

## 2020-06-30 DIAGNOSIS — M48062 Spinal stenosis, lumbar region with neurogenic claudication: Secondary | ICD-10-CM | POA: Diagnosis not present

## 2020-06-30 DIAGNOSIS — M5116 Intervertebral disc disorders with radiculopathy, lumbar region: Secondary | ICD-10-CM | POA: Diagnosis not present

## 2020-06-30 MED FILL — Heparin Sodium (Porcine) Inj 1000 Unit/ML: INTRAMUSCULAR | Qty: 30 | Status: AC

## 2020-06-30 MED FILL — Sodium Chloride IV Soln 0.9%: INTRAVENOUS | Qty: 1000 | Status: AC

## 2020-06-30 NOTE — Progress Notes (Signed)
Subjective: Patient reports that he is doing well and is ambulating well. No acute events reported overnight. He reports minimal incisional discomfort and persistent right focal groin pain.  Objective: Vital signs in last 24 hours: Temp:  [97.2 F (36.2 C)-98.3 F (36.8 C)] 97.9 F (36.6 C) (10/22 1131) Pulse Rate:  [49-78] 67 (10/22 1131) Resp:  [12-20] 16 (10/22 1131) BP: (127-148)/(68-82) 148/72 (10/22 1131) SpO2:  [96 %-100 %] 100 % (10/22 1131)  Intake/Output from previous day: 10/21 0701 - 10/22 0700 In: 1740 [P.O.:240; I.V.:1400; IV Piggyback:100] Out: 625 [Urine:600; Blood:25] Intake/Output this shift: No intake/output data recorded.  Physical Exam: He is A/O X4, conversant, and in good spirits. Reports minimal incisional discomfort and persistent right focal groin pain. MAEW with good strength. Dressing is CDI. Incision is well approximated with no erythema, edema, or drainage.   Lab Results: No results for input(s): WBC, HGB, HCT, PLT in the last 72 hours. BMET No results for input(s): NA, K, CL, CO2, GLUCOSE, BUN, CREATININE, CALCIUM in the last 72 hours.  Studies/Results: DG Lumbar Spine 2-3 Views  Result Date: 06/29/2020 CLINICAL DATA:  L2-3 fusion with instrumentation. Inaccurate needle count. EXAM: LUMBAR SPINE - 2-3 VIEW COMPARISON:  05/23/2020 FINDINGS: Three fluoroscopic intraoperative radiographs of L1-L3 and the superior endplate of L4 are presented for interpretation. These images demonstrate extreme lateral discectomy and fusion with instrumentation of L2-3 with interbody bone cage, a right lateral cortical plate and subcortical and plate screws. Pre-existing hardware related to L3-4 fusion with instrumentation is partially visualized. No unexpected radiopaque foreign body identified within the visualized segment. FLUOROSCOPY TIME:  Not provided, refer to operative report. IMPRESSION: No unexpected retained radiopaque foreign body within the visualized segment.  These results were called by telephone at the time of interpretation on 06/29/2020 at 12:47 pm to provider [facility] , who verbally acknowledged these results. Electronically Signed   By: Fidela Salisbury MD   On: 06/29/2020 12:49   DG C-Arm 1-60 Min  Result Date: 06/29/2020 CLINICAL DATA:  L2-3 fusion with instrumentation. Inaccurate needle count. EXAM: DG C-ARM 1-60 MIN CONTRAST:  None FLUOROSCOPY TIME:  Fluoroscopy Time: Not provided, refer to operative report Radiation Exposure Index (if provided by the fluoroscopic device): Not provided, refer to operative report Number of Acquired Spot Images: 3 COMPARISON:  None. FINDINGS: Three fluoroscopic intraoperative radiographs of L1-L3 and the superior endplate of L4 are presented for interpretation. These images demonstrate extreme lateral discectomy and fusion with instrumentation of L2-3 with interbody bone cage, a right lateral cortical plate and subcortical and plate screws. Pre-existing hardware related to L3-4 fusion with instrumentation is partially visualized. No unexpected radiopaque foreign body identified within the visualized segment. IMPRESSION: No unexpected retained radiopaque foreign body within the visualized segment. These results were called by telephone at the time of interpretation on 06/29/2020 at 12:47 pm to provider [facility] , who verbally acknowledged these results. Electronically Signed   By: Fidela Salisbury MD   On: 06/29/2020 13:01    Assessment/Plan: Patient is postop day 1 s/p right L2-L3 XLIF with lateral plate. The patient is recovering well from his surgery and is experiencing expected incisional discomfort.  He continues to have complaints of his preoperative right focal groin pain but stated that it has begun to improve.  LSO brace when OB.  Continue working on mobility and ambulation.  Pain is well controlled on p.o. Pain medications.  He is stable, in no apparent distress and ambulating well. He feels that he is  ready  for discharge.  Patient will follow-up in the in 3 weeks.   LOS: 1 day    Marvis Moeller, DNP, NP-C 06/30/2020, 11:57 AM

## 2020-06-30 NOTE — Progress Notes (Signed)
Pt doing well. Pt and spouse given D/C instructions with Rx, verbal understanding was provided. Pt's IV was removed prior to D/C. Pt's incision is open to air and has no sign of infection. Pt D/C'd home via wheelchair. Pt is stable @ D/C and has no other needs at this time.

## 2020-06-30 NOTE — Care Management Obs Status (Addendum)
[place] NOTIFICATION   Patient Details  Name: KIEREN ADKISON MRN: [id number] Date of Birth: 22-May-1946   Medicare Observation Status Notification Given:  Yes    Verdell Carmine, RN 06/30/2020, 2:18 PM

## 2020-06-30 NOTE — Evaluation (Signed)
Physical Therapy Evaluation & Discharge Patient Details Name: Cody Mcconnell MRN: [id number] DOB: [birth year] Today's Date: 06/30/2020   History of Present Illness  Pt is a 74 y.o. male s/p ALIF L2-3 on 06/29/20. PMH includes PAF, OA, HOH, arthritis, cervical sx.  Clinical Impression  Patient evaluated by Physical Therapy with no further acute PT needs identified. PTA, pt independent, active, enjoys traveling and lives with wife. Today, pt mobilizing well at supervision-level. Educ re: precautions, brace wear/application, positioning, therex, activity recommendations and importance of mobility. All education has been completed and the patient has no further questions. Acute PT is signing off. Thank you for this referral.    Follow Up Recommendations No PT follow up;Supervision - Intermittent    Equipment Recommendations  None recommended by PT    Recommendations for Other Services       Precautions / Restrictions Precautions Precautions: Back Precaution Booklet Issued: Yes (comment) Precaution Comments: Pt able to recall 2/3 precautions at beginning of session; 3/3 by end of session Required Braces or Orthoses: Spinal Brace Spinal Brace: Lumbar corset;Applied in sitting position Restrictions Weight Bearing Restrictions: No      Mobility  Bed Mobility Overal bed mobility: Modified Independent             General bed mobility comments: Good technique with log roll    Transfers Overall transfer level: Independent Equipment used: None             General transfer comment: safety cues  Ambulation/Gait Ambulation/Gait assistance: Supervision Gait Distance (Feet): 400 Feet Assistive device: None Gait Pattern/deviations: Step-through pattern;Decreased stride length;Drifts right/left Gait velocity: Decreased Gait velocity interpretation: 1.31 - 2.62 ft/sec, indicative of limited community ambulator General Gait Details: Slow, mostly steady gait without DME,  supervision for safety, at times drifting towards L-side, but no LOB; reports h/o R groin pain  Stairs Stairs: Yes Stairs assistance: Supervision Stair Management: One rail Left;Alternating pattern;Forwards Number of Stairs: 11 General stair comments: Supervision for safety  Wheelchair Mobility    Modified Rankin (Stroke Patients Only)       Balance Overall balance assessment: Mild deficits observed, not formally tested                                           Pertinent Vitals/Pain Pain Assessment: Faces Faces Pain Scale: Hurts a little bit Pain Location: Lumbar incision Pain Descriptors / Indicators: Sore;Tender Pain Intervention(s): Monitored during session    Home Living Family/patient expects to be discharged to:: Private residence Living Arrangements: Spouse/significant other Available Help at Discharge: Family Type of Home: House Home Access: Stairs to enter Entrance Stairs-Rails: Left Entrance Stairs-Number of Steps: 3 Home Layout: Multi-level Home Equipment: None      Prior Function Level of Independence: Independent         Comments: Active, owns multiple properties, enjoys riding horses; endorses falls on stairs     Hand Dominance        Extremity/Trunk Assessment   Upper Extremity Assessment Upper Extremity Assessment: Overall WFL for tasks assessed    Lower Extremity Assessment Lower Extremity Assessment: Overall WFL for tasks assessed;RLE deficits/detail RLE Deficits / Details: Funtionally >3/5, reports h/o R groin (iliopsoas) pain    Cervical / Trunk Assessment Cervical / Trunk Assessment: Other exceptions Cervical / Trunk Exceptions: s/p L2-3 ALIF  Communication   Communication: No difficulties  Cognition Arousal/Alertness: Awake/alert Behavior During Therapy: [facility]  for tasks assessed/performed;Impulsive Overall Cognitive Status: No family/caregiver present to determine baseline cognitive functioning                                  General Comments: WFL for majority of tasks. Pt talkative requiring frequent redirection to focus on task at hand, but very agreeable to this      General Comments General comments (skin integrity, edema, etc.): Reviewed brace application and practiced 3x - cues to focus on task as pt easily distracted with his conversation    Exercises     Assessment/Plan    PT Assessment Patent does not need any further PT services  PT Problem List         PT Treatment Interventions      PT Goals (Current goals can be found in the Care Plan section)  Acute Rehab PT Goals Patient Stated Goal: Reduce pain and start to travel.  Play tennis. PT Goal Formulation: All assessment and education complete, DC therapy    Frequency     Barriers to discharge        Co-evaluation               AM-PAC PT "6 Clicks" Mobility  Outcome Measure Help needed turning from your back to your side while in a flat bed without using bedrails?: None Help needed moving from lying on your back to sitting on the side of a flat bed without using bedrails?: None Help needed moving to and from a bed to a chair (including a wheelchair)?: None Help needed standing up from a chair using your arms (e.g., wheelchair or bedside chair)?: None Help needed to walk in hospital room?: None Help needed climbing 3-5 steps with a railing? : None 6 Click Score: 24    End of Session Equipment Utilized During Treatment: Back brace Activity Tolerance: Patient tolerated treatment well Patient left: in bed;with call bell/phone within reach Nurse Communication: Mobility status PT Visit Diagnosis: Other abnormalities of gait and mobility (R26.89)    Time: 0569-7948 PT Time Calculation (min) (ACUTE ONLY): 15 min   Charges:   PT Evaluation $PT Eval Low Complexity: [place], PT, DPT Acute Rehabilitation Services  Pager [phone] Office [place] 06/30/2020, 10:29 AM

## 2020-06-30 NOTE — Care Management CC44 (Addendum)
Condition Code 44 Documentation Completed  Patient Details  Name: Cody Mcconnell MRN: [id number] Date of Birth: [birth year]   Condition Code 44 given:  Yes Patient signature on Condition Code 44 notice:  Yes Documentation of 2 MD's agreement:  Yes Code 44 added to claim:  Yes Patient gave this care manager permission to sign for him as this was done over the phone. He did not want a copy of this notice.    Verdell Carmine, RN 06/30/2020, 2:18 PM

## 2020-06-30 NOTE — Evaluation (Signed)
Occupational Therapy Evaluation Patient Details Name: Cody Mcconnell MRN: [id number] DOB: 1946/01/19 Today's Date: 06/30/2020    History of Present Illness Pt is a 74 y.o. male s/p ALIF L2-3 on 06/29/20. PMH includes PAF, OA, HOH, arthritis, cervical sx.   Clinical Impression   Patient admitted for the above diagnosis and procedure.  He presents with expected discomfort to this back, and is impulsive.  But, essentially he is at or near his baseline.  Plan is to return home with assist as needed from his spouse.  Patient is very active and independent prior level, managing multiple businesses.  Recently, a few weeks prior to surgery, pain was limiting his independence.  No further OT in the acute setting.      Follow Up Recommendations  No OT follow up    Equipment Recommendations  Tub/shower bench    Recommendations for Other Services  PT eval     Precautions / Restrictions Precautions Precautions: Back Precaution Booklet Issued: Yes (comment) Required Braces or Orthoses: Spinal Brace Spinal Brace: Lumbar corset Restrictions Weight Bearing Restrictions: No      Mobility Bed Mobility Overal bed mobility: Modified Independent             General bed mobility comments: cues for log roll    Transfers Overall transfer level: Modified independent               General transfer comment: safety cues    Balance Overall balance assessment: No apparent balance deficits (not formally assessed)                                         ADL either performed or assessed with clinical judgement   ADL Overall ADL's : Modified independent                                       General ADL Comments: Close to baseline.  Impulsive with safety cues.     Vision Baseline Vision/History: Wears glasses Wears Glasses: At all times       Perception     Praxis      Pertinent Vitals/Pain Pain Assessment: Faces Faces Pain Scale: Hurts  a little bit Pain Descriptors / Indicators: Sore;Tender Pain Intervention(s): Monitored during session;Repositioned     Hand Dominance     Extremity/Trunk Assessment Upper Extremity Assessment Upper Extremity Assessment: Overall WFL for tasks assessed   Lower Extremity Assessment Lower Extremity Assessment: Defer to PT evaluation   Cervical / Trunk Assessment Cervical / Trunk Assessment: Normal   Communication Communication Communication: No difficulties   Cognition Arousal/Alertness: Awake/alert Behavior During Therapy: Impulsive Overall Cognitive Status: Within Functional Limits for tasks assessed                                     General Comments       Exercises     Shoulder Instructions      Home Living Family/patient expects to be discharged to:: Private residence Living Arrangements: Spouse/significant other Available Help at Discharge: Family Type of Home: House Home Access: Stairs to enter Technical brewer of Steps: 3   Home Layout: Multi-level   Alternate Level Stairs-Rails: Right Bathroom Shower/Tub: Occupational psychologist: Standard  Home Equipment: None          Prior Functioning/Environment Level of Independence: Independent                 OT Problem List: Pain      OT Treatment/Interventions:      OT Goals(Current goals can be found in the care plan section) Acute Rehab OT Goals Patient Stated Goal: Reduce pain and start to travel.  Play tennis. OT Goal Formulation: With patient Time For Goal Achievement: 07/24/20 Potential to Achieve Goals: Good  OT Frequency:     Barriers to D/C:            Co-evaluation              AM-PAC OT "6 Clicks" Daily Activity     Outcome Measure Help from another person eating meals?: None Help from another person taking care of personal grooming?: None Help from another person toileting, which includes using toliet, bedpan, or urinal?: None Help  from another person bathing (including washing, rinsing, drying)?: None Help from another person to put on and taking off regular upper body clothing?: None Help from another person to put on and taking off regular lower body clothing?: None 6 Click Score: 24   End of Session Equipment Utilized During Treatment: Back brace  Activity Tolerance: Patient tolerated treatment well Patient left: in bed;with call bell/phone within reach  OT Visit Diagnosis: Pain Pain - part of body:  (back)                Time: 3009-2330 OT Time Calculation (min): 43 min Charges:  OT General Charges $OT Visit: 1 Visit OT Evaluation $OT Eval Moderate Complexity: 1 Mod OT Treatments $Self Care/Home Management : 23-37 mins  06/30/2020  Rich, OTR/L  Acute Rehabilitation Services  Office:  [phone]   Cody Mcconnell 06/30/2020, 8:55 AM

## 2020-06-30 NOTE — Discharge Instructions (Signed)
Wound Care °Remove dressing in 2-3 days °Leave incision open to air. °You may shower. °Do not scrub directly on incision.  °Do not put any creams, lotions, or ointments on incision. °Activity °Walk each and every day, increasing distance each day. °No lifting greater than 5 lbs.  Avoid bending, arching, and twisting. °No driving for 2 weeks; may ride as a passenger locally. °If provided with back brace, wear when out of bed.  It is not necessary to wear in bed. °Diet °Resume your normal diet.  °Return to Work °Will be discussed at you follow up appointment. °Call Your Doctor If Any of These Occur °Redness, drainage, or swelling at the wound.  °Temperature greater than 101 degrees. °Severe pain not relieved by pain medication. °Incision starts to come apart. °Follow Up Appt °Call today for appointment in 2-3 weeks ([phone]) or for problems.  If you have any hardware placed in your spine, you will need an x-ray before your appointment.  °

## 2020-06-30 NOTE — Discharge Summary (Addendum)
Physician Discharge Summary  Patient ID: Cody Mcconnell MRN: [id number] DOB/AGE: [birth year] 74 y.o.  Admit date: 06/29/2020 Discharge date: 06/30/2020  Admission Diagnoses: Lumbar spondylolisthesis, stenosis, HNP, Radiculopathy, Lumbar region, lumbago L 23 level  Discharge Diagnoses: Lumbar spondylolisthesis, stenosis, HNP, Radiculopathy, Lumbar region, lumbago L 23 level  Active Problems:   Spondylolisthesis of lumbar region   Discharged Condition: good  Hospital Course: The patient was admitted on 06/29/2020 and taken to the operating room where the patient underwent decompression and fusion. The patient tolerated the procedure well and was taken to the recovery room and then to the floor in stable condition. The hospital course was routine. There were no complications. The wound remained clean dry and intact. Pt had appropriate back soreness. No complaints of leg pain or new N/T/W. The patient remained afebrile with stable vital signs, and tolerated a regular diet. The patient continued to increase activities, and pain was well controlled with oral pain medications.  Consults: None  Significant Diagnostic Studies: radiology: X-Ray  Treatments: surgery: Right Lumbar Two-Three Anterolateral lumbar interbody fusion with lateral plate (Right) - Right Lumbar Two-Three Anterolateral lumbar interbody fusion with lateral plate  Discharge Exam: Blood pressure (!) 148/72, pulse 67, temperature 97.9 F (36.6 C), temperature source Oral, resp. rate 16, height 5' 8.5" (1.74 m), weight 76.9 kg, SpO2 100 %.  Physical Exam: He is A/O X4, conversant, and in good spirits. Reports minimal incisional discomfort and persistent right focal groin pain. MAEW with good strength. Dressing is CDI. Incision is well approximated with no erythema, edema, or drainage.   Disposition: Discharge disposition: 01-Home or Self Care        Allergies as of 06/30/2020   No Known Allergies     Medication  List    TAKE these medications   acetaminophen 325 MG tablet Commonly known as: TYLENOL Take 650 mg by mouth every 6 (six) hours as needed for moderate pain.   Allegra Allergy 180 MG tablet Generic drug: fexofenadine Take 180 mg by mouth every other day.   cetirizine 10 MG tablet Commonly known as: ZYRTEC Take 10 mg by mouth every other day.   diltiazem 120 MG 24 hr capsule Commonly known as: CARDIZEM CD TAKE 1 CAPSULE(120 MG) BY MOUTH DAILY   fluticasone 50 MCG/ACT nasal spray Commonly known as: FLONASE Place 1 spray into both nostrils daily as needed for allergies.   MULTIVITAMIN ADULT PO Take 1 tablet by mouth daily.   rivaroxaban 20 MG Tabs tablet Commonly known as: Xarelto Take 1 tablet (20 mg total) by mouth daily with supper.   simvastatin 40 MG tablet Commonly known as: ZOCOR Take 40 mg by mouth daily.   TURMERIC PO Take 1 tablet by mouth daily.        Signed: Marvis Moeller, DNP, NP-C 06/30/2020, 12:12 PM

## 2020-07-04 NOTE — Anesthesia Postprocedure Evaluation (Signed)
Anesthesia Post Note  Patient: Cody Mcconnell  Procedure(s) Performed: Right Lumbar Two-Three Anterolateral lumbar interbody fusion with lateral plate (Right Spine Lumbar)     Patient location during evaluation: PACU Anesthesia Type: General Level of consciousness: awake and alert Pain management: pain level controlled Vital Signs Assessment: post-procedure vital signs reviewed and stable Respiratory status: spontaneous breathing, nonlabored ventilation, respiratory function stable and patient connected to nasal cannula oxygen Cardiovascular status: blood pressure returned to baseline and stable Postop Assessment: no apparent nausea or vomiting Anesthetic complications: no   No complications documented.  Last Vitals:  Vitals:   06/30/20 0735 06/30/20 1131  BP: (!) 148/76 (!) 148/72  Pulse: 72 67  Resp: 16 16  Temp: 36.7 C 36.6 C  SpO2: 99% 100%    Last Pain:  Vitals:   06/30/20 1131  TempSrc: Oral  PainSc:                  Darah Simkin

## 2020-07-20 DIAGNOSIS — M5416 Radiculopathy, lumbar region: Secondary | ICD-10-CM | POA: Diagnosis not present

## 2020-07-20 DIAGNOSIS — M5126 Other intervertebral disc displacement, lumbar region: Secondary | ICD-10-CM | POA: Diagnosis not present

## 2020-07-20 DIAGNOSIS — M4316 Spondylolisthesis, lumbar region: Secondary | ICD-10-CM | POA: Diagnosis not present

## 2020-07-20 DIAGNOSIS — M48062 Spinal stenosis, lumbar region with neurogenic claudication: Secondary | ICD-10-CM | POA: Diagnosis not present

## 2020-07-20 DIAGNOSIS — M5441 Lumbago with sciatica, right side: Secondary | ICD-10-CM | POA: Diagnosis not present

## 2020-07-20 DIAGNOSIS — M5136 Other intervertebral disc degeneration, lumbar region: Secondary | ICD-10-CM | POA: Diagnosis not present

## 2020-07-21 DIAGNOSIS — Z23 Encounter for immunization: Secondary | ICD-10-CM | POA: Diagnosis not present

## 2020-07-27 DIAGNOSIS — H73892 Other specified disorders of tympanic membrane, left ear: Secondary | ICD-10-CM | POA: Diagnosis not present

## 2020-07-27 DIAGNOSIS — H7012 Chronic mastoiditis, left ear: Secondary | ICD-10-CM | POA: Diagnosis not present

## 2020-07-27 DIAGNOSIS — Z9622 Myringotomy tube(s) status: Secondary | ICD-10-CM | POA: Diagnosis not present

## 2020-07-27 DIAGNOSIS — H90A12 Conductive hearing loss, unilateral, left ear with restricted hearing on the contralateral side: Secondary | ICD-10-CM | POA: Diagnosis not present

## 2020-07-27 DIAGNOSIS — H90A32 Mixed conductive and sensorineural hearing loss, unilateral, left ear with restricted hearing on the contralateral side: Secondary | ICD-10-CM | POA: Diagnosis not present

## 2020-07-27 DIAGNOSIS — H748X2 Other specified disorders of left middle ear and mastoid: Secondary | ICD-10-CM | POA: Diagnosis not present

## 2020-08-02 ENCOUNTER — Encounter: Payer: Self-pay | Admitting: Cardiovascular Disease

## 2020-08-02 ENCOUNTER — Other Ambulatory Visit: Payer: Self-pay

## 2020-08-02 ENCOUNTER — Ambulatory Visit (INDEPENDENT_AMBULATORY_CARE_PROVIDER_SITE_OTHER): Payer: Medicare Other | Admitting: Cardiovascular Disease

## 2020-08-02 VITALS — BP 122/74 | HR 75 | Ht 68.0 in | Wt 170.4 lb

## 2020-08-02 DIAGNOSIS — I480 Paroxysmal atrial fibrillation: Secondary | ICD-10-CM | POA: Diagnosis not present

## 2020-08-02 DIAGNOSIS — I350 Nonrheumatic aortic (valve) stenosis: Secondary | ICD-10-CM | POA: Diagnosis not present

## 2020-08-02 DIAGNOSIS — I2510 Atherosclerotic heart disease of native coronary artery without angina pectoris: Secondary | ICD-10-CM | POA: Diagnosis not present

## 2020-08-02 NOTE — Patient Instructions (Signed)
Medication Instructions:  Your provider recommends that you continue on your current medications as directed. Please refer to the Current Medication list given to you today.   *If you need a refill on your cardiac medications before your next appointment, please call your pharmacy*  Testing/Procedures: Your provider has requested that you have an echocardiogram. Echocardiography is a painless test that uses sound waves to create images of your heart. It provides your doctor with information about the size and shape of your heart and how well your heart's chambers and valves are working. This procedure takes approximately one hour. There are no restrictions for this procedure.  Follow-Up: At [facility], you and your health needs are our priority.  As part of our continuing mission to provide you with exceptional heart care, we have created designated Provider Care Teams.  These Care Teams include your primary Cardiologist (physician) and Advanced Practice Providers (APPs -  Physician Assistants and Nurse Practitioners) who all work together to provide you with the care you need, when you need it. Your next appointment:   6 month(s) The format for your next appointment:   In Person Provider:   You may see Sherren Mocha, MD or one of the following Advanced Practice Providers on your designated Care Team:    Richardson Dopp, PA-C  [place], [place]

## 2020-08-02 NOTE — Progress Notes (Signed)
Cardiology Office Note:    Date:  08/02/2020   ID:  [name], DOB December 18, 1945, MRN [id number]  PCP:  Jani Gravel, MD  [facility] Cardiologist:  Sherren Mocha, MD  [place] Electrophysiologist:  None   Referring MD: Jani Gravel, MD   Chief Complaint  Patient presents with  . Atrial Fibrillation    History of Present Illness:    Cody Mcconnell is a 74 y.o. male with a hx of paroxysmal atrial fibrillation, aortic stenosis, and coronary calcification, presenting for follow-up evaluation.  Comorbid conditions include hypertension and mixed hyperlipidemia.  The patient's last stress test in 2019 was low risk.  He has not had atrial fibrillation documented over the past few years.  He discontinued rivaroxaban on his own.  He wore an event monitor recently and this demonstrated no evidence of atrial fibrillation.  The patient had atrial fibrillation in 2019.  He was not particularly symptomatic at that time.  He was anticoagulated and placed on diltiazem.  He had a lot of bruising problems and decided to discontinue rivaroxaban.  He has had no documented recurrence of atrial fibrillation since his initial diagnosis.  The patient denies chest pain, chest pressure, shortness of breath, or heart palpitations.  He denies lightheadedness, leg swelling, or syncope.  Past Medical History:  Diagnosis Date  . Anemia    SLIGHT ANEMIA 4 MONTHS AGO  . Aortic valve stenosis 12/19/2017   Echo 07/30/16 - EF 55-60, normal wall motion, normal diastolic function, mild aortic stenosis (mean 9, peak 16), mild LAE // Echo 4/19:  EF 60-65, no RWMA, Gr 2 DD, mild AS (mean 11, peak 24)   . Arthritis    OA  . Benign prostate hyperplasia    unspecified whether lower urinary tract sym. present   . Carotid atherosclerosis    Carotid US 4/19:  bilat ICA 1-39; vertebrals and subclavians normal  . Carotid atherosclerosis, bilateral 04/30/2018   1-39% on 12/2017 [facility]  . Coronary  artery calcification seen on CT scan 06/05/2018  . Dysrhythmia    ATRIAL FIB  . Elevated prostate specific antigen (PSA) 08/30/2016  . Glucose intolerance    "pre-diabetic"  . Hayfever   . Heart murmur   . History of exercise stress test    GXT 4/19:  ETT with good exercise tolerance (10:00); no chest pain; normal BP response; no diagnostic ST changes; negative adequate ETT.  Marland Kitchen HOH (hard of hearing)    BOTH EARS  . Low back pain with sciatica 06/02/2012  . Lumbar herniated disc 01/30/2018  . Mild aortic stenosis 04/30/2018   12/2017 [facility] : Mean gradient (S): 11 mm Hg. Peak gradient (S): 24 mm Hg.  . Numbness of right thumb    FROM CERVICAL NECK SURGERY  . Osteoarthritis of right hip 09/21/2018  . Overweight (BMI 25.0-29.9) 09/30/2018  . Paroxysmal atrial fibrillation ([place]) 06/05/2018  . Prediabetes 04/30/2018   not sure about this  . Prostatitis    unspecified prostatitis type  . Pure hypercholesterolemia     Past Surgical History:  Procedure Laterality Date  . ANTERIOR LAT LUMBAR FUSION Right 06/29/2020   Procedure: Right Lumbar Two-Three Anterolateral lumbar interbody fusion with lateral plate;  Surgeon: Erline Levine, MD;  Location: [facility];  Service: Neurosurgery;  Laterality: Right;  Right Lumbar Two-Three Anterolateral lumbar interbody fusion with lateral plate  . CERVICAL SPINE SURGERY  2011  . COLONOSCOPY    . HIP SURGERY Right  arthroscopy  . LUMBAR DISC SURGERY  2014   L2-3, L3 TO L4 X 2 2019 AT [facility]  . SHOULDER ARTHROSCOPY Right   . TOOTH EXTRACTION    . TOTAL HIP ARTHROPLASTY Right 09/29/2018   Procedure: TOTAL HIP ARTHROPLASTY ANTERIOR APPROACH;  Surgeon: Paralee Cancel, MD;  Location: [facility];  Service: Orthopedics;  Laterality: Right;  71    Current Medications: Current Meds  Medication Sig  . acetaminophen (TYLENOL) 325 MG tablet Take 650 mg by mouth every 6 (six) hours as needed for moderate pain.  . cetirizine (ZYRTEC) 10 MG tablet Take 10 mg  by mouth every other day.   . ciprofloxacin (CIPRO) 500 MG tablet Take by mouth.  . ciprofloxacin-dexamethasone (CIPRODEX) OTIC suspension Place in ear(s).  Marland Kitchen diltiazem (CARDIZEM CD) 120 MG 24 hr capsule TAKE 1 CAPSULE(120 MG) BY MOUTH DAILY  . Multiple Vitamins-Minerals (MULTIVITAMIN ADULT PO) Take 1 tablet by mouth daily.  . simvastatin (ZOCOR) 40 MG tablet Take 40 mg by mouth daily.  . TURMERIC PO Take 1 tablet by mouth daily.      Allergies:   Patient has no known allergies.   Social History   Socioeconomic History  . Marital status: Married    Spouse name: Not on file  . Number of children: 2  . Years of education: Not on file  . Highest education level: Not on file  Occupational History  . 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 Occupation: Long Term Care Facilities    Comment: Owns several  Tobacco Use  . Smoking status: Former Smoker    Packs/day: 2.00    Years: 9.00    Pack years: 18.00  . Smokeless tobacco: Never Used  . Tobacco comment: QUIT   Vaping Use  . Vaping Use: Never used  Substance and Sexual Activity  . Alcohol use: Never  . Drug use: Never  . Sexual activity: Not on file  Other Topics Concern  . 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 Difficulty of Paying Living Expenses: Not on file  Food Insecurity:   . Worried About Charity fundraiser in the Last Year: Not on file  . Ran Out of Food in the Last Year: Not on file  Transportation Needs:   . Lack of Transportation (Medical): Not on file  . Lack of Transportation (Non-Medical): Not on file  Physical Activity:   . Days of Exercise per Week: Not on file  . Minutes of Exercise per Session: Not on file  Stress:   . Feeling of Stress : Not on file  Social Connections:   . Frequency of Communication with Friends and  Family: Not on file  . Frequency of Social Gatherings with Friends and Family: Not on file  . Attends Religious Services: Not on file  . Active Member of Clubs or Organizations: Not on file  . Attends Archivist Meetings: Not on file  . Marital Status: Not on file     Family History: The patient's family history includes CAD in his father; CAD (age of onset: 56) in his brother; Congestive Heart Failure in his father; Dementia in his mother.  ROS:   Please see the history of present illness.    All other systems reviewed and are negative.  EKGs/Labs/Other Studies Reviewed:    The following studies were reviewed today: Cardiac event monitor 06/27/2020: Study Highlights  1. The basic rhythm is normal sinus with an average HR of 78 bpm 2. No atrial fibrillation or flutter 3. No high-grade heart block or pathologic pauses 4. There are rare PVC's with one 4 beat run noted 5. There are rare supraventricular beats with one supraventricular run lasting approximately 60 seconds  Echocardiogram 12/31/2017: Study Conclusions   - Left ventricle: The cavity size was normal. Wall thickness was  normal. Systolic function was normal. The estimated ejection  fraction was in the range of 60% to 65%. Wall motion was normal;  there were no regional wall motion abnormalities. Features are  consistent with a pseudonormal left ventricular filling pattern,  with concomitant abnormal relaxation and increased filling  pressure (grade 2 diastolic dysfunction).  - Aortic valve: There was mild stenosis. Mean gradient (S): 11 mm  Hg. Peak gradient (S): 24 mm Hg.   EKG:  EKG is not ordered today.    Recent Labs: 06/27/2020: BUN 24; Creatinine, Ser 0.91; Hemoglobin 13.4; Platelets 257; Potassium 4.1; Sodium 139  Recent Lipid Panel No results found for: CHOL, TRIG, HDL, CHOLHDL, VLDL, LDLCALC, LDLDIRECT   Risk Assessment/Calculations:     CHA2DS2-VASc Score = 2  This indicates  a 2.2% annual risk of stroke. The patient's score is based upon: CHF History: 0 HTN History: 1 Diabetes History: 0 Stroke History: 0 Vascular Disease History: 0 Age Score: 1 Gender Score: 0      Physical Exam:    VS:  BP 122/74   Pulse 75   Ht 5\' 8"  (1.727 m)   Wt 170 lb 6.4 oz (77.3 kg)   SpO2 98%   BMI 25.91 kg/m     Wt Readings from Last 3 Encounters:  08/02/20 170 lb 6.4 oz (77.3 kg)  06/29/20 169 lb 9.6 oz (76.9 kg)  06/27/20 169 lb 9.6 oz (76.9 kg)     GEN:  Well nourished, well developed in no acute distress HEENT: Normal NECK: No JVD; No carotid bruits LYMPHATICS: No lymphadenopathy CARDIAC: RRR, 2/6 harsh crescendo decrescendo murmur at the right upper sternal border, mid peaking RESPIRATORY:  Clear to auscultation without rales, wheezing or rhonchi  ABDOMEN: Soft, non-tender, non-distended MUSCULOSKELETAL:  No edema; No deformity  SKIN: Warm and dry NEUROLOGIC:  Alert and oriented x 3 PSYCHIATRIC:  Normal affect   ASSESSMENT:    1. Nonrheumatic aortic valve stenosis   2. Paroxysmal atrial fibrillation (HCC)    PLAN:    In order of problems listed above:  1. Exam stable, suggestive of mild to moderate aortic stenosis.  I have recommended an echocardiogram as it has been 2-1/2 years since his last study. 2. No atrial fibrillation has been documented over the past few years.  Lengthy discussion about pros and cons of anticoagulation.  CHA2DS2-VASc score is 1-2 as he has documented atherosclerosis but no history of ischemic events or flow-limiting stenoses in either the coronary or peripheral circulation.  He does not carry a diagnosis of hypertension and he only takes diltiazem since initially being diagnosed with atrial fibrillation as it was used for initial rate control.  We discussed resuming oral anticoagulation, monitoring for atrial fibrillation and staying off of anticoagulation, or considering left atrial appendage occlusion.  I demonstrated the  watchman procedure to the patient.  I think I would want to see some evidence of recurrent atrial fibrillation before considering left atrial appendage occlusion.  The patient is going to get a new generation Apple Watch and will monitor his heart rhythm with his watch.  Again, he has undergone event monitoring demonstrating no recurrence of atrial fibrillation.  He is clear that he does not want to go back on anticoagulation at this time, understanding that there is some risk of atrial fibrillation related stroke without oral anticoagulation.  I will see him back in 6 months to revisit these issues.    Shared Decision Making/Informed Consent        Medication Adjustments/Labs and Tests Ordered: Current medicines are reviewed at length with the patient today.  Concerns regarding medicines are outlined above.  Orders Placed This Encounter  Procedures  . ECHOCARDIOGRAM COMPLETE   No orders of the defined types were placed in this encounter.   Patient Instructions  Medication Instructions:  Your provider recommends that you continue on your current medications as directed. Please refer to the Current Medication list given to you today.   *If you need a refill on your cardiac medications before your next appointment, please call your pharmacy*  Testing/Procedures: Your provider has requested that you have an echocardiogram. Echocardiography is a painless test that uses sound waves to create images of your heart. It provides your doctor with information about the size and shape of your heart and how well your heart's chambers and valves are working. This procedure takes approximately one hour. There are no restrictions for this procedure.  Follow-Up: At [facility], you and your health needs are our priority.  As part of our continuing mission to provide you with exceptional heart care, we have created designated Provider Care Teams.  These Care Teams include your primary Cardiologist  (physician) and Advanced Practice Providers (APPs -  Physician Assistants and Nurse Practitioners) who all work together to provide you with the care you need, when you need it. Your next appointment:   6 month(s) The format for your next appointment:   In Person Provider:   You may see Sherren Mocha, MD or one of the following Advanced Practice Providers on your designated Care Team:    Richardson Dopp, PA-C  Robbie Lis, [place]      Signed, Sherren Mocha, MD  08/02/2020 2:27 PM    [place]

## 2020-08-07 ENCOUNTER — Other Ambulatory Visit: Payer: Self-pay

## 2020-08-07 ENCOUNTER — Ambulatory Visit (HOSPITAL_COMMUNITY): Payer: Medicare Other | Attending: Cardiology

## 2020-08-07 DIAGNOSIS — I350 Nonrheumatic aortic (valve) stenosis: Secondary | ICD-10-CM

## 2020-08-10 LAB — ECHOCARDIOGRAM COMPLETE
AR max vel: 1.07 cm2
AV Area VTI: 0.94 cm2
AV Area mean vel: 0.9 cm2
AV Mean grad: 20 mmHg
AV Peak grad: 31.4 mmHg
Ao pk vel: 2.8 m/s
Area-P 1/2: 3.08 cm2
P 1/2 time: 368 msec
S' Lateral: 3.3 cm

## 2020-08-11 ENCOUNTER — Encounter: Payer: Self-pay | Admitting: Physician Assistant

## 2020-08-11 ENCOUNTER — Other Ambulatory Visit: Payer: Self-pay

## 2020-08-11 DIAGNOSIS — I350 Nonrheumatic aortic (valve) stenosis: Secondary | ICD-10-CM

## 2020-08-14 DIAGNOSIS — N401 Enlarged prostate with lower urinary tract symptoms: Secondary | ICD-10-CM | POA: Diagnosis not present

## 2020-08-14 DIAGNOSIS — N138 Other obstructive and reflux uropathy: Secondary | ICD-10-CM | POA: Diagnosis not present

## 2020-08-14 DIAGNOSIS — R972 Elevated prostate specific antigen [PSA]: Secondary | ICD-10-CM | POA: Diagnosis not present

## 2020-08-21 DIAGNOSIS — M5441 Lumbago with sciatica, right side: Secondary | ICD-10-CM | POA: Diagnosis not present

## 2020-08-21 DIAGNOSIS — M5416 Radiculopathy, lumbar region: Secondary | ICD-10-CM | POA: Diagnosis not present

## 2020-08-21 DIAGNOSIS — G8929 Other chronic pain: Secondary | ICD-10-CM | POA: Diagnosis not present

## 2020-08-21 DIAGNOSIS — M5126 Other intervertebral disc displacement, lumbar region: Secondary | ICD-10-CM | POA: Diagnosis not present

## 2020-09-09 HISTORY — PX: OTHER SURGICAL HISTORY: SHX169

## 2020-09-12 DIAGNOSIS — M79604 Pain in right leg: Secondary | ICD-10-CM | POA: Diagnosis not present

## 2020-09-12 DIAGNOSIS — R1900 Intra-abdominal and pelvic swelling, mass and lump, unspecified site: Secondary | ICD-10-CM | POA: Diagnosis not present

## 2020-09-20 DIAGNOSIS — D485 Neoplasm of uncertain behavior of skin: Secondary | ICD-10-CM | POA: Diagnosis not present

## 2020-09-20 DIAGNOSIS — L814 Other melanin hyperpigmentation: Secondary | ICD-10-CM | POA: Diagnosis not present

## 2020-09-20 DIAGNOSIS — D225 Melanocytic nevi of trunk: Secondary | ICD-10-CM | POA: Diagnosis not present

## 2020-09-20 DIAGNOSIS — D034 Melanoma in situ of scalp and neck: Secondary | ICD-10-CM | POA: Diagnosis not present

## 2020-09-20 DIAGNOSIS — Z85828 Personal history of other malignant neoplasm of skin: Secondary | ICD-10-CM | POA: Diagnosis not present

## 2020-09-20 DIAGNOSIS — D0339 Melanoma in situ of other parts of face: Secondary | ICD-10-CM | POA: Diagnosis not present

## 2020-09-20 DIAGNOSIS — L570 Actinic keratosis: Secondary | ICD-10-CM | POA: Diagnosis not present

## 2020-09-20 DIAGNOSIS — L821 Other seborrheic keratosis: Secondary | ICD-10-CM | POA: Diagnosis not present

## 2020-09-20 DIAGNOSIS — L578 Other skin changes due to chronic exposure to nonionizing radiation: Secondary | ICD-10-CM | POA: Diagnosis not present

## 2020-09-22 DIAGNOSIS — M76891 Other specified enthesopathies of right lower limb, excluding foot: Secondary | ICD-10-CM | POA: Diagnosis not present

## 2020-09-28 DIAGNOSIS — R030 Elevated blood-pressure reading, without diagnosis of hypertension: Secondary | ICD-10-CM | POA: Diagnosis not present

## 2020-09-28 DIAGNOSIS — G8929 Other chronic pain: Secondary | ICD-10-CM | POA: Diagnosis not present

## 2020-09-28 DIAGNOSIS — Z6825 Body mass index (BMI) 25.0-25.9, adult: Secondary | ICD-10-CM | POA: Diagnosis not present

## 2020-09-28 DIAGNOSIS — M5416 Radiculopathy, lumbar region: Secondary | ICD-10-CM | POA: Diagnosis not present

## 2020-10-06 DIAGNOSIS — Z9622 Myringotomy tube(s) status: Secondary | ICD-10-CM | POA: Diagnosis not present

## 2020-10-06 DIAGNOSIS — Z974 Presence of external hearing-aid: Secondary | ICD-10-CM | POA: Diagnosis not present

## 2020-10-06 DIAGNOSIS — Z79899 Other long term (current) drug therapy: Secondary | ICD-10-CM | POA: Diagnosis not present

## 2020-10-06 DIAGNOSIS — H903 Sensorineural hearing loss, bilateral: Secondary | ICD-10-CM | POA: Diagnosis not present

## 2020-10-06 DIAGNOSIS — H9042 Sensorineural hearing loss, unilateral, left ear, with unrestricted hearing on the contralateral side: Secondary | ICD-10-CM | POA: Diagnosis not present

## 2020-10-06 DIAGNOSIS — H9312 Tinnitus, left ear: Secondary | ICD-10-CM | POA: Diagnosis not present

## 2020-10-06 DIAGNOSIS — H6982 Other specified disorders of Eustachian tube, left ear: Secondary | ICD-10-CM | POA: Diagnosis not present

## 2020-10-06 DIAGNOSIS — I350 Nonrheumatic aortic (valve) stenosis: Secondary | ICD-10-CM | POA: Diagnosis not present

## 2020-10-06 DIAGNOSIS — Z7901 Long term (current) use of anticoagulants: Secondary | ICD-10-CM | POA: Diagnosis not present

## 2020-10-06 DIAGNOSIS — H8102 Meniere's disease, left ear: Secondary | ICD-10-CM | POA: Diagnosis not present

## 2020-10-20 DIAGNOSIS — Z01818 Encounter for other preprocedural examination: Secondary | ICD-10-CM | POA: Diagnosis not present

## 2020-10-20 DIAGNOSIS — L905 Scar conditions and fibrosis of skin: Secondary | ICD-10-CM | POA: Diagnosis not present

## 2020-10-20 DIAGNOSIS — I10 Essential (primary) hypertension: Secondary | ICD-10-CM | POA: Diagnosis not present

## 2020-10-20 DIAGNOSIS — D225 Melanocytic nevi of trunk: Secondary | ICD-10-CM | POA: Diagnosis not present

## 2020-10-20 DIAGNOSIS — D235 Other benign neoplasm of skin of trunk: Secondary | ICD-10-CM | POA: Diagnosis not present

## 2020-10-20 DIAGNOSIS — D239 Other benign neoplasm of skin, unspecified: Secondary | ICD-10-CM | POA: Diagnosis not present

## 2020-10-20 DIAGNOSIS — E785 Hyperlipidemia, unspecified: Secondary | ICD-10-CM | POA: Diagnosis not present

## 2020-10-24 DIAGNOSIS — S39011A Strain of muscle, fascia and tendon of abdomen, initial encounter: Secondary | ICD-10-CM | POA: Diagnosis not present

## 2020-10-26 DIAGNOSIS — Z974 Presence of external hearing-aid: Secondary | ICD-10-CM | POA: Diagnosis not present

## 2020-10-26 DIAGNOSIS — H8102 Meniere's disease, left ear: Secondary | ICD-10-CM | POA: Diagnosis not present

## 2020-10-26 DIAGNOSIS — H903 Sensorineural hearing loss, bilateral: Secondary | ICD-10-CM | POA: Diagnosis not present

## 2020-10-31 DIAGNOSIS — D034 Melanoma in situ of scalp and neck: Secondary | ICD-10-CM | POA: Diagnosis not present

## 2020-10-31 DIAGNOSIS — L989 Disorder of the skin and subcutaneous tissue, unspecified: Secondary | ICD-10-CM | POA: Diagnosis not present

## 2020-11-03 DIAGNOSIS — D034 Melanoma in situ of scalp and neck: Secondary | ICD-10-CM | POA: Diagnosis not present

## 2020-11-07 DIAGNOSIS — Z9622 Myringotomy tube(s) status: Secondary | ICD-10-CM | POA: Diagnosis not present

## 2020-11-07 DIAGNOSIS — R42 Dizziness and giddiness: Secondary | ICD-10-CM | POA: Diagnosis not present

## 2020-11-07 DIAGNOSIS — H903 Sensorineural hearing loss, bilateral: Secondary | ICD-10-CM | POA: Diagnosis not present

## 2020-11-07 DIAGNOSIS — R110 Nausea: Secondary | ICD-10-CM | POA: Diagnosis not present

## 2020-11-07 DIAGNOSIS — R2689 Other abnormalities of gait and mobility: Secondary | ICD-10-CM | POA: Diagnosis not present

## 2020-11-10 DIAGNOSIS — G43109 Migraine with aura, not intractable, without status migrainosus: Secondary | ICD-10-CM | POA: Diagnosis not present

## 2020-11-10 DIAGNOSIS — H538 Other visual disturbances: Secondary | ICD-10-CM | POA: Diagnosis not present

## 2020-11-10 DIAGNOSIS — H903 Sensorineural hearing loss, bilateral: Secondary | ICD-10-CM | POA: Diagnosis not present

## 2020-11-10 DIAGNOSIS — Z4589 Encounter for adjustment and management of other implanted devices: Secondary | ICD-10-CM | POA: Diagnosis not present

## 2020-11-10 DIAGNOSIS — H8103 Meniere's disease, bilateral: Secondary | ICD-10-CM | POA: Diagnosis not present

## 2020-11-10 DIAGNOSIS — H6982 Other specified disorders of Eustachian tube, left ear: Secondary | ICD-10-CM | POA: Diagnosis not present

## 2020-11-10 DIAGNOSIS — Z79899 Other long term (current) drug therapy: Secondary | ICD-10-CM | POA: Diagnosis not present

## 2020-11-10 DIAGNOSIS — H9313 Tinnitus, bilateral: Secondary | ICD-10-CM | POA: Diagnosis not present

## 2020-12-01 DIAGNOSIS — Z4589 Encounter for adjustment and management of other implanted devices: Secondary | ICD-10-CM | POA: Diagnosis not present

## 2020-12-01 DIAGNOSIS — H9313 Tinnitus, bilateral: Secondary | ICD-10-CM | POA: Diagnosis not present

## 2020-12-01 DIAGNOSIS — H538 Other visual disturbances: Secondary | ICD-10-CM | POA: Diagnosis not present

## 2020-12-01 DIAGNOSIS — H8103 Meniere's disease, bilateral: Secondary | ICD-10-CM | POA: Diagnosis not present

## 2020-12-01 DIAGNOSIS — H903 Sensorineural hearing loss, bilateral: Secondary | ICD-10-CM | POA: Diagnosis not present

## 2020-12-14 DIAGNOSIS — H2513 Age-related nuclear cataract, bilateral: Secondary | ICD-10-CM | POA: Diagnosis not present

## 2020-12-14 DIAGNOSIS — H43812 Vitreous degeneration, left eye: Secondary | ICD-10-CM | POA: Diagnosis not present

## 2020-12-15 DIAGNOSIS — H538 Other visual disturbances: Secondary | ICD-10-CM | POA: Diagnosis not present

## 2020-12-15 DIAGNOSIS — Z9629 Presence of other otological and audiological implants: Secondary | ICD-10-CM | POA: Diagnosis not present

## 2020-12-15 DIAGNOSIS — H903 Sensorineural hearing loss, bilateral: Secondary | ICD-10-CM | POA: Diagnosis not present

## 2020-12-15 DIAGNOSIS — R260 Ataxic gait: Secondary | ICD-10-CM | POA: Diagnosis not present

## 2020-12-15 DIAGNOSIS — H8103 Meniere's disease, bilateral: Secondary | ICD-10-CM | POA: Diagnosis not present

## 2020-12-15 DIAGNOSIS — R972 Elevated prostate specific antigen [PSA]: Secondary | ICD-10-CM | POA: Diagnosis not present

## 2020-12-15 DIAGNOSIS — Z9622 Myringotomy tube(s) status: Secondary | ICD-10-CM | POA: Diagnosis not present

## 2020-12-15 DIAGNOSIS — Z79899 Other long term (current) drug therapy: Secondary | ICD-10-CM | POA: Diagnosis not present

## 2020-12-15 DIAGNOSIS — H9313 Tinnitus, bilateral: Secondary | ICD-10-CM | POA: Diagnosis not present

## 2020-12-15 DIAGNOSIS — H2513 Age-related nuclear cataract, bilateral: Secondary | ICD-10-CM | POA: Diagnosis not present

## 2020-12-21 DIAGNOSIS — H8103 Meniere's disease, bilateral: Secondary | ICD-10-CM | POA: Diagnosis not present

## 2020-12-22 DIAGNOSIS — H2513 Age-related nuclear cataract, bilateral: Secondary | ICD-10-CM | POA: Diagnosis not present

## 2020-12-22 DIAGNOSIS — H25043 Posterior subcapsular polar age-related cataract, bilateral: Secondary | ICD-10-CM | POA: Diagnosis not present

## 2020-12-22 DIAGNOSIS — H25013 Cortical age-related cataract, bilateral: Secondary | ICD-10-CM | POA: Diagnosis not present

## 2020-12-22 DIAGNOSIS — H2511 Age-related nuclear cataract, right eye: Secondary | ICD-10-CM | POA: Diagnosis not present

## 2021-01-11 DIAGNOSIS — H2511 Age-related nuclear cataract, right eye: Secondary | ICD-10-CM | POA: Diagnosis not present

## 2021-01-19 DIAGNOSIS — Z9622 Myringotomy tube(s) status: Secondary | ICD-10-CM | POA: Diagnosis not present

## 2021-01-19 DIAGNOSIS — H8103 Meniere's disease, bilateral: Secondary | ICD-10-CM | POA: Diagnosis not present

## 2021-01-19 DIAGNOSIS — H6982 Other specified disorders of Eustachian tube, left ear: Secondary | ICD-10-CM | POA: Diagnosis not present

## 2021-01-19 DIAGNOSIS — H9313 Tinnitus, bilateral: Secondary | ICD-10-CM | POA: Diagnosis not present

## 2021-01-19 DIAGNOSIS — H903 Sensorineural hearing loss, bilateral: Secondary | ICD-10-CM | POA: Diagnosis not present

## 2021-01-24 ENCOUNTER — Other Ambulatory Visit: Payer: Self-pay | Admitting: Surgery

## 2021-01-24 ENCOUNTER — Other Ambulatory Visit: Payer: Self-pay | Admitting: Licensed Clinical Social Worker

## 2021-01-24 ENCOUNTER — Other Ambulatory Visit (HOSPITAL_COMMUNITY): Payer: Self-pay | Admitting: Licensed Clinical Social Worker

## 2021-01-24 DIAGNOSIS — M24851 Other specific joint derangements of right hip, not elsewhere classified: Secondary | ICD-10-CM

## 2021-01-24 DIAGNOSIS — S338XXA Sprain of other parts of lumbar spine and pelvis, initial encounter: Secondary | ICD-10-CM

## 2021-01-24 DIAGNOSIS — S39011A Strain of muscle, fascia and tendon of abdomen, initial encounter: Secondary | ICD-10-CM

## 2021-01-24 DIAGNOSIS — M24852 Other specific joint derangements of left hip, not elsewhere classified: Secondary | ICD-10-CM

## 2021-01-26 DIAGNOSIS — Z23 Encounter for immunization: Secondary | ICD-10-CM | POA: Diagnosis not present

## 2021-01-29 ENCOUNTER — Ambulatory Visit (HOSPITAL_COMMUNITY)
Admission: RE | Admit: 2021-01-29 | Discharge: 2021-01-29 | Disposition: A | Discharge status: Home / Self Care | Payer: Medicare Other | Source: Ambulatory Visit | Attending: Surgery | Admitting: Surgery

## 2021-01-29 ENCOUNTER — Other Ambulatory Visit (HOSPITAL_COMMUNITY): Payer: Self-pay | Admitting: Surgery

## 2021-01-29 DIAGNOSIS — S39011A Strain of muscle, fascia and tendon of abdomen, initial encounter: Secondary | ICD-10-CM | POA: Insufficient documentation

## 2021-01-29 DIAGNOSIS — M24852 Other specific joint derangements of left hip, not elsewhere classified: Secondary | ICD-10-CM | POA: Insufficient documentation

## 2021-01-29 DIAGNOSIS — M24851 Other specific joint derangements of right hip, not elsewhere classified: Secondary | ICD-10-CM | POA: Insufficient documentation

## 2021-01-29 DIAGNOSIS — N400 Enlarged prostate without lower urinary tract symptoms: Secondary | ICD-10-CM | POA: Diagnosis not present

## 2021-01-29 DIAGNOSIS — G8929 Other chronic pain: Secondary | ICD-10-CM | POA: Diagnosis not present

## 2021-01-29 DIAGNOSIS — S338XXA Sprain of other parts of lumbar spine and pelvis, initial encounter: Secondary | ICD-10-CM | POA: Insufficient documentation

## 2021-01-29 DIAGNOSIS — R102 Pelvic and perineal pain: Secondary | ICD-10-CM | POA: Diagnosis not present

## 2021-01-29 DIAGNOSIS — N3289 Other specified disorders of bladder: Secondary | ICD-10-CM | POA: Diagnosis not present

## 2021-01-29 IMAGING — MR MR PELVIS W/O CM
8 series · 48 of 48 positions shown · non-contrast
Comparison: MRI [DATE]

CLINICAL DATA: Chronic NOSA pain

EXAM:
MRI PELVIS WITHOUT CONTRAST
TECHNIQUE: Multiplanar multisequence MR imaging of the pelvis was performed. No
intravenous contrast was administered.

[Series 1: 3 plane loc · axial · 3.0mm · 1.56mm/px · z∈[-18,+200]mm · 4 of 27 slices shown]
[im 1/27]
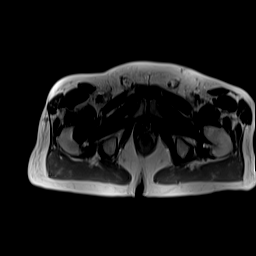
[im 9/27]
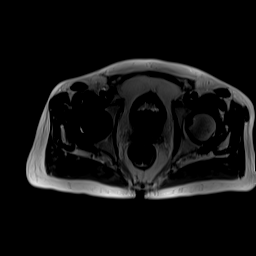
[im 18/27]
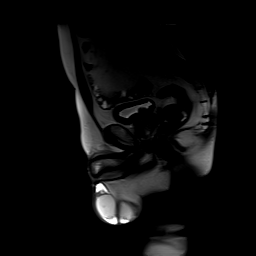
[im 27/27]
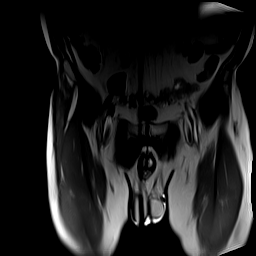

[Series 2: STIR · coronal · 4.0mm · 1.09mm/px · 6 of 36 slices shown]
[im 1/36]
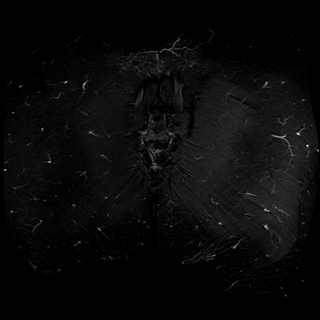
[im 8/36]
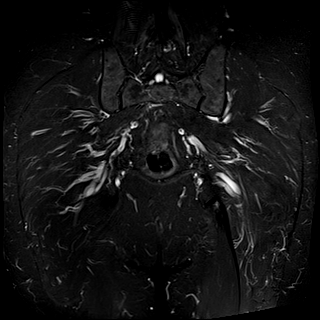
[im 15/36]
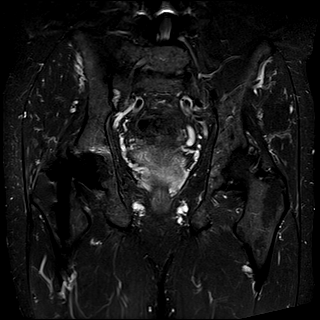
[im 22/36]
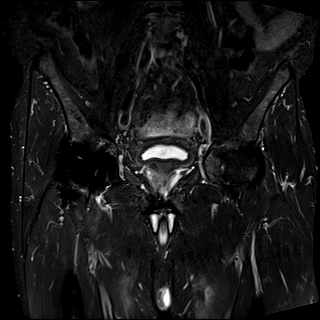
[im 29/36]
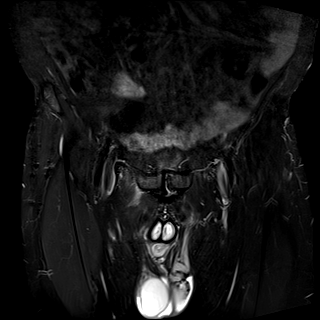
[im 36/36]
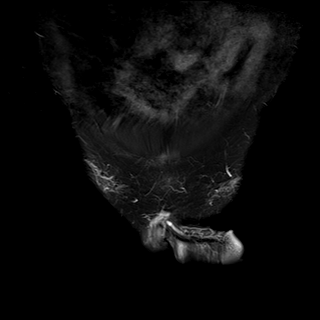

[Series 3: T1 · coronal · 4.0mm · 1.09mm/px · 6 of 36 slices shown]
[im 1/36]
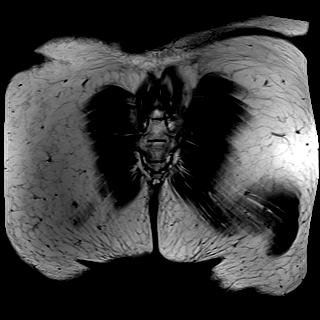
[im 8/36]
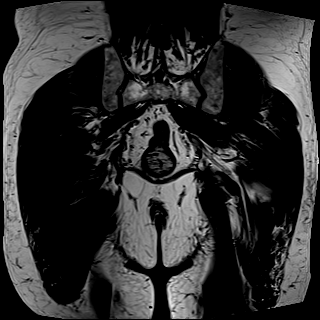
[im 15/36]
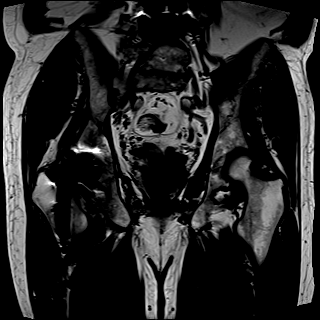
[im 22/36]
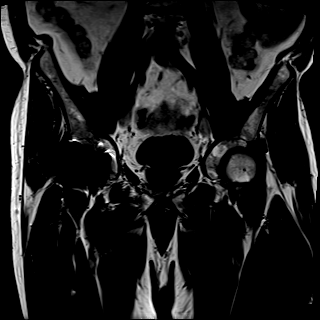
[im 29/36]
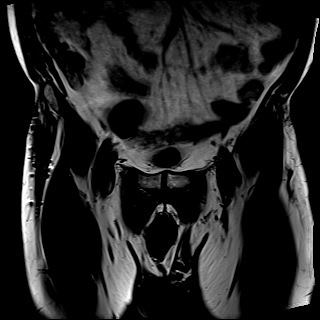
[im 36/36]
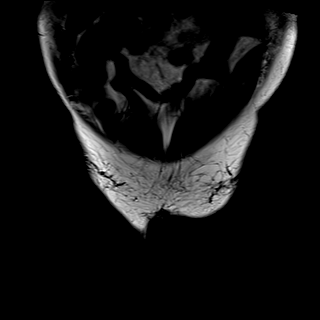

[Series 4: T2 fat-sat · axial · 4.0mm · 1.06mm/px · z∈[-51,+159]mm · 7 of 43 slices shown (1 of 4)]
[im 1/43]
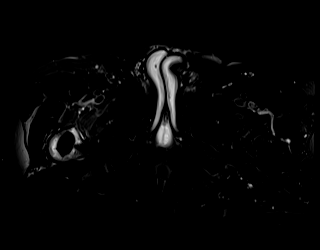
[im 8/43]
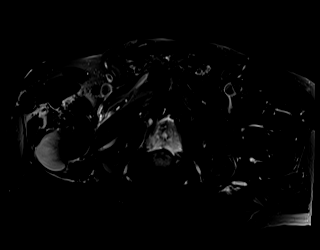
[im 15/43]
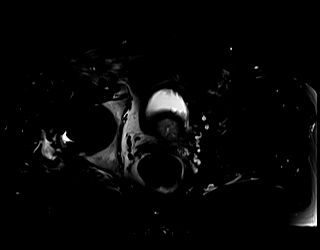
[im 22/43]
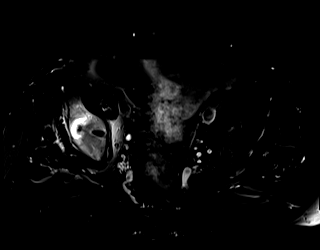
[im 29/43]
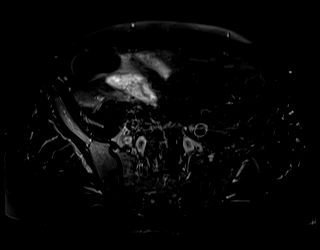
[im 36/43]
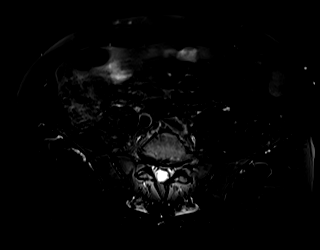
[im 43/43]
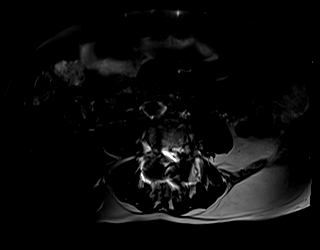

[Series 5: T2 fat-sat · sagittal · 4.0mm · 0.69mm/px · 7 of 43 slices shown (2 of 4)]
[im 1/43]
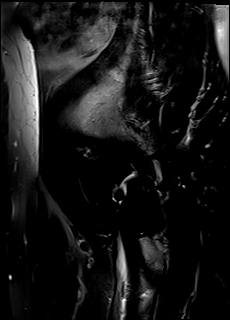
[im 8/43]
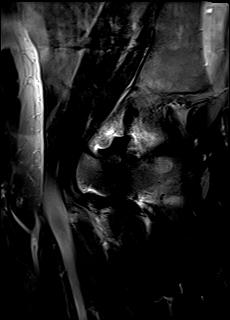
[im 15/43]
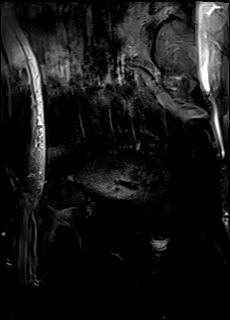
[im 22/43]
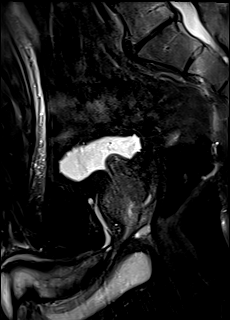
[im 29/43]
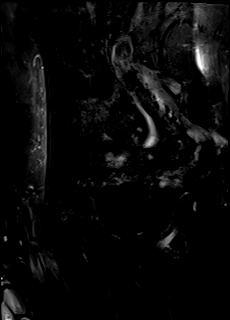
[im 36/43]
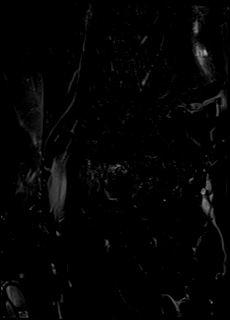
[im 43/43]
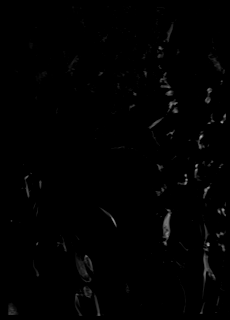

[Series 6: PD · axial · 4.0mm · 0.78mm/px · z∈[-113,+39]mm · 6 of 40 slices shown]
[im 1/40]
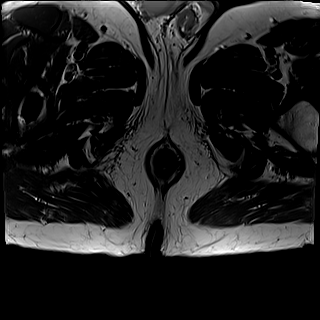
[im 8/40]
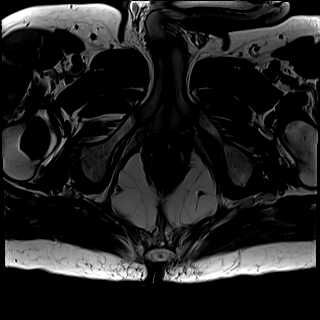
[im 16/40]
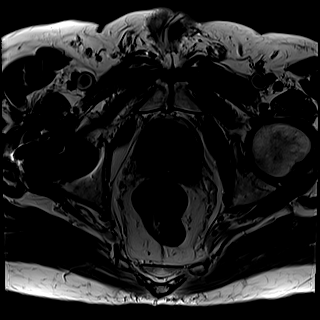
[im 24/40]
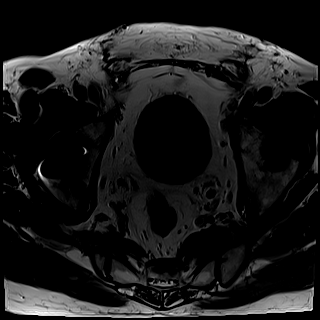
[im 32/40]
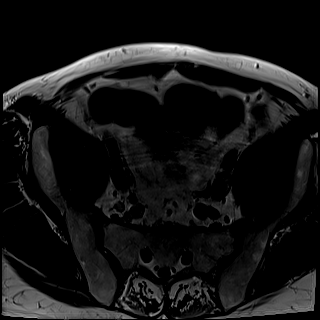
[im 40/40]
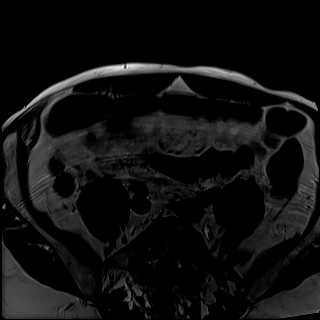

[Series 7: T2 fat-sat · axial · 4.0mm · 0.78mm/px · z∈[-100,+53]mm · 6 of 40 slices shown (3 of 4)]
[im 1/40]
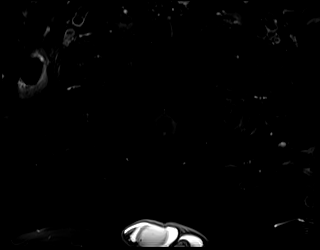
[im 8/40]
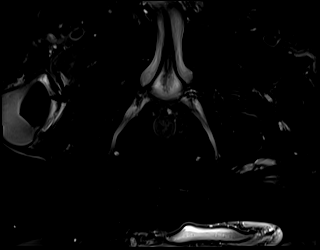
[im 16/40]
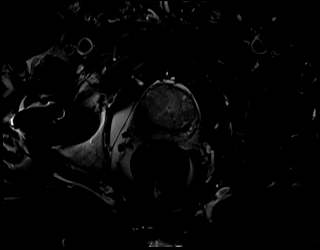
[im 24/40]
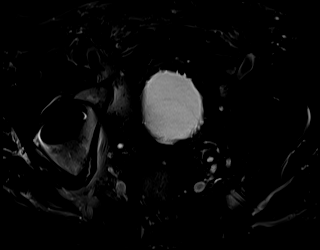
[im 32/40]
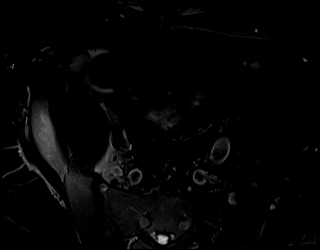
[im 40/40]
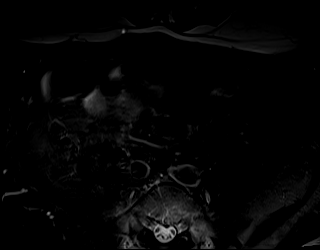

[Series 8: T2 fat-sat · axial · 4.0mm · 0.78mm/px · z∈[-100,+53]mm · 6 of 40 slices shown (4 of 4)]
[im 1/40]
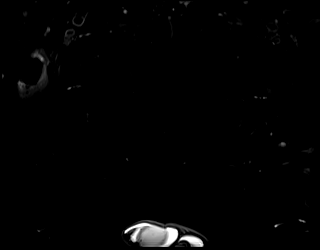
[im 8/40]
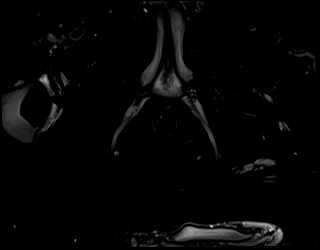
[im 16/40]
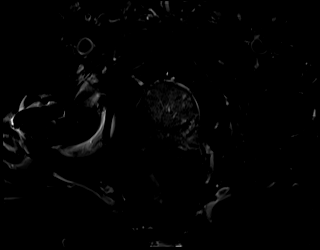
[im 24/40]
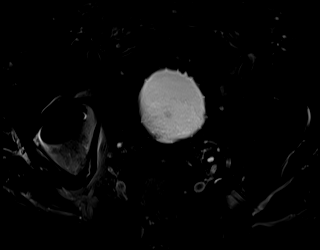
[im 32/40]
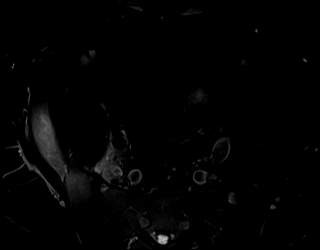
[im 40/40]
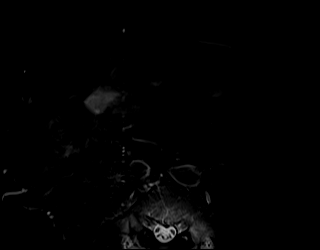

[48 of 48 positions shown; findings below may reference images not displayed]

FINDINGS: Musculoskeletal: Redemonstration of findings of bilateral athletic
pubalgia with partial-thickness clefts at the aponeurosis-bone
interface of the combined rectus abdominus-adductor longus
aponeurosis, left worse than right (series 2, image 29-30; series 8,
image 20). Tendinosis of the bilateral adductor tendon origins, left
worse than right. Intramuscular edema within the right adductor
longus and brevis muscles proximally is new from prior. Mild
asymmetric atrophy of the distal right rectus abdominus musculature.

Prior right total hip arthroplasty with associated metallic
susceptibility artifact. Partially visualized lumbar fusion
hardware. Pelvic bony ring intact. Mild-moderate arthropathy of the
pubic symphysis with trace effusion. SI joints are intact with mild
arthropathy, left worse than right. No acute fracture. No suspicious
marrow replacing bone lesions.

Urinary Tract: Circumferential thickening of the urinary bladder
wall.

Bowel:  Unremarkable visualized pelvic bowel loops.

Vascular/Lymphatic: No pathologically enlarged lymph nodes. No
significant vascular abnormality seen.

Reproductive:  Mildly enlarged, heterogeneous prostate gland.

Other:  None.
IMPRESSION: 1. Redemonstration of findings of bilateral athletic pubalgia, left
worse than right. New mild intramuscular edema within the proximal
right adductor muscles, suggesting strain. Otherwise, no significant
interval change from the previous MRI.
2. Prostatomegaly with findings suggestive of chronic bladder outlet
obstruction.

## 2021-02-01 ENCOUNTER — Ambulatory Visit (HOSPITAL_COMMUNITY): Payer: Medicare Other | Attending: Cardiology

## 2021-02-01 ENCOUNTER — Other Ambulatory Visit: Payer: Self-pay

## 2021-02-01 ENCOUNTER — Encounter: Payer: Self-pay | Admitting: Cardiovascular Disease

## 2021-02-01 ENCOUNTER — Ambulatory Visit (INDEPENDENT_AMBULATORY_CARE_PROVIDER_SITE_OTHER): Payer: Medicare Other | Admitting: Cardiovascular Disease

## 2021-02-01 VITALS — BP 102/62 | HR 74 | Ht 68.0 in | Wt 168.0 lb

## 2021-02-01 DIAGNOSIS — I350 Nonrheumatic aortic (valve) stenosis: Secondary | ICD-10-CM

## 2021-02-01 DIAGNOSIS — I480 Paroxysmal atrial fibrillation: Secondary | ICD-10-CM

## 2021-02-01 DIAGNOSIS — E782 Mixed hyperlipidemia: Secondary | ICD-10-CM

## 2021-02-01 DIAGNOSIS — I10 Essential (primary) hypertension: Secondary | ICD-10-CM

## 2021-02-01 LAB — ECHOCARDIOGRAM LIMITED
AR max vel: 1.15 cm2
AV Area VTI: 1.1 cm2
AV Area mean vel: 0.96 cm2
AV Mean grad: 20 mmHg
AV Peak grad: 32.9 mmHg
Ao pk vel: 2.87 m/s
Area-P 1/2: 4.49 cm2
P 1/2 time: 350 msec
S' Lateral: 2.1 cm

## 2021-02-01 NOTE — Patient Instructions (Signed)
Medication Instructions:  Your provider recommends that you continue on your current medications as directed. Please refer to the Current Medication list given to you today.   *If you need a refill on your cardiac medications before your next appointment, please call your pharmacy*   Follow-Up: You will be called to arrange your 1 year echo and office visit with Dr. Burt Knack.

## 2021-02-01 NOTE — Progress Notes (Signed)
Cardiology Office Note:    Date:  02/04/2021   ID:  Cody Mcconnell, DOB 1946-01-30, MRN [id number]  PCP:  Jani Gravel, MD   [facility] Providers Cardiologist:  Sherren Mocha, MD     Referring MD: Jani Gravel, MD   Chief Complaint  Patient presents with  . Aortic Stenosis    History of Present Illness:    Cody Mcconnell is a 75 y.o. male with a hx of paroxysmal atrial fibrillation and moderate aortic stenosis, presenting for follow-up evaluation.  Comorbid conditions include coronary artery calcification, hypertension, and mixed hyperlipidemia.  The patient has not had any episodes of atrial fibrillation over the past few years and he does not take any oral anticoagulant drugs.  He has had an aversion to taking these medications over time.  The patient has been doing well from a cardiovascular perspective.  He denies any symptoms of chest pain, chest pressure, shortness of breath, or heart palpitations.  He has had no lightheadedness, leg swelling, orthopnea, or PND.  He has been having some trouble with myofascial pain in the groin area and he actually went to [place] to have a surgery done earlier this year.  Reports no other interval medical issues.  Past Medical History:  Diagnosis Date  . Anemia    SLIGHT ANEMIA 4 MONTHS AGO  . Aortic valve stenosis 12/19/2017   Echo 07/30/16 - EF 55-60, mild aortic stenosis (mean 9) // Echo 4/19:  EF 60-65 mild AS (mean 11) // Echocardiogram 11/21: EF 60-65, no RWMA, mild LVH, Gr 1 DD, normal RVSF, trivial MR, calcified AV, mod AS (mean 20 mmHg, Vmax 280 cm/s, DI 0.27), trivial AI  // Echo 5/22: EF 60-65, moderate LVH, no RWMA, trivial MR, moderate AS (mean gradient 20 mmHg, V-max 287 cm/s, DI 0.35)   . Arthritis    OA  . Benign prostate hyperplasia    unspecified whether lower urinary tract sym. present   . Carotid atherosclerosis    Carotid US 4/19:  bilat ICA 1-39; vertebrals and subclavians normal  . Carotid atherosclerosis,  bilateral 04/30/2018   1-39% on 12/2017 [facility] Echo  . Coronary artery calcification seen on CT scan 06/05/2018  . Dysrhythmia    ATRIAL FIB  . Elevated prostate specific antigen (PSA) 08/30/2016  . Glucose intolerance    "pre-diabetic"  . Hayfever   . Heart murmur   . History of exercise stress test    GXT 4/19:  ETT with good exercise tolerance (10:00); no chest pain; normal BP response; no diagnostic ST changes; negative adequate ETT.  Marland Kitchen HOH (hard of hearing)    BOTH EARS  . Low back pain with sciatica 06/02/2012  . Lumbar herniated disc 01/30/2018  . Mild aortic stenosis 04/30/2018   12/2017 [facility] Echo : Mean gradient (S): 11 mm Hg. Peak gradient (S): 24 mm Hg.  . Numbness of right thumb    FROM CERVICAL NECK SURGERY  . Osteoarthritis of right hip 09/21/2018  . Overweight (BMI 25.0-29.9) 09/30/2018  . Paroxysmal atrial fibrillation ([place]) 06/05/2018  . Prediabetes 04/30/2018   not sure about this  . Prostatitis    unspecified prostatitis type  . Pure hypercholesterolemia     Past Surgical History:  Procedure Laterality Date  . ANTERIOR LAT LUMBAR FUSION Right 06/29/2020   Procedure: Right Lumbar Two-Three Anterolateral lumbar interbody fusion with lateral plate;  Surgeon: Erline Levine, MD;  Location: [facility];  Service: Neurosurgery;  Laterality: Right;  Right Lumbar Two-Three  Anterolateral lumbar interbody fusion with lateral plate  . CERVICAL SPINE SURGERY  2011  . COLONOSCOPY    . HIP SURGERY Right    arthroscopy  . LUMBAR DISC SURGERY  2014   L2-3, L3 TO L4 X 2 2019 AT [facility]  . SHOULDER ARTHROSCOPY Right   . TOOTH EXTRACTION    . TOTAL HIP ARTHROPLASTY Right 09/29/2018   Procedure: TOTAL HIP ARTHROPLASTY ANTERIOR APPROACH;  Surgeon: Paralee Cancel, MD;  Location: [facility];  Service: Orthopedics;  Laterality: Right;  71    Current Medications: Current Meds  Medication Sig  . acetaminophen (TYLENOL) 325 MG tablet Take 650 mg by mouth every 6 (six) hours as  needed for moderate pain.  . cetirizine (ZYRTEC) 10 MG tablet Take 10 mg by mouth every other day.   . diltiazem (CARDIZEM CD) 120 MG 24 hr capsule TAKE 1 CAPSULE(120 MG) BY MOUTH DAILY  . hydrochlorothiazide (HYDRODIURIL) 12.5 MG tablet Take 12.5 mg by mouth daily.  . Multiple Vitamins-Minerals (MULTIVITAMIN ADULT PO) Take 1 tablet by mouth daily.  . predniSONE (DELTASONE) 5 MG tablet Take 5 mg by mouth every other day.  . sildenafil (VIAGRA) 100 MG tablet Take 1 tablet by mouth as needed.  . simvastatin (ZOCOR) 40 MG tablet Take 40 mg by mouth daily.  . tadalafil (CIALIS) 20 MG tablet Take 20 mg by mouth as needed.  . TURMERIC PO Take 1 tablet by mouth daily.      Allergies:   Patient has no known allergies.   Social History   Socioeconomic History  . Marital status: Married    Spouse name: Not on file  . Number of children: 2  . Years of education: Not on file  . Highest education level: Not on file  Occupational History  . 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 Occupation: Long Term Care Facilities    Comment: Owns several  Tobacco Use  . Smoking status: Former Smoker    Packs/day: 2.00    Years: 9.00    Pack years: 18.00  . Smokeless tobacco: Never Used  . Tobacco comment: QUIT   Vaping Use  . Vaping Use: Never used  Substance and Sexual Activity  . Alcohol use: Never  . Drug use: Never  . Sexual activity: Not on file  Other Topics Concern  . Not on file  Social History Narrative   Previously in 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  ROS:   Please see the history of present illness.    All other systems reviewed and are negative.  EKGs/Labs/Other Studies Reviewed:    The following studies were reviewed today: Event Monitor: Study Highlights  1. The basic rhythm is normal sinus with an average HR of 78 bpm 2. No atrial fibrillation or flutter 3. No high-grade heart block or pathologic pauses 4. There are rare PVC's with one 4 beat run noted 5. There are rare supraventricular beats with one supraventricular run lasting approximately 60  seconds  Echo personally reviewed from today: normal LV function, moderate AS with mild AI (peak/mean gradient 33/20 mmHg), AVA 1.2 square cm, DI 0.37. formal interpretation pending.   EKG:  EKG is not ordered today.   Recent Labs: 06/27/2020: BUN 24; Creatinine, Ser 0.91; Hemoglobin 13.4; Platelets 257; Potassium 4.1; Sodium 139  Recent Lipid Panel No results found for: CHOL, TRIG, HDL, CHOLHDL, VLDL, LDLCALC, LDLDIRECT   Risk Assessment/Calculations:    CHA2DS2-VASc Score = 2  This indicates a 2.2% annual risk of stroke. The patient's score is based upon: CHF History: No HTN History: Yes Diabetes History: No Stroke History: No Vascular Disease History: No Age Score: 1 Gender Score: 0      Physical Exam:    VS:  BP 102/62   Pulse 74   Ht 5\' 8"  (1.727 m)   Wt 168 lb (76.2 kg)   SpO2 98%   BMI 25.54 kg/m     Wt Readings from Last 3 Encounters:  02/01/21 168 lb (76.2 kg)  08/02/20 170 lb 6.4 oz (77.3 kg)  06/29/20 169 lb 9.6 oz (76.9 kg)     GEN:  Well nourished, well developed in no acute distress HEENT: Normal NECK: No JVD; No carotid bruits LYMPHATICS: No lymphadenopathy CARDIAC: RRR, 2/6 crescendo decrescendo murmur at the right upper sternal border, A2 is present RESPIRATORY:  Clear to auscultation without rales, wheezing or rhonchi  ABDOMEN: Soft, non-tender, non-distended MUSCULOSKELETAL:  No edema; No  deformity  SKIN: Warm and dry NEUROLOGIC:  Alert and oriented x 3 PSYCHIATRIC:  Normal affect   ASSESSMENT:    1. Nonrheumatic aortic valve stenosis   2. Paroxysmal atrial fibrillation (HCC)   3. Essential hypertension   4. Mixed hyperlipidemia    PLAN:    In order of problems listed above:  1. As above, echo images were personally reviewed.  He has stable, moderate aortic stenosis with calculated valve area of 1.1 cm and mean gradient of 20 mmHg.  He is asymptomatic.  We discussed natural history of aortic stenosis as well as potential symptoms of chest discomfort, shortness of breath, fatigue, and lightheadedness/syncope.  He understands to be on the watch for any of these symptoms, but I think they are unlikely to occur in the next year based on the moderate severity of his aortic stenosis.  We will see back in 1 year with a follow-up echocardiogram. 2. No symptoms of atrial fibrillation and several years.  An event monitor in October 2021 demonstrated no evidence of atrial fibrillation.  The patient was previously on rivaroxaban and stopped this on his own because of extensive bruising and fear of bleeding. 3. Blood pressure well controlled on diltiazem and hydrochlorothiazide. 4. Treated with simvastatin.  Transaminases are normal and reviewed from his December 15, 2020 labs.  Lipids followed by PCP.  I do not have recent copies.   Medication Adjustments/Labs and Tests Ordered: Current medicines are reviewed at length with the patient today.  Concerns regarding medicines are outlined above.  Orders Placed This Encounter  Procedures  . ECHOCARDIOGRAM COMPLETE   No orders of the defined types were placed in this encounter.   Patient Instructions  Medication Instructions:  Your provider recommends that you continue on your current medications as directed. Please refer to the Current Medication list given to you today.   *If you need a refill on your cardiac medications before your  next appointment, please call your pharmacy*   Follow-Up: You will be called to arrange your  1 year echo and office visit with Dr. Burt Knack.    Signed, Sherren Mocha, MD  02/04/2021 10:59 AM    [place]

## 2021-02-02 ENCOUNTER — Encounter: Payer: Self-pay | Admitting: Physician Assistant

## 2021-02-06 ENCOUNTER — Other Ambulatory Visit: Payer: Self-pay | Admitting: Surgery

## 2021-02-06 ENCOUNTER — Other Ambulatory Visit (HOSPITAL_COMMUNITY): Payer: Self-pay | Admitting: Surgery

## 2021-02-06 DIAGNOSIS — M5416 Radiculopathy, lumbar region: Secondary | ICD-10-CM

## 2021-02-06 DIAGNOSIS — M549 Dorsalgia, unspecified: Secondary | ICD-10-CM

## 2021-02-08 ENCOUNTER — Other Ambulatory Visit: Payer: Self-pay

## 2021-02-08 ENCOUNTER — Ambulatory Visit (HOSPITAL_COMMUNITY)
Admission: RE | Admit: 2021-02-08 | Discharge: 2021-02-08 | Disposition: A | Discharge status: Home / Self Care | Payer: Medicare Other | Source: Ambulatory Visit | Attending: Surgery | Admitting: Surgery

## 2021-02-08 DIAGNOSIS — M549 Dorsalgia, unspecified: Secondary | ICD-10-CM | POA: Diagnosis not present

## 2021-02-08 DIAGNOSIS — M545 Low back pain, unspecified: Secondary | ICD-10-CM | POA: Diagnosis not present

## 2021-02-08 DIAGNOSIS — M5416 Radiculopathy, lumbar region: Secondary | ICD-10-CM | POA: Diagnosis not present

## 2021-02-08 IMAGING — MR MR LUMBAR SPINE W/O CM
5 of 8 series · 27 of 48 positions shown · non-contrast
Comparison: Intraoperative lumbar radiographs [DATE]. Lumbar
radiographs [DATE]. Lumbar MRI [DATE].

CLINICAL DATA: 75 year old male with prior surgery. Chronic low
back pain, lumbar radiculopathy, groin pain.

EXAM:
MRI LUMBAR SPINE WITHOUT CONTRAST
TECHNIQUE: Multiplanar, multisequence MR imaging of the lumbar spine was
performed. No intravenous contrast was administered.

[Series 5: T1 · sagittal · 4.0mm · 0.81mm/px · 3 of 17 slices shown (1 of 2)]
[im 1/17]
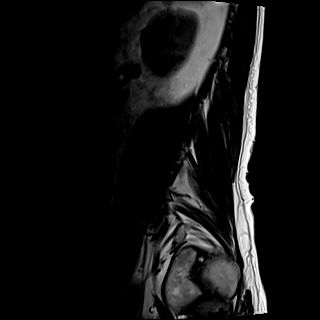
[im 9/17]
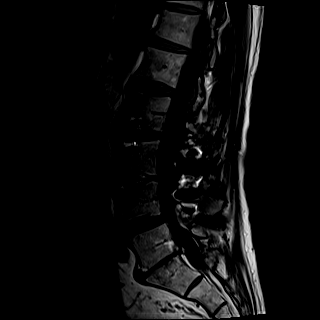
[im 17/17]
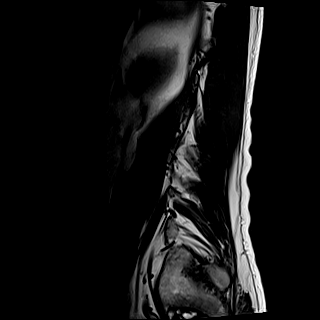

[Series 6: T2 · sagittal · 4.0mm · 0.81mm/px · 3 of 17 slices shown (1 of 2)]
[im 1/17]
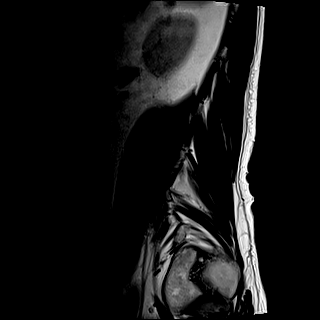
[im 9/17]
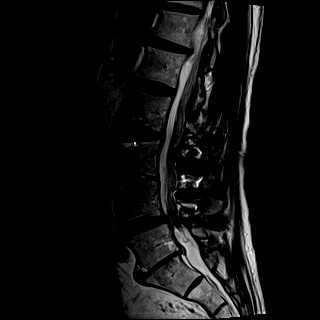
[im 17/17]
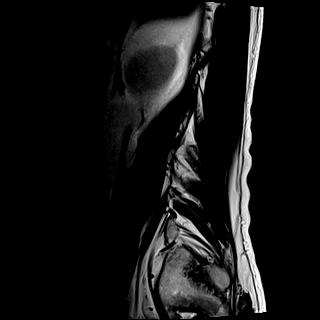

[Series 7: STIR · sagittal · 4.0mm · 1.02mm/px · 3 of 17 slices shown]
[im 1/17]
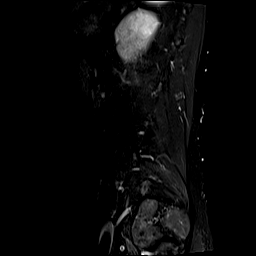
[im 9/17]
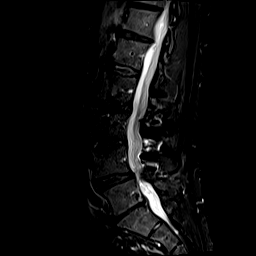
[im 17/17]
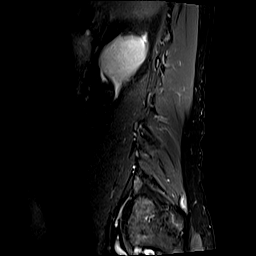

[Series 8: T2 · axial · 4.0mm · 0.78mm/px · z∈[-39,+210]mm · 9 of 47 slices shown (2 of 2)]
[im 1/47]
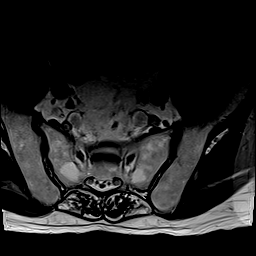
[im 6/47]
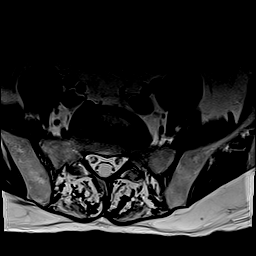
[im 12/47]
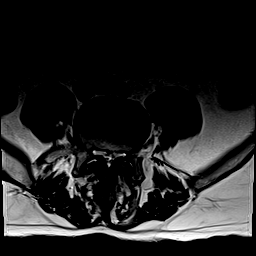
[im 18/47]
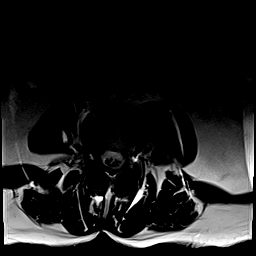
[im 24/47]
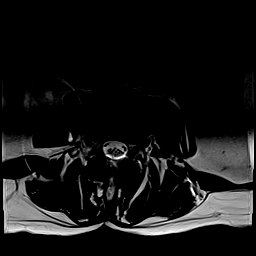
[im 29/47]
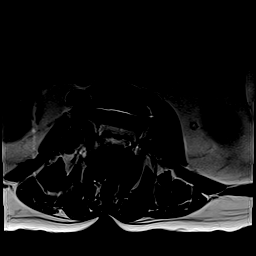
[im 35/47]
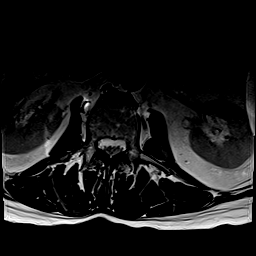
[im 41/47]
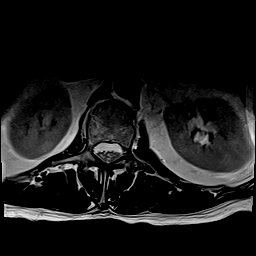
[im 47/47]
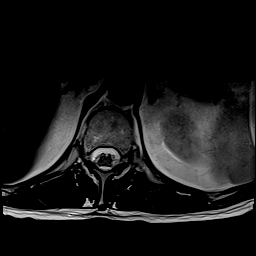

[Series 9: T1 · axial · 4.0mm · 0.39mm/px · z∈[-39,+210]mm · 9 of 47 slices shown (2 of 2)]
[im 1/47]
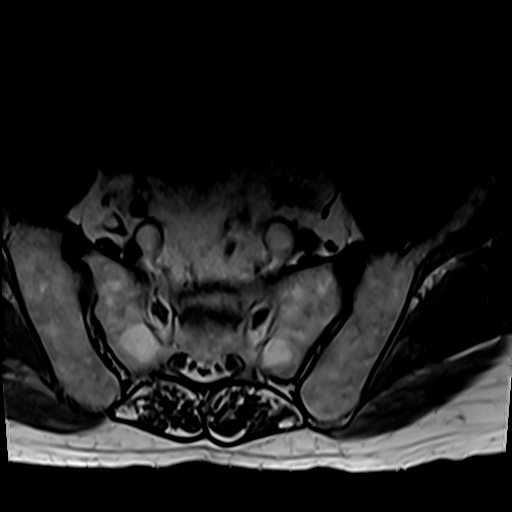
[im 6/47]
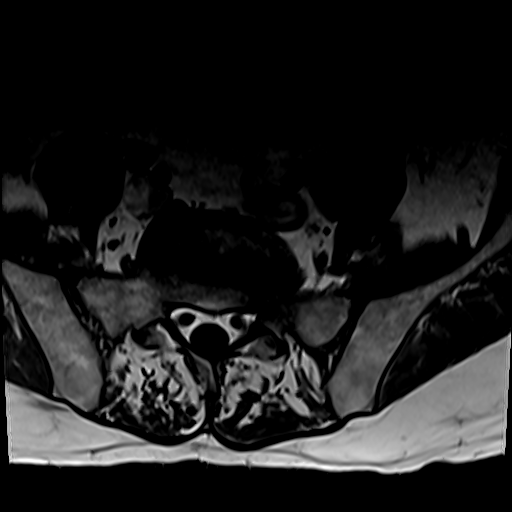
[im 12/47]
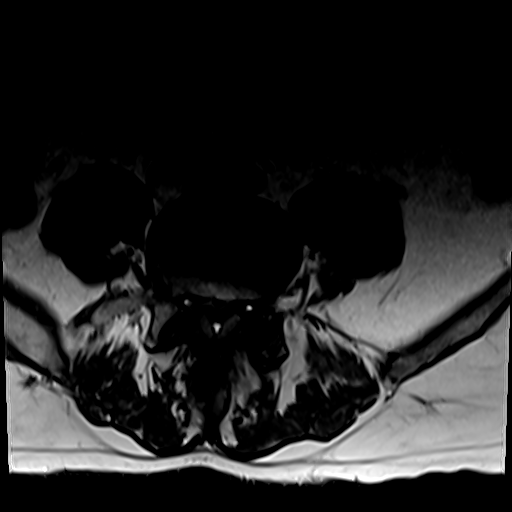
[im 18/47]
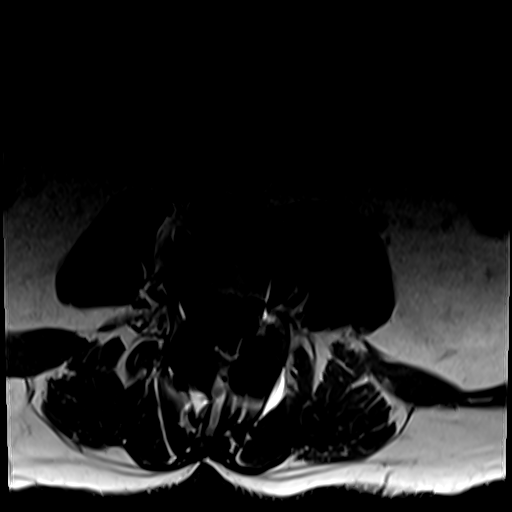
[im 24/47]
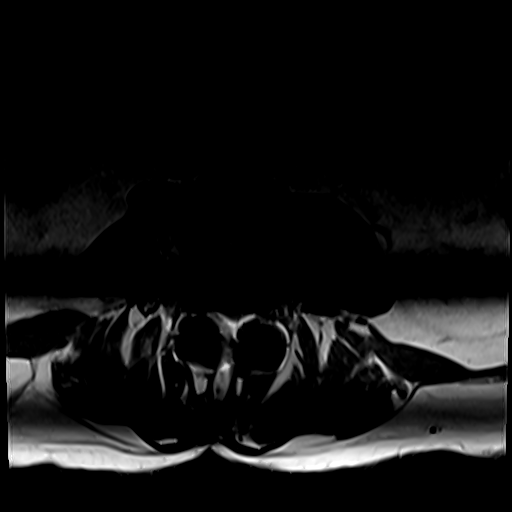
[im 29/47]
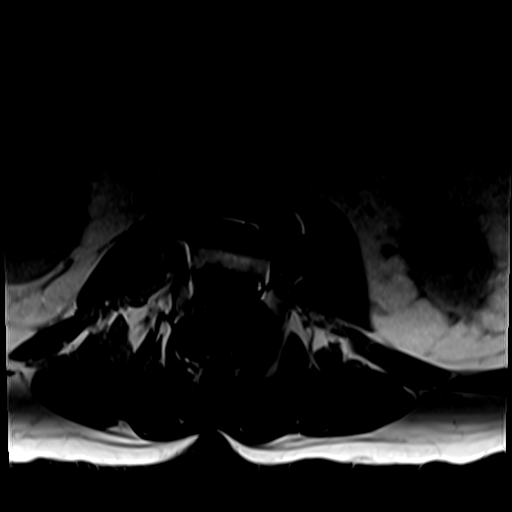
[im 35/47]
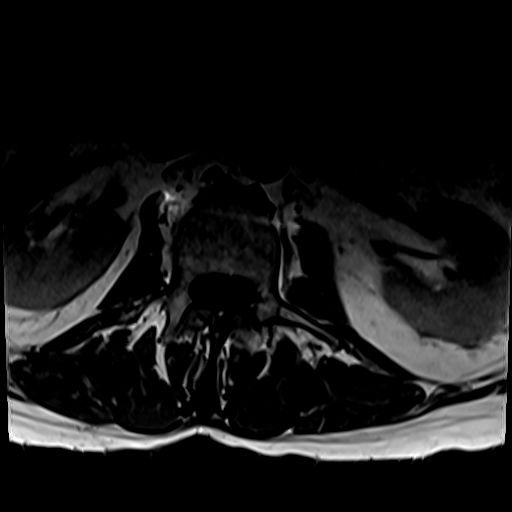
[im 41/47]
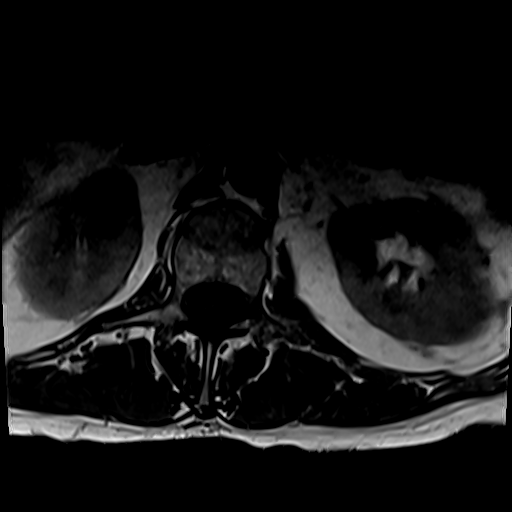
[im 47/47]
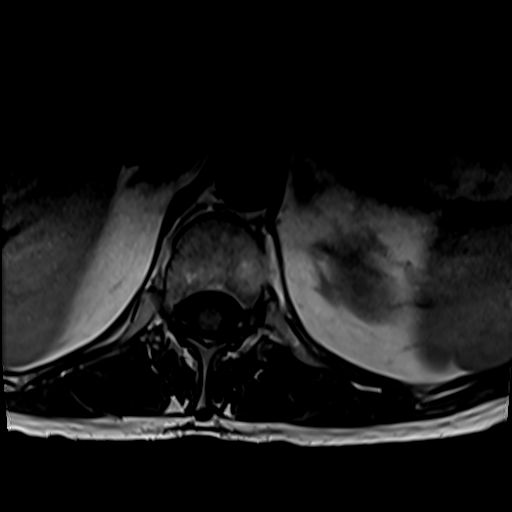

[27 of 48 positions shown; findings below may reference images not displayed]

FINDINGS: Segmentation: Normal, the same numbering system used on the [[facility]]
MRI.

Alignment: Stable lumbar lordosis since last year. Mild grade 1
anterolisthesis of L4 on L5 and similar retrolisthesis of L5 on S1.

Vertebrae: Mild hardware susceptibility artifact from L2-L4. No
marrow edema or evidence of acute osseous abnormality. Normal
background bone marrow signal. Intact visible sacrum and SI joints.

Conus medullaris and cauda equina: Conus extends to the T12-L1
level. See additional details of that level below. No lower spinal
cord or conus signal abnormality.

Paraspinal and other soft tissues: Postoperative changes to the
lower paraspinal soft tissues. No postoperative fluid collection.
Negative visible abdominal viscera.

Disc levels:

T11-T12: Mild disc bulging with chronic superior more than caudal
disc extrusion in the midline (series 6, image 7). Up to mild
associated spinal stenosis.

T12-L1: Chronic right paracentral broad-based disc extrusion best
seen on series 8, image 4. Mild spinal stenosis here at the level of
the conus. No conus mass effect.

L1-L2:  Stable and negative.

L2-L3: Sequelae of decompression and right lateral interbody fusion
since last year. Residual facet hypertrophy but resolved spinal
stenosis. Residual endplate spurring with mild bilateral L2
foraminal stenosis.

L3-L4: Stable prior decompression and fusion with no adverse
features.

L4-L5: Grade 1 anterolisthesis with circumferential disc bulge and
moderate to severe facet and ligament flavum hypertrophy. Trace
degenerative facet joint fluid. Moderate to severe multifactorial
spinal and bilateral lateral recess stenosis, on the right partially
related to a new small 6-7 mm degenerative synovial cyst (series 7,
image 8, descending right L5 nerve level). Mild L4 neural foraminal
stenosis.

L5-S1: Chronic retrolisthesis and severe disc space loss.
Circumferential disc osteophyte complex with left paracentral
component and mild facet hypertrophy. No spinal stenosis. Stable
borderline to mild lateral recess stenosis at the S1 nerve levels.
Stable mild to moderate bilateral L5 foraminal stenosis.
IMPRESSION: 1. Interval decompression and right lateral interbody fusion at
L2-L3 with resolved spinal stenosis there and no adverse features.
Stable L3-L4 decompression and fusion.

2. Significant adjacent segment disease at L4-L5 including grade 1
anterolisthesis and new 6-7 mm facet related synovial cyst
projecting into the right lateral recess. Moderate to severe spinal
and lateral recess stenosis. Query Right L5 radiculitis.

3. Stable chronic L5-S1 disc and endplate degeneration with up to
mild lateral recess and moderate foraminal stenosis.

4. Chronic lower thoracic disc herniations with mild spinal stenosis
at T11-T12 and T12-L1, conus located at the latter.

## 2021-02-16 DIAGNOSIS — F32A Depression, unspecified: Secondary | ICD-10-CM | POA: Diagnosis not present

## 2021-02-16 DIAGNOSIS — H903 Sensorineural hearing loss, bilateral: Secondary | ICD-10-CM | POA: Diagnosis not present

## 2021-02-16 DIAGNOSIS — Z7901 Long term (current) use of anticoagulants: Secondary | ICD-10-CM | POA: Diagnosis not present

## 2021-02-16 DIAGNOSIS — H8103 Meniere's disease, bilateral: Secondary | ICD-10-CM | POA: Diagnosis not present

## 2021-02-16 DIAGNOSIS — Z9622 Myringotomy tube(s) status: Secondary | ICD-10-CM | POA: Diagnosis not present

## 2021-02-16 DIAGNOSIS — H6982 Other specified disorders of Eustachian tube, left ear: Secondary | ICD-10-CM | POA: Diagnosis not present

## 2021-02-16 DIAGNOSIS — I4891 Unspecified atrial fibrillation: Secondary | ICD-10-CM | POA: Diagnosis not present

## 2021-02-16 DIAGNOSIS — H9313 Tinnitus, bilateral: Secondary | ICD-10-CM | POA: Diagnosis not present

## 2021-02-16 DIAGNOSIS — H938X2 Other specified disorders of left ear: Secondary | ICD-10-CM | POA: Diagnosis not present

## 2021-02-16 DIAGNOSIS — Z7952 Long term (current) use of systemic steroids: Secondary | ICD-10-CM | POA: Diagnosis not present

## 2021-02-16 DIAGNOSIS — Z4589 Encounter for adjustment and management of other implanted devices: Secondary | ICD-10-CM | POA: Diagnosis not present

## 2021-03-05 DIAGNOSIS — Z79899 Other long term (current) drug therapy: Secondary | ICD-10-CM | POA: Diagnosis not present

## 2021-03-05 DIAGNOSIS — D849 Immunodeficiency, unspecified: Secondary | ICD-10-CM | POA: Diagnosis not present

## 2021-03-05 DIAGNOSIS — M059 Rheumatoid arthritis with rheumatoid factor, unspecified: Secondary | ICD-10-CM | POA: Diagnosis not present

## 2021-03-05 DIAGNOSIS — R768 Other specified abnormal immunological findings in serum: Secondary | ICD-10-CM | POA: Diagnosis not present

## 2021-03-05 DIAGNOSIS — H8103 Meniere's disease, bilateral: Secondary | ICD-10-CM | POA: Diagnosis not present

## 2021-03-05 DIAGNOSIS — Z7952 Long term (current) use of systemic steroids: Secondary | ICD-10-CM | POA: Diagnosis not present

## 2021-03-06 DIAGNOSIS — M533 Sacrococcygeal disorders, not elsewhere classified: Secondary | ICD-10-CM | POA: Diagnosis not present

## 2021-03-14 DIAGNOSIS — H90A21 Sensorineural hearing loss, unilateral, right ear, with restricted hearing on the contralateral side: Secondary | ICD-10-CM | POA: Diagnosis not present

## 2021-03-14 DIAGNOSIS — H90A32 Mixed conductive and sensorineural hearing loss, unilateral, left ear with restricted hearing on the contralateral side: Secondary | ICD-10-CM | POA: Diagnosis not present

## 2021-03-15 DIAGNOSIS — H9313 Tinnitus, bilateral: Secondary | ICD-10-CM | POA: Diagnosis not present

## 2021-03-15 DIAGNOSIS — H6982 Other specified disorders of Eustachian tube, left ear: Secondary | ICD-10-CM | POA: Diagnosis not present

## 2021-03-15 DIAGNOSIS — Z4589 Encounter for adjustment and management of other implanted devices: Secondary | ICD-10-CM | POA: Diagnosis not present

## 2021-03-15 DIAGNOSIS — Z79899 Other long term (current) drug therapy: Secondary | ICD-10-CM | POA: Diagnosis not present

## 2021-03-15 DIAGNOSIS — Z7952 Long term (current) use of systemic steroids: Secondary | ICD-10-CM | POA: Diagnosis not present

## 2021-03-15 DIAGNOSIS — Z9841 Cataract extraction status, right eye: Secondary | ICD-10-CM | POA: Diagnosis not present

## 2021-03-15 DIAGNOSIS — H903 Sensorineural hearing loss, bilateral: Secondary | ICD-10-CM | POA: Diagnosis not present

## 2021-03-15 DIAGNOSIS — G43109 Migraine with aura, not intractable, without status migrainosus: Secondary | ICD-10-CM | POA: Diagnosis not present

## 2021-03-15 DIAGNOSIS — H8103 Meniere's disease, bilateral: Secondary | ICD-10-CM | POA: Diagnosis not present

## 2021-03-18 DIAGNOSIS — Z20822 Contact with and (suspected) exposure to covid-19: Secondary | ICD-10-CM | POA: Diagnosis not present

## 2021-04-03 DIAGNOSIS — H8103 Meniere's disease, bilateral: Secondary | ICD-10-CM | POA: Diagnosis not present

## 2021-04-04 DIAGNOSIS — M5416 Radiculopathy, lumbar region: Secondary | ICD-10-CM | POA: Diagnosis not present

## 2021-04-10 DIAGNOSIS — M5416 Radiculopathy, lumbar region: Secondary | ICD-10-CM | POA: Diagnosis not present

## 2021-04-17 DIAGNOSIS — D485 Neoplasm of uncertain behavior of skin: Secondary | ICD-10-CM | POA: Diagnosis not present

## 2021-04-17 DIAGNOSIS — L578 Other skin changes due to chronic exposure to nonionizing radiation: Secondary | ICD-10-CM | POA: Diagnosis not present

## 2021-04-17 DIAGNOSIS — L821 Other seborrheic keratosis: Secondary | ICD-10-CM | POA: Diagnosis not present

## 2021-04-17 DIAGNOSIS — Z85828 Personal history of other malignant neoplasm of skin: Secondary | ICD-10-CM | POA: Diagnosis not present

## 2021-04-17 DIAGNOSIS — M5416 Radiculopathy, lumbar region: Secondary | ICD-10-CM | POA: Diagnosis not present

## 2021-04-17 DIAGNOSIS — L820 Inflamed seborrheic keratosis: Secondary | ICD-10-CM | POA: Diagnosis not present

## 2021-04-17 DIAGNOSIS — L309 Dermatitis, unspecified: Secondary | ICD-10-CM | POA: Diagnosis not present

## 2021-04-17 DIAGNOSIS — D225 Melanocytic nevi of trunk: Secondary | ICD-10-CM | POA: Diagnosis not present

## 2021-04-17 DIAGNOSIS — L570 Actinic keratosis: Secondary | ICD-10-CM | POA: Diagnosis not present

## 2021-04-17 DIAGNOSIS — L814 Other melanin hyperpigmentation: Secondary | ICD-10-CM | POA: Diagnosis not present

## 2021-04-17 DIAGNOSIS — D2261 Melanocytic nevi of right upper limb, including shoulder: Secondary | ICD-10-CM | POA: Diagnosis not present

## 2021-04-23 DIAGNOSIS — M545 Low back pain, unspecified: Secondary | ICD-10-CM | POA: Diagnosis not present

## 2021-04-25 DIAGNOSIS — M5416 Radiculopathy, lumbar region: Secondary | ICD-10-CM | POA: Diagnosis not present

## 2021-04-25 DIAGNOSIS — I10 Essential (primary) hypertension: Secondary | ICD-10-CM | POA: Diagnosis not present

## 2021-04-25 DIAGNOSIS — M5136 Other intervertebral disc degeneration, lumbar region: Secondary | ICD-10-CM | POA: Diagnosis not present

## 2021-04-25 DIAGNOSIS — M5126 Other intervertebral disc displacement, lumbar region: Secondary | ICD-10-CM | POA: Diagnosis not present

## 2021-04-25 DIAGNOSIS — M48062 Spinal stenosis, lumbar region with neurogenic claudication: Secondary | ICD-10-CM | POA: Diagnosis not present

## 2021-04-25 DIAGNOSIS — M4316 Spondylolisthesis, lumbar region: Secondary | ICD-10-CM | POA: Diagnosis not present

## 2021-04-30 DIAGNOSIS — M5416 Radiculopathy, lumbar region: Secondary | ICD-10-CM | POA: Diagnosis not present

## 2021-05-01 DIAGNOSIS — M5416 Radiculopathy, lumbar region: Secondary | ICD-10-CM | POA: Diagnosis not present

## 2021-05-09 DIAGNOSIS — H8103 Meniere's disease, bilateral: Secondary | ICD-10-CM | POA: Diagnosis not present

## 2021-05-12 DIAGNOSIS — R197 Diarrhea, unspecified: Secondary | ICD-10-CM | POA: Diagnosis not present

## 2021-05-12 DIAGNOSIS — Z7901 Long term (current) use of anticoagulants: Secondary | ICD-10-CM | POA: Diagnosis not present

## 2021-05-12 DIAGNOSIS — I4891 Unspecified atrial fibrillation: Secondary | ICD-10-CM | POA: Diagnosis not present

## 2021-05-12 DIAGNOSIS — E860 Dehydration: Secondary | ICD-10-CM | POA: Diagnosis not present

## 2021-05-16 DIAGNOSIS — M5416 Radiculopathy, lumbar region: Secondary | ICD-10-CM | POA: Diagnosis not present

## 2021-05-21 ENCOUNTER — Other Ambulatory Visit: Payer: Self-pay | Admitting: Family Medicine

## 2021-05-21 DIAGNOSIS — R634 Abnormal weight loss: Secondary | ICD-10-CM | POA: Diagnosis not present

## 2021-05-21 DIAGNOSIS — R197 Diarrhea, unspecified: Secondary | ICD-10-CM

## 2021-05-21 DIAGNOSIS — R630 Anorexia: Secondary | ICD-10-CM | POA: Diagnosis not present

## 2021-05-21 DIAGNOSIS — R195 Other fecal abnormalities: Secondary | ICD-10-CM | POA: Diagnosis not present

## 2021-05-21 DIAGNOSIS — R110 Nausea: Secondary | ICD-10-CM

## 2021-05-21 DIAGNOSIS — K219 Gastro-esophageal reflux disease without esophagitis: Secondary | ICD-10-CM | POA: Diagnosis not present

## 2021-05-22 DIAGNOSIS — M5416 Radiculopathy, lumbar region: Secondary | ICD-10-CM | POA: Diagnosis not present

## 2021-05-24 ENCOUNTER — Other Ambulatory Visit: Payer: Self-pay | Admitting: Neurosurgery

## 2021-05-25 DIAGNOSIS — Z461 Encounter for fitting and adjustment of hearing aid: Secondary | ICD-10-CM | POA: Diagnosis not present

## 2021-05-25 DIAGNOSIS — H903 Sensorineural hearing loss, bilateral: Secondary | ICD-10-CM | POA: Diagnosis not present

## 2021-05-28 DIAGNOSIS — Z23 Encounter for immunization: Secondary | ICD-10-CM | POA: Diagnosis not present

## 2021-05-28 DIAGNOSIS — H8103 Meniere's disease, bilateral: Secondary | ICD-10-CM | POA: Diagnosis not present

## 2021-05-31 DIAGNOSIS — R197 Diarrhea, unspecified: Secondary | ICD-10-CM | POA: Diagnosis not present

## 2021-05-31 DIAGNOSIS — H6982 Other specified disorders of Eustachian tube, left ear: Secondary | ICD-10-CM | POA: Diagnosis not present

## 2021-05-31 DIAGNOSIS — H8103 Meniere's disease, bilateral: Secondary | ICD-10-CM | POA: Diagnosis not present

## 2021-05-31 DIAGNOSIS — R195 Other fecal abnormalities: Secondary | ICD-10-CM | POA: Diagnosis not present

## 2021-05-31 DIAGNOSIS — H90A21 Sensorineural hearing loss, unilateral, right ear, with restricted hearing on the contralateral side: Secondary | ICD-10-CM | POA: Diagnosis not present

## 2021-05-31 DIAGNOSIS — Z23 Encounter for immunization: Secondary | ICD-10-CM | POA: Diagnosis not present

## 2021-05-31 DIAGNOSIS — R110 Nausea: Secondary | ICD-10-CM | POA: Diagnosis not present

## 2021-05-31 DIAGNOSIS — Z4589 Encounter for adjustment and management of other implanted devices: Secondary | ICD-10-CM | POA: Diagnosis not present

## 2021-05-31 DIAGNOSIS — R630 Anorexia: Secondary | ICD-10-CM | POA: Diagnosis not present

## 2021-05-31 DIAGNOSIS — Z974 Presence of external hearing-aid: Secondary | ICD-10-CM | POA: Diagnosis not present

## 2021-05-31 DIAGNOSIS — H903 Sensorineural hearing loss, bilateral: Secondary | ICD-10-CM | POA: Diagnosis not present

## 2021-05-31 DIAGNOSIS — K219 Gastro-esophageal reflux disease without esophagitis: Secondary | ICD-10-CM | POA: Diagnosis not present

## 2021-05-31 DIAGNOSIS — S76201A Unspecified injury of adductor muscle, fascia and tendon of right thigh, initial encounter: Secondary | ICD-10-CM | POA: Diagnosis not present

## 2021-05-31 DIAGNOSIS — R634 Abnormal weight loss: Secondary | ICD-10-CM | POA: Diagnosis not present

## 2021-06-06 DIAGNOSIS — H8103 Meniere's disease, bilateral: Secondary | ICD-10-CM | POA: Diagnosis not present

## 2021-06-28 DIAGNOSIS — H8109 Meniere's disease, unspecified ear: Secondary | ICD-10-CM | POA: Diagnosis not present

## 2021-06-28 DIAGNOSIS — Z79631 Long term (current) use of antimetabolite agent: Secondary | ICD-10-CM | POA: Diagnosis not present

## 2021-06-28 DIAGNOSIS — Z7952 Long term (current) use of systemic steroids: Secondary | ICD-10-CM | POA: Diagnosis not present

## 2021-06-28 DIAGNOSIS — H8103 Meniere's disease, bilateral: Secondary | ICD-10-CM | POA: Diagnosis not present

## 2021-06-28 DIAGNOSIS — R768 Other specified abnormal immunological findings in serum: Secondary | ICD-10-CM | POA: Diagnosis not present

## 2021-06-28 DIAGNOSIS — Z79899 Other long term (current) drug therapy: Secondary | ICD-10-CM | POA: Diagnosis not present

## 2021-06-29 ENCOUNTER — Other Ambulatory Visit: Payer: Self-pay | Admitting: Neurological Surgery

## 2021-07-04 DIAGNOSIS — I10 Essential (primary) hypertension: Secondary | ICD-10-CM | POA: Diagnosis not present

## 2021-07-04 DIAGNOSIS — M25551 Pain in right hip: Secondary | ICD-10-CM | POA: Diagnosis not present

## 2021-07-04 DIAGNOSIS — M48061 Spinal stenosis, lumbar region without neurogenic claudication: Secondary | ICD-10-CM | POA: Diagnosis not present

## 2021-07-04 DIAGNOSIS — M4316 Spondylolisthesis, lumbar region: Secondary | ICD-10-CM | POA: Diagnosis not present

## 2021-07-04 DIAGNOSIS — E785 Hyperlipidemia, unspecified: Secondary | ICD-10-CM | POA: Diagnosis not present

## 2021-07-04 DIAGNOSIS — R011 Cardiac murmur, unspecified: Secondary | ICD-10-CM | POA: Diagnosis not present

## 2021-07-04 DIAGNOSIS — Z01818 Encounter for other preprocedural examination: Secondary | ICD-10-CM | POA: Diagnosis not present

## 2021-07-10 ENCOUNTER — Other Ambulatory Visit: Payer: Self-pay | Admitting: Neurological Surgery

## 2021-07-10 DIAGNOSIS — M7611 Psoas tendinitis, right hip: Secondary | ICD-10-CM | POA: Diagnosis not present

## 2021-07-24 DIAGNOSIS — Z0000 Encounter for general adult medical examination without abnormal findings: Secondary | ICD-10-CM | POA: Diagnosis not present

## 2021-07-24 DIAGNOSIS — Z125 Encounter for screening for malignant neoplasm of prostate: Secondary | ICD-10-CM | POA: Diagnosis not present

## 2021-07-24 DIAGNOSIS — I10 Essential (primary) hypertension: Secondary | ICD-10-CM | POA: Diagnosis not present

## 2021-07-24 DIAGNOSIS — E7800 Pure hypercholesterolemia, unspecified: Secondary | ICD-10-CM | POA: Diagnosis not present

## 2021-07-24 DIAGNOSIS — R739 Hyperglycemia, unspecified: Secondary | ICD-10-CM | POA: Diagnosis not present

## 2021-07-24 DIAGNOSIS — D649 Anemia, unspecified: Secondary | ICD-10-CM | POA: Diagnosis not present

## 2021-07-25 DIAGNOSIS — Z4802 Encounter for removal of sutures: Secondary | ICD-10-CM | POA: Diagnosis not present

## 2021-07-25 DIAGNOSIS — I10 Essential (primary) hypertension: Secondary | ICD-10-CM | POA: Diagnosis not present

## 2021-07-31 DIAGNOSIS — D649 Anemia, unspecified: Secondary | ICD-10-CM | POA: Diagnosis not present

## 2021-07-31 DIAGNOSIS — K219 Gastro-esophageal reflux disease without esophagitis: Secondary | ICD-10-CM | POA: Diagnosis not present

## 2021-07-31 DIAGNOSIS — I10 Essential (primary) hypertension: Secondary | ICD-10-CM | POA: Diagnosis not present

## 2021-07-31 DIAGNOSIS — R972 Elevated prostate specific antigen [PSA]: Secondary | ICD-10-CM | POA: Diagnosis not present

## 2021-07-31 DIAGNOSIS — Z23 Encounter for immunization: Secondary | ICD-10-CM | POA: Diagnosis not present

## 2021-07-31 DIAGNOSIS — I350 Nonrheumatic aortic (valve) stenosis: Secondary | ICD-10-CM | POA: Diagnosis not present

## 2021-07-31 DIAGNOSIS — Z0000 Encounter for general adult medical examination without abnormal findings: Secondary | ICD-10-CM | POA: Diagnosis not present

## 2021-07-31 DIAGNOSIS — E7800 Pure hypercholesterolemia, unspecified: Secondary | ICD-10-CM | POA: Diagnosis not present

## 2021-08-01 ENCOUNTER — Other Ambulatory Visit: Payer: Self-pay | Admitting: Surgery

## 2021-08-01 ENCOUNTER — Other Ambulatory Visit (HOSPITAL_COMMUNITY): Payer: Self-pay | Admitting: Surgery

## 2021-08-01 DIAGNOSIS — T1490XA Injury, unspecified, initial encounter: Secondary | ICD-10-CM

## 2021-08-07 ENCOUNTER — Other Ambulatory Visit: Payer: Self-pay | Admitting: Neurological Surgery

## 2021-08-07 ENCOUNTER — Ambulatory Visit (HOSPITAL_COMMUNITY)
Admission: RE | Admit: 2021-08-07 | Discharge: 2021-08-07 | Disposition: A | Discharge status: Home / Self Care | Payer: Medicare Other | Source: Ambulatory Visit | Attending: Neurological Surgery | Admitting: Neurological Surgery

## 2021-08-07 ENCOUNTER — Ambulatory Visit (HOSPITAL_COMMUNITY)
Admission: RE | Admit: 2021-08-07 | Discharge: 2021-08-07 | Disposition: A | Discharge status: Home / Self Care | Payer: Medicare Other | Source: Ambulatory Visit | Attending: Surgery | Admitting: Surgery

## 2021-08-07 ENCOUNTER — Other Ambulatory Visit (HOSPITAL_COMMUNITY): Payer: Self-pay | Admitting: Neurological Surgery

## 2021-08-07 DIAGNOSIS — M47896 Other spondylosis, lumbar region: Secondary | ICD-10-CM

## 2021-08-07 DIAGNOSIS — M533 Sacrococcygeal disorders, not elsewhere classified: Secondary | ICD-10-CM | POA: Diagnosis not present

## 2021-08-07 DIAGNOSIS — T1490XA Injury, unspecified, initial encounter: Secondary | ICD-10-CM | POA: Diagnosis not present

## 2021-08-07 DIAGNOSIS — M25551 Pain in right hip: Secondary | ICD-10-CM | POA: Diagnosis not present

## 2021-08-07 DIAGNOSIS — M4316 Spondylolisthesis, lumbar region: Secondary | ICD-10-CM | POA: Diagnosis not present

## 2021-08-07 DIAGNOSIS — N400 Enlarged prostate without lower urinary tract symptoms: Secondary | ICD-10-CM | POA: Diagnosis not present

## 2021-08-07 DIAGNOSIS — M48061 Spinal stenosis, lumbar region without neurogenic claudication: Secondary | ICD-10-CM | POA: Diagnosis not present

## 2021-08-07 DIAGNOSIS — S76111A Strain of right quadriceps muscle, fascia and tendon, initial encounter: Secondary | ICD-10-CM | POA: Diagnosis not present

## 2021-08-07 IMAGING — MR MR PELVIS W/O CM
6 series · 43 of 48 positions shown · non-contrast
Comparison: None.

CLINICAL DATA: Previous lumbar surgery and right hip surgery,
continued pain

EXAM:
MRI PELVIS WITHOUT CONTRAST
TECHNIQUE: Multiplanar multisequence MR imaging of the pelvis was performed. No
intravenous contrast was administered.

[Series 3: STIR · coronal · 4.0mm · 1.00mm/px · 8 of 36 slices shown]
[im 1/36]
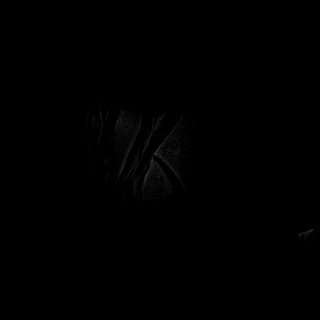
[im 6/36]
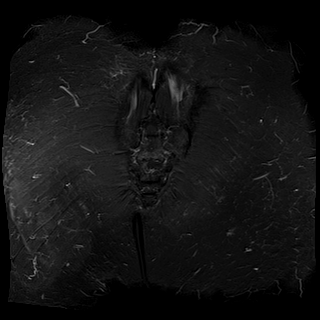
[im 11/36]
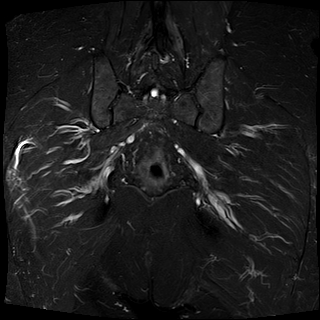
[im 16/36]
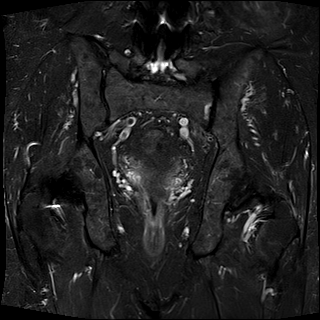
[im 21/36]
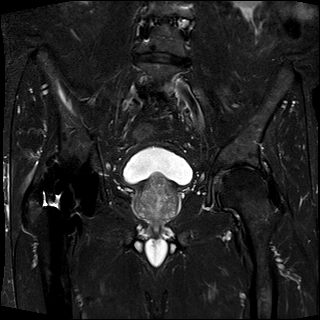
[im 26/36]
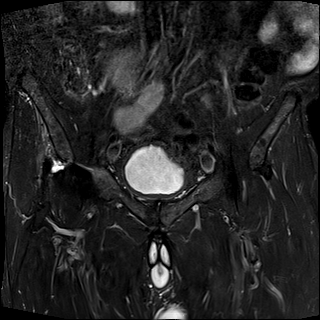
[im 31/36]
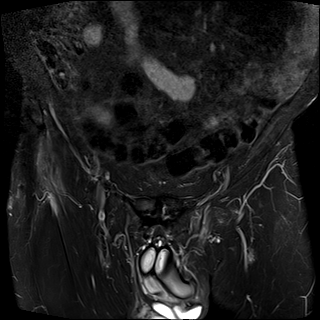
[im 36/36]
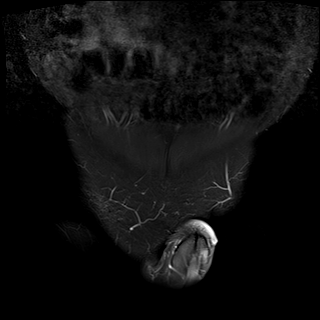

[Series 4: T1 · coronal · 4.0mm · 0.94mm/px · 7 of 36 slices shown]
[im 1/36]
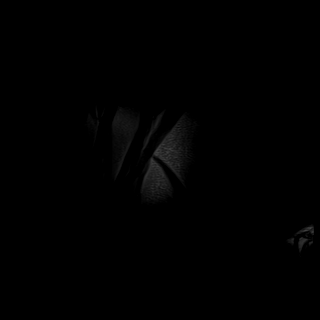
[im 6/36]
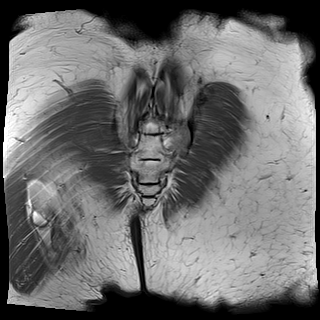
[im 12/36]
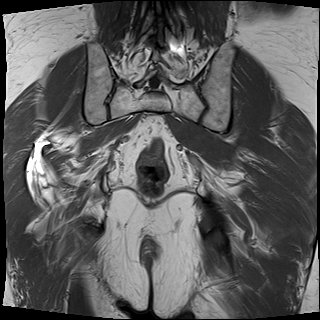
[im 18/36]
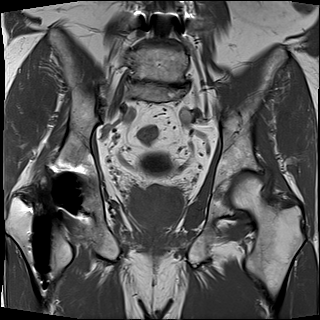
[im 24/36]
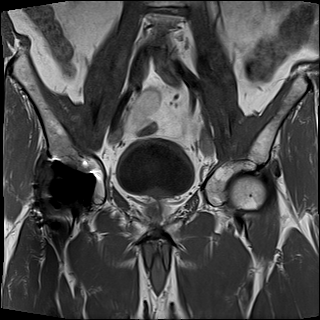
[im 30/36]
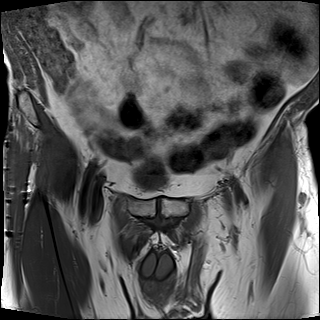
[im 36/36]
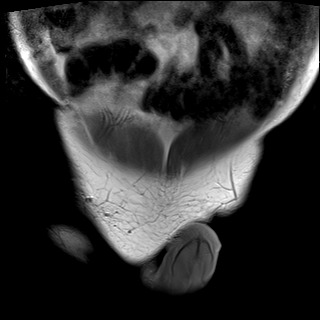

[Series 5: T2 fat-sat · sagittal · 4.0mm · 0.69mm/px · 9 of 43 slices shown (1 of 2)]
[im 1/43]
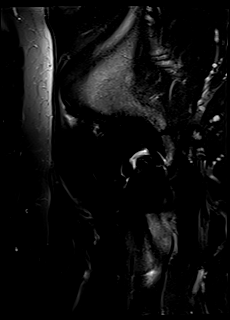
[im 6/43]
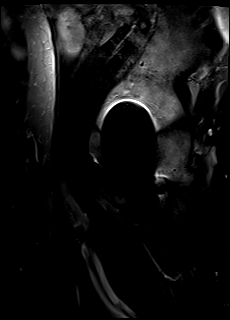
[im 11/43]
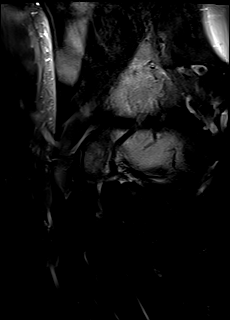
[im 16/43]
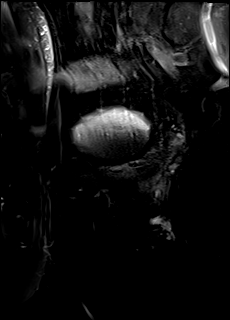
[im 22/43]
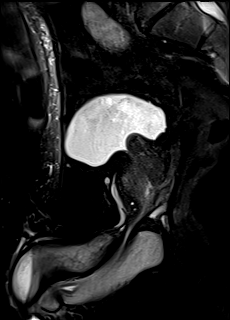
[im 27/43]
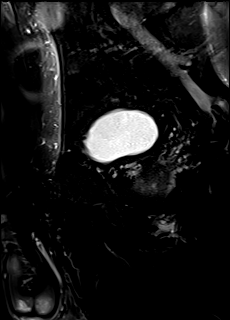
[im 32/43]
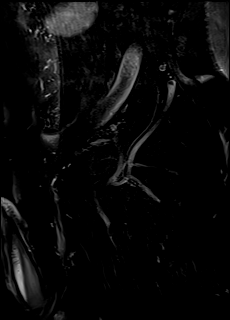
[im 37/43]
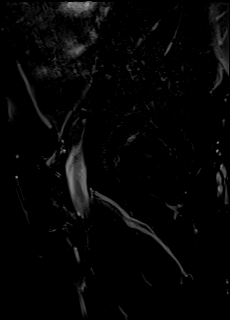
[im 43/43]
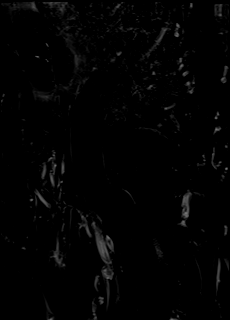

[Series 6: T2 fat-sat · axial · 4.0mm · 1.00mm/px · z∈[-190,+10]mm · 8 of 41 slices shown (2 of 2)]
[im 1/41]
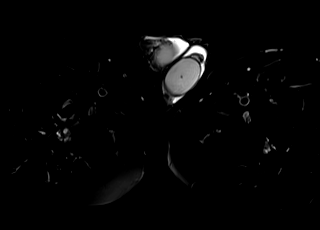
[im 6/41]
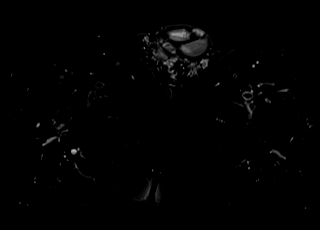
[im 12/41]
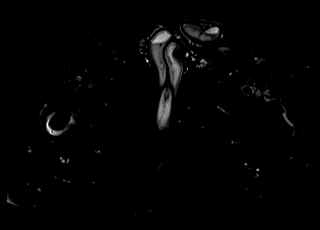
[im 18/41]
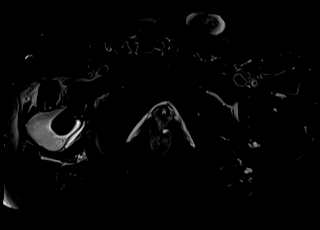
[im 23/41]
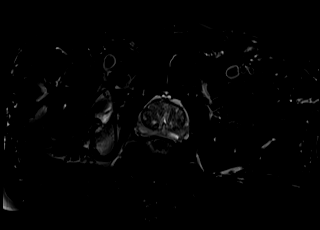
[im 29/41]
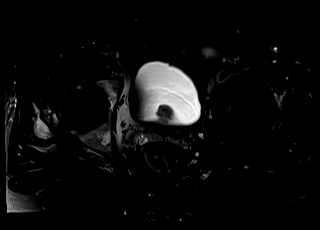
[im 35/41]
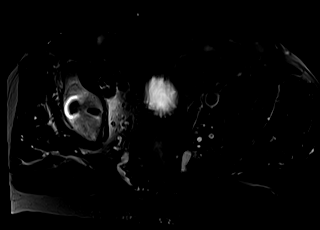
[im 41/41]
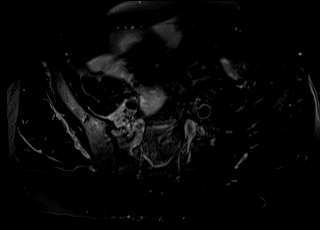

[Series 7: PD · oblique · 4.0mm · 0.62mm/px · 8 of 40 slices shown]
[im 1/40]
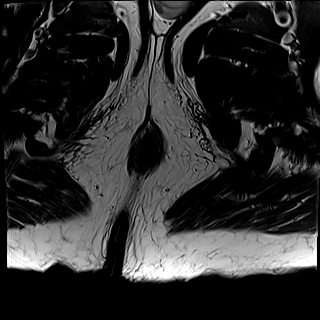
[im 6/40]
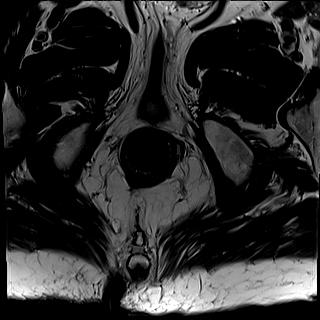
[im 12/40]
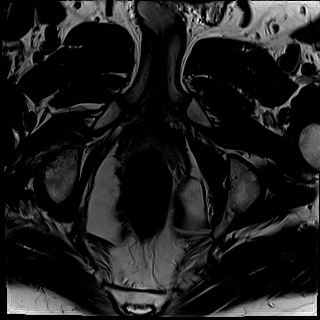
[im 17/40]
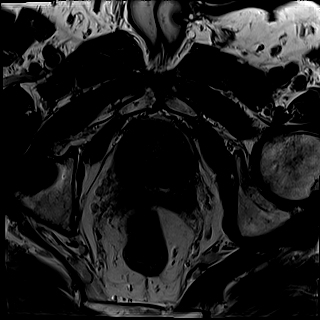
[im 23/40]
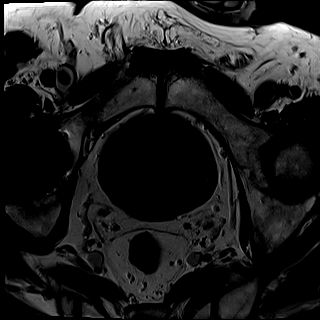
[im 28/40]
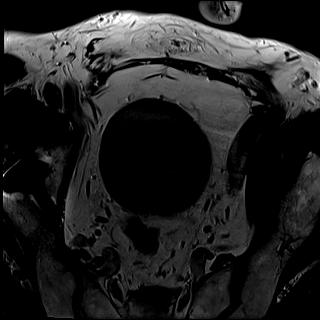
[im 34/40]
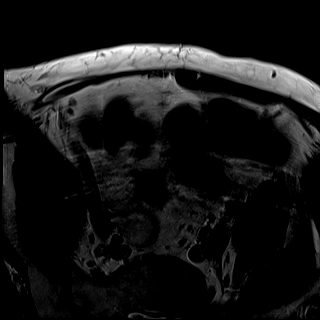
[im 40/40]
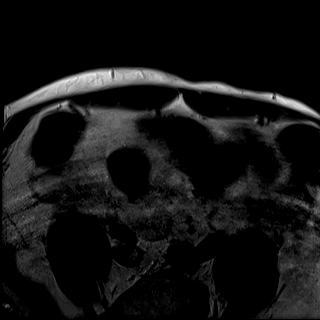

[Series 8: stir_axial_obl · oblique · 4.0mm · 0.39mm/px · 3 of 40 slices shown]
[im 1/40]
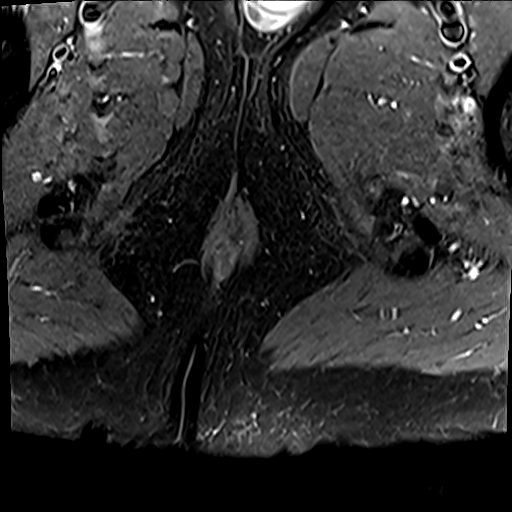
[im 6/40]
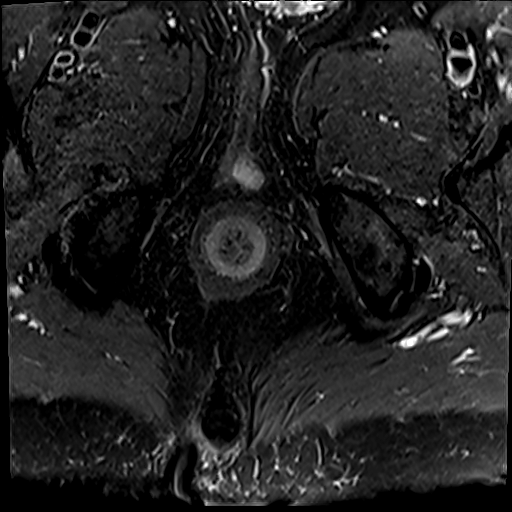
[im 12/40]
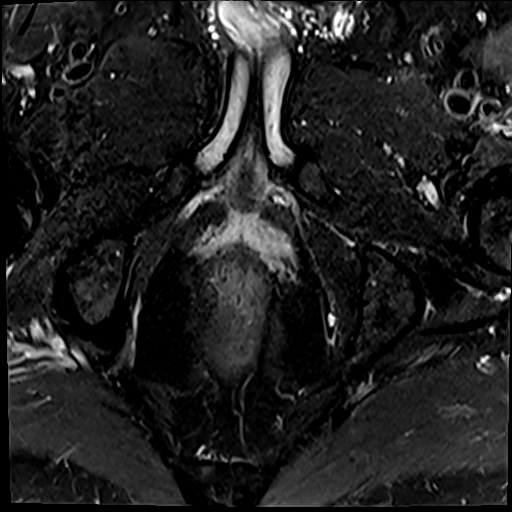

[43 of 48 positions shown; findings below may reference images not displayed]

FINDINGS: Bones: Prior right hip arthroplasty with associated susceptibility
artifact. There is no acute fracture, dislocation, or avascular
necrosis of the left hip. No suspicious bone lesion. Mild bilateral
SI joint degenerative change, left worse than right.
Mild-to-moderate arthropathy of the pubic symphysis with trace
effusion. Prior lumbar fusion and degenerative disc disease below
the fusion, see separately dictated lumbar spine MRI.

Articular cartilage and labrum

Articular cartilage: Prior right hip arthroplasty. Moderate left hip
chondrosis.

Labrum: Left-sided anterior superior labral degeneration.

Joint or bursal effusion

Joint effusion: No significant joint effusion.

Bursae: No focal periarticular fluid collection.

Muscles and tendons

Muscles and tendons: The gluteal tendons are intact. The proximal
hamstrings are intact.Decreased intramuscular edema within the hip
adductors. There are persistent mild findings of bilateral athletic
pubalgia with partial-thickness clefts at the interface of the
combined rectus abdominus-abductor longus aponeurosis and the pubic
bone, left worse than right. Mild tendinosis of the proximal
adductor tendons. There is low-grade partial tearing of the right
rectus femoris tendon origin, new from prior exam. Mild
peritendinitis at the left rectus femoris origin. Low-grade muscle
strains of the lower paraspinal musculature.

Other findings

Miscellaneous: Mildly enlarged heterogeneous prostate.
IMPRESSION: Similar findings of bilateral athletic pubalgia, left worse than
right. Decreased intramuscular edema within the adductor muscles.

Low-grade partial tearing at the right rectus femoris tendon origin,
new from prior exam.

Moderate left hip osteoarthritis.

## 2021-08-07 IMAGING — MR MR LUMBAR SPINE W/O CM
5 of 8 series · 30 of 48 positions shown · non-contrast
Comparison: Previous MRI from [DATE]

CLINICAL DATA: Initial evaluation for continued lower back pain
status post lumbar surgery.

EXAM:
MRI LUMBAR SPINE WITHOUT CONTRAST
TECHNIQUE: Multiplanar, multisequence MR imaging of the lumbar spine was
performed. No intravenous contrast was administered.

[Series 9: T2 · sagittal · 4.0mm · 0.81mm/px · 6 of 17 slices shown]
[im 1/17]
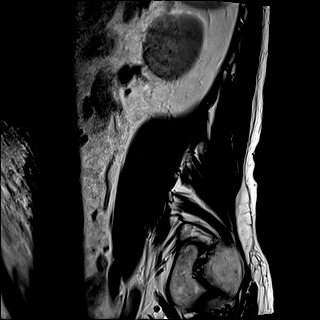
[im 4/17]
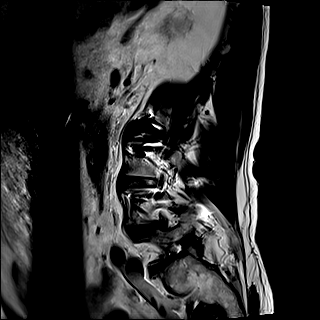
[im 7/17]
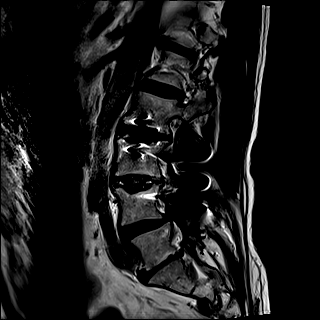
[im 10/17]
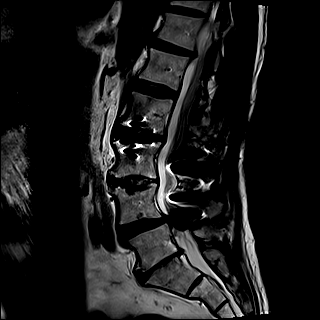
[im 13/17]
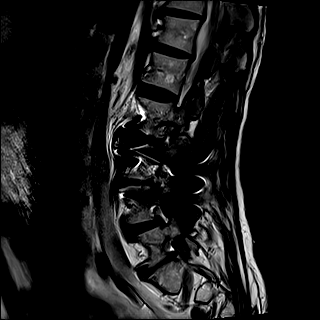
[im 17/17]
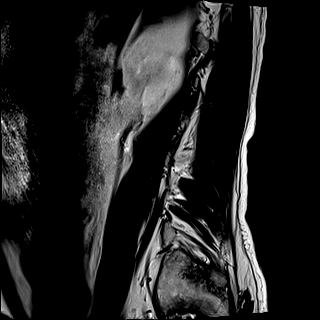

[Series 10: T1 · sagittal · 4.0mm · 0.81mm/px · 6 of 17 slices shown]
[im 1/17]
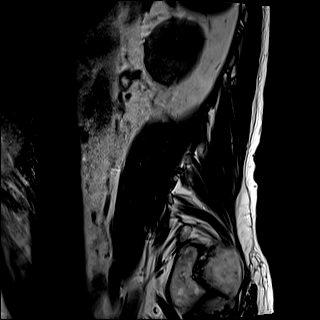
[im 4/17]
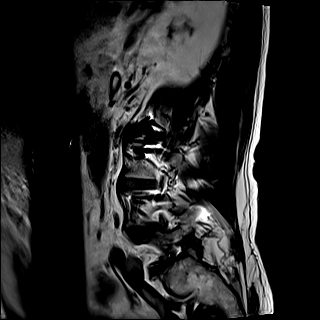
[im 7/17]
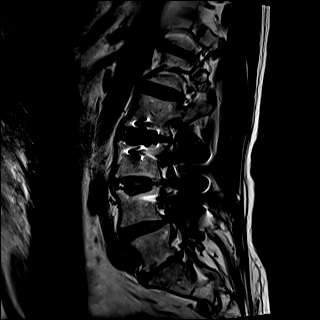
[im 10/17]
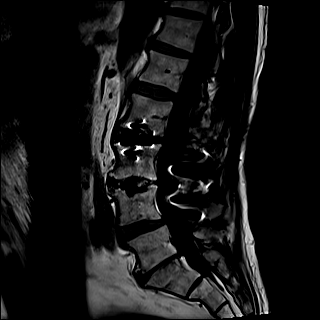
[im 13/17]
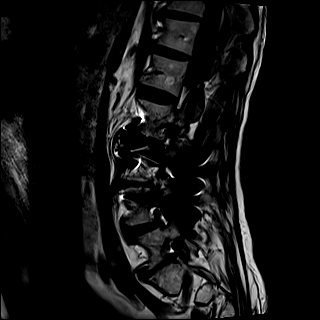
[im 17/17]
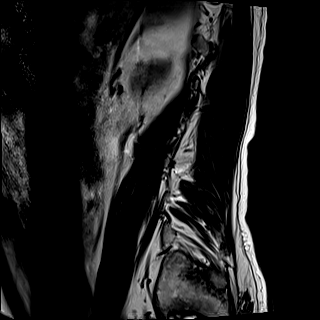

[Series 11: STIR · sagittal · 4.0mm · 0.51mm/px · 6 of 17 slices shown]
[im 1/17]
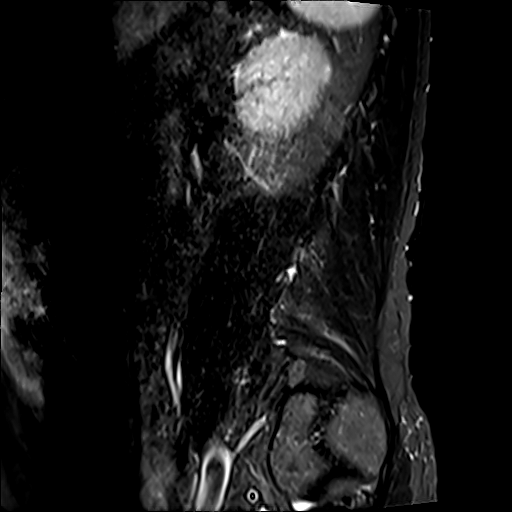
[im 4/17]
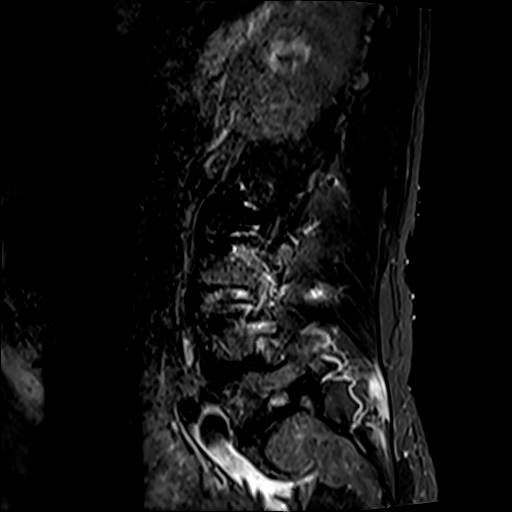
[im 7/17]
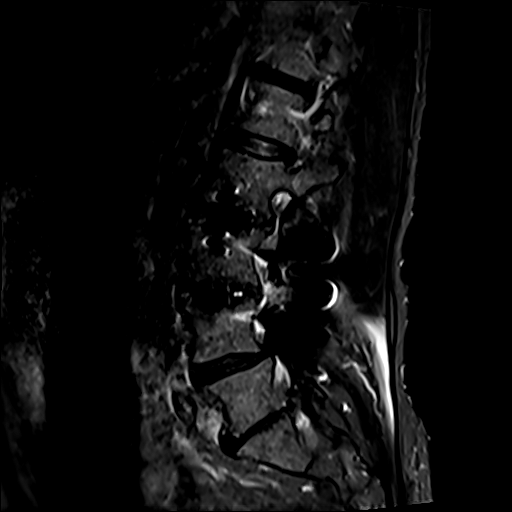
[im 10/17]
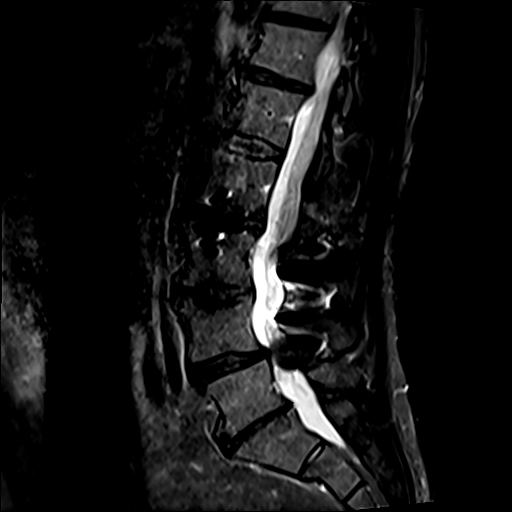
[im 13/17]
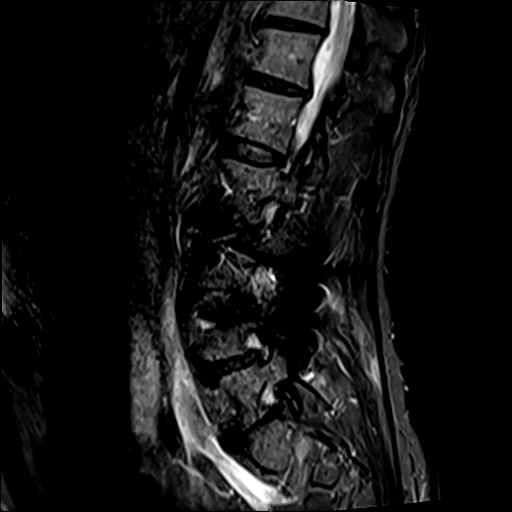
[im 17/17]
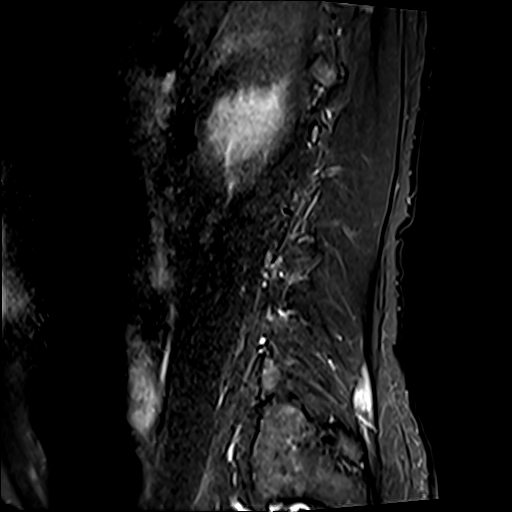

[Series 12: axial t2_upper · axial · 4.0mm · 0.62mm/px · z∈[+223,+325]mm · 6 of 20 slices shown]
[im 1/20]
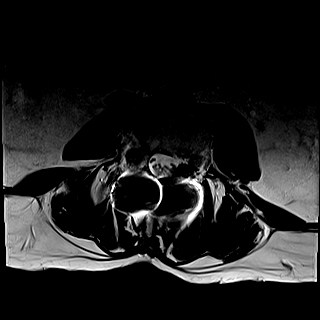
[im 4/20]
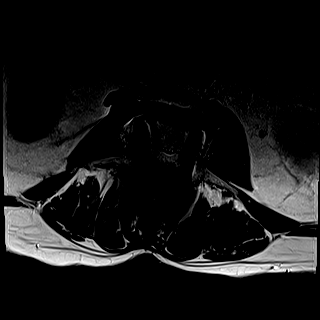
[im 8/20]
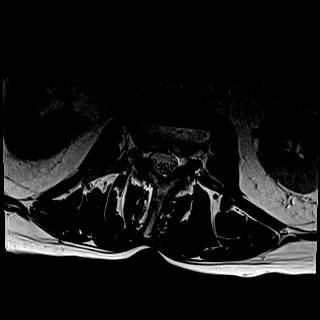
[im 12/20]
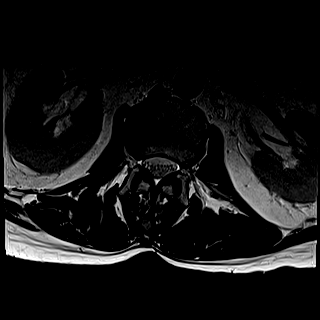
[im 16/20]
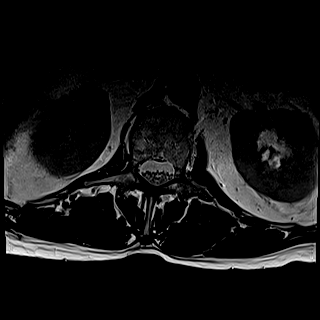
[im 20/20]
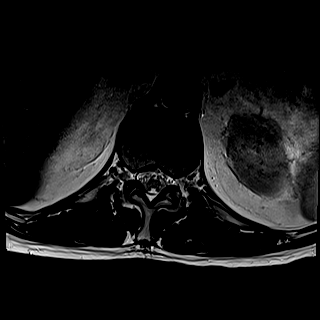

[Series 13: axial t2_lower · axial · 4.0mm · 0.62mm/px · z∈[+97,+195]mm · 6 of 20 slices shown]
[im 1/20]
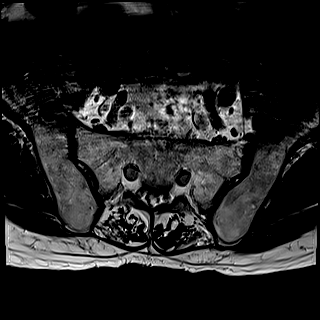
[im 4/20]
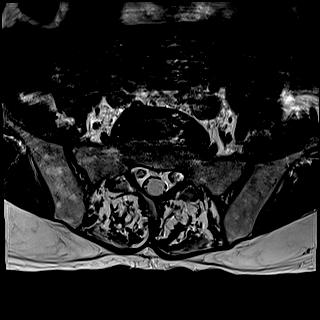
[im 8/20]
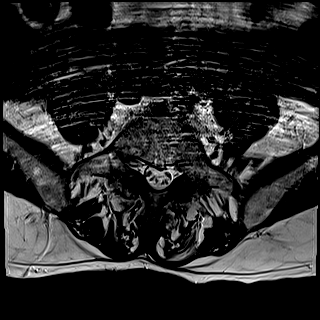
[im 12/20]
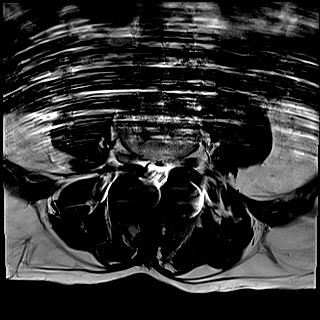
[im 16/20]
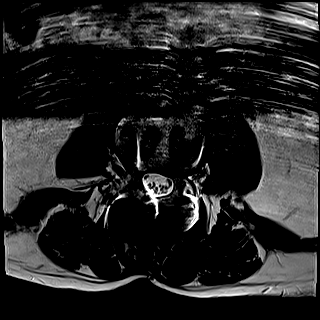
[im 20/20]
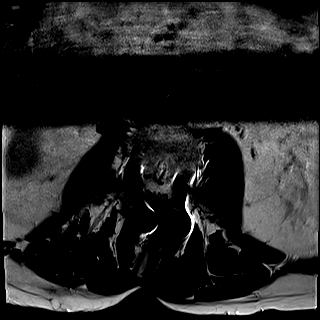

[30 of 48 positions shown; findings below may reference images not displayed]

FINDINGS: Segmentation: Standard. Lowest well-formed disc space labeled the
L5-S1 level. Same numbering system employed as on previous exam.

Alignment: Mild levoscoliosis. 3 mm anterolisthesis of L4 on L5,
with trace retrolisthesis of L5 on S1. Appearance is stable from
prior without progression.

Vertebrae: Susceptibility artifact related to prior fusion at L2
through L4. Vertebral body height maintained without acute or
interval fracture. Bone marrow signal intensity within normal
limits. Few small benign hemangiomata noted. No worrisome osseous
lesions. No abnormal marrow edema.

Conus medullaris and cauda equina: Conus extends to the T12-L1
level. Conus and cauda equina appear normal.

Paraspinal and other soft tissues: Postoperative changes noted
within the lower posterior paraspinous soft tissues. No collections
or other adverse features. Visualized visceral structures within
normal limits.

Disc levels:

T11-12: Seen only on sagittal projection. Mild disc bulge with disc
desiccation. Chronic central to right paracentral disc extrusion
with both superior and inferior migration, little interval changed
in appearance from previous (series 9, image 6). Resultant mild
spinal stenosis also similar. No frank cord impingement. Foramina
remain grossly patent.

T12-L1: Right subarticular disc protrusion indents the right ventral
thecal sac (series 12, image 2). Mild facet and ligament flavum
hypertrophy. Resultant mild spinal stenosis. Foramina remain patent.
Appearance is not significantly changed.

L1-2: Mild disc desiccation without disc bulge. No canal or
foraminal stenosis. Appearance is stable.

L2-3: Postoperative changes from prior interbody and right lateral
fusion with posterior decompression. Residual bilateral facet
hypertrophy. Stable thecal sac patency without significant residual
spinal stenosis. Residual endplate spurring with resultant mild
right worse than left L2 foraminal stenosis, unchanged.

L3-4: Prior posterior and interbody fusion with posterior
decompression. No residual canal or foraminal stenosis. Appearance
is stable.

L4-5: 3 mm anterolisthesis. Circumferential disc bulge with disc
desiccation and mild intervertebral disc space narrowing. Moderate
to severe bilateral facet hypertrophy, slightly worse on the right.
Previously noted small right-sided synovial cyst not definitely seen
on today's exam, although this area is partially obscured by metal
artifact (series 13, image 10). Resultant severe multifactorial
spinal stenosis and bilateral lateral recess stenosis, with the
thecal sac measuring approximately 5-6 mm in AP diameter, similar to
previous. Mild bilateral L4 foraminal stenosis, similar.

L5-S1: Advanced degenerative intervertebral disc space narrowing
with trace chronic retrolisthesis. Diffuse disc bulge with disc
desiccation and reactive endplate spurring. Resultant
circumferential disc osteophyte complex closely approximates the S1
nerve roots without impingement or displacement. Mild bilateral
facet hypertrophy. No significant canal or lateral recess stenosis.
Mild to moderate bilateral L5 foraminal stenosis, stable.

Overall, appearance of the lumbar spine is little interval changed
as compared to previous exam.
IMPRESSION: 1. Overall little interval change in appearance of the lumbar spine
as compared to [DATE].
2. Postoperative changes from prior fusion and decompression at L2-3
and L3-4 without residual or recurrent spinal stenosis.
3. Adjacent segment disease with multifactorial degenerative changes
at L4-5 with resultant severe canal and bilateral lateral recess
stenosis, similar to previous. Previously seen small right-sided
synovial cyst is not visualized, although this area is partially
obscured by metal artifact on today's exam.
4. Chronic degenerative disc osteophyte complex at L5-S1 with
resultant mild to moderate bilateral L5 foraminal stenosis, stable.
5. Chronic lower thoracic disc herniations with mild spinal stenosis
at T11-12 and T12-L1, stable.

## 2021-08-07 NOTE — Progress Notes (Signed)
Surgical Instructions    Your procedure is scheduled on Tuesday, December 6th.  Report to [facility] Main Entrance "A" at 10:00 A.M., then check in with the Admitting office.  Call this number if you have problems the morning of surgery:  [phone]   If you have any questions prior to your surgery date call [phone]: Open Monday-Friday 8am-4pm    Remember:  Do not eat after midnight the night before your surgery  You may drink clear liquids until 9:00 AM the morning of your surgery.   Clear liquids allowed are: Water, Non-Citrus Juices (without pulp), Carbonated Beverages, Clear Tea, Black Coffee ONLY (NO MILK, CREAM OR POWDERED CREAMER of any kind), and Gatorade    Take these medicines the morning of surgery with A SIP OF WATER Prednisone Simvastatin (Zocor)  If needed: Tylenol Zyrtec    As of today, STOP taking any Aspirin (unless otherwise instructed by your surgeon) Aleve, Naproxen, Ibuprofen, Motrin, Advil, Goody's, BC's, all herbal medications, fish oil, and all vitamins.   After your COVID test   You are not required to quarantine however you are required to wear a well-fitting mask when you are out and around people not in your household.  If your mask becomes wet or soiled, replace with a new one.  Wash your hands often with soap and water for 20 seconds or clean your hands with an alcohol-based hand sanitizer that contains at least 60% alcohol.  Do not share personal items.  Notify your provider: if you are in close contact with someone who has COVID  or if you develop a fever of 100.4 or greater, sneezing, cough, sore throat, shortness of breath or body aches.   DAY OF SURGERY:         Do not wear jewelry  Do not wear lotions, powders, colognes, or deodorant. Men may shave face and neck. Do not bring valuables to the hospital.             [facility] is not responsible for any belongings or valuables.  Do NOT Smoke (Tobacco/Vaping)  24 hours prior  to your procedure  If you use a CPAP at night, you may bring your mask for your overnight stay.   Contacts, glasses, hearing aids, dentures or partials may not be worn into surgery, please bring cases for these belongings   For patients admitted to the hospital, discharge time will be determined by your treatment team.   Patients discharged the day of surgery will not be allowed to drive home, and someone needs to stay with them for 24 hours.  NO VISITORS WILL BE ALLOWED IN PRE-OP WHERE PATIENTS ARE PREPPED FOR SURGERY.  ONLY 1 SUPPORT PERSON MAY BE PRESENT IN THE WAITING ROOM WHILE YOU ARE IN SURGERY.  IF YOU ARE TO BE ADMITTED, ONCE YOU ARE IN YOUR ROOM YOU WILL BE ALLOWED TWO (2) VISITORS. 1 (ONE) VISITOR MAY STAY OVERNIGHT BUT MUST ARRIVE TO THE ROOM BY 8pm.  Minor children may have two parents present. Special consideration for safety and communication needs will be reviewed on a case by case basis.  Special instructions:    Oral Hygiene is also important to reduce your risk of infection.  Remember - BRUSH YOUR TEETH THE MORNING OF SURGERY WITH YOUR REGULAR TOOTHPASTE   [place]- Preparing For Surgery  Before surgery, you can play an important role. Because skin is not sterile, your skin needs to be as free of germs as possible. You can reduce the  number of germs on your skin by washing with CHG (chlorahexidine gluconate) Soap before surgery.  CHG is an antiseptic cleaner which kills germs and bonds with the skin to continue killing germs even after washing.     Please do not use if you have an allergy to CHG or antibacterial soaps. If your skin becomes reddened/irritated stop using the CHG.  Do not shave (including legs and underarms) for at least 48 hours prior to first CHG shower. It is OK to shave your face.  Please follow these instructions carefully.     Shower the NIGHT BEFORE SURGERY and the MORNING OF SURGERY with CHG Soap.   If you chose to wash your hair, wash your  hair first as usual with your normal shampoo. After you shampoo, rinse your hair and body thoroughly to remove the shampoo.  Then [employer] and genitals (private parts) with your normal soap and rinse thoroughly to remove soap.  After that Use CHG Soap as you would any other liquid soap. You can apply CHG directly to the skin and wash gently with a scrungie or a clean washcloth.   Apply the CHG Soap to your body ONLY FROM THE NECK DOWN.  Do not use on open wounds or open sores. Avoid contact with your eyes, ears, mouth and genitals (private parts). Wash Face and genitals (private parts)  with your normal soap.   Wash thoroughly, paying special attention to the area where your surgery will be performed.  Thoroughly rinse your body with warm water from the neck down.  DO NOT shower/wash with your normal soap after using and rinsing off the CHG Soap.  Pat yourself dry with a CLEAN TOWEL.  Wear CLEAN PAJAMAS to bed the night before surgery  Place CLEAN SHEETS on your bed the night before your surgery  DO NOT SLEEP WITH PETS.   Day of Surgery:  Take a shower with CHG soap. Wear Clean/Comfortable clothing the morning of surgery Do not apply any deodorants/lotions.   Remember to brush your teeth WITH YOUR REGULAR TOOTHPASTE.   Please read over the following fact sheets that you were given.

## 2021-08-08 ENCOUNTER — Other Ambulatory Visit: Payer: Self-pay

## 2021-08-08 ENCOUNTER — Encounter (HOSPITAL_COMMUNITY): Payer: Self-pay

## 2021-08-08 ENCOUNTER — Encounter (HOSPITAL_COMMUNITY)
Admission: RE | Admit: 2021-08-08 | Discharge: 2021-08-08 | Disposition: A | Discharge status: Home / Self Care | Payer: Medicare Other | Source: Ambulatory Visit | Attending: Neurological Surgery | Admitting: Neurological Surgery

## 2021-08-08 VITALS — BP 145/85 | HR 97 | Temp 98.3°F | Resp 17 | Ht 68.5 in | Wt 170.7 lb

## 2021-08-08 DIAGNOSIS — I480 Paroxysmal atrial fibrillation: Secondary | ICD-10-CM | POA: Insufficient documentation

## 2021-08-08 DIAGNOSIS — I10 Essential (primary) hypertension: Secondary | ICD-10-CM | POA: Diagnosis not present

## 2021-08-08 DIAGNOSIS — I2510 Atherosclerotic heart disease of native coronary artery without angina pectoris: Secondary | ICD-10-CM

## 2021-08-08 DIAGNOSIS — R7303 Prediabetes: Secondary | ICD-10-CM | POA: Insufficient documentation

## 2021-08-08 DIAGNOSIS — Z01818 Encounter for other preprocedural examination: Secondary | ICD-10-CM | POA: Insufficient documentation

## 2021-08-08 DIAGNOSIS — I350 Nonrheumatic aortic (valve) stenosis: Secondary | ICD-10-CM | POA: Diagnosis not present

## 2021-08-08 DIAGNOSIS — E785 Hyperlipidemia, unspecified: Secondary | ICD-10-CM | POA: Diagnosis not present

## 2021-08-08 LAB — CBC
HCT: 42.5 % (ref 39.0–52.0)
Hemoglobin: 14 g/dL (ref 13.0–17.0)
MCH: 28.7 pg (ref 26.0–34.0)
MCHC: 32.9 g/dL (ref 30.0–36.0)
MCV: 87.3 fL (ref 80.0–100.0)
Platelets: 220 10*3/uL (ref 150–400)
RBC: 4.87 MIL/uL (ref 4.22–5.81)
RDW: 13.4 % (ref 11.5–15.5)
WBC: 7.5 10*3/uL (ref 4.0–10.5)
nRBC: 0 % (ref 0.0–0.2)

## 2021-08-08 LAB — TYPE AND SCREEN
ABO/RH(D): B POS
Antibody Screen: NEGATIVE

## 2021-08-08 LAB — SURGICAL PCR SCREEN
MRSA, PCR: NEGATIVE
Staphylococcus aureus: NEGATIVE

## 2021-08-08 LAB — BASIC METABOLIC PANEL
Anion gap: 8 (ref 5–15)
BUN: 22 mg/dL (ref 8–23)
CO2: 23 mmol/L (ref 22–32)
Calcium: 9.1 mg/dL (ref 8.9–10.3)
Chloride: 107 mmol/L (ref 98–111)
Creatinine, Ser: 0.88 mg/dL (ref 0.61–1.24)
GFR, Estimated: >60 mL/min (ref >60)
Glucose, Bld: 112 mg/dL — ABNORMAL HIGH (ref 70–99)
Potassium: 4 mmol/L (ref 3.5–5.1)
Sodium: 138 mmol/L (ref 135–145)

## 2021-08-08 LAB — GLUCOSE, CAPILLARY: Glucose-Capillary: 133 mg/dL — ABNORMAL HIGH (ref 70–99)

## 2021-08-08 NOTE — Progress Notes (Signed)
PCP - Dr. Jani Gravel Cardiologist - Dr. Sherren Mocha- clearance?  PPM/ICD - denies   Chest x-ray - 02/04/1999 EKG - 08/08/21 at PAT Stress Test - 12/31/17 ECHO - 02/01/21 Cardiac Cath - denies  Sleep Study - denies  DM- Pre-diabetic Pt does not check CBG  Blood Thinner Instructions: n/a Aspirin Instructions: n/a  ERAS Protcol - yes, no drink   COVID TEST- pt scheduled for testing on 12/2   Anesthesia review: yes, cardiac hx  Patient denies shortness of breath, fever, cough and chest pain at PAT appointment   All instructions explained to the patient, with a verbal understanding of the material. Patient agrees to go over the instructions while at home for a better understanding. Patient also instructed to wear a mask in public after being tested for COVID-19. The opportunity to ask questions was provided.

## 2021-08-09 LAB — HEMOGLOBIN A1C
Hgb A1c MFr Bld: 6.3 % — ABNORMAL HIGH (ref 4.8–5.6)
Mean Plasma Glucose: 134 mg/dL

## 2021-08-09 NOTE — Anesthesia Preprocedure Evaluation (Addendum)
Anesthesia Evaluation  Patient identified by MRN, date of birth, ID band Patient awake    Reviewed: Allergy & Precautions, NPO status , Patient's Chart, lab work & pertinent test results  Airway Mallampati: II  TM Distance: >3 FB Neck ROM: Full    Dental  (+) Teeth Intact, Dental Advisory Given   Pulmonary former smoker,    Pulmonary exam normal breath sounds clear to auscultation       Cardiovascular hypertension, Pt. on medications (-) angina+ CAD  Normal cardiovascular exam+ dysrhythmias Atrial Fibrillation + Valvular Problems/Murmurs AS  Rhythm:Regular Rate:Normal  Echo 02/01/21: 1. Left ventricular ejection fraction, by estimation, is 60 to 65%. The  left ventricle has normal function. The left ventricle has no regional  wall motion abnormalities. There is moderate left ventricular hypertrophy  of the basal-septal segment. Left  ventricular diastolic parameters were normal.  2. Right ventricular systolic function is normal. The right ventricular  size is normal. Tricuspid regurgitation signal is inadequate for assessing  PA pressure.  3. The mitral valve is normal in structure. Trivial mitral valve  regurgitation. No evidence of mitral stenosis.  4. The aortic valve is calcified. Aortic valve regurgitation is mild.  Moderate aortic valve stenosis. Aortic valve area, by VTI measures 1.10  cm. Aortic valve mean gradient measures 20.0 mmHg. Aortic valve Vmax  measures 2.87 m/s.   Compared to echo of 07/2020, there is no significasnnt change. Mean  AVG 101mmHg, AVA 1.06cm2 and DI 0.27 in 07/2020    Neuro/Psych Meniere's disease: ENT requesting D5-0.45NS  Neuromuscular disease    GI/Hepatic negative GI ROS, Neg liver ROS,   Endo/Other  negative endocrine ROS  Renal/GU negative Renal ROS     Musculoskeletal  (+) Arthritis , Osteoarthritis,    Abdominal   Peds  Hematology negative hematology ROS (+)    Anesthesia Other Findings   Reproductive/Obstetrics                           Anesthesia Physical Anesthesia Plan  ASA: 3  Anesthesia Plan: General   Post-op Pain Management: Tylenol PO (pre-op)   Induction: Intravenous  PONV Risk Score and Plan: 2 and Dexamethasone and Ondansetron  Airway Management Planned: Oral ETT  Additional Equipment:   Intra-op Plan:   Post-operative Plan: Extubation in OR  Informed Consent: I have reviewed the patients History and Physical, chart, labs and discussed the procedure including the risks, benefits and alternatives for the proposed anesthesia with the patient or authorized representative who has indicated his/her understanding and acceptance.     Dental advisory given  Plan Discussed with: CRNA  Anesthesia Plan Comments: (CLEAR SIGHT, 2nd PIV after induction  PAT note by Karoline Caldwell, PA-C: Follows with cardiology for history of moderate aortic stenosis (mean gradient 20 mmHg by echo 01/2021), paroxysmal atrial fibrillation, coronary artery calcification, HTN, HLD.  Last seen by Dr. Burt Knack 02/01/2021.  At that time his moderate AS was noted to be stable.  He has had no symptoms of atrial fibrillation for several years and an event monitor October 2021 demonstrated no evidence of A. fib.  He was previously on Xarelto.  He self discontinued due to extensive bruising.  He was advised to follow-up in 1 year for monitoring of AS.  He has been previously noted to have good functional status with regular swimming and tennis playing.  Preop labs reviewed, unremarkable.  EKG 08/08/2021: Sinus rhythm with marked sinus arrhythmia.  Rate 89.  TTE  02/01/2021: 1. Left ventricular ejection fraction, by estimation, is 60 to 65%. The  left ventricle has normal function. The left ventricle has no regional  wall motion abnormalities. There is moderate left ventricular hypertrophy  of the basal-septal segment. Left  ventricular  diastolic parameters were normal.  2. Right ventricular systolic function is normal. The right ventricular  size is normal. Tricuspid regurgitation signal is inadequate for assessing  PA pressure.  3. The mitral valve is normal in structure. Trivial mitral valve  regurgitation. No evidence of mitral stenosis.  4. The aortic valve is calcified. Aortic valve regurgitation is mild.  Moderate aortic valve stenosis. Aortic valve area, by VTI measures 1.10  cm. Aortic valve mean gradient measures 20.0 mmHg. Aortic valve Vmax  measures 2.87 m/s.   Compared to echo of 07/2020, there is no significasnnt change. Mean  AVG 64mmHg, AVA 1.06cm2 and DI 0.27 in 07/2020   Exercise tolerance test 12/31/2017: ETT with good exercise tolerance (10:00); no chest pain; normal BP response; no diagnostic ST changes; negative adequate ETT. )      Anesthesia Quick Evaluation

## 2021-08-09 NOTE — Progress Notes (Signed)
Anesthesia Chart Review:  Follows with cardiology for history of moderate aortic stenosis (mean gradient 20 mmHg by echo 01/2021), paroxysmal atrial fibrillation, coronary artery calcification, HTN, HLD.  Last seen by Dr. Burt Knack 02/01/2021.  At that time his moderate AS was noted to be stable.  He has had no symptoms of atrial fibrillation for several years and an event monitor October 2021 demonstrated no evidence of A. fib.  He was previously on Xarelto.  He self discontinued due to extensive bruising.  He was advised to follow-up in 1 year for monitoring of AS.  He has been previously noted to have good functional status with regular swimming and tennis playing.  Preop labs reviewed, unremarkable.  EKG 08/08/2021: Sinus rhythm with marked sinus arrhythmia.  Rate 89.  TTE 02/01/2021:  1. Left ventricular ejection fraction, by estimation, is 60 to 65%. The  left ventricle has normal function. The left ventricle has no regional  wall motion abnormalities. There is moderate left ventricular hypertrophy  of the basal-septal segment. Left  ventricular diastolic parameters were normal.   2. Right ventricular systolic function is normal. The right ventricular  size is normal. Tricuspid regurgitation signal is inadequate for assessing  PA pressure.   3. The mitral valve is normal in structure. Trivial mitral valve  regurgitation. No evidence of mitral stenosis.   4. The aortic valve is calcified. Aortic valve regurgitation is mild.  Moderate aortic valve stenosis. Aortic valve area, by VTI measures 1.10  cm. Aortic valve mean gradient measures 20.0 mmHg. Aortic valve Vmax  measures 2.87 m/s.      Compared to echo of 07/2020, there is no significasnnt change. Mean  AVG 33mmHg, AVA 1.06cm2 and DI 0.27 in 07/2020   Exercise tolerance test 12/31/2017: ETT with good exercise tolerance (10:00); no chest pain; normal BP response; no diagnostic ST changes; negative adequate ETT.    Wynonia Musty [facility] Short Stay Center/Anesthesiology Phone [phone] 08/09/2021 12:25 PM

## 2021-08-10 ENCOUNTER — Other Ambulatory Visit (HOSPITAL_COMMUNITY)
Admission: RE | Admit: 2021-08-10 | Discharge: 2021-08-10 | Disposition: A | Discharge status: Home / Self Care | Payer: Medicare Other | Source: Ambulatory Visit | Attending: Neurological Surgery | Admitting: Neurological Surgery

## 2021-08-10 DIAGNOSIS — Z20822 Contact with and (suspected) exposure to covid-19: Secondary | ICD-10-CM | POA: Insufficient documentation

## 2021-08-10 DIAGNOSIS — Z01812 Encounter for preprocedural laboratory examination: Secondary | ICD-10-CM | POA: Insufficient documentation

## 2021-08-10 DIAGNOSIS — Z01818 Encounter for other preprocedural examination: Secondary | ICD-10-CM

## 2021-08-10 LAB — SARS CORONAVIRUS 2 (TAT 6-24 HRS): SARS Coronavirus 2: NEGATIVE

## 2021-08-10 NOTE — Progress Notes (Signed)
Patient presented for pre-op covid testing. Patient stated in prior recent surgery at other facility he had an exacerbation of his Meniere's  and related it to the normal saline concentration elevating his sodium levels. Shared recommendations from his ENT DR Thornell Mule regarding IV fluid concentration to be reduced to D5.45 NS. Copy of this recommendation placed in his chart.

## 2021-08-13 DIAGNOSIS — H8103 Meniere's disease, bilateral: Secondary | ICD-10-CM | POA: Diagnosis not present

## 2021-08-13 NOTE — Progress Notes (Signed)
Patient voiced understanding of new arrival time of 0530 tomorrow. °

## 2021-08-14 ENCOUNTER — Encounter (HOSPITAL_COMMUNITY): Payer: Self-pay | Admitting: Neurological Surgery

## 2021-08-14 ENCOUNTER — Inpatient Hospital Stay (HOSPITAL_COMMUNITY): Payer: Medicare Other | Admitting: Physician Assistant

## 2021-08-14 ENCOUNTER — Inpatient Hospital Stay (HOSPITAL_COMMUNITY)
Admission: RE | Admit: 2021-08-14 | Discharge: 2021-08-15 | DRG: 460 | Disposition: A | Discharge status: Home / Self Care | Payer: Medicare Other | Attending: Neurological Surgery | Admitting: Neurological Surgery

## 2021-08-14 ENCOUNTER — Inpatient Hospital Stay (HOSPITAL_COMMUNITY)
Admission: RE | Disposition: A | Discharge status: Home / Self Care | Payer: Self-pay | Source: Home / Self Care | Attending: Neurological Surgery

## 2021-08-14 ENCOUNTER — Inpatient Hospital Stay (HOSPITAL_COMMUNITY): Payer: Medicare Other | Admitting: Certified Registered"

## 2021-08-14 ENCOUNTER — Inpatient Hospital Stay (HOSPITAL_COMMUNITY): Payer: Medicare Other

## 2021-08-14 DIAGNOSIS — M4326 Fusion of spine, lumbar region: Secondary | ICD-10-CM | POA: Diagnosis not present

## 2021-08-14 DIAGNOSIS — M48062 Spinal stenosis, lumbar region with neurogenic claudication: Secondary | ICD-10-CM | POA: Diagnosis not present

## 2021-08-14 DIAGNOSIS — I2510 Atherosclerotic heart disease of native coronary artery without angina pectoris: Secondary | ICD-10-CM | POA: Diagnosis present

## 2021-08-14 DIAGNOSIS — Z87891 Personal history of nicotine dependence: Secondary | ICD-10-CM

## 2021-08-14 DIAGNOSIS — Z7952 Long term (current) use of systemic steroids: Secondary | ICD-10-CM | POA: Diagnosis not present

## 2021-08-14 DIAGNOSIS — I350 Nonrheumatic aortic (valve) stenosis: Secondary | ICD-10-CM | POA: Diagnosis present

## 2021-08-14 DIAGNOSIS — Z79899 Other long term (current) drug therapy: Secondary | ICD-10-CM | POA: Diagnosis not present

## 2021-08-14 DIAGNOSIS — M4727 Other spondylosis with radiculopathy, lumbosacral region: Secondary | ICD-10-CM | POA: Diagnosis not present

## 2021-08-14 DIAGNOSIS — I480 Paroxysmal atrial fibrillation: Secondary | ICD-10-CM | POA: Diagnosis present

## 2021-08-14 DIAGNOSIS — Z8249 Family history of ischemic heart disease and other diseases of the circulatory system: Secondary | ICD-10-CM

## 2021-08-14 DIAGNOSIS — E7800 Pure hypercholesterolemia, unspecified: Secondary | ICD-10-CM | POA: Diagnosis present

## 2021-08-14 DIAGNOSIS — M4726 Other spondylosis with radiculopathy, lumbar region: Secondary | ICD-10-CM | POA: Diagnosis present

## 2021-08-14 DIAGNOSIS — J301 Allergic rhinitis due to pollen: Secondary | ICD-10-CM | POA: Diagnosis present

## 2021-08-14 DIAGNOSIS — Z981 Arthrodesis status: Secondary | ICD-10-CM | POA: Diagnosis not present

## 2021-08-14 DIAGNOSIS — M4807 Spinal stenosis, lumbosacral region: Secondary | ICD-10-CM | POA: Diagnosis not present

## 2021-08-14 DIAGNOSIS — E663 Overweight: Secondary | ICD-10-CM | POA: Diagnosis present

## 2021-08-14 DIAGNOSIS — N400 Enlarged prostate without lower urinary tract symptoms: Secondary | ICD-10-CM | POA: Diagnosis present

## 2021-08-14 DIAGNOSIS — Z419 Encounter for procedure for purposes other than remedying health state, unspecified: Secondary | ICD-10-CM

## 2021-08-14 LAB — GLUCOSE, CAPILLARY
Glucose-Capillary: 122 mg/dL — ABNORMAL HIGH (ref 70–99)
Glucose-Capillary: 281 mg/dL — ABNORMAL HIGH (ref 70–99)
Glucose-Capillary: 272 mg/dL — ABNORMAL HIGH (ref 70–99)

## 2021-08-14 IMAGING — RF DG LUMBAR SPINE 2-3V
1 series · 2 of 2 positions shown · non-contrast
Comparison: [DATE].

CLINICAL DATA: Surgical posterior fusion of L4-5 and L5-S1.

EXAM:
LUMBAR SPINE - 2-3 VIEW; DG C-ARM 1-60 MIN-NO REPORT
Radiation exposure index: 12.51 mGy.

[Series 1: run · 2 of 2 slices shown]
[im 1/2]
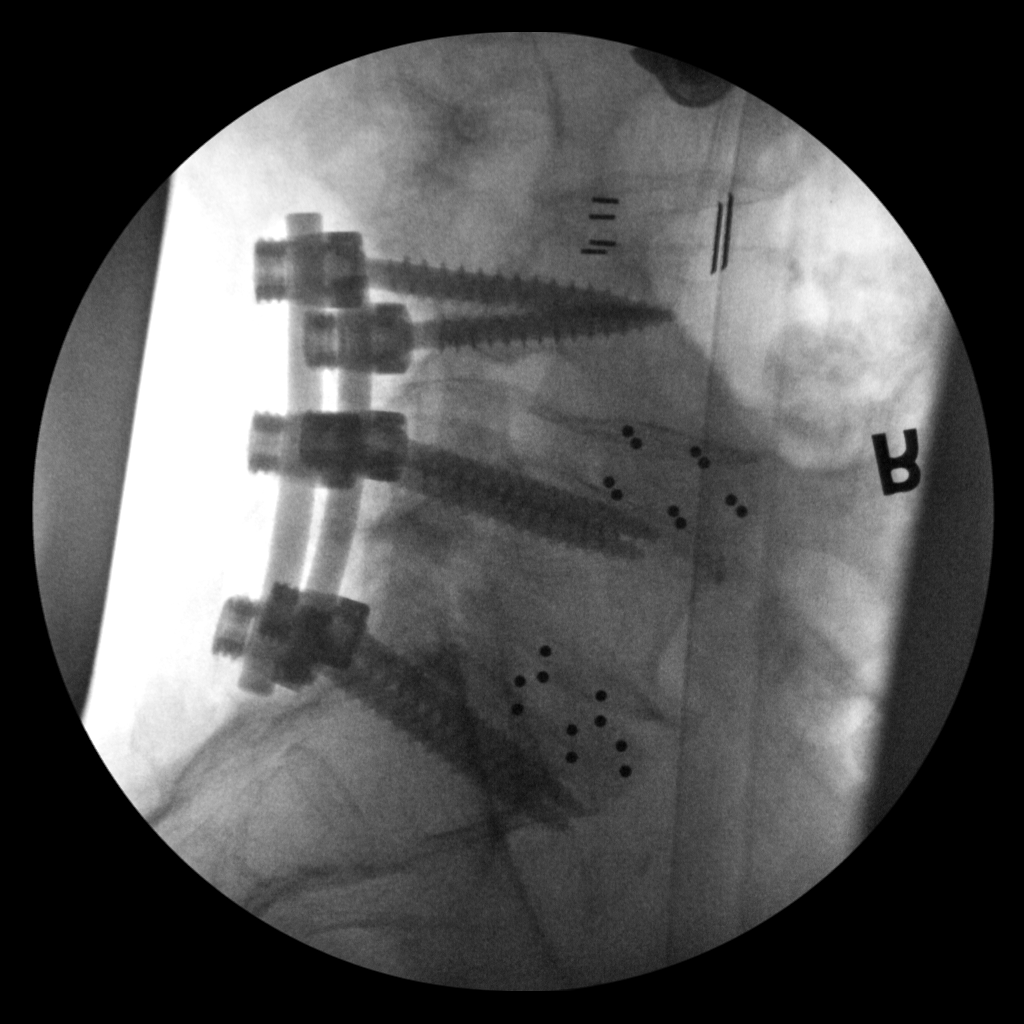
[im 2/2]
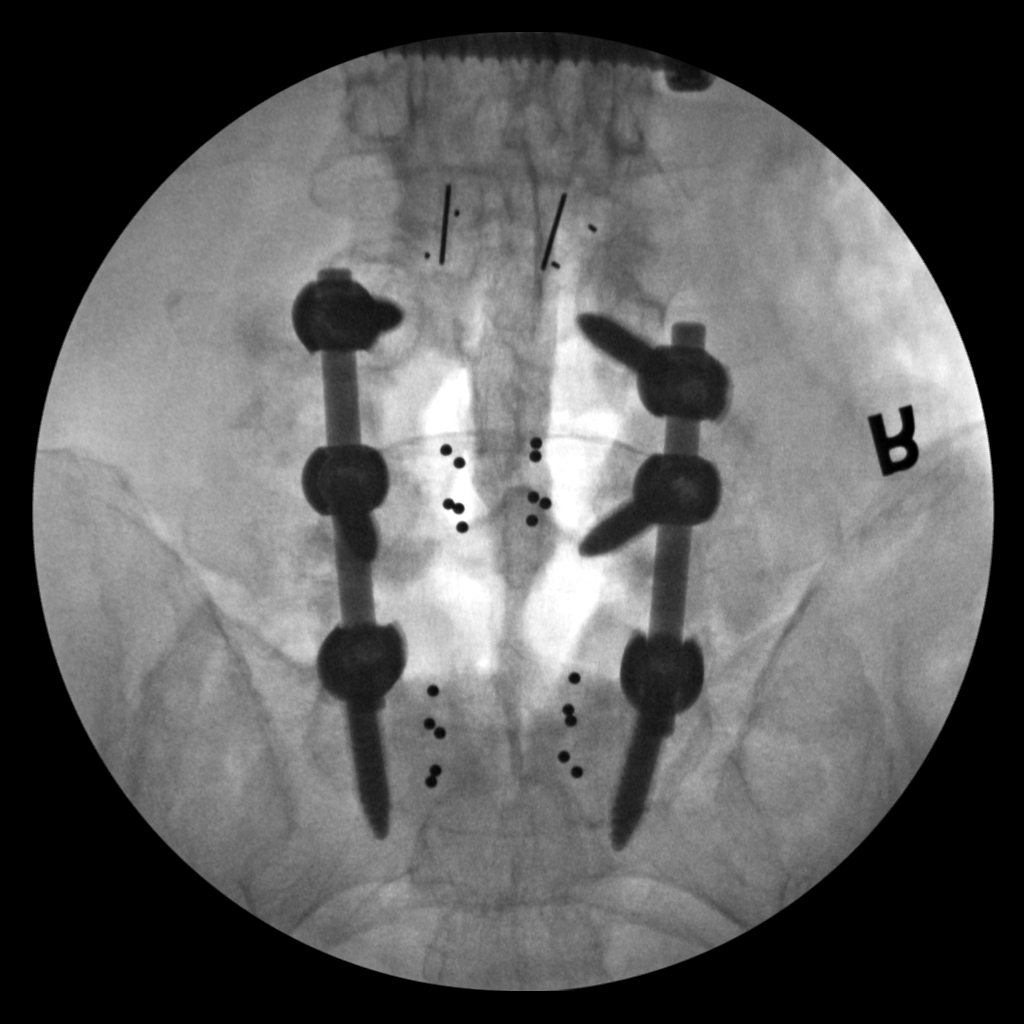

[2 of 2 positions shown; findings below may reference images not displayed]

FINDINGS: Two intraoperative fluoroscopic images were obtained of the lower
lumbar spine. These demonstrate the patient be status post surgical
posterior fusion of L4-5 and L5-S1 with bilateral in pedicular screw
placement and interbody fusion.
IMPRESSION: Fluoroscopic guidance provided during lower lumbar surgery.

## 2021-08-14 SURGERY — POSTERIOR LUMBAR FUSION 2 LEVEL
Anesthesia: General | Site: Back

## 2021-08-14 MED ORDER — MIDAZOLAM HCL 2 MG/2ML IJ SOLN
INTRAMUSCULAR | Status: AC
  Filled 2021-08-14: qty 2

## 2021-08-14 MED ORDER — ONDANSETRON HCL 4 MG/2ML IJ SOLN: 4.0000 mg | Freq: Once | INTRAMUSCULAR | Status: DC | PRN

## 2021-08-14 MED ORDER — LIDOCAINE-EPINEPHRINE 1 %-1:100000 IJ SOLN
INTRAMUSCULAR | Status: AC
  Filled 2021-08-14: qty 1

## 2021-08-14 MED ORDER — LIDOCAINE 2% (20 MG/ML) 5 ML SYRINGE
INTRAMUSCULAR | Status: AC
  Filled 2021-08-14: qty 5

## 2021-08-14 MED ORDER — CEFAZOLIN SODIUM-DEXTROSE 2-4 GM/100ML-% IV SOLN
INTRAVENOUS | Status: AC
  Filled 2021-08-14: qty 100

## 2021-08-14 MED ORDER — ONDANSETRON HCL 4 MG/2ML IJ SOLN
INTRAMUSCULAR | Status: AC
  Filled 2021-08-14: qty 2

## 2021-08-14 MED ORDER — LIDOCAINE-EPINEPHRINE 1 %-1:100000 IJ SOLN
INTRAMUSCULAR | Status: DC | PRN
  Administered 2021-08-14: 5 mL

## 2021-08-14 MED ORDER — ROCURONIUM BROMIDE 10 MG/ML (PF) SYRINGE
PREFILLED_SYRINGE | INTRAVENOUS | Status: DC | PRN
  Administered 2021-08-14: 60 mg via INTRAVENOUS
  Administered 2021-08-14: 20 mg via INTRAVENOUS
  Administered 2021-08-14: 30 mg via INTRAVENOUS
  Administered 2021-08-14: 20 mg via INTRAVENOUS
  Administered 2021-08-14: 40 mg via INTRAVENOUS

## 2021-08-14 MED ORDER — INSULIN ASPART 100 UNIT/ML IJ SOLN
INTRAMUSCULAR | Status: AC
  Filled 2021-08-14: qty 1

## 2021-08-14 MED ORDER — INSULIN ASPART 100 UNIT/ML IJ SOLN
INTRAMUSCULAR | Status: DC | PRN
  Administered 2021-08-14: 6 [IU] via SUBCUTANEOUS

## 2021-08-14 MED ORDER — ACETAMINOPHEN 325 MG PO TABS: 650.0000 mg | ORAL_TABLET | ORAL | Status: DC | PRN

## 2021-08-14 MED ORDER — PHENOL 1.4 % MT LIQD: 1.0000 | OROMUCOSAL | Status: DC | PRN

## 2021-08-14 MED ORDER — BUPIVACAINE HCL (PF) 0.5 % IJ SOLN
INTRAMUSCULAR | Status: AC
  Filled 2021-08-14: qty 30

## 2021-08-14 MED ORDER — DOCUSATE SODIUM 100 MG PO CAPS
100.0000 mg | ORAL_CAPSULE | Freq: Two times a day (BID) | ORAL | Status: DC
  Administered 2021-08-14 – 2021-08-15 (×2): 100 mg via ORAL
  Filled 2021-08-14 (×2): qty 1

## 2021-08-14 MED ORDER — ACETAMINOPHEN 500 MG PO TABS
1000.0000 mg | ORAL_TABLET | Freq: Once | ORAL | Status: AC
  Administered 2021-08-14: 1000 mg via ORAL

## 2021-08-14 MED ORDER — LIDOCAINE 2% (20 MG/ML) 5 ML SYRINGE
INTRAMUSCULAR | Status: DC | PRN
  Administered 2021-08-14: 80 mg via INTRAVENOUS

## 2021-08-14 MED ORDER — PHENYLEPHRINE 40 MCG/ML (10ML) SYRINGE FOR IV PUSH (FOR BLOOD PRESSURE SUPPORT)
PREFILLED_SYRINGE | INTRAVENOUS | Status: DC | PRN
  Administered 2021-08-14 (×2): 80 ug via INTRAVENOUS

## 2021-08-14 MED ORDER — DEXAMETHASONE SODIUM PHOSPHATE 10 MG/ML IJ SOLN
INTRAMUSCULAR | Status: AC
  Filled 2021-08-14: qty 1

## 2021-08-14 MED ORDER — MONTELUKAST SODIUM 10 MG PO TABS
10.0000 mg | ORAL_TABLET | Freq: Every day | ORAL | Status: DC
  Administered 2021-08-14: 10 mg via ORAL
  Filled 2021-08-14: qty 1

## 2021-08-14 MED ORDER — ACETAMINOPHEN 650 MG RE SUPP: 650.0000 mg | RECTAL | Status: DC | PRN

## 2021-08-14 MED ORDER — ROCURONIUM BROMIDE 10 MG/ML (PF) SYRINGE
PREFILLED_SYRINGE | INTRAVENOUS | Status: AC
  Filled 2021-08-14: qty 20

## 2021-08-14 MED ORDER — CHLORHEXIDINE GLUCONATE 0.12 % MT SOLN
15.0000 mL | Freq: Once | OROMUCOSAL | Status: AC
  Administered 2021-08-14: 15 mL via OROMUCOSAL

## 2021-08-14 MED ORDER — ORAL CARE MOUTH RINSE: 15.0000 mL | Freq: Once | OROMUCOSAL | Status: AC

## 2021-08-14 MED ORDER — ONDANSETRON HCL 4 MG PO TABS: 4.0000 mg | ORAL_TABLET | Freq: Four times a day (QID) | ORAL | Status: DC | PRN

## 2021-08-14 MED ORDER — FENTANYL CITRATE (PF) 100 MCG/2ML IJ SOLN: 25.0000 ug | INTRAMUSCULAR | Status: DC | PRN

## 2021-08-14 MED ORDER — CHLORHEXIDINE GLUCONATE CLOTH 2 % EX PADS: 6.0000 | MEDICATED_PAD | Freq: Once | CUTANEOUS | Status: DC

## 2021-08-14 MED ORDER — SIMVASTATIN 20 MG PO TABS: 40.0000 mg | ORAL_TABLET | Freq: Every day | ORAL | Status: DC

## 2021-08-14 MED ORDER — THROMBIN 20000 UNITS EX SOLR
CUTANEOUS | Status: AC
  Filled 2021-08-14: qty 20000

## 2021-08-14 MED ORDER — MIDAZOLAM HCL 2 MG/2ML IJ SOLN
INTRAMUSCULAR | Status: DC | PRN
  Administered 2021-08-14: 1 mg via INTRAVENOUS

## 2021-08-14 MED ORDER — SENNA 8.6 MG PO TABS
1.0000 | ORAL_TABLET | Freq: Two times a day (BID) | ORAL | Status: DC
  Administered 2021-08-14 – 2021-08-15 (×2): 8.6 mg via ORAL
  Filled 2021-08-14 (×2): qty 1

## 2021-08-14 MED ORDER — CEFAZOLIN SODIUM-DEXTROSE 2-4 GM/100ML-% IV SOLN
2.0000 g | INTRAVENOUS | Status: AC
  Administered 2021-08-14 (×2): 2 g via INTRAVENOUS

## 2021-08-14 MED ORDER — THROMBIN 5000 UNITS EX SOLR: OROMUCOSAL | Status: DC | PRN

## 2021-08-14 MED ORDER — PROPOFOL 10 MG/ML IV BOLUS
INTRAVENOUS | Status: DC | PRN
  Administered 2021-08-14: 120 mg via INTRAVENOUS

## 2021-08-14 MED ORDER — ALUM & MAG HYDROXIDE-SIMETH 200-200-20 MG/5ML PO SUSP: 30.0000 mL | Freq: Four times a day (QID) | ORAL | Status: DC | PRN

## 2021-08-14 MED ORDER — MORPHINE SULFATE (PF) 2 MG/ML IV SOLN: 2.0000 mg | INTRAVENOUS | Status: DC | PRN

## 2021-08-14 MED ORDER — EPHEDRINE SULFATE-NACL 50-0.9 MG/10ML-% IV SOSY
PREFILLED_SYRINGE | INTRAVENOUS | Status: DC | PRN
  Administered 2021-08-14: 200 mg via INTRAVENOUS

## 2021-08-14 MED ORDER — INSULIN ASPART 100 UNIT/ML IJ SOLN: 0.0000 [IU] | Freq: Three times a day (TID) | INTRAMUSCULAR | Status: DC

## 2021-08-14 MED ORDER — FLEET ENEMA 7-19 GM/118ML RE ENEM: 1.0000 | ENEMA | Freq: Once | RECTAL | Status: DC | PRN

## 2021-08-14 MED ORDER — MENTHOL 3 MG MT LOZG: 1.0000 | LOZENGE | OROMUCOSAL | Status: DC | PRN

## 2021-08-14 MED ORDER — BISACODYL 10 MG RE SUPP: 10.0000 mg | Freq: Every day | RECTAL | Status: DC | PRN

## 2021-08-14 MED ORDER — LACTATED RINGERS IV SOLN: INTRAVENOUS | Status: DC

## 2021-08-14 MED ORDER — LORATADINE 10 MG PO TABS
10.0000 mg | ORAL_TABLET | Freq: Every day | ORAL | Status: DC
  Administered 2021-08-14 – 2021-08-15 (×2): 10 mg via ORAL
  Filled 2021-08-14 (×2): qty 1

## 2021-08-14 MED ORDER — 0.9 % SODIUM CHLORIDE (POUR BTL) OPTIME
TOPICAL | Status: DC | PRN
  Administered 2021-08-14: 1000 mL

## 2021-08-14 MED ORDER — DEXTROSE-NACL 5-0.45 % IV SOLN: INTRAVENOUS | Status: DC

## 2021-08-14 MED ORDER — THROMBIN 5000 UNITS EX SOLR
CUTANEOUS | Status: AC
  Filled 2021-08-14: qty 5000

## 2021-08-14 MED ORDER — DEXTROSE-NACL 5-0.45 % IV SOLN: INTRAVENOUS | Status: DC | PRN

## 2021-08-14 MED ORDER — CYCLOBENZAPRINE HCL 10 MG PO TABS: 10.0000 mg | ORAL_TABLET | Freq: Three times a day (TID) | ORAL | Status: DC | PRN

## 2021-08-14 MED ORDER — PROPOFOL 10 MG/ML IV BOLUS
INTRAVENOUS | Status: AC
  Filled 2021-08-14: qty 20

## 2021-08-14 MED ORDER — ACETAMINOPHEN 500 MG PO TABS
ORAL_TABLET | ORAL | Status: AC
  Filled 2021-08-14: qty 2

## 2021-08-14 MED ORDER — CEFAZOLIN SODIUM-DEXTROSE 2-4 GM/100ML-% IV SOLN
2.0000 g | Freq: Three times a day (TID) | INTRAVENOUS | Status: AC
  Administered 2021-08-14 – 2021-08-15 (×2): 2 g via INTRAVENOUS
  Filled 2021-08-14 (×2): qty 100

## 2021-08-14 MED ORDER — ONDANSETRON HCL 4 MG/2ML IJ SOLN
INTRAMUSCULAR | Status: DC | PRN
  Administered 2021-08-14: 4 mg via INTRAVENOUS

## 2021-08-14 MED ORDER — SODIUM CHLORIDE 0.9% FLUSH: 3.0000 mL | INTRAVENOUS | Status: DC | PRN

## 2021-08-14 MED ORDER — SODIUM CHLORIDE 0.9% FLUSH
3.0000 mL | Freq: Two times a day (BID) | INTRAVENOUS | Status: DC
  Administered 2021-08-14 (×2): 3 mL via INTRAVENOUS

## 2021-08-14 MED ORDER — DEXAMETHASONE SODIUM PHOSPHATE 10 MG/ML IJ SOLN
INTRAMUSCULAR | Status: DC | PRN
  Administered 2021-08-14: 10 mg via INTRAVENOUS

## 2021-08-14 MED ORDER — PHENYLEPHRINE HCL-NACL 20-0.9 MG/250ML-% IV SOLN
INTRAVENOUS | Status: DC | PRN
  Administered 2021-08-14: 25 ug/min via INTRAVENOUS

## 2021-08-14 MED ORDER — FENTANYL CITRATE (PF) 250 MCG/5ML IJ SOLN
INTRAMUSCULAR | Status: DC | PRN
  Administered 2021-08-14 (×3): 50 ug via INTRAVENOUS
  Administered 2021-08-14: 100 ug via INTRAVENOUS

## 2021-08-14 MED ORDER — SODIUM CHLORIDE 0.9 % IV SOLN: 250.0000 mL | INTRAVENOUS | Status: DC

## 2021-08-14 MED ORDER — PREDNISONE 5 MG PO TABS: 5.0000 mg | ORAL_TABLET | ORAL | Status: DC

## 2021-08-14 MED ORDER — BUPIVACAINE HCL (PF) 0.5 % IJ SOLN
INTRAMUSCULAR | Status: DC | PRN
  Administered 2021-08-14: 25 mL
  Administered 2021-08-14: 5 mL

## 2021-08-14 MED ORDER — FENTANYL CITRATE (PF) 250 MCG/5ML IJ SOLN
INTRAMUSCULAR | Status: AC
  Filled 2021-08-14: qty 5

## 2021-08-14 MED ORDER — ONDANSETRON HCL 4 MG/2ML IJ SOLN: 4.0000 mg | Freq: Four times a day (QID) | INTRAMUSCULAR | Status: DC | PRN

## 2021-08-14 MED ORDER — THROMBIN 20000 UNITS EX SOLR: CUTANEOUS | Status: DC | PRN

## 2021-08-14 MED ORDER — DILTIAZEM HCL ER COATED BEADS 120 MG PO CP24
120.0000 mg | ORAL_CAPSULE | Freq: Every day | ORAL | Status: DC
  Filled 2021-08-14: qty 1

## 2021-08-14 MED ORDER — OXYCODONE-ACETAMINOPHEN 5-325 MG PO TABS
1.0000 | ORAL_TABLET | ORAL | Status: DC | PRN
Start: 2021-08-14 — End: 2021-08-15
  Administered 2021-08-14 – 2021-08-15 (×5): 2 via ORAL
  Filled 2021-08-14 (×5): qty 2

## 2021-08-14 MED ORDER — POLYETHYLENE GLYCOL 3350 17 G PO PACK: 17.0000 g | PACK | Freq: Every day | ORAL | Status: DC | PRN

## 2021-08-14 MED ORDER — HYDROCHLOROTHIAZIDE 12.5 MG PO TABS
12.5000 mg | ORAL_TABLET | Freq: Every day | ORAL | Status: DC
  Administered 2021-08-15: 12.5 mg via ORAL
  Filled 2021-08-14: qty 1

## 2021-08-14 MED ORDER — CHLORHEXIDINE GLUCONATE 0.12 % MT SOLN
OROMUCOSAL | Status: AC
  Filled 2021-08-14: qty 15

## 2021-08-14 MED ORDER — PHENYLEPHRINE 40 MCG/ML (10ML) SYRINGE FOR IV PUSH (FOR BLOOD PRESSURE SUPPORT)
PREFILLED_SYRINGE | INTRAVENOUS | Status: AC
  Filled 2021-08-14: qty 10

## 2021-08-14 MED ORDER — TRIAMTERENE-HCTZ 37.5-25 MG PO CAPS
1.0000 | ORAL_CAPSULE | Freq: Every morning | ORAL | Status: DC
  Filled 2021-08-14 (×3): qty 1

## 2021-08-14 SURGICAL SUPPLY — 70 items
ADH SKN CLS APL DERMABOND .7 (GAUZE/BANDAGES/DRESSINGS) ×1
APL SRG 60D 8 XTD TIP BNDBL (TIP)
BAG COUNTER SPONGE SURGICOUNT (BAG) ×2 IMPLANT
BAG SPNG CNTER NS LX DISP (BAG) ×1
BAG SURGICOUNT SPONGE COUNTING (BAG) ×1
BASKET BONE COLLECTION (BASKET) ×3 IMPLANT
BLADE CLIPPER SURG (BLADE) IMPLANT
BONE CANC CHIPS 40CC CAN1/2 (Bone Implant) ×3 IMPLANT
BUR MATCHSTICK NEURO 3.0 LAGG (BURR) ×3 IMPLANT
CAGE COROENT LG 10X9X23-12 (Cage) ×4 IMPLANT
CAGE COROENT MP 8X9X23M-8 SPIN (Cage) ×4 IMPLANT
CANISTER SUCT 3000ML PPV (MISCELLANEOUS) ×3 IMPLANT
CHIPS CANC BONE 40CC CAN1/2 (Bone Implant) ×1 IMPLANT
CNTNR URN SCR LID CUP LEK RST (MISCELLANEOUS) ×1 IMPLANT
CONT SPEC 4OZ STRL OR WHT (MISCELLANEOUS) ×3
COVER BACK TABLE 60X90IN (DRAPES) ×3 IMPLANT
DECANTER SPIKE VIAL GLASS SM (MISCELLANEOUS) ×3 IMPLANT
DERMABOND ADVANCED (GAUZE/BANDAGES/DRESSINGS) ×2
DERMABOND ADVANCED .7 DNX12 (GAUZE/BANDAGES/DRESSINGS) ×1 IMPLANT
DEVICE DISSECT PLASMABLAD 3.0S (MISCELLANEOUS) ×1 IMPLANT
DRAPE C-ARM 42X72 X-RAY (DRAPES) ×6 IMPLANT
DRAPE HALF SHEET 40X57 (DRAPES) IMPLANT
DRAPE LAPAROTOMY 100X72X124 (DRAPES) ×3 IMPLANT
DURAPREP 26ML APPLICATOR (WOUND CARE) ×3 IMPLANT
DURASEAL APPLICATOR TIP (TIP) IMPLANT
DURASEAL SPINE SEALANT 3ML (MISCELLANEOUS) IMPLANT
ELECT REM PT RETURN 9FT ADLT (ELECTROSURGICAL) ×3
ELECTRODE REM PT RTRN 9FT ADLT (ELECTROSURGICAL) ×1 IMPLANT
GAUZE 4X4 16PLY ~~LOC~~+RFID DBL (SPONGE) IMPLANT
GAUZE SPONGE 4X4 12PLY STRL (GAUZE/BANDAGES/DRESSINGS) ×3 IMPLANT
GLOVE SURG LTX SZ8.5 (GLOVE) ×6 IMPLANT
GLOVE SURG UNDER POLY LF SZ8.5 (GLOVE) ×6 IMPLANT
GOWN STRL REUS W/ TWL LRG LVL3 (GOWN DISPOSABLE) IMPLANT
GOWN STRL REUS W/ TWL XL LVL3 (GOWN DISPOSABLE) IMPLANT
GOWN STRL REUS W/TWL 2XL LVL3 (GOWN DISPOSABLE) ×6 IMPLANT
GOWN STRL REUS W/TWL LRG LVL3 (GOWN DISPOSABLE)
GOWN STRL REUS W/TWL XL LVL3 (GOWN DISPOSABLE)
GRAFT BNE CHIP CANC 1-8 40 (Bone Implant) IMPLANT
GRAFT BONE PROTEIOS LRG 5CC (Orthopedic Implant) ×2 IMPLANT
HEMOSTAT POWDER KIT SURGIFOAM (HEMOSTASIS) IMPLANT
KIT BASIN OR (CUSTOM PROCEDURE TRAY) ×3 IMPLANT
KIT GRAFTMAG DEL NEURO DISP (NEUROSURGERY SUPPLIES) ×3 IMPLANT
KIT TURNOVER KIT B (KITS) ×3 IMPLANT
MILL MEDIUM DISP (BLADE) ×3 IMPLANT
NDL SPNL 18GX3.5 QUINCKE PK (NEEDLE) IMPLANT
NEEDLE HYPO 22GX1.5 SAFETY (NEEDLE) ×3 IMPLANT
NEEDLE SPNL 18GX3.5 QUINCKE PK (NEEDLE) IMPLANT
NS IRRIG 1000ML POUR BTL (IV SOLUTION) ×3 IMPLANT
PACK LAMINECTOMY NEURO (CUSTOM PROCEDURE TRAY) ×3 IMPLANT
PAD ARMBOARD 7.5X6 YLW CONV (MISCELLANEOUS) ×9 IMPLANT
PATTIES SURGICAL .5 X1 (DISPOSABLE) ×3 IMPLANT
PLASMABLADE 3.0S (MISCELLANEOUS) ×3
ROD RELIN-O LORD 5.5X65MM (Rod) ×2 IMPLANT
ROD RELINE TI LORD 5.5X70 (Rod) ×2 IMPLANT
SCREW LOCK RELINE 5.5 TULIP (Screw) ×12 IMPLANT
SCREW RELINE-O POLY 6.5X45 (Screw) ×12 IMPLANT
SPONGE SURGIFOAM ABS GEL 100 (HEMOSTASIS) ×3 IMPLANT
SPONGE T-LAP 4X18 ~~LOC~~+RFID (SPONGE) IMPLANT
SUT PROLENE 6 0 BV (SUTURE) IMPLANT
SUT VIC AB 1 CT1 18XBRD ANBCTR (SUTURE) ×1 IMPLANT
SUT VIC AB 1 CT1 8-18 (SUTURE) ×3
SUT VIC AB 2-0 CP2 18 (SUTURE) ×3 IMPLANT
SUT VIC AB 3-0 SH 8-18 (SUTURE) ×3 IMPLANT
SUT VIC AB 4-0 RB1 18 (SUTURE) ×3 IMPLANT
SYR 3ML LL SCALE MARK (SYRINGE) ×12 IMPLANT
SYR 5ML LL (SYRINGE) IMPLANT
TOWEL GREEN STERILE (TOWEL DISPOSABLE) ×3 IMPLANT
TOWEL GREEN STERILE FF (TOWEL DISPOSABLE) ×3 IMPLANT
TRAY FOLEY MTR SLVR 16FR STAT (SET/KITS/TRAYS/PACK) ×3 IMPLANT
WATER STERILE IRR 1000ML POUR (IV SOLUTION) ×3 IMPLANT

## 2021-08-14 NOTE — Anesthesia Procedure Notes (Signed)
Procedure Name: Intubation Date/Time: 08/14/2021 8:09 AM Performed by: Gaylene Brooks, CRNA Pre-anesthesia Checklist: Patient identified, Emergency Drugs available, Suction available and Patient being monitored Patient Re-evaluated:Patient Re-evaluated prior to induction Oxygen Delivery Method: Circle System Utilized Preoxygenation: Pre-oxygenation with 100% oxygen Induction Type: IV induction Ventilation: Mask ventilation without difficulty Laryngoscope Size: Miller and 2 Grade View: Grade II Tube type: Oral Tube size: 7.5 mm Number of attempts: 1 Airway Equipment and Method: Stylet and Oral airway Placement Confirmation: ETT inserted through vocal cords under direct vision, positive ETCO2 and breath sounds checked- equal and bilateral Secured at: 22 cm Tube secured with: Tape Dental Injury: Teeth and Oropharynx as per pre-operative assessment

## 2021-08-14 NOTE — Op Note (Signed)
Date of surgery: 08/14/2021 Preoperative diagnosis: Spondylosis and stenosis L4-5 and L5-S1 with lumbar radiculopathy, neurogenic claudication. Postoperative diagnosis: Same Procedure: Bilateral laminectomy at L4-5 with decompression of the L4 and L5 nerve roots and bilateral laminectomy L5-S1 with decompression of L5 and S1 nerve roots with more work than required for simple interbody technique.  Posterior lumbar interbody arthrodesis L4-5 and L5-S1 with local autograft allograft and Proteus.  Posterolateral arthrodesis L4 to sacrum with local autograft allograft and Proteus.  Pedicle screw fixation L4 to sacrum.  Removal of old hardware L3-L4.  Surgeon: Kristeen Miss Anesthesia: General endotracheal Indications: The patient is a 75 year old male who has had previous surgical decompression stabilization at L2-3 and L3-4.  He has developed adjacent level disease with a severe to grade of stenosis at L4-5 and moderate degree of stenosis at L5-S1.  Having seen the progression of the changes in the spine he has been taken to the operating room now for surgical decompression and stabilization from L4 to the sacrum.  Procedure: Patient was brought to the operating room supine on the stretcher.  After the smooth induction of general endotracheal anesthesia, he was carefully turned prone.  The back was prepped with alcohol DuraPrep and draped in a sterile fashion.  Midline incision was created and carried down to the lumbodorsal fascia was opened on either side of midline to expose the inferior margin of the hardware at L3-4.  The hardware was then skeletonized.  The screws were loosened and removed with the rods being removed also at L3-L4.  Then dissection was carried out laterally to expose the laminar arches of L4 and L5 in its completeness.  The dissection was carried out over the facet joints at L4-5 and L5-S1.  Self-retaining retractor was placed deep into the wound.  Then a laminectomy was created removing  the inferior margin lamina at L4-5 out to and including the entirety of the facet at L4-5.  The dissection was then taken out to expose the L4 nerve root inferiorly and the L5 nerve root below that.  The disc space was isolated.  Then the disc space was entered with a #15 blade and a combination of curettes and rongeurs was used to perform a total discectomy at the L4-L5 level.  The interspace was then sized for an appropriate size spacer and it was felt that a 10 x 9 x 23 mm spacer with 12 degrees lordosis would fit best into the interval.  Next a total of 18 cc of bone graft was packed into the interspace along with those 2 cages.  Attention was then turned to L5-S1 where similar process was undertaken.  An 8 x 9 x 23 mm spacer with 8 degrees lordosis was then placed into the interval after a complete discectomy was performed.  The endplates were rongeured smooth to make sure there was good bony contact with the spacers and the bone graft.  A total of 10 cc of bone graft was packed into the interval and the space.  Lateral gutters were then decorticated and packed with a total of 10 cc of bone graft into each lateral gutter pedicle entry sites were then chosen and L5 and the sacrum and L4 very carefully using fluoroscopic guidance 6.5 x 45 mm screws were placed in L4-L5 and the sacrum.  On the right side a 65 mm precontoured rod was used to secure the screws together and on the left side a 70 mm rod was required.  These were tightened in a  neutral construct.  Final radiographs were obtained in AP and lateral projection showed good alignment of the spine good placement of the hardware.  With this the area was carefully inspected and care was taken to make sure that the L4 the L5 and the S1 nerve roots were all well decompressed hemostasis was achieved and then the lumbodorsal fascia was closed with #1 Vicryl in interrupted fashion 2-0 Vicryl was used in subcutaneous tissues 3-0 Vicryl subcuticularly and Dermabond  was placed on the skin blood loss for the procedure was estimated 300 cc no Cell Saver blood was available for return to the patient.

## 2021-08-14 NOTE — H&P (Signed)
Cody Mcconnell is an 75 y.o. male.   Chief Complaint: Back and lower extremity pain HPI: Cody Mcconnell is a 75 year old individual whose had significant spondylitic stenosis at L4-5 and L5-S1.  He also has significant hip pathology and has had an arthroplasty there.  Nonetheless the patient's symptoms seem to be a combination of stenosis symptoms with radiculopathy and he has been advised regarding surgical decompression arthrodesis at L4-5 and L5-S1.  Patient had initially been seen by Dr. Erline Levine but in his absence he desires to proceed with surgical intervention.    Past Medical History:  Diagnosis Date   Anemia    SLIGHT ANEMIA 4 MONTHS AGO   Aortic valve stenosis 12/19/2017   Echo 07/30/16 - EF 55-60, mild aortic stenosis (mean 9) // Echo 4/19:  EF 60-65 mild AS (mean 11) // Echocardiogram 11/21: EF 60-65, no RWMA, mild LVH, Gr 1 DD, normal RVSF, trivial MR, calcified AV, mod AS (mean 20 mmHg, Vmax 280 cm/s, DI 0.27), trivial AI  // Echo 5/22: EF 60-65, moderate LVH, no RWMA, trivial MR, moderate AS (mean gradient 20 mmHg, V-max 287 cm/s, DI 0.35)    Arthritis    OA   Benign prostate hyperplasia    unspecified whether lower urinary tract sym. present    Carotid atherosclerosis    Carotid US 4/19:  bilat ICA 1-39; vertebrals and subclavians normal   Carotid atherosclerosis, bilateral 04/30/2018   1-39% on 12/2017 Care Everywhere Echo   Coronary artery calcification seen on CT scan 06/05/2018   Dysrhythmia    ATRIAL FIB   Elevated prostate specific antigen (PSA) 08/30/2016   Glucose intolerance    "pre-diabetic"   Hayfever    Heart murmur    History of exercise stress test    GXT 4/19:  ETT with good exercise tolerance (10:00); no chest pain; normal BP response; no diagnostic ST changes; negative adequate ETT.   HOH (hard of hearing)    BOTH EARS   Low back pain with sciatica 06/02/2012   Lumbar herniated disc 01/30/2018   Mild aortic stenosis 04/30/2018   12/2017  Care Everywhere Echo : Mean gradient (S): 11 mm Hg. Peak gradient (S): 24 mm Hg.   Numbness of right thumb    FROM CERVICAL NECK SURGERY   Osteoarthritis of right hip 09/21/2018   Overweight (BMI 25.0-29.9) 09/30/2018   Paroxysmal atrial fibrillation ([place]) 06/05/2018   Pre-diabetes    Prediabetes 04/30/2018   not sure about this   Prostatitis    unspecified prostatitis type   Pure hypercholesterolemia     Past Surgical History:  Procedure Laterality Date   ANTERIOR LAT LUMBAR FUSION Right 06/29/2020   Procedure: Right Lumbar Two-Three Anterolateral lumbar interbody fusion with lateral plate;  Surgeon: Erline Levine, MD;  Location: [facility];  Service: Neurosurgery;  Laterality: Right;  Right Lumbar Two-Three Anterolateral lumbar interbody fusion with lateral plate   CERVICAL SPINE SURGERY  2011   COLONOSCOPY     HIP SURGERY Right    arthroscopy   LUMBAR DISC SURGERY  2014   L2-3, L3 TO L4 X 2 2019 AT [facility]   LUMBAR FUSION  2019   psoas muscle repair (groin muscle?)  2022   SHOULDER ARTHROSCOPY Right    TOOTH EXTRACTION     TOTAL HIP ARTHROPLASTY Right 09/29/2018   Procedure: TOTAL HIP ARTHROPLASTY ANTERIOR APPROACH;  Surgeon: Paralee Cancel, MD;  Location: [facility];  Service: Orthopedics;  Laterality: Right;  71    Family History  Problem Relation Age of Onset   Dementia Mother    Congestive Heart Failure Father    CAD Father        quad bypass at age 31   CAD Brother 84   Social History:  reports that he quit smoking about 44 years ago. His smoking use included cigarettes. He has a 18.00 pack-year smoking history. He has never used smokeless tobacco. He reports that he does not drink alcohol and does not use drugs.  Allergies: No Known Allergies  Medications Prior to Admission  Medication Sig Dispense Refill   acetaminophen (TYLENOL) 325 MG tablet Take 650 mg by mouth every 6 (six) hours as needed for moderate pain.     cetirizine (ZYRTEC) 10 MG tablet Take 10 mg by mouth  every other day.     folic acid (FOLVITE) 1 MG tablet Take 1 mg by mouth daily.     hydrochlorothiazide (HYDRODIURIL) 12.5 MG tablet Take 12.5 mg by mouth daily.     methotrexate (RHEUMATREX) 2.5 MG tablet Take 7.5 mg by mouth every Tuesday.     montelukast (SINGULAIR) 10 MG tablet Take 10 mg by mouth at bedtime.     predniSONE (DELTASONE) 5 MG tablet Take 5 mg by mouth every other day.     sildenafil (VIAGRA) 100 MG tablet Take 100 mg by mouth as needed for erectile dysfunction.     simvastatin (ZOCOR) 40 MG tablet Take 40 mg by mouth daily.     triamterene-hydrochlorothiazide (DYAZIDE) 37.5-25 MG capsule Take 1 capsule by mouth every morning.     diltiazem (CARDIZEM CD) 120 MG 24 hr capsule TAKE 1 CAPSULE(120 MG) BY MOUTH DAILY (Patient not taking: Reported on 07/31/2021) 90 capsule 2   tadalafil (CIALIS) 20 MG tablet Take 20 mg by mouth daily as needed for erectile dysfunction.      Results for orders placed or performed during the hospital encounter of 08/14/21 (from the past 48 hour(s))  Glucose, capillary     Status: Abnormal   Collection Time: 08/14/21  6:09 AM  Result Value Ref Range   Glucose-Capillary 122 (H) 70 - 99 mg/dL    Comment: Glucose reference range applies only to samples taken after fasting for at least 8 hours.   No results found.  Review of Systems  Musculoskeletal:  Positive for back pain, gait problem and myalgias.  Neurological:  Positive for weakness and numbness.  All other systems reviewed and are negative.  Blood pressure (!) 164/66, pulse 86, temperature 98.3 F (36.8 C), temperature source Oral, resp. rate 18, height 5' 8.5" (1.74 m), weight 73.9 kg, SpO2 97 %. Physical Exam Constitutional:      Appearance: Normal appearance. He is normal weight.  HENT:     Head: Normocephalic and atraumatic.     Right Ear: Tympanic membrane normal.     Left Ear: Tympanic membrane normal.     Nose: Nose normal.     Mouth/Throat:     Mouth: Mucous membranes are  moist.  Eyes:     Extraocular Movements: Extraocular movements intact.     Conjunctiva/sclera: Conjunctivae normal.     Pupils: Pupils are equal, round, and reactive to light.  Cardiovascular:     Rate and Rhythm: Normal rate and regular rhythm.     Pulses: Normal pulses.     Heart sounds: Normal heart sounds.  Pulmonary:     Effort: Pulmonary effort is normal.     Breath sounds: Normal breath sounds.  Abdominal:  General: Abdomen is flat. Bowel sounds are normal.     Palpations: Abdomen is soft.  Musculoskeletal:        General: Normal range of motion.     Cervical back: Normal range of motion and neck supple.  Skin:    General: Skin is warm and dry.     Capillary Refill: Capillary refill takes less than 2 seconds.  Neurological:     General: No focal deficit present.     Mental Status: He is alert.     Comments: Mild tibialis anterior weakness bilaterally.  Absent patellar and gastroc reflexes.  Positive straight leg raising at 30 degrees.  Cranial nerve examination is normal.  Psychiatric:        Mood and Affect: Mood normal.        Behavior: Behavior normal.        Thought Content: Thought content normal.        Judgment: Judgment normal.     Assessment/Plan Spondylosis and stenosis L4-5 and L5-S1 with radiculopathy and neurogenic claudication.  Plan posterior lumbar decompression L4-5 and L5-S1 with posterior interbody arthrodesis and peek spacers local autograft and allograft arthrodesis from L4 to the sacrum.  Earleen Newport, MD 08/14/2021, 7:54 AM

## 2021-08-14 NOTE — Progress Notes (Signed)
Orthopedic Tech Progress Note Patient Details:  Cody Mcconnell Oct 27, 1945 [id number]  Ortho Devices Type of Ortho Device: Lumbar corsett Ortho Device/Splint Location: BACK Ortho Device/Splint Interventions: Ordered   Post Interventions Patient Tolerated: Well Instructions Provided: [facility] [place] 08/14/2021, 6:12 PM

## 2021-08-14 NOTE — Transfer of Care (Signed)
Immediate Anesthesia Transfer of Care Note  Patient: Cody Mcconnell  Procedure(s) Performed: L4-5 L5-S1 PLIF (Back)  Patient Location: PACU  Anesthesia Type:General  Level of Consciousness: awake, alert  and oriented  Airway & Oxygen Therapy: Patient Spontanous Breathing and Patient connected to nasal cannula oxygen  Post-op Assessment: Report given to RN, Post -op Vital signs reviewed and stable and Patient moving all extremities X 4  Post vital signs: Reviewed and stable  Last Vitals:  Vitals Value Taken Time  BP 128/72 08/14/21 1306  Temp    Pulse 89 08/14/21 1309  Resp 21 08/14/21 1309  SpO2 100 % 08/14/21 1309  Vitals shown include unvalidated device data.  Last Pain:  Vitals:   08/14/21 0627  TempSrc:   PainSc: 4          Complications: No notable events documented.

## 2021-08-14 NOTE — Anesthesia Postprocedure Evaluation (Signed)
Anesthesia Post Note  Patient: Whitten A Lariccia  Procedure(s) Performed: L4-5 L5-S1 PLIF (Back)     Patient location during evaluation: PACU Anesthesia Type: General Level of consciousness: awake and alert, patient cooperative and oriented Pain management: pain level controlled Vital Signs Assessment: post-procedure vital signs reviewed and stable Respiratory status: spontaneous breathing, nonlabored ventilation and respiratory function stable Cardiovascular status: blood pressure returned to baseline and stable Postop Assessment: no apparent nausea or vomiting Anesthetic complications: no   No notable events documented.  Last Vitals:  Vitals:   08/14/21 1421 08/14/21 1456  BP: 125/71 125/67  Pulse: 81 72  Resp: 20 20  Temp: 36.6 C 36.7 C  SpO2: 96% 99%    Last Pain:  Vitals:   08/14/21 1421  TempSrc:   PainSc: 0-No pain                 Daiana Vitiello,E. Karthika Glasper

## 2021-08-15 LAB — BASIC METABOLIC PANEL
Anion gap: 10 (ref 5–15)
BUN: 18 mg/dL (ref 8–23)
CO2: 26 mmol/L (ref 22–32)
Calcium: 8.8 mg/dL — ABNORMAL LOW (ref 8.9–10.3)
Chloride: 98 mmol/L (ref 98–111)
Creatinine, Ser: 1.12 mg/dL (ref 0.61–1.24)
GFR, Estimated: >60 mL/min (ref >60)
Glucose, Bld: 151 mg/dL — ABNORMAL HIGH (ref 70–99)
Potassium: 3.5 mmol/L (ref 3.5–5.1)
Sodium: 134 mmol/L — ABNORMAL LOW (ref 135–145)

## 2021-08-15 LAB — CBC
HCT: 38.4 % — ABNORMAL LOW (ref 39.0–52.0)
Hemoglobin: 13.1 g/dL (ref 13.0–17.0)
MCH: 28.9 pg (ref 26.0–34.0)
MCHC: 34.1 g/dL (ref 30.0–36.0)
MCV: 84.6 fL (ref 80.0–100.0)
Platelets: 259 10*3/uL (ref 150–400)
RBC: 4.54 MIL/uL (ref 4.22–5.81)
RDW: 13.3 % (ref 11.5–15.5)
WBC: 14.2 10*3/uL — ABNORMAL HIGH (ref 4.0–10.5)
nRBC: 0 % (ref 0.0–0.2)

## 2021-08-15 LAB — GLUCOSE, CAPILLARY: Glucose-Capillary: 144 mg/dL — ABNORMAL HIGH (ref 70–99)

## 2021-08-15 MED ORDER — TRIAMTERENE-HCTZ 37.5-25 MG PO TABS
1.0000 | ORAL_TABLET | Freq: Every day | ORAL | Status: DC
Start: 2021-08-15 — End: 2021-08-15
  Administered 2021-08-15: 1 via ORAL
  Filled 2021-08-15: qty 1

## 2021-08-15 MED ORDER — METHOCARBAMOL 500 MG PO TABS: 500.0000 mg | ORAL_TABLET | Freq: Four times a day (QID) | ORAL | 3 refills | Status: DC

## 2021-08-15 MED ORDER — OXYCODONE-ACETAMINOPHEN 5-325 MG PO TABS
1.0000 | ORAL_TABLET | ORAL | 0 refills | Status: DC | PRN
Start: 2021-08-15 — End: 2022-09-09

## 2021-08-15 MED FILL — Heparin Sodium (Porcine) Inj 1000 Unit/ML: INTRAMUSCULAR | Qty: 30 | Status: AC

## 2021-08-15 MED FILL — Sodium Chloride IV Soln 0.9%: INTRAVENOUS | Qty: 1000 | Status: AC

## 2021-08-15 MED FILL — Sodium Chloride Irrigation Soln 0.9%: Qty: 3000 | Status: AC

## 2021-08-15 NOTE — Progress Notes (Signed)
Patient is discharged from room 3C03 at this time. Alert and in stable condition. IV site d/c'd and instructions read to patient with understanding verbalized and all questions answered. Left unit via wheelchair with all belongings at side.

## 2021-08-15 NOTE — Evaluation (Signed)
Physical Therapy Evaluation and Discharge Patient Details Name: Cody Mcconnell MRN: [id number] DOB: 11/22/1945 Today's Date: 08/15/2021  History of Present Illness  Pt is a 75 y.o. M who presents s/p L4-S1 PLIF 08/13/2021. Significant PMH: prior back and neck surgeries, R THA, R TSA.  Clinical Impression  Patient evaluated by Physical Therapy with no further acute PT needs identified. Pt reports good pain control. Overall, he is mobilizing well. Ambulating hallway distances with no assistive device and negotiated a half flight of stairs without physical difficulty. Education provided regarding spinal precautions, brace use, activity recommendations. All education has been completed and the patient has no further questions. No follow-up Physical Therapy or equipment needs. PT is signing off. Thank you for this referral.      Recommendations for follow up therapy are one component of a multi-disciplinary discharge planning process, led by the attending physician.  Recommendations may be updated based on patient status, additional functional criteria and insurance authorization.  Follow Up Recommendations No PT follow up    Assistance Recommended at Discharge Intermittent Supervision/Assistance  Functional Status Assessment Patient has not had a recent decline in their functional status  Equipment Recommendations  None recommended by PT    Recommendations for Other Services       Precautions / Restrictions Precautions Precautions: Fall;Back Precaution Booklet Issued: Yes (comment) Precaution Comments: verbally reviewed - pt familiar with back precautions from previous sx Required Braces or Orthoses: Spinal Brace Spinal Brace: Lumbar corset Restrictions Weight Bearing Restrictions: No      Mobility  Bed Mobility Overal bed mobility: Modified Independent                  Transfers Overall transfer level: Independent Equipment used: None                     Ambulation/Gait Ambulation/Gait assistance: Independent Gait Distance (Feet): 400 Feet Assistive device: None Gait Pattern/deviations: WFL(Within Functional Limits)       General Gait Details: cues for upward gaze  Stairs Stairs: Yes Stairs assistance: Modified independent (Device/Increase time) Stair Management: One rail Left Number of Stairs: 12 General stair comments: cues for step by step for comfort  Wheelchair Mobility    Modified Rankin (Stroke Patients Only)       Balance Overall balance assessment: No apparent balance deficits (not formally assessed)                                           Pertinent Vitals/Pain Pain Assessment: Faces Faces Pain Scale: Hurts a little bit Pain Location: sx site, groin Pain Descriptors / Indicators: Discomfort Pain Intervention(s): Monitored during session    Home Living Family/patient expects to be discharged to:: Private residence Living Arrangements: Spouse/significant other Available Help at Discharge: Family;Available 24 hours/day Type of Home: House Home Access: Stairs to enter   Entrance Stairs-Number of Steps: 3 Alternate Level Stairs-Number of Steps: flight Home Layout: Multi-level;Bed/bath upstairs        Prior Function Prior Level of Function : Independent/Modified Independent;Working/employed;Driving             Mobility Comments: indep, no AD. pt's states that sometimes his back pain was so bad he had to crawl ADLs Comments: indep, drives, owns multiple buisnesses     Hand Dominance   Dominant Hand: Right    Extremity/Trunk Assessment   Upper Extremity Assessment Upper Extremity  Assessment: Overall WFL for tasks assessed    Lower Extremity Assessment Lower Extremity Assessment: Overall WFL for tasks assessed    Cervical / Trunk Assessment Cervical / Trunk Assessment: Back Surgery  Communication   Communication: HOH  Cognition Arousal/Alertness:  Awake/alert Behavior During Therapy: WFL for tasks assessed/performed Overall Cognitive Status: Within Functional Limits for tasks assessed                                          General Comments      Exercises     Assessment/Plan    PT Assessment Patient does not need any further PT services  PT Problem List         PT Treatment Interventions      PT Goals (Current goals can be found in the Care Plan section)  Acute Rehab PT Goals Patient Stated Goal: less pain, return to baseline PT Goal Formulation: All assessment and education complete, DC therapy    Frequency     Barriers to discharge        Co-evaluation               AM-PAC PT "6 Clicks" Mobility  Outcome Measure Help needed turning from your back to your side while in a flat bed without using bedrails?: None Help needed moving from lying on your back to sitting on the side of a flat bed without using bedrails?: None Help needed moving to and from a bed to a chair (including a wheelchair)?: None Help needed standing up from a chair using your arms (e.g., wheelchair or bedside chair)?: None Help needed to walk in hospital room?: None Help needed climbing 3-5 steps with a railing? : None 6 Click Score: 24    End of Session Equipment Utilized During Treatment: Back brace Activity Tolerance: Patient tolerated treatment well Patient left: in bed;with call bell/phone within reach Nurse Communication: Mobility status PT Visit Diagnosis: Pain Pain - part of body:  (back)    Time: 3716-9678 PT Time Calculation (min) (ACUTE ONLY): 16 min   Charges:   PT Evaluation $PT Eval Low Complexity: [place], PT, DPT Acute Rehabilitation Services Pager [phone] Office [phone]   Deno Etienne 08/15/2021, 9:01 AM

## 2021-08-15 NOTE — Discharge Instructions (Signed)
Wound Care Remove outer dressing in 2 days Leave incision open to air. You may shower. Do not scrub directly on incision.  Do not put any creams, lotions, or ointments on incision. Activity Walk each and every day, increasing distance each day. No lifting greater than 5 lbs.  Avoid excessive neck motion. No driving for 2 weeks; may ride as a passenger locally. Wear neck brace at all times except when showering.  If provided soft collar, may wear for comfort unless otherwise instructed. Diet Resume your normal diet.  Return to Work Will be discussed at you follow up appointment. Call Your Doctor If Any of These Occur Redness, drainage, or swelling at the wound.  Temperature greater than 101 degrees. Severe pain not relieved by pain medication. Increased difficulty swallowing. Incision starts to come apart. Follow Up Appt Call today for appointment in 3 weeks ([phone]) or for problems.  If you have any hardware placed in your spine, you will need an x-ray before your appointment.

## 2021-08-15 NOTE — Evaluation (Signed)
Occupational Therapy Evaluation Patient Details Name: Cody Mcconnell MRN: [id number] DOB: 1946-01-25 Today's Date: 08/15/2021   History of Present Illness Pt is a 75 y.o. M who presents s/p L4-S1 PLIF 08/13/2021. Significant PMH: prior back and neck surgeries, R THA, R TSA.   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 He lives in a multi-level home with his wife, 2-3 STE. Upon evaluation pt is minimally limited by pain and knowledge of back precautions. He benefited from verbal cues to maintain back precautions throughout ADLs, and is able to assume figure 4 position with increased time and effort for LB dressing. Overall he was indep for ambulation within the room. He does not have further acute OT needs. Recommend d/c home with assist as needed.    Recommendations for follow up therapy are one component of a multi-disciplinary discharge planning process, led by the attending physician.  Recommendations may be updated based on patient status, additional functional criteria and insurance authorization.   Follow Up Recommendations  No OT follow up    Assistance Recommended at Discharge PRN  Functional Status Assessment  Patient has had a recent decline in their functional status and demonstrates the ability to make significant improvements in function in a reasonable and predictable amount of time.  Equipment Recommendations  None recommended by OT (educated on AE.)    Recommendations for Other Services       Precautions / Restrictions Precautions Precautions: Fall;Back Precaution Booklet Issued: Yes (comment) Precaution Comments: verbally reviewed - pt familiar with back precautions from previous sx Required Braces or Orthoses: Spinal Brace Spinal Brace: Lumbar corset Restrictions Weight Bearing Restrictions: No      Mobility Bed Mobility Overal bed mobility: Modified Independent             General  bed mobility comments: verbalized great understanding    Transfers Overall transfer level: Independent Equipment used: None                      Balance Overall balance assessment: No apparent balance deficits (not formally assessed)                                         ADL either performed or assessed with clinical judgement   ADL Overall ADL's : Needs assistance/impaired Eating/Feeding: Independent;Sitting   Grooming: Minimal assistance;Cueing for compensatory techniques;Standing Grooming Details (indicate cue type and reason): min a for verbal cues to use two cups to maintain back precautions Upper Body Bathing: Minimal assistance;Sitting Upper Body Bathing Details (indicate cue type and reason): for verbal cues Lower Body Bathing: Minimal assistance;Cueing for back precautions;Cueing for compensatory techniques;Sit to/from stand Lower Body Bathing Details (indicate cue type and reason): for verbal cues Upper Body Dressing : Set up;Sitting   Lower Body Dressing: Minimal assistance;Sit to/from stand;Cueing for back precautions;Cueing for compensatory techniques Lower Body Dressing Details (indicate cue type and reason): for verbal cues; pt able to assume figure 4 with increased time and effort Toilet Transfer: Supervision/safety;Ambulation   Toileting- Clothing Manipulation and Hygiene: Supervision/safety;Sitting/lateral lean       Functional mobility during ADLs: Supervision/safety General ADL Comments: pt is well versed in back precautions, can be impulsive with twising and needs verbal cues. Pt benefits from verbal cues during ADLs for compensatory techniques to adhere.     Vision Baseline Vision/History:  1 Wears glasses Ability to See in Adequate Light: 0 Adequate Vision Assessment?: No apparent visual deficits     Perception     Praxis      Pertinent Vitals/Pain Pain Assessment: Faces Faces Pain Scale: Hurts a little bit Pain  Location: sx site, groin Pain Descriptors / Indicators: Discomfort Pain Intervention(s): Monitored during session     Hand Dominance Right   Extremity/Trunk Assessment Upper Extremity Assessment Upper Extremity Assessment: Overall WFL for tasks assessed   Lower Extremity Assessment Lower Extremity Assessment: Overall WFL for tasks assessed   Cervical / Trunk Assessment Cervical / Trunk Assessment: Back Surgery   Communication Communication Communication: HOH   Cognition Arousal/Alertness: Awake/alert Behavior During Therapy: WFL for tasks assessed/performed Overall Cognitive Status: Within Functional Limits for tasks assessed                                       General Comments  VSS on RA    Exercises     Shoulder Instructions      Home Living Family/patient expects to be discharged to:: Private residence Living Arrangements: Spouse/significant other Available Help at Discharge: Family;Available 24 hours/day Type of Home: House Home Access: Stairs to enter [employer] of Steps: 3   Home Layout: Multi-level;Bed/bath upstairs Alternate Level Stairs-Number of Steps: flight Alternate Level Stairs-Rails: Right Bathroom Shower/Tub: Occupational psychologist: Standard                Prior Functioning/Environment Prior Level of Function : Independent/Modified Independent;Working/employed;Driving             Mobility Comments: indep, no AD. pt's states that sometimes his back pain was so bad he had to crawl ADLs Comments: indep, drives, owns multiple buisnesses        OT Problem List: Decreased activity tolerance;Impaired balance (sitting and/or standing);Decreased knowledge of use of DME or AE;Decreased safety awareness;Decreased knowledge of precautions;Pain      OT Treatment/Interventions:      OT Goals(Current goals can be found in the care plan section) Acute Rehab OT Goals Patient Stated Goal: home today OT  Goal Formulation: With patient Time For Goal Achievement: 08/15/21  OT Frequency:     Barriers to D/C:            Co-evaluation              AM-PAC OT "6 Clicks" Daily Activity     Outcome Measure Help from another person eating meals?: None Help from another person taking care of personal grooming?: A Little Help from another person toileting, which includes using toliet, bedpan, or urinal?: A Little Help from another person bathing (including washing, rinsing, drying)?: A Little Help from another person to put on and taking off regular upper body clothing?: None Help from another person to put on and taking off regular lower body clothing?: A Little 6 Click Score: 20   End of Session Equipment Utilized During Treatment: Back brace Nurse Communication: Mobility status  Activity Tolerance: Patient tolerated treatment well Patient left: in bed;with call bell/phone within reach  OT Visit Diagnosis: Unsteadiness on feet (R26.81);Pain                Time: 1610-9604 OT Time Calculation (min): 22 min Charges:  OT General Charges $OT Visit: 1 Visit OT Evaluation $OT Eval Low Complexity: 1 Low   Morell Mears A Kayla Weekes 08/15/2021, 9:33 AM

## 2021-08-15 NOTE — Discharge Summary (Signed)
Physician Discharge Summary  Patient ID: BAUER AUSBORN MRN: [id number] DOB/AGE: Jan 24, 1946 75 y.o.  Admit date: 08/14/2021 Discharge date: 08/15/2021  Admission Diagnoses: Lumbar stenosis L4-5 L5-S1 neurogenic claudication lumbar radiculopathy  Discharge Diagnoses: Lumbar stenosis L4-5 L5-S1, neurogenic claudication, lumbar radiculopathy Principal Problem:   Lumbar stenosis with neurogenic claudication   Discharged Condition: good  Hospital Course: Patient was admitted for neurosurgical decompression at L4-5 and L5-S1 which he tolerated well.  Consults: None  Significant Diagnostic Studies: None  Treatments: surgery: See op note  Discharge Exam: Blood pressure 127/73, pulse 91, temperature 97.8 F (36.6 C), resp. rate 16, height 5' 8.5" (1.74 m), weight 73.9 kg, SpO2 99 %. Incision is clean and dry Station and gait are intact.  Predischarge blood glucose is 144.  Disposition: Discharge disposition: 01-Home or Self Care       Discharge Instructions     Call MD for:  redness, tenderness, or signs of infection (pain, swelling, redness, odor or green/yellow discharge around incision site)   Complete by: As directed    Call MD for:  severe uncontrolled pain   Complete by: As directed    Call MD for:  temperature >100.4   Complete by: As directed    Diet - low sodium heart healthy   Complete by: As directed    Discharge wound care:   Complete by: As directed    Okay to shower. Do not apply salves or appointments to incision. No heavy lifting with the upper extremities greater than 10 pounds. May resume driving when not requiring pain medication and patient feels comfortable with doing so.   Incentive spirometry RT   Complete by: As directed    Increase activity slowly   Complete by: As directed       Allergies as of 08/15/2021   No Known Allergies      Medication List     TAKE these medications    acetaminophen 325 MG tablet Commonly known as:  TYLENOL Take 650 mg by mouth every 6 (six) hours as needed for moderate pain.   cetirizine 10 MG tablet Commonly known as: ZYRTEC Take 10 mg by mouth every other day.   diltiazem 120 MG 24 hr capsule Commonly known as: CARDIZEM CD TAKE 1 CAPSULE(120 MG) BY MOUTH DAILY   folic acid 1 MG tablet Commonly known as: FOLVITE Take 1 mg by mouth daily.   hydrochlorothiazide 12.5 MG tablet Commonly known as: HYDRODIURIL Take 12.5 mg by mouth daily.   methocarbamol 500 MG tablet Commonly known as: Robaxin Take 1 tablet (500 mg total) by mouth 4 (four) times daily.   methotrexate 2.5 MG tablet Commonly known as: RHEUMATREX Take 7.5 mg by mouth every Tuesday.   montelukast 10 MG tablet Commonly known as: SINGULAIR Take 10 mg by mouth at bedtime.   oxyCODONE-acetaminophen 5-325 MG tablet Commonly known as: PERCOCET/ROXICET Take 1-2 tablets by mouth every 4 (four) hours as needed for moderate pain or severe pain.   predniSONE 5 MG tablet Commonly known as: DELTASONE Take 5 mg by mouth every other day.   sildenafil 100 MG tablet Commonly known as: VIAGRA Take 100 mg by mouth as needed for erectile dysfunction.   simvastatin 40 MG tablet Commonly known as: ZOCOR Take 40 mg by mouth daily.   tadalafil 20 MG tablet Commonly known as: CIALIS Take 20 mg by mouth daily as needed for erectile dysfunction.   triamterene-hydrochlorothiazide 37.5-25 MG capsule Commonly known as: DYAZIDE Take 1 capsule by mouth every morning.  Discharge Care Instructions  (From admission, onward)           Start     Ordered   08/15/21 0000  Discharge wound care:       Comments: Okay to shower. Do not apply salves or appointments to incision. No heavy lifting with the upper extremities greater than 10 pounds. May resume driving when not requiring pain medication and patient feels comfortable with doing so.   08/15/21 0945             Signed: Blanchie Dessert  Dimitria Ketchum 08/15/2021, 9:45 AM

## 2021-08-22 ENCOUNTER — Other Ambulatory Visit: Payer: Self-pay | Admitting: *Deleted

## 2021-08-22 NOTE — Patient Outreach (Signed)
[facility]) Care Management  08/22/2021  Cody Mcconnell 1945-09-26 [id number]   Telephone Assessment Call-A-Nurse Hotline-No needs- Case closed  RN spoke with pt today concerning his recent call into the nurse line concerning ongoing pain management related to previous surgery. Pt states he is currently awaiting a call back from his provider's office (neurology) to scheduled an appointment. Pt has confirmed his issues are not urgent/emergent and he has had 8 surgeries over the last 3 years. Pt states he is aware when to visit the provider's office verses the ED. Tolerance level while awaiting a call back are good with no acute needs at this time.   RN offered to continue follow calls until his issues are completely resolved however pt has indicated he has a very good support system in place to assist with his ongoing needs and does not need further assistance at this time however appreciative.  Will close case based upon the above information as pt pending provider's appointment.  Raina Mina, RN Care Management Coordinator [place] Office [phone]

## 2021-08-31 DIAGNOSIS — M4316 Spondylolisthesis, lumbar region: Secondary | ICD-10-CM | POA: Diagnosis not present

## 2021-08-31 DIAGNOSIS — M48062 Spinal stenosis, lumbar region with neurogenic claudication: Secondary | ICD-10-CM | POA: Diagnosis not present

## 2021-08-31 DIAGNOSIS — Z96641 Presence of right artificial hip joint: Secondary | ICD-10-CM | POA: Diagnosis not present

## 2021-08-31 DIAGNOSIS — M25551 Pain in right hip: Secondary | ICD-10-CM | POA: Diagnosis not present

## 2021-08-31 DIAGNOSIS — I10 Essential (primary) hypertension: Secondary | ICD-10-CM | POA: Diagnosis not present

## 2021-09-05 ENCOUNTER — Other Ambulatory Visit: Payer: Self-pay

## 2021-09-05 ENCOUNTER — Ambulatory Visit (HOSPITAL_COMMUNITY)
Admission: RE | Admit: 2021-09-05 | Discharge: 2021-09-05 | Disposition: A | Discharge status: Home / Self Care | Payer: Medicare Other | Source: Ambulatory Visit | Attending: Neurological Surgery | Admitting: Neurological Surgery

## 2021-09-05 ENCOUNTER — Other Ambulatory Visit (HOSPITAL_COMMUNITY): Payer: Self-pay | Admitting: Neurological Surgery

## 2021-09-05 DIAGNOSIS — R2243 Localized swelling, mass and lump, lower limb, bilateral: Secondary | ICD-10-CM | POA: Insufficient documentation

## 2021-09-13 DIAGNOSIS — S76201A Unspecified injury of adductor muscle, fascia and tendon of right thigh, initial encounter: Secondary | ICD-10-CM | POA: Diagnosis not present

## 2021-09-16 DIAGNOSIS — Z20822 Contact with and (suspected) exposure to covid-19: Secondary | ICD-10-CM | POA: Diagnosis not present

## 2021-09-19 DIAGNOSIS — M545 Low back pain, unspecified: Secondary | ICD-10-CM | POA: Diagnosis not present

## 2021-09-20 DIAGNOSIS — G5780 Other specified mononeuropathies of unspecified lower limb: Secondary | ICD-10-CM | POA: Diagnosis not present

## 2021-09-20 DIAGNOSIS — M79609 Pain in unspecified limb: Secondary | ICD-10-CM | POA: Diagnosis not present

## 2021-09-20 DIAGNOSIS — M5416 Radiculopathy, lumbar region: Secondary | ICD-10-CM | POA: Diagnosis not present

## 2021-09-25 DIAGNOSIS — I10 Essential (primary) hypertension: Secondary | ICD-10-CM | POA: Diagnosis not present

## 2021-09-25 DIAGNOSIS — I350 Nonrheumatic aortic (valve) stenosis: Secondary | ICD-10-CM | POA: Diagnosis not present

## 2021-09-25 DIAGNOSIS — L570 Actinic keratosis: Secondary | ICD-10-CM | POA: Diagnosis not present

## 2021-09-25 DIAGNOSIS — D1801 Hemangioma of skin and subcutaneous tissue: Secondary | ICD-10-CM | POA: Diagnosis not present

## 2021-09-25 DIAGNOSIS — D229 Melanocytic nevi, unspecified: Secondary | ICD-10-CM | POA: Diagnosis not present

## 2021-09-25 DIAGNOSIS — L578 Other skin changes due to chronic exposure to nonionizing radiation: Secondary | ICD-10-CM | POA: Diagnosis not present

## 2021-09-25 DIAGNOSIS — E7800 Pure hypercholesterolemia, unspecified: Secondary | ICD-10-CM | POA: Diagnosis not present

## 2021-09-25 DIAGNOSIS — Z8679 Personal history of other diseases of the circulatory system: Secondary | ICD-10-CM | POA: Diagnosis not present

## 2021-09-25 DIAGNOSIS — L821 Other seborrheic keratosis: Secondary | ICD-10-CM | POA: Diagnosis not present

## 2021-09-25 DIAGNOSIS — Z79899 Other long term (current) drug therapy: Secondary | ICD-10-CM | POA: Diagnosis not present

## 2021-09-25 DIAGNOSIS — Z85828 Personal history of other malignant neoplasm of skin: Secondary | ICD-10-CM | POA: Diagnosis not present

## 2021-09-25 DIAGNOSIS — Z86018 Personal history of other benign neoplasm: Secondary | ICD-10-CM | POA: Diagnosis not present

## 2021-09-25 DIAGNOSIS — L814 Other melanin hyperpigmentation: Secondary | ICD-10-CM | POA: Diagnosis not present

## 2021-09-25 DIAGNOSIS — Z01818 Encounter for other preprocedural examination: Secondary | ICD-10-CM | POA: Diagnosis not present

## 2021-09-28 DIAGNOSIS — I10 Essential (primary) hypertension: Secondary | ICD-10-CM | POA: Diagnosis not present

## 2021-09-28 DIAGNOSIS — S76201A Unspecified injury of adductor muscle, fascia and tendon of right thigh, initial encounter: Secondary | ICD-10-CM | POA: Diagnosis not present

## 2021-09-28 DIAGNOSIS — M4316 Spondylolisthesis, lumbar region: Secondary | ICD-10-CM | POA: Diagnosis not present

## 2021-10-02 DIAGNOSIS — G588 Other specified mononeuropathies: Secondary | ICD-10-CM | POA: Diagnosis not present

## 2021-10-02 DIAGNOSIS — M79651 Pain in right thigh: Secondary | ICD-10-CM | POA: Diagnosis not present

## 2021-10-04 DIAGNOSIS — R1031 Right lower quadrant pain: Secondary | ICD-10-CM | POA: Diagnosis not present

## 2021-10-04 DIAGNOSIS — R1030 Lower abdominal pain, unspecified: Secondary | ICD-10-CM | POA: Diagnosis not present

## 2021-10-08 DIAGNOSIS — M4316 Spondylolisthesis, lumbar region: Secondary | ICD-10-CM | POA: Diagnosis not present

## 2021-10-11 DIAGNOSIS — S76201A Unspecified injury of adductor muscle, fascia and tendon of right thigh, initial encounter: Secondary | ICD-10-CM | POA: Diagnosis not present

## 2021-10-18 ENCOUNTER — Telehealth: Payer: Self-pay

## 2021-10-18 NOTE — Telephone Encounter (Signed)
Per Dr. Burt Knack, scheduled the patient for 1 year echo and office visit on 02/01/2022. The patient was grateful for call and agrees with plan.

## 2021-10-19 DIAGNOSIS — H8109 Meniere's disease, unspecified ear: Secondary | ICD-10-CM | POA: Diagnosis not present

## 2021-10-19 DIAGNOSIS — Z79631 Long term (current) use of antimetabolite agent: Secondary | ICD-10-CM | POA: Diagnosis not present

## 2021-10-19 DIAGNOSIS — R768 Other specified abnormal immunological findings in serum: Secondary | ICD-10-CM | POA: Diagnosis not present

## 2021-10-19 DIAGNOSIS — Z7952 Long term (current) use of systemic steroids: Secondary | ICD-10-CM | POA: Diagnosis not present

## 2021-10-19 DIAGNOSIS — Z79899 Other long term (current) drug therapy: Secondary | ICD-10-CM | POA: Diagnosis not present

## 2021-10-19 DIAGNOSIS — H8103 Meniere's disease, bilateral: Secondary | ICD-10-CM | POA: Diagnosis not present

## 2021-11-01 DIAGNOSIS — M5116 Intervertebral disc disorders with radiculopathy, lumbar region: Secondary | ICD-10-CM | POA: Diagnosis not present

## 2021-11-01 DIAGNOSIS — M5416 Radiculopathy, lumbar region: Secondary | ICD-10-CM | POA: Diagnosis not present

## 2021-11-06 DIAGNOSIS — M5441 Lumbago with sciatica, right side: Secondary | ICD-10-CM | POA: Diagnosis not present

## 2021-11-08 DIAGNOSIS — H8103 Meniere's disease, bilateral: Secondary | ICD-10-CM | POA: Diagnosis not present

## 2021-11-19 DIAGNOSIS — M545 Low back pain, unspecified: Secondary | ICD-10-CM | POA: Diagnosis not present

## 2021-11-19 DIAGNOSIS — M5440 Lumbago with sciatica, unspecified side: Secondary | ICD-10-CM | POA: Diagnosis not present

## 2021-11-19 DIAGNOSIS — S3981XS Other specified injuries of abdomen, sequela: Secondary | ICD-10-CM | POA: Diagnosis not present

## 2021-11-19 DIAGNOSIS — R531 Weakness: Secondary | ICD-10-CM | POA: Diagnosis not present

## 2021-11-19 DIAGNOSIS — I480 Paroxysmal atrial fibrillation: Secondary | ICD-10-CM | POA: Diagnosis not present

## 2021-11-19 DIAGNOSIS — R1031 Right lower quadrant pain: Secondary | ICD-10-CM | POA: Diagnosis not present

## 2021-11-19 DIAGNOSIS — S3981XD Other specified injuries of abdomen, subsequent encounter: Secondary | ICD-10-CM | POA: Diagnosis not present

## 2021-11-19 DIAGNOSIS — G8929 Other chronic pain: Secondary | ICD-10-CM | POA: Diagnosis not present

## 2021-11-19 DIAGNOSIS — Z87891 Personal history of nicotine dependence: Secondary | ICD-10-CM | POA: Diagnosis not present

## 2021-11-30 DIAGNOSIS — Z20822 Contact with and (suspected) exposure to covid-19: Secondary | ICD-10-CM | POA: Diagnosis not present

## 2021-12-04 DIAGNOSIS — M5117 Intervertebral disc disorders with radiculopathy, lumbosacral region: Secondary | ICD-10-CM | POA: Diagnosis not present

## 2021-12-04 DIAGNOSIS — S3981XS Other specified injuries of abdomen, sequela: Secondary | ICD-10-CM | POA: Diagnosis not present

## 2021-12-04 DIAGNOSIS — M48061 Spinal stenosis, lumbar region without neurogenic claudication: Secondary | ICD-10-CM | POA: Diagnosis not present

## 2021-12-04 DIAGNOSIS — R609 Edema, unspecified: Secondary | ICD-10-CM | POA: Diagnosis not present

## 2021-12-04 DIAGNOSIS — M5127 Other intervertebral disc displacement, lumbosacral region: Secondary | ICD-10-CM | POA: Diagnosis not present

## 2021-12-04 DIAGNOSIS — N400 Enlarged prostate without lower urinary tract symptoms: Secondary | ICD-10-CM | POA: Diagnosis not present

## 2021-12-04 DIAGNOSIS — G8929 Other chronic pain: Secondary | ICD-10-CM | POA: Diagnosis not present

## 2021-12-04 DIAGNOSIS — M5116 Intervertebral disc disorders with radiculopathy, lumbar region: Secondary | ICD-10-CM | POA: Diagnosis not present

## 2021-12-04 DIAGNOSIS — R1031 Right lower quadrant pain: Secondary | ICD-10-CM | POA: Diagnosis not present

## 2021-12-04 DIAGNOSIS — M4856XA Collapsed vertebra, not elsewhere classified, lumbar region, initial encounter for fracture: Secondary | ICD-10-CM | POA: Diagnosis not present

## 2021-12-04 DIAGNOSIS — M47816 Spondylosis without myelopathy or radiculopathy, lumbar region: Secondary | ICD-10-CM | POA: Diagnosis not present

## 2021-12-04 DIAGNOSIS — M5440 Lumbago with sciatica, unspecified side: Secondary | ICD-10-CM | POA: Diagnosis not present

## 2021-12-04 DIAGNOSIS — N433 Hydrocele, unspecified: Secondary | ICD-10-CM | POA: Diagnosis not present

## 2021-12-04 DIAGNOSIS — M5124 Other intervertebral disc displacement, thoracic region: Secondary | ICD-10-CM | POA: Diagnosis not present

## 2021-12-04 DIAGNOSIS — M4316 Spondylolisthesis, lumbar region: Secondary | ICD-10-CM | POA: Diagnosis not present

## 2021-12-04 DIAGNOSIS — M5115 Intervertebral disc disorders with radiculopathy, thoracolumbar region: Secondary | ICD-10-CM | POA: Diagnosis not present

## 2021-12-04 DIAGNOSIS — R1030 Lower abdominal pain, unspecified: Secondary | ICD-10-CM | POA: Diagnosis not present

## 2021-12-06 DIAGNOSIS — Z79899 Other long term (current) drug therapy: Secondary | ICD-10-CM | POA: Diagnosis not present

## 2021-12-06 DIAGNOSIS — G43109 Migraine with aura, not intractable, without status migrainosus: Secondary | ICD-10-CM | POA: Diagnosis not present

## 2021-12-06 DIAGNOSIS — H8103 Meniere's disease, bilateral: Secondary | ICD-10-CM | POA: Diagnosis not present

## 2021-12-06 DIAGNOSIS — H90A21 Sensorineural hearing loss, unilateral, right ear, with restricted hearing on the contralateral side: Secondary | ICD-10-CM | POA: Diagnosis not present

## 2021-12-06 DIAGNOSIS — H6982 Other specified disorders of Eustachian tube, left ear: Secondary | ICD-10-CM | POA: Diagnosis not present

## 2021-12-06 DIAGNOSIS — F32A Depression, unspecified: Secondary | ICD-10-CM | POA: Diagnosis not present

## 2021-12-06 DIAGNOSIS — H903 Sensorineural hearing loss, bilateral: Secondary | ICD-10-CM | POA: Diagnosis not present

## 2021-12-06 DIAGNOSIS — H9313 Tinnitus, bilateral: Secondary | ICD-10-CM | POA: Diagnosis not present

## 2021-12-06 DIAGNOSIS — Z79631 Long term (current) use of antimetabolite agent: Secondary | ICD-10-CM | POA: Diagnosis not present

## 2021-12-06 DIAGNOSIS — H90A32 Mixed conductive and sensorineural hearing loss, unilateral, left ear with restricted hearing on the contralateral side: Secondary | ICD-10-CM | POA: Diagnosis not present

## 2021-12-06 DIAGNOSIS — Z7901 Long term (current) use of anticoagulants: Secondary | ICD-10-CM | POA: Diagnosis not present

## 2021-12-06 DIAGNOSIS — H6992 Unspecified Eustachian tube disorder, left ear: Secondary | ICD-10-CM | POA: Diagnosis not present

## 2021-12-06 DIAGNOSIS — I4891 Unspecified atrial fibrillation: Secondary | ICD-10-CM | POA: Diagnosis not present

## 2021-12-06 DIAGNOSIS — Z4589 Encounter for adjustment and management of other implanted devices: Secondary | ICD-10-CM | POA: Diagnosis not present

## 2021-12-07 DIAGNOSIS — S76201A Unspecified injury of adductor muscle, fascia and tendon of right thigh, initial encounter: Secondary | ICD-10-CM | POA: Diagnosis not present

## 2021-12-24 DIAGNOSIS — G8929 Other chronic pain: Secondary | ICD-10-CM | POA: Diagnosis not present

## 2021-12-24 DIAGNOSIS — Z96641 Presence of right artificial hip joint: Secondary | ICD-10-CM | POA: Diagnosis not present

## 2021-12-24 DIAGNOSIS — Z981 Arthrodesis status: Secondary | ICD-10-CM | POA: Diagnosis not present

## 2021-12-24 DIAGNOSIS — Z87891 Personal history of nicotine dependence: Secondary | ICD-10-CM | POA: Diagnosis not present

## 2021-12-24 DIAGNOSIS — M47816 Spondylosis without myelopathy or radiculopathy, lumbar region: Secondary | ICD-10-CM | POA: Diagnosis not present

## 2021-12-24 DIAGNOSIS — M4854XA Collapsed vertebra, not elsewhere classified, thoracic region, initial encounter for fracture: Secondary | ICD-10-CM | POA: Diagnosis not present

## 2021-12-24 DIAGNOSIS — M25551 Pain in right hip: Secondary | ICD-10-CM | POA: Diagnosis not present

## 2021-12-25 DIAGNOSIS — D692 Other nonthrombocytopenic purpura: Secondary | ICD-10-CM | POA: Diagnosis not present

## 2021-12-25 DIAGNOSIS — M545 Low back pain, unspecified: Secondary | ICD-10-CM | POA: Diagnosis not present

## 2021-12-25 DIAGNOSIS — L0889 Other specified local infections of the skin and subcutaneous tissue: Secondary | ICD-10-CM | POA: Diagnosis not present

## 2021-12-25 DIAGNOSIS — D485 Neoplasm of uncertain behavior of skin: Secondary | ICD-10-CM | POA: Diagnosis not present

## 2021-12-25 DIAGNOSIS — C44529 Squamous cell carcinoma of skin of other part of trunk: Secondary | ICD-10-CM | POA: Diagnosis not present

## 2021-12-28 DIAGNOSIS — Z20822 Contact with and (suspected) exposure to covid-19: Secondary | ICD-10-CM | POA: Diagnosis not present

## 2022-01-04 DIAGNOSIS — F5101 Primary insomnia: Secondary | ICD-10-CM | POA: Diagnosis not present

## 2022-01-04 DIAGNOSIS — Z20822 Contact with and (suspected) exposure to covid-19: Secondary | ICD-10-CM | POA: Diagnosis not present

## 2022-01-04 DIAGNOSIS — I350 Nonrheumatic aortic (valve) stenosis: Secondary | ICD-10-CM | POA: Diagnosis not present

## 2022-01-04 DIAGNOSIS — I10 Essential (primary) hypertension: Secondary | ICD-10-CM | POA: Diagnosis not present

## 2022-01-04 DIAGNOSIS — Z8679 Personal history of other diseases of the circulatory system: Secondary | ICD-10-CM | POA: Diagnosis not present

## 2022-01-04 DIAGNOSIS — Z01818 Encounter for other preprocedural examination: Secondary | ICD-10-CM | POA: Diagnosis not present

## 2022-01-04 DIAGNOSIS — E7800 Pure hypercholesterolemia, unspecified: Secondary | ICD-10-CM | POA: Diagnosis not present

## 2022-01-09 DIAGNOSIS — Z20822 Contact with and (suspected) exposure to covid-19: Secondary | ICD-10-CM | POA: Diagnosis not present

## 2022-01-10 DIAGNOSIS — Z20822 Contact with and (suspected) exposure to covid-19: Secondary | ICD-10-CM | POA: Diagnosis not present

## 2022-01-10 DIAGNOSIS — C44521 Squamous cell carcinoma of skin of breast: Secondary | ICD-10-CM | POA: Diagnosis not present

## 2022-01-10 DIAGNOSIS — C4492 Squamous cell carcinoma of skin, unspecified: Secondary | ICD-10-CM | POA: Diagnosis not present

## 2022-01-10 DIAGNOSIS — L905 Scar conditions and fibrosis of skin: Secondary | ICD-10-CM | POA: Diagnosis not present

## 2022-01-10 DIAGNOSIS — H8103 Meniere's disease, bilateral: Secondary | ICD-10-CM | POA: Diagnosis not present

## 2022-01-20 DIAGNOSIS — T148XXA Other injury of unspecified body region, initial encounter: Secondary | ICD-10-CM | POA: Diagnosis not present

## 2022-01-20 DIAGNOSIS — Z6823 Body mass index (BMI) 23.0-23.9, adult: Secondary | ICD-10-CM | POA: Diagnosis not present

## 2022-01-20 DIAGNOSIS — L089 Local infection of the skin and subcutaneous tissue, unspecified: Secondary | ICD-10-CM | POA: Diagnosis not present

## 2022-02-01 ENCOUNTER — Encounter (HOSPITAL_COMMUNITY): Payer: Self-pay

## 2022-02-01 ENCOUNTER — Ambulatory Visit (HOSPITAL_COMMUNITY): Payer: Medicare Other

## 2022-02-01 ENCOUNTER — Ambulatory Visit: Payer: Medicare Other | Admitting: Cardiovascular Disease

## 2022-02-07 ENCOUNTER — Telehealth: Payer: Self-pay | Admitting: Internal Medicine

## 2022-02-07 NOTE — Telephone Encounter (Signed)
Scheduled appt per 6/1 referral. Pt is aware of appt date and time. Pt is aware to arrive 15 mins prior to appt time and to bring and updated insurance card. Pt is aware of appt location.

## 2022-02-11 DIAGNOSIS — T148XXA Other injury of unspecified body region, initial encounter: Secondary | ICD-10-CM | POA: Diagnosis not present

## 2022-02-11 DIAGNOSIS — F419 Anxiety disorder, unspecified: Secondary | ICD-10-CM | POA: Diagnosis not present

## 2022-02-11 DIAGNOSIS — R634 Abnormal weight loss: Secondary | ICD-10-CM | POA: Diagnosis not present

## 2022-02-11 DIAGNOSIS — Z118 Encounter for screening for other infectious and parasitic diseases: Secondary | ICD-10-CM | POA: Diagnosis not present

## 2022-02-11 DIAGNOSIS — R21 Rash and other nonspecific skin eruption: Secondary | ICD-10-CM | POA: Diagnosis not present

## 2022-02-11 DIAGNOSIS — R5381 Other malaise: Secondary | ICD-10-CM | POA: Diagnosis not present

## 2022-02-11 DIAGNOSIS — F5101 Primary insomnia: Secondary | ICD-10-CM | POA: Diagnosis not present

## 2022-02-11 DIAGNOSIS — Z113 Encounter for screening for infections with a predominantly sexual mode of transmission: Secondary | ICD-10-CM | POA: Diagnosis not present

## 2022-02-22 ENCOUNTER — Other Ambulatory Visit: Payer: Self-pay | Admitting: Medical Oncology

## 2022-02-22 DIAGNOSIS — R233 Spontaneous ecchymoses: Secondary | ICD-10-CM

## 2022-02-25 ENCOUNTER — Other Ambulatory Visit: Payer: Self-pay

## 2022-02-25 ENCOUNTER — Inpatient Hospital Stay: Payer: Medicare Other | Attending: Internal Medicine | Admitting: Internal Medicine

## 2022-02-25 ENCOUNTER — Other Ambulatory Visit: Payer: Self-pay | Admitting: Internal Medicine

## 2022-02-25 ENCOUNTER — Other Ambulatory Visit: Payer: Self-pay | Admitting: Lab

## 2022-02-25 ENCOUNTER — Inpatient Hospital Stay: Payer: Medicare Other

## 2022-02-25 VITALS — BP 159/102 | HR 119 | Temp 98.0°F | Resp 16 | Ht 68.0 in | Wt 149.8 lb

## 2022-02-25 DIAGNOSIS — R75 Inconclusive laboratory evidence of human immunodeficiency virus [HIV]: Secondary | ICD-10-CM

## 2022-02-25 DIAGNOSIS — R7309 Other abnormal glucose: Secondary | ICD-10-CM | POA: Diagnosis not present

## 2022-02-25 DIAGNOSIS — Q821 Xeroderma pigmentosum: Secondary | ICD-10-CM | POA: Diagnosis not present

## 2022-02-25 DIAGNOSIS — Z7253 High risk bisexual behavior: Secondary | ICD-10-CM

## 2022-02-25 DIAGNOSIS — R5382 Chronic fatigue, unspecified: Secondary | ICD-10-CM

## 2022-02-25 DIAGNOSIS — Z79899 Other long term (current) drug therapy: Secondary | ICD-10-CM | POA: Diagnosis not present

## 2022-02-25 DIAGNOSIS — R233 Spontaneous ecchymoses: Secondary | ICD-10-CM

## 2022-02-25 DIAGNOSIS — Z0000 Encounter for general adult medical examination without abnormal findings: Secondary | ICD-10-CM

## 2022-02-25 DIAGNOSIS — H8109 Meniere's disease, unspecified ear: Secondary | ICD-10-CM | POA: Insufficient documentation

## 2022-02-25 DIAGNOSIS — Z113 Encounter for screening for infections with a predominantly sexual mode of transmission: Secondary | ICD-10-CM | POA: Diagnosis not present

## 2022-02-25 DIAGNOSIS — R634 Abnormal weight loss: Secondary | ICD-10-CM | POA: Diagnosis not present

## 2022-02-25 DIAGNOSIS — Z87891 Personal history of nicotine dependence: Secondary | ICD-10-CM | POA: Diagnosis not present

## 2022-02-25 DIAGNOSIS — Z7952 Long term (current) use of systemic steroids: Secondary | ICD-10-CM | POA: Diagnosis not present

## 2022-02-25 DIAGNOSIS — Z206 Contact with and (suspected) exposure to human immunodeficiency virus [HIV]: Secondary | ICD-10-CM

## 2022-02-25 DIAGNOSIS — R071 Chest pain on breathing: Secondary | ICD-10-CM | POA: Diagnosis not present

## 2022-02-25 DIAGNOSIS — Z79631 Long term (current) use of antimetabolite agent: Secondary | ICD-10-CM | POA: Insufficient documentation

## 2022-02-25 DIAGNOSIS — R58 Hemorrhage, not elsewhere classified: Secondary | ICD-10-CM | POA: Insufficient documentation

## 2022-02-25 LAB — CBC WITH DIFFERENTIAL (CANCER CENTER ONLY)
Abs Immature Granulocytes: 0.02 10*3/uL (ref 0.00–0.07)
Basophils Absolute: 0 10*3/uL (ref 0.0–0.1)
Basophils Relative: 0 %
Eosinophils Absolute: 0 10*3/uL (ref 0.0–0.5)
Eosinophils Relative: 0 %
HCT: 41.1 % (ref 39.0–52.0)
Hemoglobin: 14.4 g/dL (ref 13.0–17.0)
Immature Granulocytes: 0 %
Lymphocytes Relative: 17 %
Lymphs Abs: 1.4 10*3/uL (ref 0.7–4.0)
MCH: 30 pg (ref 26.0–34.0)
MCHC: 35 g/dL (ref 30.0–36.0)
MCV: 85.6 fL (ref 80.0–100.0)
Monocytes Absolute: 0.5 10*3/uL (ref 0.1–1.0)
Monocytes Relative: 7 %
Neutro Abs: 6.2 10*3/uL (ref 1.7–7.7)
Neutrophils Relative %: 76 %
Platelet Count: 298 10*3/uL (ref 150–400)
RBC: 4.8 MIL/uL (ref 4.22–5.81)
RDW: 12.2 % (ref 11.5–15.5)
WBC Count: 8.2 10*3/uL (ref 4.0–10.5)
nRBC: 0 % (ref 0.0–0.2)

## 2022-02-25 LAB — CMP (CANCER CENTER ONLY)
ALT: 18 U/L (ref 0–44)
AST: 18 U/L (ref 15–41)
Albumin: 4.3 g/dL (ref 3.5–5.0)
Alkaline Phosphatase: 75 U/L (ref 38–126)
Anion gap: 6 (ref 5–15)
BUN: 19 mg/dL (ref 8–23)
CO2: 27 mmol/L (ref 22–32)
Calcium: 9.8 mg/dL (ref 8.9–10.3)
Chloride: 106 mmol/L (ref 98–111)
Creatinine: 1.21 mg/dL (ref 0.61–1.24)
GFR, Estimated: >60 mL/min (ref >60)
Glucose, Bld: 142 mg/dL — ABNORMAL HIGH (ref 70–99)
Potassium: 4.3 mmol/L (ref 3.5–5.1)
Sodium: 139 mmol/L (ref 135–145)
Total Bilirubin: 0.4 mg/dL (ref 0.3–1.2)
Total Protein: 7.2 g/dL (ref 6.5–8.1)

## 2022-02-25 LAB — PROTIME-INR
INR: 1 (ref 0.8–1.2)
Prothrombin Time: 12.9 seconds (ref 11.4–15.2)

## 2022-02-25 LAB — APTT: aPTT: 33 seconds (ref 24–36)

## 2022-02-25 NOTE — Progress Notes (Signed)
[place] [phone]   [phone]  CONSULT NOTE  REFERRING PHYSICIAN: Dr. Jani Gravel  REASON FOR CONSULTATION:  76 years old white male with easy bruising  HPI Cody Mcconnell is a 76 y.o. male with past medical history significant for benign prostatic hypertrophy, coronary artery disease, chronic low back pain, prediabetes, atrial fibrillation, dyslipidemia, anemia as well as aortic valve stenosis.  The patient was seen by his primary care provider complaining of easy bruising that has been going on for a while but getting worse.  He also mentions that he lost around 30 pounds in the last 2 to 3 months.  He has been on treatment in the past with methotrexate and prednisone for suspicious rheumatoid arthritis.  He also has Mnire's disease.  He has not received any prednisone for the last few months.  He was concerned about possibility of HIV with his skin rash and he was tested by his primary care provider and this was reported to be negative.  He has been depressed for the last 2 to 3 years after having 8 surgeries recently.  The last one was in December 2022.  He has not been seen by psychiatry yet.  Because of the easy bruising he was referred to me today for evaluation and recommendation regarding his condition. When seen today he is feeling fine except for the weight loss and the bruising.  He denied having any current chest pain, shortness of breath, cough or hemoptysis.  He denied having any fever or chills.  He has no nausea, vomiting, diarrhea or constipation.  He has no headache or visual changes.  The patient has no gum bleeding, epistaxis, hemoptysis, hematemesis, melena or hematochezia. Family history significant for mother with dementia and father with congestive heart failure and coronary artery disease. The patient is married and has 2 children.  He used to work in [employer].  He has no history of smoking, alcohol or drug  abuse.  HPI  Past Medical History:  Diagnosis Date   Anemia    SLIGHT ANEMIA 4 MONTHS AGO   Aortic valve stenosis 12/19/2017   Echo 07/30/16 - EF 55-60, mild aortic stenosis (mean 9) // Echo 4/19:  EF 60-65 mild AS (mean 11) // Echocardiogram 11/21: EF 60-65, no RWMA, mild LVH, Gr 1 DD, normal RVSF, trivial MR, calcified AV, mod AS (mean 20 mmHg, Vmax 280 cm/s, DI 0.27), trivial AI  // Echo 5/22: EF 60-65, moderate LVH, no RWMA, trivial MR, moderate AS (mean gradient 20 mmHg, V-max 287 cm/s, DI 0.35)    Arthritis    OA   Benign prostate hyperplasia    unspecified whether lower urinary tract sym. present    Carotid atherosclerosis    Carotid US 4/19:  bilat ICA 1-39; vertebrals and subclavians normal   Carotid atherosclerosis, bilateral 04/30/2018   1-39% on 12/2017 [facility] Echo   Coronary artery calcification seen on CT scan 06/05/2018   Dysrhythmia    ATRIAL FIB   Elevated prostate specific antigen (PSA) 08/30/2016   Glucose intolerance    "pre-diabetic"   Hayfever    Heart murmur    History of exercise stress test    GXT 4/19:  ETT with good exercise tolerance (10:00); no chest pain; normal BP response; no diagnostic ST changes; negative adequate ETT.   HOH (hard of hearing)    BOTH EARS   Low back pain with sciatica 06/02/2012   Lumbar herniated disc 01/30/2018  Mild aortic stenosis 04/30/2018   12/2017 [facility] Every[facility] Echo : Mean gradient (S): 11 mm Hg. Peak gradient (S): 24 mm Hg.   Numbness of right thumb    FROM CERVICAL NECK SURGERY   Osteoarthritis of right hip 09/21/2018   Overweight (BMI 25.0-29.9) 09/30/2018   Paroxysmal atrial fibrillation ([place]) 06/05/2018   Pre-diabetes    Prediabetes 04/30/2018   not sure about this   Prostatitis    unspecified prostatitis type   Pure hypercholesterolemia     Past Surgical History:  Procedure Laterality Date   ANTERIOR LAT LUMBAR FUSION Right 06/29/2020   Procedure: Right Lumbar Two-Three Anterolateral lumbar  interbody fusion with lateral plate;  Surgeon: Erline Levine, MD;  Location: [facility];  Service: Neurosurgery;  Laterality: Right;  Right Lumbar Two-Three Anterolateral lumbar interbody fusion with lateral plate   CERVICAL SPINE SURGERY  2011   COLONOSCOPY     HIP SURGERY Right    arthroscopy   LUMBAR DISC SURGERY  2014   L2-3, L3 TO L4 X 2 2019 AT [facility]   LUMBAR FUSION  2019   psoas muscle repair (groin muscle?)  2022   SHOULDER ARTHROSCOPY Right    TOOTH EXTRACTION     TOTAL HIP ARTHROPLASTY Right 09/29/2018   Procedure: TOTAL HIP ARTHROPLASTY ANTERIOR APPROACH;  Surgeon: Paralee Cancel, MD;  Location: [facility];  Service: Orthopedics;  Laterality: Right;  45    Family History  Problem Relation Age of Onset   Dementia Mother    Congestive Heart Failure Father    CAD Father        quad bypass at age 79   CAD Brother 32    Social History Social History   Tobacco Use   Smoking status: Former    Packs/day: 2.00    Years: 9.00    Total pack years: 18.00    Types: Cigarettes    Quit date: 1978    Years since quitting: 45.4   Smokeless tobacco: Never   Tobacco comments:    QUIT   Vaping Use   Vaping Use: Never used  Substance Use Topics   Alcohol use: Never   Drug use: Never    No Known Allergies  Current Outpatient Medications  Medication Sig Dispense Refill   acetaminophen (TYLENOL) 325 MG tablet Take 650 mg by mouth every 6 (six) hours as needed for moderate pain.     cetirizine (ZYRTEC) 10 MG tablet Take 10 mg by mouth every other day.     diltiazem (CARDIZEM CD) 120 MG 24 hr capsule TAKE 1 CAPSULE(120 MG) BY MOUTH DAILY (Patient not taking: Reported on 07/31/2021) 90 capsule 2   folic acid (FOLVITE) 1 MG tablet Take 1 mg by mouth daily.     hydrochlorothiazide (HYDRODIURIL) 12.5 MG tablet Take 12.5 mg by mouth daily.     methocarbamol (ROBAXIN) 500 MG tablet Take 1 tablet (500 mg total) by mouth 4 (four) times daily. 30 tablet 3   methotrexate (RHEUMATREX) 2.5 MG  tablet Take 7.5 mg by mouth every Tuesday.     montelukast (SINGULAIR) 10 MG tablet Take 10 mg by mouth at bedtime.     oxyCODONE-acetaminophen (PERCOCET/ROXICET) 5-325 MG tablet Take 1-2 tablets by mouth every 4 (four) hours as needed for moderate pain or severe pain. 30 tablet 0   predniSONE (DELTASONE) 5 MG tablet Take 5 mg by mouth every other day.     sildenafil (VIAGRA) 100 MG tablet Take 100 mg by mouth as needed for erectile dysfunction.  simvastatin (ZOCOR) 40 MG tablet Take 40 mg by mouth daily.     tadalafil (CIALIS) 20 MG tablet Take 20 mg by mouth daily as needed for erectile dysfunction.     triamterene-hydrochlorothiazide (DYAZIDE) 37.5-25 MG capsule Take 1 capsule by mouth every morning.     No current facility-administered medications for this visit.    Review of Systems  Constitutional: positive for fatigue and weight loss Eyes: negative Ears, nose, mouth, throat, and face: negative Respiratory: negative Cardiovascular: negative Gastrointestinal: negative Genitourinary:negative Integument/breast: positive for rash Hematologic/lymphatic: positive for easy bruising Musculoskeletal:negative Neurological: negative Behavioral/Psych: negative Endocrine: negative Allergic/Immunologic: negative  Physical Exam  ELF:YBOFB, healthy, no distress, well nourished, well developed, and anxious SKIN: skin color, texture, turgor are normal, no rashes or significant lesions HEAD: Normocephalic, No masses, lesions, tenderness or abnormalities EYES: normal, PERRLA, Conjunctiva are pink and non-injected EARS: External ears normal, Canals clear OROPHARYNX:no exudate, no erythema, and lips, buccal mucosa, and tongue normal  NECK: supple, no adenopathy, no JVD LYMPH:  no palpable lymphadenopathy, no hepatosplenomegaly BREAST:not examined LUNGS: clear to auscultation , and palpation HEART: regular rate & rhythm, no murmurs, and no gallops ABDOMEN:abdomen soft, non-tender, normal  bowel sounds, and no masses or organomegaly BACK: Back symmetric, no curvature., No CVA tenderness EXTREMITIES:no joint deformities, effusion, or inflammation, no edema  NEURO: alert & oriented x 3 with fluent speech, no focal motor/sensory deficits  PERFORMANCE STATUS: ECOG 1  LABORATORY DATA: Lab Results  Component Value Date   WBC 8.2 02/25/2022   HGB 14.4 02/25/2022   HCT 41.1 02/25/2022   MCV 85.6 02/25/2022   PLT 298 02/25/2022      Chemistry      Component Value Date/Time   NA 139 02/25/2022 1105   NA 139 06/09/2019 1120   K 4.3 02/25/2022 1105   CL 106 02/25/2022 1105   CO2 27 02/25/2022 1105   BUN 19 02/25/2022 1105   BUN 16 06/09/2019 1120   CREATININE 1.21 02/25/2022 1105      Component Value Date/Time   CALCIUM 9.8 02/25/2022 1105   ALKPHOS 75 02/25/2022 1105   AST 18 02/25/2022 1105   ALT 18 02/25/2022 1105   BILITOT 0.4 02/25/2022 1105       RADIOGRAPHIC STUDIES: No results found.  ASSESSMENT: This is a very pleasant 76 years old white male presented for evaluation of easy bruising and some ecchymosis in the upper extremity likely secondary to senile purpura but other underlying causes could not be ruled out at this point.  The patient also has significant weight loss recently and he is concerned about the weight loss.   PLAN: I had a lengthy discussion with the patient today about his current condition and further investigation to identify any underlying etiology for his easy bruising or weight loss. I repeated several studies today including repeat CBC, comprehensive metabolic panel, PT, PTT and these were all reported to be normal except for the elevated nonfasting blood glucose of 142. I think his bruising is secondary to senile purpura with thin skin after treatment with methotrexate and long treatment with the steroids.  He was also concerned about possibility of HIV but he was treated by his primary care provider and this was negative.  I try to  repeat the test at the cancer center today but the associated diagnosis was not accepted by Medicare. For the weight loss I will order CT scan of the chest, abdomen pelvis to rule out any occult malignancy.  His weight loss  could be also secondary to depression and I strongly advised the patient to see a psychiatrist at some point for evaluation of his condition. If CT scan of the chest, abdomen and pelvis showed no concerning findings, the patient would continue his routine follow-up visit and evaluation by his primary care physician and I will see him on as-needed basis in the future. The patient was advised to call immediately if he has any other concerning issues in the interval. The patient voices understanding of current disease status and treatment options and is in agreement with the current care plan.  All questions were answered. The patient knows to call the clinic with any problems, questions or concerns. We can certainly see the patient much sooner if necessary.  Thank you so much for allowing me to participate in the care of Cody Mcconnell. I will continue to follow up the patient with you and assist in his care.  The total time spent in the appointment was 60 minutes.  Disclaimer: This note was dictated with voice recognition software. Similar sounding words can inadvertently be transcribed and may not be corrected upon review.   Eilleen Kempf February 25, 2022, 12:01 PM

## 2022-02-28 ENCOUNTER — Other Ambulatory Visit (HOSPITAL_COMMUNITY): Payer: Self-pay | Admitting: Cardiovascular Disease

## 2022-02-28 ENCOUNTER — Ambulatory Visit (INDEPENDENT_AMBULATORY_CARE_PROVIDER_SITE_OTHER): Payer: Medicare Other | Admitting: Cardiovascular Disease

## 2022-02-28 ENCOUNTER — Ambulatory Visit (HOSPITAL_COMMUNITY): Payer: Medicare Other | Attending: Cardiology

## 2022-02-28 ENCOUNTER — Encounter: Payer: Self-pay | Admitting: Cardiovascular Disease

## 2022-02-28 VITALS — BP 110/68 | HR 96 | Ht 68.5 in | Wt 150.0 lb

## 2022-02-28 DIAGNOSIS — I350 Nonrheumatic aortic (valve) stenosis: Secondary | ICD-10-CM

## 2022-02-28 DIAGNOSIS — I480 Paroxysmal atrial fibrillation: Secondary | ICD-10-CM | POA: Diagnosis not present

## 2022-02-28 LAB — ECHOCARDIOGRAM COMPLETE
AR max vel: 1.15 cm2
AV Area VTI: 1.05 cm2
AV Area mean vel: 1.03 cm2
AV Mean grad: 25.5 mmHg
AV Peak grad: 41.5 mmHg
Ao pk vel: 3.22 m/s
Area-P 1/2: 5.02 cm2
P 1/2 time: 390 msec
S' Lateral: 3.5 cm

## 2022-02-28 NOTE — Progress Notes (Signed)
Cardiology Office Note:    Date:  02/28/2022   ID:  Cody Mcconnell, DOB Aug 08, 1946, MRN [id number]  PCP:  Jani Gravel, MD   [facility] Providers Cardiologist:  Sherren Mocha, MD     Referring MD: Jani Gravel, MD   Chief Complaint  Patient presents with   Follow-up   Aortic Stenosis    History of Present Illness:    Cody Mcconnell is a 76 y.o. male with a hx of  paroxysmal atrial fibrillation and moderate aortic stenosis, presenting for follow-up evaluation.  Comorbid conditions include coronary artery calcification, hypertension, and mixed hyperlipidemia.  The patient has not had any episodes of atrial fibrillation over the past few years and he does not take any oral anticoagulant drugs.  He has had an aversion to taking these medications over time.   The patient is here alone today. He had back surgery in December and had a prolonged recovery, but now is doing better. 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  The patient is doing well from a cardiac perspective with no chest pain, chest pressure, shortness of breath, or heart palpitations.  He denies edema, orthopnea, or PND.  He has lost some weight since his surgery and is trying to stabilize his weight loss.  Past Medical History:  Diagnosis Date   Anemia    SLIGHT ANEMIA 4 MONTHS AGO   Aortic valve stenosis 12/19/2017   Echo 07/30/16 - EF 55-60, mild aortic stenosis (mean 9) // Echo 4/19:  EF 60-65 mild AS (mean 11) // Echocardiogram 11/21: EF 60-65, no RWMA, mild LVH, Gr 1 DD, normal RVSF, trivial MR, calcified AV, mod AS (mean 20 mmHg, Vmax 280 cm/s, DI 0.27), trivial AI  // Echo 5/22: EF 60-65, moderate LVH, no RWMA, trivial MR, moderate AS (mean gradient 20 mmHg, V-max 287 cm/s, DI 0.35)    Arthritis    OA   Benign prostate hyperplasia    unspecified whether lower urinary tract sym. present    Carotid atherosclerosis    Carotid US 4/19:  bilat ICA 1-39; vertebrals and subclavians normal   Carotid  atherosclerosis, bilateral 04/30/2018   1-39% on 12/2017 Care Everywhere Echo   Coronary artery calcification seen on CT scan 06/05/2018   Dysrhythmia    ATRIAL FIB   Elevated prostate specific antigen (PSA) 08/30/2016   Glucose intolerance    "pre-diabetic"   Hayfever    Heart murmur    History of exercise stress test    GXT 4/19:  ETT with good exercise tolerance (10:00); no chest pain; normal BP response; no diagnostic ST changes; negative adequate ETT.   HOH (hard of hearing)    BOTH EARS   Low back pain with sciatica 06/02/2012   Lumbar herniated disc 01/30/2018   Mild aortic stenosis 04/30/2018   12/2017 [facility] Everywhere Echo : Mean gradient (S): 11 mm Hg. Peak gradient (S): 24 mm Hg.   Numbness of right thumb    FROM CERVICAL NECK SURGERY   Osteoarthritis of right hip 09/21/2018   Overweight (BMI 25.0-29.9) 09/30/2018   Paroxysmal atrial fibrillation ([place]) 06/05/2018   Pre-diabetes    Prediabetes 04/30/2018   not sure about this   Prostatitis    unspecified prostatitis type   Pure hypercholesterolemia     Past Surgical History:  Procedure Laterality Date   ANTERIOR LAT LUMBAR FUSION Right 06/29/2020   Procedure: Right Lumbar Two-Three Anterolateral lumbar interbody fusion with lateral plate;  Surgeon: Erline Levine, MD;  Location: [facility];  Service: Neurosurgery;  Laterality: Right;  Right Lumbar Two-Three Anterolateral lumbar interbody fusion with lateral plate   CERVICAL SPINE SURGERY  2011   COLONOSCOPY     HIP SURGERY Right    arthroscopy   LUMBAR DISC SURGERY  2014   L2-3, L3 TO L4 X 2 2019 AT [facility]   LUMBAR FUSION  2019   psoas muscle repair (groin muscle?)  2022   SHOULDER ARTHROSCOPY Right    TOOTH EXTRACTION     TOTAL HIP ARTHROPLASTY Right 09/29/2018   Procedure: TOTAL HIP ARTHROPLASTY ANTERIOR APPROACH;  Surgeon: Paralee Cancel, MD;  Location: [facility];  Service: Orthopedics;  Laterality: Right;  71    Current Medications: Current Meds  Medication Sig    acetaminophen (TYLENOL) 325 MG tablet Take 650 mg by mouth every 6 (six) hours as needed for moderate pain.   ALPRAZolam (XANAX) 0.5 MG tablet Take 0.5 mg by mouth 2 (two) times daily as needed.   cetirizine (ZYRTEC) 10 MG tablet Take 10 mg by mouth every other day.   diltiazem (CARDIZEM CD) 120 MG 24 hr capsule TAKE 1 CAPSULE(120 MG) BY MOUTH DAILY   hydrochlorothiazide (HYDRODIURIL) 12.5 MG tablet Take 12.5 mg by mouth daily.   montelukast (SINGULAIR) 10 MG tablet Take 10 mg by mouth at bedtime.   oxyCODONE-acetaminophen (PERCOCET/ROXICET) 5-325 MG tablet Take 1-2 tablets by mouth every 4 (four) hours as needed for moderate pain or severe pain.   predniSONE (DELTASONE) 5 MG tablet Take 5 mg by mouth every other day.   simvastatin (ZOCOR) 40 MG tablet Take 40 mg by mouth daily.   triamterene-hydrochlorothiazide (DYAZIDE) 37.5-25 MG capsule Take 1 capsule by mouth every morning.     Allergies:   Patient has no known allergies.   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  ROS:   Please see the history of present illness.    All other systems reviewed and are negative.  EKGs/Labs/Other Studies Reviewed:  The following studies were reviewed today: Today's echo study is personally reviewed.  He has difficult parasternal windows.  LV function appears grossly normal.  The aortic valve is calcified and restricted with peak and mean transvalvular gradients of 42 and 28 mmHg, respectively.  The dimensionless index is 0.34 and calculated aortic valve area is 1 cm.  There is mild aortic insufficiency noted.  The formal interpretation is currently pending.  EKG:  EKG is ordered today.  The ekg ordered today demonstrates NSR 89 bpm, possible age-indeterminate   Recent Labs: 02/25/2022: ALT 18; BUN 19; Creatinine 1.21; Hemoglobin 14.4; Platelet Count 298; Potassium 4.3; Sodium 139  Recent Lipid Panel No results found for: "CHOL", "TRIG", "HDL", "CHOLHDL", "VLDL", "LDLCALC", "LDLDIRECT"   Risk Assessment/Calculations:    CHA2DS2-VASc Score = 3   This indicates a 3.2% annual risk of stroke. The patient's score is based upon: CHF History: 0 HTN History: 1 Diabetes History: 0 Stroke History: 0 Vascular Disease History: 0 Age Score: 2 Gender Score: 0          Physical Exam:    VS:  BP 110/68   Pulse 96   Ht 5' 8.5" (1.74 m)   Wt 150 lb (68 kg)   SpO2 98%   BMI 22.48 kg/m     Wt Readings from Last 3 Encounters:  02/28/22 150 lb (68 kg)  02/25/22 149 lb 12.8 oz (67.9 kg)  08/14/21 163 lb (73.9 kg)     GEN:  Well nourished, well developed in no acute distress HEENT: Normal NECK: No JVD; No carotid bruits LYMPHATICS: No  lymphadenopathy CARDIAC: RRR, 2/6 systolic murmur at the RUSB, preserved A2.  RESPIRATORY:  Clear to auscultation without rales, wheezing or rhonchi  ABDOMEN: Soft, non-tender, non-distended MUSCULOSKELETAL:  No edema; No deformity  SKIN: Warm and dry NEUROLOGIC:  Alert and oriented x 3 PSYCHIATRIC:  Normal affect   ASSESSMENT:    1. Nonrheumatic aortic valve stenosis   2. Paroxysmal atrial fibrillation (HCC)    PLAN:    In order of problems listed above:  The patient has moderate aortic stenosis.  I reviewed today's echo images as outlined above.  He does not have any cardiac-related symptoms at this point.  We discussed the natural history of aortic stenosis and potential symptoms that can arise, such as exertional dyspnea, chest discomfort, lightheadedness, and fatigue.  He will watch for symptoms and otherwise plan to follow-up in 1 year. The patient has not had any symptoms of atrial fibrillation and many years.  An event monitor in 2021 showed no atrial fibrillation.  He has not been anticoagulated and through shared decision making discussions in the past, is not interested in taking anticoagulation.  I asked him to keep an eye out for any heart palpitations and suggested that we would have a low threshold to check a heart monitor if needed.     Medication Adjustments/Labs and Tests Ordered: Current medicines are reviewed at length with the patient today.  Concerns regarding medicines are outlined above.  Orders Placed This Encounter  Procedures   EKG 12-Lead   ECHOCARDIOGRAM COMPLETE   No orders of the defined types were placed in this encounter.   Patient Instructions  Medication Instructions:  Your physician recommends that you continue on your current medications as directed. Please refer to the Current Medication list given to you today.  *If you need a refill on your cardiac medications before your next appointment, please call your pharmacy*   Lab Work: NONE  If  you have labs (blood work) drawn today and your tests are completely normal, you will receive your results only by: [place] (if you have MyChart) OR A paper copy in the mail If you have any lab test that is abnormal or we need to change your treatment, we will call you to review the results.   Testing/Procedures: ECHO (one week prior) Your physician has requested that you have an echocardiogram. Echocardiography is a painless test that uses sound waves to create images of your heart. It provides your doctor with information about the size and shape of your heart and how well your heart's chambers and valves are working. This procedure takes approximately one hour. There are no restrictions for this procedure.  Follow-Up: At [facility], you and your health needs are our priority.  As part of our continuing mission to provide you with exceptional heart care, we have created designated Provider Care Teams.  These Care Teams include your primary Cardiologist (physician) and Advanced Practice Providers (APPs -  Physician Assistants and Nurse Practitioners) who all work together to provide you with the care you need, when you need it.  Your next appointment:   1 year(s)  The format for your next appointment:   In Person  Provider:   Sherren Mocha, MD    Important Information About Sugar         Signed, Sherren Mocha, MD  02/28/2022 5:21 PM    [place]

## 2022-02-28 NOTE — Patient Instructions (Addendum)
Medication Instructions:  Your physician recommends that you continue on your current medications as directed. Please refer to the Current Medication list given to you today.  *If you need a refill on your cardiac medications before your next appointment, please call your pharmacy*   Lab Work: NONE If you have labs (blood work) drawn today and your tests are completely normal, you will receive your results only by: [place] (if you have MyChart) OR A paper copy in the mail If you have any lab test that is abnormal or we need to change your treatment, we will call you to review the results.   Testing/Procedures: ECHO (one week prior) Your physician has requested that you have an echocardiogram. Echocardiography is a painless test that uses sound waves to create images of your heart. It provides your doctor with information about the size and shape of your heart and how well your heart's chambers and valves are working. This procedure takes approximately one hour. There are no restrictions for this procedure.  Follow-Up: At [facility], you and your health needs are our priority.  As part of our continuing mission to provide you with exceptional heart care, we have created designated Provider Care Teams.  These Care Teams include your primary Cardiologist (physician) and Advanced Practice Providers (APPs -  Physician Assistants and Nurse Practitioners) who all work together to provide you with the care you need, when you need it.  Your next appointment:   1 year(s)  The format for your next appointment:   In Person  Provider:   Sherren Mocha, MD    Important Information About Sugar

## 2022-03-05 ENCOUNTER — Other Ambulatory Visit (HOSPITAL_COMMUNITY): Payer: Medicare Other

## 2022-06-07 DIAGNOSIS — H90A21 Sensorineural hearing loss, unilateral, right ear, with restricted hearing on the contralateral side: Secondary | ICD-10-CM | POA: Diagnosis not present

## 2022-06-07 DIAGNOSIS — H9312 Tinnitus, left ear: Secondary | ICD-10-CM | POA: Diagnosis not present

## 2022-06-07 DIAGNOSIS — H903 Sensorineural hearing loss, bilateral: Secondary | ICD-10-CM | POA: Diagnosis not present

## 2022-06-07 DIAGNOSIS — H8103 Meniere's disease, bilateral: Secondary | ICD-10-CM | POA: Diagnosis not present

## 2022-06-07 DIAGNOSIS — Z4589 Encounter for adjustment and management of other implanted devices: Secondary | ICD-10-CM | POA: Diagnosis not present

## 2022-06-27 ENCOUNTER — Ambulatory Visit (HOSPITAL_BASED_OUTPATIENT_CLINIC_OR_DEPARTMENT_OTHER)
Admission: RE | Admit: 2022-06-27 | Discharge: 2022-06-27 | Disposition: A | Discharge status: Home / Self Care | Payer: Medicare Other | Source: Ambulatory Visit | Attending: Internal Medicine | Admitting: Internal Medicine

## 2022-06-27 DIAGNOSIS — R634 Abnormal weight loss: Secondary | ICD-10-CM | POA: Insufficient documentation

## 2022-06-27 DIAGNOSIS — R75 Inconclusive laboratory evidence of human immunodeficiency virus [HIV]: Secondary | ICD-10-CM | POA: Diagnosis not present

## 2022-06-27 DIAGNOSIS — R071 Chest pain on breathing: Secondary | ICD-10-CM | POA: Insufficient documentation

## 2022-06-27 DIAGNOSIS — R5382 Chronic fatigue, unspecified: Secondary | ICD-10-CM | POA: Insufficient documentation

## 2022-06-27 LAB — POCT I-STAT CREATININE: Creatinine, Ser: 1.1 mg/dL (ref 0.61–1.24)

## 2022-06-27 MED ORDER — IOHEXOL 300 MG/ML  SOLN
100.0000 mL | Freq: Once | INTRAMUSCULAR | Status: AC | PRN
  Administered 2022-06-27: 100 mL via INTRAVENOUS

## 2022-07-11 ENCOUNTER — Telehealth: Payer: Self-pay | Admitting: Medical Oncology

## 2022-07-11 NOTE — Telephone Encounter (Signed)
"   The problem I am having is in my rectal area - there is something there, I am having a problem having a BM. Can Dr Julien Nordmann order an MRI or scan for that area"?  I told pt that he  needs to contact his PCP practice with this new problem and needs to be examined.   Per Dr. Julien Nordmann, I instructed pt to contact his PCP practice.

## 2022-07-15 DIAGNOSIS — R194 Change in bowel habit: Secondary | ICD-10-CM | POA: Diagnosis not present

## 2022-07-15 DIAGNOSIS — I4891 Unspecified atrial fibrillation: Secondary | ICD-10-CM | POA: Diagnosis not present

## 2022-07-15 DIAGNOSIS — R634 Abnormal weight loss: Secondary | ICD-10-CM | POA: Diagnosis not present

## 2022-07-17 DIAGNOSIS — L821 Other seborrheic keratosis: Secondary | ICD-10-CM | POA: Diagnosis not present

## 2022-07-17 DIAGNOSIS — L570 Actinic keratosis: Secondary | ICD-10-CM | POA: Diagnosis not present

## 2022-07-17 DIAGNOSIS — D692 Other nonthrombocytopenic purpura: Secondary | ICD-10-CM | POA: Diagnosis not present

## 2022-07-24 DIAGNOSIS — Z23 Encounter for immunization: Secondary | ICD-10-CM | POA: Diagnosis not present

## 2022-07-24 DIAGNOSIS — E7800 Pure hypercholesterolemia, unspecified: Secondary | ICD-10-CM | POA: Diagnosis not present

## 2022-07-24 DIAGNOSIS — Z0000 Encounter for general adult medical examination without abnormal findings: Secondary | ICD-10-CM | POA: Diagnosis not present

## 2022-07-24 DIAGNOSIS — K4090 Unilateral inguinal hernia, without obstruction or gangrene, not specified as recurrent: Secondary | ICD-10-CM | POA: Diagnosis not present

## 2022-07-24 DIAGNOSIS — R634 Abnormal weight loss: Secondary | ICD-10-CM | POA: Diagnosis not present

## 2022-07-24 DIAGNOSIS — K219 Gastro-esophageal reflux disease without esophagitis: Secondary | ICD-10-CM | POA: Diagnosis not present

## 2022-07-24 DIAGNOSIS — I10 Essential (primary) hypertension: Secondary | ICD-10-CM | POA: Diagnosis not present

## 2022-07-24 DIAGNOSIS — F339 Major depressive disorder, recurrent, unspecified: Secondary | ICD-10-CM | POA: Diagnosis not present

## 2022-07-24 DIAGNOSIS — R911 Solitary pulmonary nodule: Secondary | ICD-10-CM | POA: Diagnosis not present

## 2022-07-24 DIAGNOSIS — R7303 Prediabetes: Secondary | ICD-10-CM | POA: Diagnosis not present

## 2022-07-24 DIAGNOSIS — I350 Nonrheumatic aortic (valve) stenosis: Secondary | ICD-10-CM | POA: Diagnosis not present

## 2022-07-24 DIAGNOSIS — D649 Anemia, unspecified: Secondary | ICD-10-CM | POA: Diagnosis not present

## 2022-07-25 DIAGNOSIS — R7303 Prediabetes: Secondary | ICD-10-CM | POA: Diagnosis not present

## 2022-07-25 DIAGNOSIS — E7800 Pure hypercholesterolemia, unspecified: Secondary | ICD-10-CM | POA: Diagnosis not present

## 2022-07-25 DIAGNOSIS — I10 Essential (primary) hypertension: Secondary | ICD-10-CM | POA: Diagnosis not present

## 2022-07-25 DIAGNOSIS — D649 Anemia, unspecified: Secondary | ICD-10-CM | POA: Diagnosis not present

## 2022-07-25 DIAGNOSIS — R634 Abnormal weight loss: Secondary | ICD-10-CM | POA: Diagnosis not present

## 2022-07-29 DIAGNOSIS — K4090 Unilateral inguinal hernia, without obstruction or gangrene, not specified as recurrent: Secondary | ICD-10-CM | POA: Diagnosis not present

## 2022-07-31 DIAGNOSIS — M4316 Spondylolisthesis, lumbar region: Secondary | ICD-10-CM | POA: Diagnosis not present

## 2022-07-31 DIAGNOSIS — M48061 Spinal stenosis, lumbar region without neurogenic claudication: Secondary | ICD-10-CM | POA: Diagnosis not present

## 2022-08-05 DIAGNOSIS — K4090 Unilateral inguinal hernia, without obstruction or gangrene, not specified as recurrent: Secondary | ICD-10-CM | POA: Diagnosis not present

## 2022-08-08 DIAGNOSIS — R1031 Right lower quadrant pain: Secondary | ICD-10-CM | POA: Diagnosis not present

## 2022-08-08 DIAGNOSIS — M545 Low back pain, unspecified: Secondary | ICD-10-CM | POA: Diagnosis not present

## 2022-08-08 DIAGNOSIS — G8929 Other chronic pain: Secondary | ICD-10-CM | POA: Diagnosis not present

## 2022-08-08 DIAGNOSIS — K4090 Unilateral inguinal hernia, without obstruction or gangrene, not specified as recurrent: Secondary | ICD-10-CM | POA: Diagnosis not present

## 2022-08-09 ENCOUNTER — Other Ambulatory Visit: Payer: Self-pay | Admitting: Neurological Surgery

## 2022-08-15 DIAGNOSIS — M5416 Radiculopathy, lumbar region: Secondary | ICD-10-CM | POA: Diagnosis not present

## 2022-08-21 DIAGNOSIS — M5416 Radiculopathy, lumbar region: Secondary | ICD-10-CM | POA: Diagnosis not present

## 2022-08-29 ENCOUNTER — Other Ambulatory Visit (HOSPITAL_COMMUNITY): Payer: Self-pay | Admitting: Neurological Surgery

## 2022-08-29 ENCOUNTER — Other Ambulatory Visit: Payer: Self-pay | Admitting: Neurological Surgery

## 2022-08-29 DIAGNOSIS — M5416 Radiculopathy, lumbar region: Secondary | ICD-10-CM

## 2022-09-04 DIAGNOSIS — Z87891 Personal history of nicotine dependence: Secondary | ICD-10-CM | POA: Diagnosis not present

## 2022-09-04 DIAGNOSIS — E785 Hyperlipidemia, unspecified: Secondary | ICD-10-CM | POA: Diagnosis not present

## 2022-09-04 DIAGNOSIS — I350 Nonrheumatic aortic (valve) stenosis: Secondary | ICD-10-CM | POA: Diagnosis not present

## 2022-09-04 DIAGNOSIS — K4090 Unilateral inguinal hernia, without obstruction or gangrene, not specified as recurrent: Secondary | ICD-10-CM | POA: Diagnosis not present

## 2022-09-04 DIAGNOSIS — Z01810 Encounter for preprocedural cardiovascular examination: Secondary | ICD-10-CM | POA: Diagnosis not present

## 2022-09-04 DIAGNOSIS — I491 Atrial premature depolarization: Secondary | ICD-10-CM | POA: Diagnosis not present

## 2022-09-04 DIAGNOSIS — I480 Paroxysmal atrial fibrillation: Secondary | ICD-10-CM | POA: Diagnosis not present

## 2022-09-04 DIAGNOSIS — F418 Other specified anxiety disorders: Secondary | ICD-10-CM | POA: Diagnosis not present

## 2022-09-04 DIAGNOSIS — I4891 Unspecified atrial fibrillation: Secondary | ICD-10-CM | POA: Diagnosis not present

## 2022-09-11 ENCOUNTER — Ambulatory Visit (HOSPITAL_COMMUNITY)
Admission: RE | Admit: 2022-09-11 | Discharge: 2022-09-11 | Disposition: A | Discharge status: Home / Self Care | Payer: Medicare Other | Source: Ambulatory Visit | Attending: Neurological Surgery | Admitting: Neurological Surgery

## 2022-09-11 ENCOUNTER — Other Ambulatory Visit: Payer: Self-pay

## 2022-09-11 DIAGNOSIS — Z981 Arthrodesis status: Secondary | ICD-10-CM | POA: Diagnosis not present

## 2022-09-11 DIAGNOSIS — M5416 Radiculopathy, lumbar region: Secondary | ICD-10-CM

## 2022-09-11 DIAGNOSIS — M5126 Other intervertebral disc displacement, lumbar region: Secondary | ICD-10-CM | POA: Diagnosis not present

## 2022-09-11 DIAGNOSIS — M545 Low back pain, unspecified: Secondary | ICD-10-CM | POA: Diagnosis not present

## 2022-09-11 MED ORDER — ONDANSETRON HCL 4 MG/2ML IJ SOLN: 4.0000 mg | Freq: Four times a day (QID) | INTRAMUSCULAR | Status: DC | PRN

## 2022-09-11 MED ORDER — DIAZEPAM 5 MG PO TABS: 10.0000 mg | ORAL_TABLET | Freq: Once | ORAL | Status: AC

## 2022-09-11 MED ORDER — DIAZEPAM 5 MG PO TABS
ORAL_TABLET | ORAL | Status: AC
  Administered 2022-09-11: 10 mg via ORAL
  Filled 2022-09-11: qty 2

## 2022-09-11 MED ORDER — LIDOCAINE HCL (PF) 1 % IJ SOLN
5.0000 mL | Freq: Once | INTRAMUSCULAR | Status: AC
  Administered 2022-09-11: 1 mL via INTRADERMAL

## 2022-09-11 MED ORDER — IOHEXOL 180 MG/ML  SOLN
20.0000 mL | Freq: Once | INTRAMUSCULAR | Status: AC | PRN
  Administered 2022-09-11: 12 mL via INTRATHECAL

## 2022-09-11 MED ORDER — OXYCODONE-ACETAMINOPHEN 5-325 MG PO TABS: 1.0000 | ORAL_TABLET | ORAL | Status: DC | PRN

## 2022-09-11 NOTE — Progress Notes (Signed)
Pt and wife received d/c instructions, both written and verbal. Denies any further questions and concerns. Denies any acute pain or discomfort. Dressing remains in place no s/s of drainage or bleeding noted. Pt in stable condition

## 2022-09-11 NOTE — Procedures (Signed)
Cody Mcconnell is a 77 year old male whose had previous surgery to decompress L3-4 he subsequently had an anterolateral decompression at L2-L3 and then he developed a spondylolisthesis at L5-S1 with degenerative changes at L4-L5.  His earlier surgeries were done by Dr. Erline Levine upon his retirement I saw Cody Mcconnell and noted that he did have the degenerative processes at L4-5 and L5-S1.  He is also had some issues in the region of the right hip he has had a number of procedures on the right hip but he has pain in the inguinal region on that right side.  He has been concerned that this is related to his lumbar spine.  He notes also that he gets centralized low back pain at the lumbosacral junction that has been severe and unrelenting.  A myelogram is now being performed to better evaluate the lumbar spine as there is significant hardware in the lower lumbar spine and MRI imaging has not been helpful in diagnosing overt nerve root compromise or any other abnormality.  Pre op Dx: Lumbar radiculopathy Post op Dx: Same Procedure: Lumbar myelogram Surgeon: Chauntay Paszkiewicz Puncture level: L2-3 Fluid color: Clear colorless Injection: Iohexol 180, 12 mL Findings: No evidence of overt nerve root cut off.  Solid arthrodesis L2 to sacrum.  Further evaluation with CT scanning.

## 2022-09-13 DIAGNOSIS — L570 Actinic keratosis: Secondary | ICD-10-CM | POA: Diagnosis not present

## 2022-09-13 DIAGNOSIS — M5416 Radiculopathy, lumbar region: Secondary | ICD-10-CM | POA: Diagnosis not present

## 2022-09-17 DIAGNOSIS — I491 Atrial premature depolarization: Secondary | ICD-10-CM | POA: Diagnosis not present

## 2022-09-17 DIAGNOSIS — Z79899 Other long term (current) drug therapy: Secondary | ICD-10-CM | POA: Diagnosis not present

## 2022-09-17 DIAGNOSIS — K4091 Unilateral inguinal hernia, without obstruction or gangrene, recurrent: Secondary | ICD-10-CM | POA: Diagnosis not present

## 2022-09-17 DIAGNOSIS — M47816 Spondylosis without myelopathy or radiculopathy, lumbar region: Secondary | ICD-10-CM | POA: Diagnosis not present

## 2022-09-17 DIAGNOSIS — H8103 Meniere's disease, bilateral: Secondary | ICD-10-CM | POA: Diagnosis not present

## 2022-09-17 DIAGNOSIS — G4700 Insomnia, unspecified: Secondary | ICD-10-CM | POA: Diagnosis not present

## 2022-09-17 DIAGNOSIS — G8929 Other chronic pain: Secondary | ICD-10-CM | POA: Diagnosis not present

## 2022-09-17 DIAGNOSIS — E785 Hyperlipidemia, unspecified: Secondary | ICD-10-CM | POA: Diagnosis not present

## 2022-09-17 DIAGNOSIS — M5030 Other cervical disc degeneration, unspecified cervical region: Secondary | ICD-10-CM | POA: Diagnosis not present

## 2022-09-17 DIAGNOSIS — F32A Depression, unspecified: Secondary | ICD-10-CM | POA: Diagnosis not present

## 2022-09-17 DIAGNOSIS — G8918 Other acute postprocedural pain: Secondary | ICD-10-CM | POA: Diagnosis not present

## 2022-09-17 DIAGNOSIS — K4090 Unilateral inguinal hernia, without obstruction or gangrene, not specified as recurrent: Secondary | ICD-10-CM | POA: Diagnosis not present

## 2022-09-17 DIAGNOSIS — M5136 Other intervertebral disc degeneration, lumbar region: Secondary | ICD-10-CM | POA: Diagnosis not present

## 2022-09-17 DIAGNOSIS — I350 Nonrheumatic aortic (valve) stenosis: Secondary | ICD-10-CM | POA: Diagnosis not present

## 2022-09-17 DIAGNOSIS — Z981 Arthrodesis status: Secondary | ICD-10-CM | POA: Diagnosis not present

## 2022-09-17 DIAGNOSIS — Z87891 Personal history of nicotine dependence: Secondary | ICD-10-CM | POA: Diagnosis not present

## 2022-09-17 DIAGNOSIS — Z7960 Long term (current) use of unspecified immunomodulators and immunosuppressants: Secondary | ICD-10-CM | POA: Diagnosis not present

## 2022-09-17 DIAGNOSIS — F419 Anxiety disorder, unspecified: Secondary | ICD-10-CM | POA: Diagnosis not present

## 2022-09-17 DIAGNOSIS — I480 Paroxysmal atrial fibrillation: Secondary | ICD-10-CM | POA: Diagnosis not present

## 2022-09-17 DIAGNOSIS — R7303 Prediabetes: Secondary | ICD-10-CM | POA: Diagnosis not present

## 2022-09-17 DIAGNOSIS — H905 Unspecified sensorineural hearing loss: Secondary | ICD-10-CM | POA: Diagnosis not present

## 2022-09-17 DIAGNOSIS — N529 Male erectile dysfunction, unspecified: Secondary | ICD-10-CM | POA: Diagnosis not present

## 2022-10-01 DIAGNOSIS — R0989 Other specified symptoms and signs involving the circulatory and respiratory systems: Secondary | ICD-10-CM | POA: Diagnosis not present

## 2022-10-01 DIAGNOSIS — R051 Acute cough: Secondary | ICD-10-CM | POA: Diagnosis not present

## 2022-10-02 DIAGNOSIS — M461 Sacroiliitis, not elsewhere classified: Secondary | ICD-10-CM | POA: Diagnosis not present

## 2022-10-02 DIAGNOSIS — M5416 Radiculopathy, lumbar region: Secondary | ICD-10-CM | POA: Diagnosis not present

## 2022-10-10 DIAGNOSIS — Z48815 Encounter for surgical aftercare following surgery on the digestive system: Secondary | ICD-10-CM | POA: Diagnosis not present

## 2022-10-23 DIAGNOSIS — M5414 Radiculopathy, thoracic region: Secondary | ICD-10-CM | POA: Diagnosis not present

## 2022-10-31 DIAGNOSIS — R972 Elevated prostate specific antigen [PSA]: Secondary | ICD-10-CM | POA: Diagnosis not present

## 2022-10-31 DIAGNOSIS — N138 Other obstructive and reflux uropathy: Secondary | ICD-10-CM | POA: Diagnosis not present

## 2022-10-31 DIAGNOSIS — N401 Enlarged prostate with lower urinary tract symptoms: Secondary | ICD-10-CM | POA: Diagnosis not present

## 2022-11-08 DIAGNOSIS — M5414 Radiculopathy, thoracic region: Secondary | ICD-10-CM | POA: Diagnosis not present

## 2022-11-12 ENCOUNTER — Other Ambulatory Visit (HOSPITAL_COMMUNITY): Payer: Self-pay

## 2022-11-12 DIAGNOSIS — M5431 Sciatica, right side: Secondary | ICD-10-CM | POA: Diagnosis not present

## 2022-11-12 DIAGNOSIS — R972 Elevated prostate specific antigen [PSA]: Secondary | ICD-10-CM | POA: Diagnosis not present

## 2022-11-12 DIAGNOSIS — M5134 Other intervertebral disc degeneration, thoracic region: Secondary | ICD-10-CM | POA: Diagnosis not present

## 2022-11-12 DIAGNOSIS — M9902 Segmental and somatic dysfunction of thoracic region: Secondary | ICD-10-CM | POA: Diagnosis not present

## 2022-11-12 DIAGNOSIS — M9904 Segmental and somatic dysfunction of sacral region: Secondary | ICD-10-CM | POA: Diagnosis not present

## 2022-11-12 DIAGNOSIS — M9903 Segmental and somatic dysfunction of lumbar region: Secondary | ICD-10-CM | POA: Diagnosis not present

## 2022-11-12 DIAGNOSIS — R911 Solitary pulmonary nodule: Secondary | ICD-10-CM

## 2022-11-14 ENCOUNTER — Telehealth: Payer: Self-pay

## 2022-11-14 DIAGNOSIS — Z85828 Personal history of other malignant neoplasm of skin: Secondary | ICD-10-CM | POA: Diagnosis not present

## 2022-11-14 DIAGNOSIS — M9902 Segmental and somatic dysfunction of thoracic region: Secondary | ICD-10-CM | POA: Diagnosis not present

## 2022-11-14 DIAGNOSIS — L578 Other skin changes due to chronic exposure to nonionizing radiation: Secondary | ICD-10-CM | POA: Diagnosis not present

## 2022-11-14 DIAGNOSIS — L570 Actinic keratosis: Secondary | ICD-10-CM | POA: Diagnosis not present

## 2022-11-14 DIAGNOSIS — D1801 Hemangioma of skin and subcutaneous tissue: Secondary | ICD-10-CM | POA: Diagnosis not present

## 2022-11-14 DIAGNOSIS — M9903 Segmental and somatic dysfunction of lumbar region: Secondary | ICD-10-CM | POA: Diagnosis not present

## 2022-11-14 DIAGNOSIS — M9904 Segmental and somatic dysfunction of sacral region: Secondary | ICD-10-CM | POA: Diagnosis not present

## 2022-11-14 DIAGNOSIS — M5431 Sciatica, right side: Secondary | ICD-10-CM | POA: Diagnosis not present

## 2022-11-14 DIAGNOSIS — Z86018 Personal history of other benign neoplasm: Secondary | ICD-10-CM | POA: Diagnosis not present

## 2022-11-14 DIAGNOSIS — D229 Melanocytic nevi, unspecified: Secondary | ICD-10-CM | POA: Diagnosis not present

## 2022-11-14 DIAGNOSIS — L821 Other seborrheic keratosis: Secondary | ICD-10-CM | POA: Diagnosis not present

## 2022-11-14 DIAGNOSIS — M5134 Other intervertebral disc degeneration, thoracic region: Secondary | ICD-10-CM | POA: Diagnosis not present

## 2022-11-14 DIAGNOSIS — L814 Other melanin hyperpigmentation: Secondary | ICD-10-CM | POA: Diagnosis not present

## 2022-11-14 NOTE — Patient Instructions (Signed)
Visit Information  Thank you for taking time to visit with me today. Please don't hesitate to contact me if I can be of assistance to you.   Following are the goals we discussed today:   Goals Addressed             This Visit's Progress    COMPLETED: Care Coordination Activities-No follow up Required       Care Coordination Interventions: Advised patient to Annual Wellness exam. Discussed [facility] services and support. Assessed SDOH. Advised to discuss with primary care physician if services needed in the future.  Interventions Today    Flowsheet Row Most Recent Value  General Interventions   General Interventions Discussed/Reviewed General Interventions Discussed, Doctor Visits  Doctor Visits Discussed/Reviewed Doctor Visits Discussed, PCP  Education Interventions   Education Provided Provided Education  Provided Verbal Education On Other  [CT Scan follow up]               If you are experiencing a Mental Health or [place] or need someone to talk to, please call the Suicide and Crisis Lifeline: 988   Patient verbalizes understanding of instructions and care plan provided today and agrees to view in [place]. Active MyChart status and patient understanding of how to access instructions and care plan via MyChart confirmed with patient.     No further follow up required: decline  Jone Baseman, RN, MSN [facility] Care Management Coordinator Direct Line [phone]

## 2022-11-14 NOTE — Patient Outreach (Signed)
  Care Coordination   Initial Visit Note   11/14/2022 Name: FENG DEVICH MRN: [id number] DOB: 27-Apr-1946  [name] is a 77 y.o. year old male who sees Jani Gravel, MD for primary care. I spoke with  [name] by phone today.  What matters to the patients health and wellness today?  none    Goals Addressed             This Visit's Progress    COMPLETED: Care Coordination Activities-No follow up Required       Care Coordination Interventions: Advised patient to Annual Wellness exam. Discussed [facility] services and support. Assessed SDOH. Advised to discuss with primary care physician if services needed in the future.  Interventions Today    Flowsheet Row Most Recent Value  General Interventions   General Interventions Discussed/Reviewed General Interventions Discussed, Doctor Visits  Doctor Visits Discussed/Reviewed Doctor Visits Discussed, PCP  Education Interventions   Education Provided Provided Education  Provided Verbal Education On Other  [CT Scan follow up]             SDOH assessments and interventions completed:  Yes  SDOH Interventions Today    Flowsheet Row Most Recent Value  SDOH Interventions   Housing Interventions Intervention Not Indicated  Transportation Interventions Intervention Not Indicated        Care Coordination Interventions:  Yes, provided   Follow up plan: No further intervention required.   Encounter Outcome:  Pt. Visit Completed   Jone Baseman, RN, MSN [place] [facility] Care Management Coordinator Direct Line [phone]

## 2022-11-20 DIAGNOSIS — M5431 Sciatica, right side: Secondary | ICD-10-CM | POA: Diagnosis not present

## 2022-11-20 DIAGNOSIS — M5134 Other intervertebral disc degeneration, thoracic region: Secondary | ICD-10-CM | POA: Diagnosis not present

## 2022-11-20 DIAGNOSIS — M9902 Segmental and somatic dysfunction of thoracic region: Secondary | ICD-10-CM | POA: Diagnosis not present

## 2022-11-20 DIAGNOSIS — M9904 Segmental and somatic dysfunction of sacral region: Secondary | ICD-10-CM | POA: Diagnosis not present

## 2022-11-20 DIAGNOSIS — M9903 Segmental and somatic dysfunction of lumbar region: Secondary | ICD-10-CM | POA: Diagnosis not present

## 2022-11-21 DIAGNOSIS — Z4889 Encounter for other specified surgical aftercare: Secondary | ICD-10-CM | POA: Diagnosis not present

## 2022-11-21 DIAGNOSIS — Z8719 Personal history of other diseases of the digestive system: Secondary | ICD-10-CM | POA: Diagnosis not present

## 2022-11-21 DIAGNOSIS — M5134 Other intervertebral disc degeneration, thoracic region: Secondary | ICD-10-CM | POA: Diagnosis not present

## 2022-11-21 DIAGNOSIS — M9904 Segmental and somatic dysfunction of sacral region: Secondary | ICD-10-CM | POA: Diagnosis not present

## 2022-11-21 DIAGNOSIS — M9902 Segmental and somatic dysfunction of thoracic region: Secondary | ICD-10-CM | POA: Diagnosis not present

## 2022-11-21 DIAGNOSIS — M5431 Sciatica, right side: Secondary | ICD-10-CM | POA: Diagnosis not present

## 2022-11-21 DIAGNOSIS — M9903 Segmental and somatic dysfunction of lumbar region: Secondary | ICD-10-CM | POA: Diagnosis not present

## 2022-11-21 DIAGNOSIS — Z9889 Other specified postprocedural states: Secondary | ICD-10-CM | POA: Diagnosis not present

## 2022-11-25 DIAGNOSIS — M9903 Segmental and somatic dysfunction of lumbar region: Secondary | ICD-10-CM | POA: Diagnosis not present

## 2022-11-25 DIAGNOSIS — M9902 Segmental and somatic dysfunction of thoracic region: Secondary | ICD-10-CM | POA: Diagnosis not present

## 2022-11-25 DIAGNOSIS — M5134 Other intervertebral disc degeneration, thoracic region: Secondary | ICD-10-CM | POA: Diagnosis not present

## 2022-11-25 DIAGNOSIS — M5431 Sciatica, right side: Secondary | ICD-10-CM | POA: Diagnosis not present

## 2022-11-25 DIAGNOSIS — M9904 Segmental and somatic dysfunction of sacral region: Secondary | ICD-10-CM | POA: Diagnosis not present

## 2022-11-26 ENCOUNTER — Ambulatory Visit (HOSPITAL_BASED_OUTPATIENT_CLINIC_OR_DEPARTMENT_OTHER)
Admission: RE | Admit: 2022-11-26 | Discharge: 2022-11-26 | Disposition: A | Discharge status: Home / Self Care | Payer: Medicare Other | Source: Ambulatory Visit

## 2022-11-26 DIAGNOSIS — R972 Elevated prostate specific antigen [PSA]: Secondary | ICD-10-CM | POA: Diagnosis not present

## 2022-11-26 DIAGNOSIS — M9904 Segmental and somatic dysfunction of sacral region: Secondary | ICD-10-CM | POA: Diagnosis not present

## 2022-11-26 DIAGNOSIS — R911 Solitary pulmonary nodule: Secondary | ICD-10-CM

## 2022-11-26 DIAGNOSIS — R918 Other nonspecific abnormal finding of lung field: Secondary | ICD-10-CM | POA: Diagnosis not present

## 2022-11-26 DIAGNOSIS — M5431 Sciatica, right side: Secondary | ICD-10-CM | POA: Diagnosis not present

## 2022-11-26 DIAGNOSIS — M9902 Segmental and somatic dysfunction of thoracic region: Secondary | ICD-10-CM | POA: Diagnosis not present

## 2022-11-26 DIAGNOSIS — M5134 Other intervertebral disc degeneration, thoracic region: Secondary | ICD-10-CM | POA: Diagnosis not present

## 2022-11-26 DIAGNOSIS — M9903 Segmental and somatic dysfunction of lumbar region: Secondary | ICD-10-CM | POA: Diagnosis not present

## 2022-11-28 DIAGNOSIS — M9903 Segmental and somatic dysfunction of lumbar region: Secondary | ICD-10-CM | POA: Diagnosis not present

## 2022-11-28 DIAGNOSIS — M9904 Segmental and somatic dysfunction of sacral region: Secondary | ICD-10-CM | POA: Diagnosis not present

## 2022-11-28 DIAGNOSIS — M9902 Segmental and somatic dysfunction of thoracic region: Secondary | ICD-10-CM | POA: Diagnosis not present

## 2022-11-28 DIAGNOSIS — M5431 Sciatica, right side: Secondary | ICD-10-CM | POA: Diagnosis not present

## 2022-11-28 DIAGNOSIS — M5134 Other intervertebral disc degeneration, thoracic region: Secondary | ICD-10-CM | POA: Diagnosis not present

## 2022-12-02 DIAGNOSIS — M9904 Segmental and somatic dysfunction of sacral region: Secondary | ICD-10-CM | POA: Diagnosis not present

## 2022-12-02 DIAGNOSIS — M5431 Sciatica, right side: Secondary | ICD-10-CM | POA: Diagnosis not present

## 2022-12-02 DIAGNOSIS — M9903 Segmental and somatic dysfunction of lumbar region: Secondary | ICD-10-CM | POA: Diagnosis not present

## 2022-12-02 DIAGNOSIS — M9902 Segmental and somatic dysfunction of thoracic region: Secondary | ICD-10-CM | POA: Diagnosis not present

## 2022-12-02 DIAGNOSIS — M5134 Other intervertebral disc degeneration, thoracic region: Secondary | ICD-10-CM | POA: Diagnosis not present

## 2022-12-03 DIAGNOSIS — M9902 Segmental and somatic dysfunction of thoracic region: Secondary | ICD-10-CM | POA: Diagnosis not present

## 2022-12-03 DIAGNOSIS — M5134 Other intervertebral disc degeneration, thoracic region: Secondary | ICD-10-CM | POA: Diagnosis not present

## 2022-12-03 DIAGNOSIS — M5431 Sciatica, right side: Secondary | ICD-10-CM | POA: Diagnosis not present

## 2022-12-03 DIAGNOSIS — M9903 Segmental and somatic dysfunction of lumbar region: Secondary | ICD-10-CM | POA: Diagnosis not present

## 2022-12-03 DIAGNOSIS — M9904 Segmental and somatic dysfunction of sacral region: Secondary | ICD-10-CM | POA: Diagnosis not present

## 2022-12-04 DIAGNOSIS — H04522 Eversion of left lacrimal punctum: Secondary | ICD-10-CM | POA: Diagnosis not present

## 2022-12-04 DIAGNOSIS — Z01818 Encounter for other preprocedural examination: Secondary | ICD-10-CM | POA: Diagnosis not present

## 2022-12-04 DIAGNOSIS — H04521 Eversion of right lacrimal punctum: Secondary | ICD-10-CM | POA: Diagnosis not present

## 2022-12-04 DIAGNOSIS — H04123 Dry eye syndrome of bilateral lacrimal glands: Secondary | ICD-10-CM | POA: Diagnosis not present

## 2022-12-04 DIAGNOSIS — H0279 Other degenerative disorders of eyelid and periocular area: Secondary | ICD-10-CM | POA: Diagnosis not present

## 2022-12-04 DIAGNOSIS — H16211 Exposure keratoconjunctivitis, right eye: Secondary | ICD-10-CM | POA: Diagnosis not present

## 2022-12-04 DIAGNOSIS — H16213 Exposure keratoconjunctivitis, bilateral: Secondary | ICD-10-CM | POA: Diagnosis not present

## 2022-12-04 DIAGNOSIS — H02135 Senile ectropion of left lower eyelid: Secondary | ICD-10-CM | POA: Diagnosis not present

## 2022-12-04 DIAGNOSIS — H16212 Exposure keratoconjunctivitis, left eye: Secondary | ICD-10-CM | POA: Diagnosis not present

## 2022-12-04 DIAGNOSIS — H04223 Epiphora due to insufficient drainage, bilateral lacrimal glands: Secondary | ICD-10-CM | POA: Diagnosis not present

## 2022-12-04 DIAGNOSIS — H04563 Stenosis of bilateral lacrimal punctum: Secondary | ICD-10-CM | POA: Diagnosis not present

## 2022-12-04 DIAGNOSIS — H02132 Senile ectropion of right lower eyelid: Secondary | ICD-10-CM | POA: Diagnosis not present

## 2022-12-05 DIAGNOSIS — M9902 Segmental and somatic dysfunction of thoracic region: Secondary | ICD-10-CM | POA: Diagnosis not present

## 2022-12-05 DIAGNOSIS — M9903 Segmental and somatic dysfunction of lumbar region: Secondary | ICD-10-CM | POA: Diagnosis not present

## 2022-12-05 DIAGNOSIS — M9904 Segmental and somatic dysfunction of sacral region: Secondary | ICD-10-CM | POA: Diagnosis not present

## 2022-12-05 DIAGNOSIS — M5134 Other intervertebral disc degeneration, thoracic region: Secondary | ICD-10-CM | POA: Diagnosis not present

## 2022-12-05 DIAGNOSIS — M5431 Sciatica, right side: Secondary | ICD-10-CM | POA: Diagnosis not present

## 2022-12-13 DIAGNOSIS — H9313 Tinnitus, bilateral: Secondary | ICD-10-CM | POA: Diagnosis not present

## 2022-12-13 DIAGNOSIS — H8103 Meniere's disease, bilateral: Secondary | ICD-10-CM | POA: Diagnosis not present

## 2022-12-13 DIAGNOSIS — H90A21 Sensorineural hearing loss, unilateral, right ear, with restricted hearing on the contralateral side: Secondary | ICD-10-CM | POA: Diagnosis not present

## 2022-12-13 DIAGNOSIS — Z4589 Encounter for adjustment and management of other implanted devices: Secondary | ICD-10-CM | POA: Diagnosis not present

## 2022-12-13 DIAGNOSIS — H903 Sensorineural hearing loss, bilateral: Secondary | ICD-10-CM | POA: Diagnosis not present

## 2022-12-13 DIAGNOSIS — H6992 Unspecified Eustachian tube disorder, left ear: Secondary | ICD-10-CM | POA: Diagnosis not present

## 2022-12-16 DIAGNOSIS — M9904 Segmental and somatic dysfunction of sacral region: Secondary | ICD-10-CM | POA: Diagnosis not present

## 2022-12-16 DIAGNOSIS — M5431 Sciatica, right side: Secondary | ICD-10-CM | POA: Diagnosis not present

## 2022-12-16 DIAGNOSIS — Z461 Encounter for fitting and adjustment of hearing aid: Secondary | ICD-10-CM | POA: Diagnosis not present

## 2022-12-16 DIAGNOSIS — M9902 Segmental and somatic dysfunction of thoracic region: Secondary | ICD-10-CM | POA: Diagnosis not present

## 2022-12-16 DIAGNOSIS — H90A22 Sensorineural hearing loss, unilateral, left ear, with restricted hearing on the contralateral side: Secondary | ICD-10-CM | POA: Diagnosis not present

## 2022-12-16 DIAGNOSIS — M5134 Other intervertebral disc degeneration, thoracic region: Secondary | ICD-10-CM | POA: Diagnosis not present

## 2022-12-16 DIAGNOSIS — M9903 Segmental and somatic dysfunction of lumbar region: Secondary | ICD-10-CM | POA: Diagnosis not present

## 2022-12-17 DIAGNOSIS — M9904 Segmental and somatic dysfunction of sacral region: Secondary | ICD-10-CM | POA: Diagnosis not present

## 2022-12-17 DIAGNOSIS — M5134 Other intervertebral disc degeneration, thoracic region: Secondary | ICD-10-CM | POA: Diagnosis not present

## 2022-12-17 DIAGNOSIS — M9903 Segmental and somatic dysfunction of lumbar region: Secondary | ICD-10-CM | POA: Diagnosis not present

## 2022-12-17 DIAGNOSIS — M5431 Sciatica, right side: Secondary | ICD-10-CM | POA: Diagnosis not present

## 2022-12-17 DIAGNOSIS — M9902 Segmental and somatic dysfunction of thoracic region: Secondary | ICD-10-CM | POA: Diagnosis not present

## 2022-12-18 DIAGNOSIS — M5134 Other intervertebral disc degeneration, thoracic region: Secondary | ICD-10-CM | POA: Diagnosis not present

## 2022-12-18 DIAGNOSIS — M5431 Sciatica, right side: Secondary | ICD-10-CM | POA: Diagnosis not present

## 2022-12-18 DIAGNOSIS — M9903 Segmental and somatic dysfunction of lumbar region: Secondary | ICD-10-CM | POA: Diagnosis not present

## 2022-12-18 DIAGNOSIS — M9904 Segmental and somatic dysfunction of sacral region: Secondary | ICD-10-CM | POA: Diagnosis not present

## 2022-12-18 DIAGNOSIS — M9902 Segmental and somatic dysfunction of thoracic region: Secondary | ICD-10-CM | POA: Diagnosis not present

## 2022-12-23 DIAGNOSIS — M9902 Segmental and somatic dysfunction of thoracic region: Secondary | ICD-10-CM | POA: Diagnosis not present

## 2022-12-23 DIAGNOSIS — M9903 Segmental and somatic dysfunction of lumbar region: Secondary | ICD-10-CM | POA: Diagnosis not present

## 2022-12-23 DIAGNOSIS — M9904 Segmental and somatic dysfunction of sacral region: Secondary | ICD-10-CM | POA: Diagnosis not present

## 2022-12-23 DIAGNOSIS — M5431 Sciatica, right side: Secondary | ICD-10-CM | POA: Diagnosis not present

## 2022-12-23 DIAGNOSIS — M5134 Other intervertebral disc degeneration, thoracic region: Secondary | ICD-10-CM | POA: Diagnosis not present

## 2022-12-24 DIAGNOSIS — M5134 Other intervertebral disc degeneration, thoracic region: Secondary | ICD-10-CM | POA: Diagnosis not present

## 2022-12-24 DIAGNOSIS — M9904 Segmental and somatic dysfunction of sacral region: Secondary | ICD-10-CM | POA: Diagnosis not present

## 2022-12-24 DIAGNOSIS — M9903 Segmental and somatic dysfunction of lumbar region: Secondary | ICD-10-CM | POA: Diagnosis not present

## 2022-12-24 DIAGNOSIS — M5431 Sciatica, right side: Secondary | ICD-10-CM | POA: Diagnosis not present

## 2022-12-24 DIAGNOSIS — M9902 Segmental and somatic dysfunction of thoracic region: Secondary | ICD-10-CM | POA: Diagnosis not present

## 2022-12-25 DIAGNOSIS — M5431 Sciatica, right side: Secondary | ICD-10-CM | POA: Diagnosis not present

## 2022-12-25 DIAGNOSIS — M5134 Other intervertebral disc degeneration, thoracic region: Secondary | ICD-10-CM | POA: Diagnosis not present

## 2022-12-25 DIAGNOSIS — M9903 Segmental and somatic dysfunction of lumbar region: Secondary | ICD-10-CM | POA: Diagnosis not present

## 2022-12-25 DIAGNOSIS — M9902 Segmental and somatic dysfunction of thoracic region: Secondary | ICD-10-CM | POA: Diagnosis not present

## 2022-12-25 DIAGNOSIS — M9904 Segmental and somatic dysfunction of sacral region: Secondary | ICD-10-CM | POA: Diagnosis not present

## 2022-12-30 DIAGNOSIS — M9904 Segmental and somatic dysfunction of sacral region: Secondary | ICD-10-CM | POA: Diagnosis not present

## 2022-12-30 DIAGNOSIS — M5134 Other intervertebral disc degeneration, thoracic region: Secondary | ICD-10-CM | POA: Diagnosis not present

## 2022-12-30 DIAGNOSIS — M5431 Sciatica, right side: Secondary | ICD-10-CM | POA: Diagnosis not present

## 2022-12-30 DIAGNOSIS — M9902 Segmental and somatic dysfunction of thoracic region: Secondary | ICD-10-CM | POA: Diagnosis not present

## 2022-12-30 DIAGNOSIS — M9903 Segmental and somatic dysfunction of lumbar region: Secondary | ICD-10-CM | POA: Diagnosis not present

## 2023-01-01 DIAGNOSIS — M9903 Segmental and somatic dysfunction of lumbar region: Secondary | ICD-10-CM | POA: Diagnosis not present

## 2023-01-01 DIAGNOSIS — M9902 Segmental and somatic dysfunction of thoracic region: Secondary | ICD-10-CM | POA: Diagnosis not present

## 2023-01-01 DIAGNOSIS — M4316 Spondylolisthesis, lumbar region: Secondary | ICD-10-CM | POA: Diagnosis not present

## 2023-01-01 DIAGNOSIS — M9904 Segmental and somatic dysfunction of sacral region: Secondary | ICD-10-CM | POA: Diagnosis not present

## 2023-01-01 DIAGNOSIS — M5414 Radiculopathy, thoracic region: Secondary | ICD-10-CM | POA: Diagnosis not present

## 2023-01-01 DIAGNOSIS — M25551 Pain in right hip: Secondary | ICD-10-CM | POA: Diagnosis not present

## 2023-01-01 DIAGNOSIS — M5431 Sciatica, right side: Secondary | ICD-10-CM | POA: Diagnosis not present

## 2023-01-01 DIAGNOSIS — M5416 Radiculopathy, lumbar region: Secondary | ICD-10-CM | POA: Diagnosis not present

## 2023-01-01 DIAGNOSIS — M5134 Other intervertebral disc degeneration, thoracic region: Secondary | ICD-10-CM | POA: Diagnosis not present

## 2023-01-02 DIAGNOSIS — M5134 Other intervertebral disc degeneration, thoracic region: Secondary | ICD-10-CM | POA: Diagnosis not present

## 2023-01-02 DIAGNOSIS — M9903 Segmental and somatic dysfunction of lumbar region: Secondary | ICD-10-CM | POA: Diagnosis not present

## 2023-01-02 DIAGNOSIS — M5431 Sciatica, right side: Secondary | ICD-10-CM | POA: Diagnosis not present

## 2023-01-02 DIAGNOSIS — M9902 Segmental and somatic dysfunction of thoracic region: Secondary | ICD-10-CM | POA: Diagnosis not present

## 2023-01-02 DIAGNOSIS — M9904 Segmental and somatic dysfunction of sacral region: Secondary | ICD-10-CM | POA: Diagnosis not present

## 2023-01-06 DIAGNOSIS — M9902 Segmental and somatic dysfunction of thoracic region: Secondary | ICD-10-CM | POA: Diagnosis not present

## 2023-01-06 DIAGNOSIS — M9903 Segmental and somatic dysfunction of lumbar region: Secondary | ICD-10-CM | POA: Diagnosis not present

## 2023-01-06 DIAGNOSIS — M5134 Other intervertebral disc degeneration, thoracic region: Secondary | ICD-10-CM | POA: Diagnosis not present

## 2023-01-06 DIAGNOSIS — M9904 Segmental and somatic dysfunction of sacral region: Secondary | ICD-10-CM | POA: Diagnosis not present

## 2023-01-06 DIAGNOSIS — M5431 Sciatica, right side: Secondary | ICD-10-CM | POA: Diagnosis not present

## 2023-01-07 DIAGNOSIS — M9902 Segmental and somatic dysfunction of thoracic region: Secondary | ICD-10-CM | POA: Diagnosis not present

## 2023-01-07 DIAGNOSIS — F339 Major depressive disorder, recurrent, unspecified: Secondary | ICD-10-CM | POA: Diagnosis not present

## 2023-01-07 DIAGNOSIS — F5101 Primary insomnia: Secondary | ICD-10-CM | POA: Diagnosis not present

## 2023-01-07 DIAGNOSIS — M9903 Segmental and somatic dysfunction of lumbar region: Secondary | ICD-10-CM | POA: Diagnosis not present

## 2023-01-07 DIAGNOSIS — M169 Osteoarthritis of hip, unspecified: Secondary | ICD-10-CM | POA: Diagnosis not present

## 2023-01-07 DIAGNOSIS — M9904 Segmental and somatic dysfunction of sacral region: Secondary | ICD-10-CM | POA: Diagnosis not present

## 2023-01-07 DIAGNOSIS — M5134 Other intervertebral disc degeneration, thoracic region: Secondary | ICD-10-CM | POA: Diagnosis not present

## 2023-01-07 DIAGNOSIS — M5431 Sciatica, right side: Secondary | ICD-10-CM | POA: Diagnosis not present

## 2023-01-07 DIAGNOSIS — E7800 Pure hypercholesterolemia, unspecified: Secondary | ICD-10-CM | POA: Diagnosis not present

## 2023-01-08 DIAGNOSIS — M9903 Segmental and somatic dysfunction of lumbar region: Secondary | ICD-10-CM | POA: Diagnosis not present

## 2023-01-08 DIAGNOSIS — M9904 Segmental and somatic dysfunction of sacral region: Secondary | ICD-10-CM | POA: Diagnosis not present

## 2023-01-08 DIAGNOSIS — M9902 Segmental and somatic dysfunction of thoracic region: Secondary | ICD-10-CM | POA: Diagnosis not present

## 2023-01-08 DIAGNOSIS — M5134 Other intervertebral disc degeneration, thoracic region: Secondary | ICD-10-CM | POA: Diagnosis not present

## 2023-01-08 DIAGNOSIS — M5431 Sciatica, right side: Secondary | ICD-10-CM | POA: Diagnosis not present

## 2023-01-13 DIAGNOSIS — M5134 Other intervertebral disc degeneration, thoracic region: Secondary | ICD-10-CM | POA: Diagnosis not present

## 2023-01-13 DIAGNOSIS — M9904 Segmental and somatic dysfunction of sacral region: Secondary | ICD-10-CM | POA: Diagnosis not present

## 2023-01-13 DIAGNOSIS — M9902 Segmental and somatic dysfunction of thoracic region: Secondary | ICD-10-CM | POA: Diagnosis not present

## 2023-01-13 DIAGNOSIS — M5431 Sciatica, right side: Secondary | ICD-10-CM | POA: Diagnosis not present

## 2023-01-13 DIAGNOSIS — M9903 Segmental and somatic dysfunction of lumbar region: Secondary | ICD-10-CM | POA: Diagnosis not present

## 2023-01-14 DIAGNOSIS — M5134 Other intervertebral disc degeneration, thoracic region: Secondary | ICD-10-CM | POA: Diagnosis not present

## 2023-01-14 DIAGNOSIS — M9902 Segmental and somatic dysfunction of thoracic region: Secondary | ICD-10-CM | POA: Diagnosis not present

## 2023-01-14 DIAGNOSIS — M5431 Sciatica, right side: Secondary | ICD-10-CM | POA: Diagnosis not present

## 2023-01-14 DIAGNOSIS — M9904 Segmental and somatic dysfunction of sacral region: Secondary | ICD-10-CM | POA: Diagnosis not present

## 2023-01-14 DIAGNOSIS — M9903 Segmental and somatic dysfunction of lumbar region: Secondary | ICD-10-CM | POA: Diagnosis not present

## 2023-01-16 DIAGNOSIS — M9904 Segmental and somatic dysfunction of sacral region: Secondary | ICD-10-CM | POA: Diagnosis not present

## 2023-01-16 DIAGNOSIS — M9902 Segmental and somatic dysfunction of thoracic region: Secondary | ICD-10-CM | POA: Diagnosis not present

## 2023-01-16 DIAGNOSIS — M5431 Sciatica, right side: Secondary | ICD-10-CM | POA: Diagnosis not present

## 2023-01-16 DIAGNOSIS — M5134 Other intervertebral disc degeneration, thoracic region: Secondary | ICD-10-CM | POA: Diagnosis not present

## 2023-01-16 DIAGNOSIS — M9903 Segmental and somatic dysfunction of lumbar region: Secondary | ICD-10-CM | POA: Diagnosis not present

## 2023-01-17 DIAGNOSIS — H16213 Exposure keratoconjunctivitis, bilateral: Secondary | ICD-10-CM | POA: Diagnosis not present

## 2023-01-17 DIAGNOSIS — H04223 Epiphora due to insufficient drainage, bilateral lacrimal glands: Secondary | ICD-10-CM | POA: Diagnosis not present

## 2023-01-17 DIAGNOSIS — H04522 Eversion of left lacrimal punctum: Secondary | ICD-10-CM | POA: Diagnosis not present

## 2023-01-17 DIAGNOSIS — H02135 Senile ectropion of left lower eyelid: Secondary | ICD-10-CM | POA: Diagnosis not present

## 2023-01-17 DIAGNOSIS — H04563 Stenosis of bilateral lacrimal punctum: Secondary | ICD-10-CM | POA: Diagnosis not present

## 2023-01-17 DIAGNOSIS — H0232 Blepharochalasis right lower eyelid: Secondary | ICD-10-CM | POA: Diagnosis not present

## 2023-01-17 DIAGNOSIS — H04521 Eversion of right lacrimal punctum: Secondary | ICD-10-CM | POA: Diagnosis not present

## 2023-01-17 DIAGNOSIS — H0235 Blepharochalasis left lower eyelid: Secondary | ICD-10-CM | POA: Diagnosis not present

## 2023-01-17 DIAGNOSIS — H0279 Other degenerative disorders of eyelid and periocular area: Secondary | ICD-10-CM | POA: Diagnosis not present

## 2023-01-17 DIAGNOSIS — H04523 Eversion of bilateral lacrimal punctum: Secondary | ICD-10-CM | POA: Diagnosis not present

## 2023-01-17 DIAGNOSIS — L988 Other specified disorders of the skin and subcutaneous tissue: Secondary | ICD-10-CM | POA: Diagnosis not present

## 2023-01-17 DIAGNOSIS — H02132 Senile ectropion of right lower eyelid: Secondary | ICD-10-CM | POA: Diagnosis not present

## 2023-01-21 DIAGNOSIS — E7800 Pure hypercholesterolemia, unspecified: Secondary | ICD-10-CM | POA: Diagnosis not present

## 2023-01-21 DIAGNOSIS — F5101 Primary insomnia: Secondary | ICD-10-CM | POA: Diagnosis not present

## 2023-01-21 DIAGNOSIS — D649 Anemia, unspecified: Secondary | ICD-10-CM | POA: Diagnosis not present

## 2023-01-21 DIAGNOSIS — R7303 Prediabetes: Secondary | ICD-10-CM | POA: Diagnosis not present

## 2023-01-21 DIAGNOSIS — R634 Abnormal weight loss: Secondary | ICD-10-CM | POA: Diagnosis not present

## 2023-01-22 DIAGNOSIS — M9904 Segmental and somatic dysfunction of sacral region: Secondary | ICD-10-CM | POA: Diagnosis not present

## 2023-01-22 DIAGNOSIS — M9902 Segmental and somatic dysfunction of thoracic region: Secondary | ICD-10-CM | POA: Diagnosis not present

## 2023-01-22 DIAGNOSIS — M5134 Other intervertebral disc degeneration, thoracic region: Secondary | ICD-10-CM | POA: Diagnosis not present

## 2023-01-22 DIAGNOSIS — M9903 Segmental and somatic dysfunction of lumbar region: Secondary | ICD-10-CM | POA: Diagnosis not present

## 2023-01-22 DIAGNOSIS — M5431 Sciatica, right side: Secondary | ICD-10-CM | POA: Diagnosis not present

## 2023-01-23 DIAGNOSIS — M9904 Segmental and somatic dysfunction of sacral region: Secondary | ICD-10-CM | POA: Diagnosis not present

## 2023-01-23 DIAGNOSIS — M9902 Segmental and somatic dysfunction of thoracic region: Secondary | ICD-10-CM | POA: Diagnosis not present

## 2023-01-23 DIAGNOSIS — M5134 Other intervertebral disc degeneration, thoracic region: Secondary | ICD-10-CM | POA: Diagnosis not present

## 2023-01-23 DIAGNOSIS — M9903 Segmental and somatic dysfunction of lumbar region: Secondary | ICD-10-CM | POA: Diagnosis not present

## 2023-01-23 DIAGNOSIS — M5431 Sciatica, right side: Secondary | ICD-10-CM | POA: Diagnosis not present

## 2023-01-27 DIAGNOSIS — M9902 Segmental and somatic dysfunction of thoracic region: Secondary | ICD-10-CM | POA: Diagnosis not present

## 2023-01-27 DIAGNOSIS — M9904 Segmental and somatic dysfunction of sacral region: Secondary | ICD-10-CM | POA: Diagnosis not present

## 2023-01-27 DIAGNOSIS — M5134 Other intervertebral disc degeneration, thoracic region: Secondary | ICD-10-CM | POA: Diagnosis not present

## 2023-01-27 DIAGNOSIS — M5431 Sciatica, right side: Secondary | ICD-10-CM | POA: Diagnosis not present

## 2023-01-27 DIAGNOSIS — M9903 Segmental and somatic dysfunction of lumbar region: Secondary | ICD-10-CM | POA: Diagnosis not present

## 2023-01-30 DIAGNOSIS — M9902 Segmental and somatic dysfunction of thoracic region: Secondary | ICD-10-CM | POA: Diagnosis not present

## 2023-01-30 DIAGNOSIS — M9903 Segmental and somatic dysfunction of lumbar region: Secondary | ICD-10-CM | POA: Diagnosis not present

## 2023-01-30 DIAGNOSIS — M5431 Sciatica, right side: Secondary | ICD-10-CM | POA: Diagnosis not present

## 2023-01-30 DIAGNOSIS — M9904 Segmental and somatic dysfunction of sacral region: Secondary | ICD-10-CM | POA: Diagnosis not present

## 2023-01-30 DIAGNOSIS — M5134 Other intervertebral disc degeneration, thoracic region: Secondary | ICD-10-CM | POA: Diagnosis not present

## 2023-02-10 ENCOUNTER — Encounter: Payer: Self-pay | Admitting: Cardiovascular Disease

## 2023-02-10 ENCOUNTER — Ambulatory Visit (HOSPITAL_COMMUNITY): Payer: Medicare Other | Attending: Cardiology

## 2023-02-10 ENCOUNTER — Ambulatory Visit (INDEPENDENT_AMBULATORY_CARE_PROVIDER_SITE_OTHER): Payer: Medicare Other | Admitting: Cardiovascular Disease

## 2023-02-10 VITALS — BP 110/72 | HR 72 | Ht 69.0 in | Wt 172.8 lb

## 2023-02-10 DIAGNOSIS — I350 Nonrheumatic aortic (valve) stenosis: Secondary | ICD-10-CM | POA: Insufficient documentation

## 2023-02-10 DIAGNOSIS — I480 Paroxysmal atrial fibrillation: Secondary | ICD-10-CM | POA: Insufficient documentation

## 2023-02-10 LAB — ECHOCARDIOGRAM COMPLETE
AR max vel: 0.8 cm2
AV Area VTI: 0.82 cm2
AV Area mean vel: 0.75 cm2
AV Mean grad: 28 mmHg
AV Peak grad: 45.6 mmHg
Ao pk vel: 3.38 m/s
Area-P 1/2: 3.59 cm2
P 1/2 time: 320 msec
S' Lateral: 3.1 cm

## 2023-02-10 NOTE — Patient Instructions (Signed)
Medication Instructions:  Your physician recommends that you continue on your current medications as directed. Please refer to the Current Medication list given to you today.  *If you need a refill on your cardiac medications before your next appointment, please call your pharmacy*   Lab Work: NONE If you have labs (blood work) drawn today and your tests are completely normal, you will receive your results only by: MyChart Message (if you have MyChart) OR A paper copy in the mail If you have any lab test that is abnormal or we need to change your treatment, we will call you to review the results.   Testing/Procedures: ECHO Your physician has requested that you have an echocardiogram. Echocardiography is a painless test that uses sound waves to create images of your heart. It provides your doctor with information about the size and shape of your heart and how well your heart's chambers and valves are working. This procedure takes approximately one hour. There are no restrictions for this procedure. Please do NOT wear cologne, perfume, aftershave, or lotions (deodorant is allowed). Please arrive 15 minutes prior to your appointment time.  Follow-Up: At [facility], you and your health needs are our priority.  As part of our continuing mission to provide you with exceptional heart care, we have created designated Provider Care Teams.  These Care Teams include your primary Cardiologist (physician) and Advanced Practice Providers (APPs -  Physician Assistants and Nurse Practitioners) who all work together to provide you with the care you need, when you need it.  Your next appointment:   1 year(s)  Provider:   Michael Cooper, MD     

## 2023-02-10 NOTE — Progress Notes (Signed)
Cardiology Office Note:    Date:  02/10/2023   ID:  Cody Mcconnell, DOB 06/07/1946, MRN [id number]  PCP:  Linus Galas, NP   [facility] Providers Cardiologist:  Tonny Bollman, MD     Referring MD: Pearson Grippe, MD   Chief Complaint  Patient presents with   Aortic Stenosis    History of Present Illness:    Cody Mcconnell is a 77 y.o. male with a hx of paroxysmal atrial fibrillation and moderate aortic stenosis, presenting for follow-up evaluation.  Comorbid conditions include coronary artery calcification, hypertension, and mixed hyperlipidemia.  The patient has not had any episodes of atrial fibrillation over the past few years and he does not take any oral anticoagulant drugs.  He has had an aversion to taking these medications over time.   The patient is here alone today.  He just had an echocardiogram prior to the visit with the formal interpretation currently pending.  He has had a lot of problems with his back, hip, and had an inguinal hernia over recent years.  The patient has had several surgeries.  He has had issues with chronic pain.  From a cardiac perspective, he feels that he is doing okay.  He has had no symptoms of atrial fibrillation and specifically denies any heart palpitations.  He is able to swim at least a third of a mile for exercise with no exertional dyspnea or chest discomfort.  He denies lightheadedness or syncope.  Past Medical History:  Diagnosis Date   Anemia    SLIGHT ANEMIA 4 MONTHS AGO   Aortic valve stenosis 12/19/2017   Echo 07/30/16 - EF 55-60, mild aortic stenosis (mean 9) // Echo 4/19:  EF 60-65 mild AS (mean 11) // Echocardiogram 11/21: EF 60-65, no RWMA, mild LVH, Gr 1 DD, normal RVSF, trivial MR, calcified AV, mod AS (mean 20 mmHg, Vmax 280 cm/s, DI 0.27), trivial AI  // Echo 5/22: EF 60-65, moderate LVH, no RWMA, trivial MR, moderate AS (mean gradient 20 mmHg, V-max 287 cm/s, DI 0.35)    Arthritis    OA   Benign prostate  hyperplasia    unspecified whether lower urinary tract sym. present    Carotid atherosclerosis    Carotid US 4/19:  bilat ICA 1-39; vertebrals and subclavians normal   Carotid atherosclerosis, bilateral 04/30/2018   1-39% on 12/2017 Care Everywhere Echo   Coronary artery calcification seen on CT scan 06/05/2018   Dysrhythmia    ATRIAL FIB   Elevated prostate specific antigen (PSA) 08/30/2016   Glucose intolerance    "pre-diabetic"   Hayfever    Heart murmur    History of exercise stress test    GXT 4/19:  ETT with good exercise tolerance (10:00); no chest pain; normal BP response; no diagnostic ST changes; negative adequate ETT.   HOH (hard of hearing)    BOTH EARS   Low back pain with sciatica 06/02/2012   Lumbar herniated disc 01/30/2018   Mild aortic stenosis 04/30/2018   12/2017 Care Everywhere Echo : Mean gradient (S): 11 mm Hg. Peak gradient (S): 24 mm Hg.   Numbness of right thumb    FROM CERVICAL NECK SURGERY   Osteoarthritis of right hip 09/21/2018   Overweight (BMI 25.0-29.9) 09/30/2018   Paroxysmal atrial fibrillation (HCC) 06/05/2018   Pre-diabetes    Prediabetes 04/30/2018   not sure about this   Prostatitis    unspecified prostatitis type   Pure hypercholesterolemia     Past Surgical  History:  Procedure Laterality Date   ANTERIOR LAT LUMBAR FUSION Right 06/29/2020   Procedure: Right Lumbar Two-Three Anterolateral lumbar interbody fusion with lateral plate;  Surgeon: Maeola Harman, MD;  Location: [facility] OR;  Service: Neurosurgery;  Laterality: Right;  Right Lumbar Two-Three Anterolateral lumbar interbody fusion with lateral plate   CERVICAL SPINE SURGERY  2011   COLONOSCOPY     HIP SURGERY Right    arthroscopy   LUMBAR DISC SURGERY  2014   L2-3, L3 TO L4 X 2 2019 AT [facility]   LUMBAR FUSION  2019   psoas muscle repair (groin muscle?)  2022   SHOULDER ARTHROSCOPY Right    TOOTH EXTRACTION     TOTAL HIP ARTHROPLASTY Right 09/29/2018   Procedure: TOTAL HIP  ARTHROPLASTY ANTERIOR APPROACH;  Surgeon: Durene Romans, MD;  Location: [facility] ORS;  Service: Orthopedics;  Laterality: Right;  71    Current Medications: Current Meds  Medication Sig   acetaminophen (TYLENOL) 325 MG tablet Take 650 mg by mouth every 6 (six) hours as needed for moderate pain.   cetirizine (ZYRTEC) 10 MG tablet Take 10 mg by mouth daily.   diclofenac Sodium (VOLTAREN) 1 % GEL Apply 1 Application topically 2 (two) times daily as needed (joint pain).   fluticasone (FLONASE) 50 MCG/ACT nasal spray Place 1 spray into both nostrils daily as needed for allergies or rhinitis.   montelukast (SINGULAIR) 10 MG tablet Take 10 mg by mouth at bedtime.   naproxen sodium (ALEVE) 220 MG tablet Take 440 mg by mouth daily.   sildenafil (VIAGRA) 100 MG tablet Take 100 mg by mouth daily as needed for erectile dysfunction.   simvastatin (ZOCOR) 40 MG tablet Take 40 mg by mouth daily.   triamterene-hydrochlorothiazide (DYAZIDE) 37.5-25 MG capsule Take 1 capsule by mouth every morning.     Allergies:   Patient has no known allergies.   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 Patrica Duel (retired) from [place], [place]   Social Determinants of Health   Financial Resource Strain: Not on file  Food Insecurity: Not on file  Transportation Needs: No Transportation Needs (11/14/2022)   PRAPARE - Administrator, Civil Service (Medical): No    Lack of Transportation (Non-Medical): No  Physical Activity: Not on file  Stress: Not on file  Social Connections: Not on file     Family History: The patient's family history includes CAD in his  father; CAD (age of onset: 24) in his brother; Congestive Heart Failure in his father; Dementia in his mother.  ROS:   Please see the history of present illness.    All other systems reviewed and are negative.  EKGs/Labs/Other Studies Reviewed:    The following studies were reviewed today: Cardiac Studies & Procedures     STRESS TESTS  EXERCISE TOLERANCE TEST (ETT) 12/31/2017  Narrative  Blood pressure demonstrated a normal response to exercise.  ETT with good exercise tolerance (10:00); no chest pain; normal BP response; no diagnostic ST changes; negative adequate ETT.   ECHOCARDIOGRAM  ECHOCARDIOGRAM COMPLETE 02/10/2023  Narrative ECHOCARDIOGRAM REPORT    Patient Name:   Cody Mcconnell Date of Exam: 02/10/2023 Medical Rec #:  [id number]          Height:       68.5 in Accession #:    [id number]         Weight:       158.0 lb Date of Birth:  1946/04/18          BSA:          1.859 m Patient Age:    77 years           BP:           110/68 mmHg Patient Gender: M                  HR:           79 bpm. Exam Location:  [address]  Procedure: 2D Echo, Cardiac Doppler and Color Doppler  Indications:    I35.0 Aortic Stenosis  History:        Patient has prior history of Echocardiogram examinations, most recent 02/28/2022. Arrythmias:Paroxysmal Atrial Fibrillation, Signs/Symptoms:Murmur; Risk Factors:Hypertension, Dyslipidemia and Pre-diabtets.  Sonographer:    Sedonia Small Rodgers-Jones RDCS Referring Phys:  [id number] Alianny Toelle  IMPRESSIONS   1. Left ventricular ejection fraction, by estimation, is 55 to 60%. The left ventricle has normal function. The left ventricle has no regional wall motion abnormalities. Left ventricular diastolic parameters are indeterminate. 2. Right ventricular systolic function is normal. The right ventricular size is normal. 3. The mitral valve is normal in structure. No evidence of mitral valve regurgitation. No evidence of mitral stenosis. 4. The aortic valve is calcified. Aortic valve regurgitation is mild. Moderate to severe aortic valve stenosis. Aortic regurgitation PHT measures 320 msec. Aortic valve area, by VTI measures 0.82 cm. Aortic valve mean gradient measures 28.0 mmHg. Aortic valve Vmax measures 3.38 m/s, DI 0.29, Indexed AVA 0.44 cm2/m2, SVI 32. 5. The inferior vena cava is normal in size with greater than 50% respiratory variability, suggesting right atrial pressure of 3 mmHg.  FINDINGS Left Ventricle: Left ventricular ejection fraction, by estimation, is 55 to 60%. The left ventricle has normal function. The left ventricle has no regional wall motion abnormalities. The left ventricular internal cavity size was normal in size. There is no left ventricular hypertrophy. Left ventricular diastolic parameters are indeterminate.  Right Ventricle: The right ventricular size is normal. No increase in right ventricular wall thickness. Right ventricular systolic function is normal.  Left Atrium: Left atrial size was normal in size.  Right Atrium: Right atrial size was normal in size.  Pericardium: There is no evidence of pericardial effusion. Presence of epicardial fat layer.  Mitral Valve: The mitral valve is normal in structure. No evidence of mitral valve regurgitation. No evidence of mitral valve stenosis.  Tricuspid Valve: The tricuspid  valve is normal in structure. Tricuspid valve regurgitation is mild . No evidence of tricuspid stenosis.  Aortic  Valve: The aortic valve is calcified. Aortic valve regurgitation is mild. Aortic regurgitation PHT measures 320 msec. Moderate to severe aortic stenosis is present. Aortic valve mean gradient measures 28.0 mmHg. Aortic valve peak gradient measures 45.6 mmHg. Aortic valve area, by VTI measures 0.82 cm.  Pulmonic Valve: The pulmonic valve was normal in structure. Pulmonic valve regurgitation is not visualized. No evidence of pulmonic stenosis.  Aorta: The aortic root is normal in size and structure.  Venous: The inferior vena cava is normal in size with greater than 50% respiratory variability, suggesting right atrial pressure of 3 mmHg.  IAS/Shunts: No atrial level shunt detected by color flow Doppler.   LEFT VENTRICLE PLAX 2D LVIDd:         4.70 cm   Diastology LVIDs:         3.10 cm   LV e' medial:    7.88 cm/s LV PW:         1.00 cm   LV E/e' medial:  10.8 LV IVS:        1.00 cm   LV e' lateral:   7.02 cm/s LVOT diam:     1.90 cm   LV E/e' lateral: 12.2 LV SV:         59 LV SV Index:   32 LVOT Area:     2.84 cm   RIGHT VENTRICLE RV Basal diam:  3.50 cm RV S prime:     14.47 cm/s TAPSE (M-mode): 1.8 cm  LEFT ATRIUM             Index        RIGHT ATRIUM           Index LA diam:        4.50 cm 2.42 cm/m   RA Area:     13.10 cm LA Vol (A2C):   51.9 ml 27.92 ml/m  RA Volume:   34.40 ml  18.50 ml/m LA Vol (A4C):   42.7 ml 22.97 ml/m LA Biplane Vol: 48.8 ml 26.25 ml/m AORTIC VALVE AV Area (Vmax):    0.80 cm AV Area (Vmean):   0.75 cm AV Area (VTI):     0.82 cm AV Vmax:           337.80 cm/s AV Vmean:          251.600 cm/s AV VTI:            0.719 m AV Peak Grad:      45.6 mmHg AV Mean Grad:      28.0 mmHg LVOT Vmax:         95.90 cm/s LVOT Vmean:        66.200 cm/s LVOT VTI:          0.208 m LVOT/AV VTI ratio: 0.29 AI PHT:            320 msec  AORTA Ao Root diam: 3.30 cm Ao Asc diam:  3.30 cm  MITRAL VALVE MV Area (PHT): 3.59 cm     SHUNTS MV Decel Time:  212 msec     Systemic VTI:  0.21 m MV E velocity: 85.40 cm/s   Systemic Diam: 1.90 cm MV A velocity: 105.50 cm/s MV E/A ratio:  0.81  Kardie Tobb DO Electronically signed by Thomasene Ripple DO Signature Date/Time: 02/10/2023/11:21:50 AM    Final    MONITORS  CARDIAC EVENT MONITOR 07/07/2020  EKG:  EKG is ordered today.  The ekg ordered today demonstrates NSR 72 bpm, within normal limits  Recent Labs: 02/25/2022: ALT 18; BUN 19; Hemoglobin 14.4; Platelet Count 298; Potassium 4.3; Sodium 139 06/27/2022: Creatinine, Ser 1.10  Recent Lipid Panel No results found for: "CHOL", "TRIG", "HDL", "CHOLHDL", "VLDL", "LDLCALC", "LDLDIRECT"   Risk Assessment/Calculations:    CHA2DS2-VASc Score = 3   This indicates a 3.2% annual risk of stroke. The patient's score is based upon: CHF History: 0 HTN History: 1 Diabetes History: 0 Stroke History: 0 Vascular Disease History: 0 Age Score: 2 Gender Score: 0               Physical Exam:    VS:  BP 110/72   Pulse 72   Ht 5\' 9"  (1.753 m)   Wt 172 lb 12.8 oz (78.4 kg)   SpO2 99%   BMI 25.52 kg/m     Wt Readings from Last 3 Encounters:  02/10/23 172 lb 12.8 oz (78.4 kg)  09/11/22 158 lb (71.7 kg)  02/28/22 150 lb (68 kg)     GEN:  Well nourished, well developed in no acute distress HEENT: Normal NECK: No JVD; No carotid bruits LYMPHATICS: No lymphadenopathy CARDIAC: RRR, 2/6 crescendo decrescendo murmur at the right upper sternal border RESPIRATORY:  Clear to auscultation without rales, wheezing or rhonchi  ABDOMEN: Soft, non-tender, non-distended MUSCULOSKELETAL:  No edema; No deformity  SKIN: Warm and dry NEUROLOGIC:  Alert and oriented x 3 PSYCHIATRIC:  Normal affect   ASSESSMENT:    1. Nonrheumatic aortic valve stenosis   2. Paroxysmal atrial fibrillation (HCC)    PLAN:    In order of problems listed above:  The patient has moderate to severe aortic stenosis.  I personally reviewed his echo images  which demonstrate normal LV systolic function with preserved LVEF.  The peak transaortic velocity is 3.64 m/s with peak and mean gradients of 53 and 31 mmHg, respectively.  The dimensionless index is 0.28 with a calculated aortic valve area of 0.8 cm.  There is mild aortic insufficiency present.  There is no other significant valvular disease noted.  He remains clinically asymptomatic.  We reviewed the cardinal symptoms of aortic stenosis to include shortness of breath, chest discomfort, lightheadedness, or progressive fatigue.  He will return in 1 year for follow-up clinical evaluation and echocardiogram.  I suspect he will require aortic valve intervention in the next few years based on his rate of progression and the presence of moderate to severe aortic stenosis on his recent echo study. No symptoms of AF in many years.  Negative event monitor in 2021.  The patient has not been anticoagulated after shared decision-making conversations (see prior notes).  Continue aspirin 81 mg daily.     Medication Adjustments/Labs and Tests Ordered: Current medicines are reviewed at length with the patient today.  Concerns regarding medicines are outlined above.  Orders Placed This Encounter  Procedures   EKG 12-Lead   ECHOCARDIOGRAM COMPLETE   No orders of the defined types were placed in this encounter.   Patient Instructions  Medication Instructions:  Your physician recommends that you continue on your current medications as directed. Please refer to the Current Medication list given to you today.  *If you need a refill on your cardiac medications before your next appointment, please call your pharmacy*  Lab Work: NONE If you have labs (blood work) drawn today and your tests are completely normal, you will receive your results only by: MyChart  Message (if you have MyChart) OR A paper copy in the mail If you have any lab test that is abnormal or we need to change your treatment, we will call you to  review the results.  Testing/Procedures: ECHO Your physician has requested that you have an echocardiogram. Echocardiography is a painless test that uses sound waves to create images of your heart. It provides your doctor with information about the size and shape of your heart and how well your heart's chambers and valves are working. This procedure takes approximately one hour. There are no restrictions for this procedure. Please do NOT wear cologne, perfume, aftershave, or lotions (deodorant is allowed). Please arrive 15 minutes prior to your appointment time.  Follow-Up: At [facility], you and your health needs are our priority.  As part of our continuing mission to provide you with exceptional heart care, we have created designated Provider Care Teams.  These Care Teams include your primary Cardiologist (physician) and Advanced Practice Providers (APPs -  Physician Assistants and Nurse Practitioners) who all work together to provide you with the care you need, when you need it.  Your next appointment:   1 year(s)  Provider:   Tonny Bollman, MD        Signed, Tonny Bollman, MD  02/10/2023 12:50 PM    [facility]

## 2023-02-11 DIAGNOSIS — H25012 Cortical age-related cataract, left eye: Secondary | ICD-10-CM | POA: Diagnosis not present

## 2023-02-11 DIAGNOSIS — M5414 Radiculopathy, thoracic region: Secondary | ICD-10-CM | POA: Diagnosis not present

## 2023-02-11 DIAGNOSIS — H43812 Vitreous degeneration, left eye: Secondary | ICD-10-CM | POA: Diagnosis not present

## 2023-02-11 DIAGNOSIS — H25042 Posterior subcapsular polar age-related cataract, left eye: Secondary | ICD-10-CM | POA: Diagnosis not present

## 2023-02-11 DIAGNOSIS — L570 Actinic keratosis: Secondary | ICD-10-CM | POA: Diagnosis not present

## 2023-02-17 DIAGNOSIS — M9902 Segmental and somatic dysfunction of thoracic region: Secondary | ICD-10-CM | POA: Diagnosis not present

## 2023-02-17 DIAGNOSIS — M9903 Segmental and somatic dysfunction of lumbar region: Secondary | ICD-10-CM | POA: Diagnosis not present

## 2023-02-17 DIAGNOSIS — M9904 Segmental and somatic dysfunction of sacral region: Secondary | ICD-10-CM | POA: Diagnosis not present

## 2023-02-17 DIAGNOSIS — M5431 Sciatica, right side: Secondary | ICD-10-CM | POA: Diagnosis not present

## 2023-02-17 DIAGNOSIS — M5134 Other intervertebral disc degeneration, thoracic region: Secondary | ICD-10-CM | POA: Diagnosis not present

## 2023-02-18 DIAGNOSIS — R7303 Prediabetes: Secondary | ICD-10-CM | POA: Diagnosis not present

## 2023-02-18 DIAGNOSIS — R972 Elevated prostate specific antigen [PSA]: Secondary | ICD-10-CM | POA: Diagnosis not present

## 2023-02-18 DIAGNOSIS — K219 Gastro-esophageal reflux disease without esophagitis: Secondary | ICD-10-CM | POA: Diagnosis not present

## 2023-02-18 DIAGNOSIS — R911 Solitary pulmonary nodule: Secondary | ICD-10-CM | POA: Diagnosis not present

## 2023-02-18 DIAGNOSIS — F339 Major depressive disorder, recurrent, unspecified: Secondary | ICD-10-CM | POA: Diagnosis not present

## 2023-02-18 DIAGNOSIS — K4090 Unilateral inguinal hernia, without obstruction or gangrene, not specified as recurrent: Secondary | ICD-10-CM | POA: Diagnosis not present

## 2023-02-18 DIAGNOSIS — G8929 Other chronic pain: Secondary | ICD-10-CM | POA: Diagnosis not present

## 2023-02-18 DIAGNOSIS — I350 Nonrheumatic aortic (valve) stenosis: Secondary | ICD-10-CM | POA: Diagnosis not present

## 2023-02-18 DIAGNOSIS — E7800 Pure hypercholesterolemia, unspecified: Secondary | ICD-10-CM | POA: Diagnosis not present

## 2023-02-18 DIAGNOSIS — I10 Essential (primary) hypertension: Secondary | ICD-10-CM | POA: Diagnosis not present

## 2023-02-20 DIAGNOSIS — M9904 Segmental and somatic dysfunction of sacral region: Secondary | ICD-10-CM | POA: Diagnosis not present

## 2023-02-20 DIAGNOSIS — M5431 Sciatica, right side: Secondary | ICD-10-CM | POA: Diagnosis not present

## 2023-02-20 DIAGNOSIS — M5134 Other intervertebral disc degeneration, thoracic region: Secondary | ICD-10-CM | POA: Diagnosis not present

## 2023-02-20 DIAGNOSIS — M9902 Segmental and somatic dysfunction of thoracic region: Secondary | ICD-10-CM | POA: Diagnosis not present

## 2023-02-20 DIAGNOSIS — M9903 Segmental and somatic dysfunction of lumbar region: Secondary | ICD-10-CM | POA: Diagnosis not present

## 2023-02-21 DIAGNOSIS — M7918 Myalgia, other site: Secondary | ICD-10-CM | POA: Diagnosis not present

## 2023-02-26 DIAGNOSIS — M9903 Segmental and somatic dysfunction of lumbar region: Secondary | ICD-10-CM | POA: Diagnosis not present

## 2023-02-26 DIAGNOSIS — M9904 Segmental and somatic dysfunction of sacral region: Secondary | ICD-10-CM | POA: Diagnosis not present

## 2023-02-26 DIAGNOSIS — M9902 Segmental and somatic dysfunction of thoracic region: Secondary | ICD-10-CM | POA: Diagnosis not present

## 2023-02-26 DIAGNOSIS — M5431 Sciatica, right side: Secondary | ICD-10-CM | POA: Diagnosis not present

## 2023-02-26 DIAGNOSIS — M5134 Other intervertebral disc degeneration, thoracic region: Secondary | ICD-10-CM | POA: Diagnosis not present

## 2023-02-27 DIAGNOSIS — M5431 Sciatica, right side: Secondary | ICD-10-CM | POA: Diagnosis not present

## 2023-02-27 DIAGNOSIS — H8102 Meniere's disease, left ear: Secondary | ICD-10-CM | POA: Diagnosis not present

## 2023-02-27 DIAGNOSIS — Z461 Encounter for fitting and adjustment of hearing aid: Secondary | ICD-10-CM | POA: Diagnosis not present

## 2023-02-27 DIAGNOSIS — Z974 Presence of external hearing-aid: Secondary | ICD-10-CM | POA: Diagnosis not present

## 2023-02-27 DIAGNOSIS — M9903 Segmental and somatic dysfunction of lumbar region: Secondary | ICD-10-CM | POA: Diagnosis not present

## 2023-02-27 DIAGNOSIS — H903 Sensorineural hearing loss, bilateral: Secondary | ICD-10-CM | POA: Diagnosis not present

## 2023-02-27 DIAGNOSIS — M5134 Other intervertebral disc degeneration, thoracic region: Secondary | ICD-10-CM | POA: Diagnosis not present

## 2023-02-27 DIAGNOSIS — M9904 Segmental and somatic dysfunction of sacral region: Secondary | ICD-10-CM | POA: Diagnosis not present

## 2023-02-27 DIAGNOSIS — M9902 Segmental and somatic dysfunction of thoracic region: Secondary | ICD-10-CM | POA: Diagnosis not present

## 2023-03-17 DIAGNOSIS — R051 Acute cough: Secondary | ICD-10-CM | POA: Diagnosis not present

## 2023-03-17 DIAGNOSIS — Z20822 Contact with and (suspected) exposure to covid-19: Secondary | ICD-10-CM | POA: Diagnosis not present

## 2023-03-17 DIAGNOSIS — J029 Acute pharyngitis, unspecified: Secondary | ICD-10-CM | POA: Diagnosis not present

## 2023-03-18 DIAGNOSIS — M5134 Other intervertebral disc degeneration, thoracic region: Secondary | ICD-10-CM | POA: Diagnosis not present

## 2023-03-18 DIAGNOSIS — M5431 Sciatica, right side: Secondary | ICD-10-CM | POA: Diagnosis not present

## 2023-03-18 DIAGNOSIS — M9904 Segmental and somatic dysfunction of sacral region: Secondary | ICD-10-CM | POA: Diagnosis not present

## 2023-03-18 DIAGNOSIS — M9902 Segmental and somatic dysfunction of thoracic region: Secondary | ICD-10-CM | POA: Diagnosis not present

## 2023-03-18 DIAGNOSIS — M9903 Segmental and somatic dysfunction of lumbar region: Secondary | ICD-10-CM | POA: Diagnosis not present

## 2023-03-19 DIAGNOSIS — G5781 Other specified mononeuropathies of right lower limb: Secondary | ICD-10-CM | POA: Diagnosis not present

## 2023-03-19 DIAGNOSIS — M5431 Sciatica, right side: Secondary | ICD-10-CM | POA: Diagnosis not present

## 2023-03-19 DIAGNOSIS — Z6825 Body mass index (BMI) 25.0-25.9, adult: Secondary | ICD-10-CM | POA: Diagnosis not present

## 2023-03-19 DIAGNOSIS — M9902 Segmental and somatic dysfunction of thoracic region: Secondary | ICD-10-CM | POA: Diagnosis not present

## 2023-03-19 DIAGNOSIS — M9903 Segmental and somatic dysfunction of lumbar region: Secondary | ICD-10-CM | POA: Diagnosis not present

## 2023-03-19 DIAGNOSIS — M9904 Segmental and somatic dysfunction of sacral region: Secondary | ICD-10-CM | POA: Diagnosis not present

## 2023-03-19 DIAGNOSIS — M5134 Other intervertebral disc degeneration, thoracic region: Secondary | ICD-10-CM | POA: Diagnosis not present

## 2023-04-01 ENCOUNTER — Encounter (HOSPITAL_BASED_OUTPATIENT_CLINIC_OR_DEPARTMENT_OTHER): Payer: Self-pay

## 2023-04-01 ENCOUNTER — Emergency Department (HOSPITAL_BASED_OUTPATIENT_CLINIC_OR_DEPARTMENT_OTHER): Payer: Medicare Other | Admitting: Radiology

## 2023-04-01 ENCOUNTER — Observation Stay (HOSPITAL_COMMUNITY): Payer: Medicare Other

## 2023-04-01 ENCOUNTER — Other Ambulatory Visit: Payer: Self-pay

## 2023-04-01 ENCOUNTER — Emergency Department (HOSPITAL_BASED_OUTPATIENT_CLINIC_OR_DEPARTMENT_OTHER): Payer: Medicare Other

## 2023-04-01 ENCOUNTER — Inpatient Hospital Stay (HOSPITAL_BASED_OUTPATIENT_CLINIC_OR_DEPARTMENT_OTHER)
Admission: EM | Admit: 2023-04-01 | Discharge: 2023-04-04 | DRG: 602 | Disposition: A | Discharge status: Home / Self Care | Payer: Medicare Other | Attending: Internal Medicine | Admitting: Internal Medicine

## 2023-04-01 DIAGNOSIS — E785 Hyperlipidemia, unspecified: Secondary | ICD-10-CM | POA: Diagnosis present

## 2023-04-01 DIAGNOSIS — I350 Nonrheumatic aortic (valve) stenosis: Secondary | ICD-10-CM | POA: Diagnosis not present

## 2023-04-01 DIAGNOSIS — R2242 Localized swelling, mass and lump, left lower limb: Secondary | ICD-10-CM | POA: Diagnosis not present

## 2023-04-01 DIAGNOSIS — Z79899 Other long term (current) drug therapy: Secondary | ICD-10-CM | POA: Diagnosis not present

## 2023-04-01 DIAGNOSIS — E782 Mixed hyperlipidemia: Secondary | ICD-10-CM | POA: Diagnosis not present

## 2023-04-01 DIAGNOSIS — M069 Rheumatoid arthritis, unspecified: Secondary | ICD-10-CM | POA: Diagnosis present

## 2023-04-01 DIAGNOSIS — Z981 Arthrodesis status: Secondary | ICD-10-CM

## 2023-04-01 DIAGNOSIS — Z96641 Presence of right artificial hip joint: Secondary | ICD-10-CM | POA: Diagnosis present

## 2023-04-01 DIAGNOSIS — Z8673 Personal history of transient ischemic attack (TIA), and cerebral infarction without residual deficits: Secondary | ICD-10-CM | POA: Diagnosis not present

## 2023-04-01 DIAGNOSIS — I10 Essential (primary) hypertension: Secondary | ICD-10-CM | POA: Diagnosis not present

## 2023-04-01 DIAGNOSIS — I2489 Other forms of acute ischemic heart disease: Secondary | ICD-10-CM | POA: Diagnosis not present

## 2023-04-01 DIAGNOSIS — I2510 Atherosclerotic heart disease of native coronary artery without angina pectoris: Secondary | ICD-10-CM | POA: Diagnosis present

## 2023-04-01 DIAGNOSIS — Z820 Family history of epilepsy and other diseases of the nervous system: Secondary | ICD-10-CM

## 2023-04-01 DIAGNOSIS — I4891 Unspecified atrial fibrillation: Principal | ICD-10-CM

## 2023-04-01 DIAGNOSIS — J40 Bronchitis, not specified as acute or chronic: Secondary | ICD-10-CM | POA: Diagnosis present

## 2023-04-01 DIAGNOSIS — I214 Non-ST elevation (NSTEMI) myocardial infarction: Secondary | ICD-10-CM | POA: Diagnosis not present

## 2023-04-01 DIAGNOSIS — H8109 Meniere's disease, unspecified ear: Secondary | ICD-10-CM | POA: Diagnosis not present

## 2023-04-01 DIAGNOSIS — T380X5A Adverse effect of glucocorticoids and synthetic analogues, initial encounter: Secondary | ICD-10-CM | POA: Diagnosis present

## 2023-04-01 DIAGNOSIS — N400 Enlarged prostate without lower urinary tract symptoms: Secondary | ICD-10-CM | POA: Diagnosis present

## 2023-04-01 DIAGNOSIS — N179 Acute kidney failure, unspecified: Secondary | ICD-10-CM | POA: Diagnosis not present

## 2023-04-01 DIAGNOSIS — E876 Hypokalemia: Secondary | ICD-10-CM | POA: Diagnosis not present

## 2023-04-01 DIAGNOSIS — Z1152 Encounter for screening for COVID-19: Secondary | ICD-10-CM

## 2023-04-01 DIAGNOSIS — E663 Overweight: Secondary | ICD-10-CM | POA: Diagnosis not present

## 2023-04-01 DIAGNOSIS — M7989 Other specified soft tissue disorders: Secondary | ICD-10-CM | POA: Diagnosis not present

## 2023-04-01 DIAGNOSIS — Z6825 Body mass index (BMI) 25.0-25.9, adult: Secondary | ICD-10-CM | POA: Diagnosis not present

## 2023-04-01 DIAGNOSIS — R002 Palpitations: Secondary | ICD-10-CM | POA: Diagnosis not present

## 2023-04-01 DIAGNOSIS — I340 Nonrheumatic mitral (valve) insufficiency: Secondary | ICD-10-CM | POA: Diagnosis not present

## 2023-04-01 DIAGNOSIS — R5383 Other fatigue: Secondary | ICD-10-CM | POA: Diagnosis not present

## 2023-04-01 DIAGNOSIS — R238 Other skin changes: Secondary | ICD-10-CM | POA: Diagnosis present

## 2023-04-01 DIAGNOSIS — R0689 Other abnormalities of breathing: Secondary | ICD-10-CM | POA: Diagnosis not present

## 2023-04-01 DIAGNOSIS — Z8249 Family history of ischemic heart disease and other diseases of the circulatory system: Secondary | ICD-10-CM

## 2023-04-01 DIAGNOSIS — I480 Paroxysmal atrial fibrillation: Secondary | ICD-10-CM | POA: Diagnosis present

## 2023-04-01 DIAGNOSIS — L03116 Cellulitis of left lower limb: Principal | ICD-10-CM | POA: Diagnosis present

## 2023-04-01 DIAGNOSIS — M1712 Unilateral primary osteoarthritis, left knee: Secondary | ICD-10-CM | POA: Diagnosis not present

## 2023-04-01 DIAGNOSIS — L0390 Cellulitis, unspecified: Secondary | ICD-10-CM | POA: Diagnosis present

## 2023-04-01 DIAGNOSIS — I21A1 Myocardial infarction type 2: Secondary | ICD-10-CM | POA: Diagnosis present

## 2023-04-01 DIAGNOSIS — Z87891 Personal history of nicotine dependence: Secondary | ICD-10-CM

## 2023-04-01 DIAGNOSIS — R7303 Prediabetes: Secondary | ICD-10-CM | POA: Diagnosis present

## 2023-04-01 DIAGNOSIS — M2550 Pain in unspecified joint: Secondary | ICD-10-CM | POA: Diagnosis not present

## 2023-04-01 DIAGNOSIS — R0609 Other forms of dyspnea: Secondary | ICD-10-CM | POA: Diagnosis not present

## 2023-04-01 LAB — CBC WITH DIFFERENTIAL/PLATELET
Abs Immature Granulocytes: 0.09 10*3/uL — ABNORMAL HIGH (ref 0.00–0.07)
Basophils Absolute: 0 10*3/uL (ref 0.0–0.1)
Basophils Relative: 0 %
Eosinophils Absolute: 0 10*3/uL (ref 0.0–0.5)
Eosinophils Relative: 0 %
HCT: 42.8 % (ref 39.0–52.0)
Hemoglobin: 14.2 g/dL (ref 13.0–17.0)
Immature Granulocytes: 1 %
Lymphocytes Relative: 10 %
Lymphs Abs: 1.1 10*3/uL (ref 0.7–4.0)
MCH: 27.7 pg (ref 26.0–34.0)
MCHC: 33.2 g/dL (ref 30.0–36.0)
MCV: 83.6 fL (ref 80.0–100.0)
Monocytes Absolute: 0.5 10*3/uL (ref 0.1–1.0)
Monocytes Relative: 5 %
Neutro Abs: 9 10*3/uL — ABNORMAL HIGH (ref 1.7–7.7)
Neutrophils Relative %: 84 %
Platelets: 189 10*3/uL (ref 150–400)
RBC: 5.12 MIL/uL (ref 4.22–5.81)
RDW: 13.4 % (ref 11.5–15.5)
WBC: 10.8 10*3/uL — ABNORMAL HIGH (ref 4.0–10.5)
nRBC: 0 % (ref 0.0–0.2)

## 2023-04-01 LAB — BASIC METABOLIC PANEL
Anion gap: 13 (ref 5–15)
BUN: 21 mg/dL (ref 8–23)
CO2: 24 mmol/L (ref 22–32)
Calcium: 8.4 mg/dL — ABNORMAL LOW (ref 8.9–10.3)
Chloride: 96 mmol/L — ABNORMAL LOW (ref 98–111)
Creatinine, Ser: 1.53 mg/dL — ABNORMAL HIGH (ref 0.61–1.24)
GFR, Estimated: 47 mL/min — ABNORMAL LOW (ref >60)
Glucose, Bld: 146 mg/dL — ABNORMAL HIGH (ref 70–99)
Potassium: 3.9 mmol/L (ref 3.5–5.1)
Sodium: 133 mmol/L — ABNORMAL LOW (ref 135–145)

## 2023-04-01 LAB — COMPREHENSIVE METABOLIC PANEL
ALT: 19 U/L (ref 0–44)
AST: 37 U/L (ref 15–41)
Albumin: 3.9 g/dL (ref 3.5–5.0)
Alkaline Phosphatase: 61 U/L (ref 38–126)
Anion gap: 14 (ref 5–15)
BUN: 21 mg/dL (ref 8–23)
CO2: 22 mmol/L (ref 22–32)
Calcium: 9 mg/dL (ref 8.9–10.3)
Chloride: 96 mmol/L — ABNORMAL LOW (ref 98–111)
Creatinine, Ser: 1.46 mg/dL — ABNORMAL HIGH (ref 0.61–1.24)
GFR, Estimated: 50 mL/min — ABNORMAL LOW (ref >60)
Glucose, Bld: 146 mg/dL — ABNORMAL HIGH (ref 70–99)
Potassium: 3.2 mmol/L — ABNORMAL LOW (ref 3.5–5.1)
Sodium: 132 mmol/L — ABNORMAL LOW (ref 135–145)
Total Bilirubin: 1.2 mg/dL (ref 0.3–1.2)
Total Protein: 7.4 g/dL (ref 6.5–8.1)

## 2023-04-01 LAB — URINALYSIS, ROUTINE W REFLEX MICROSCOPIC
Bilirubin Urine: NEGATIVE
Glucose, UA: NEGATIVE mg/dL
Ketones, ur: NEGATIVE mg/dL
Leukocytes,Ua: NEGATIVE
Nitrite: NEGATIVE
Protein, ur: NEGATIVE mg/dL
Specific Gravity, Urine: 1.01 (ref 1.005–1.030)
pH: 6 (ref 5.0–8.0)

## 2023-04-01 LAB — TSH: TSH: 2.767 u[IU]/mL (ref 0.350–4.500)

## 2023-04-01 LAB — URINALYSIS, MICROSCOPIC (REFLEX)

## 2023-04-01 LAB — LACTIC ACID, PLASMA
Lactic Acid, Venous: 1.6 mmol/L (ref 0.5–1.9)
Lactic Acid, Venous: 1.2 mmol/L (ref 0.5–1.9)

## 2023-04-01 LAB — PROTIME-INR
INR: 1.3 — ABNORMAL HIGH (ref 0.8–1.2)
Prothrombin Time: 16.1 seconds — ABNORMAL HIGH (ref 11.4–15.2)

## 2023-04-01 LAB — CULTURE, BLOOD (ROUTINE X 2)

## 2023-04-01 LAB — T4, FREE: Free T4: 1.54 ng/dL — ABNORMAL HIGH (ref 0.61–1.12)

## 2023-04-01 LAB — CK: Total CK: 156 U/L (ref 49–397)

## 2023-04-01 LAB — PHOSPHORUS: Phosphorus: 1.4 mg/dL — ABNORMAL LOW (ref 2.5–4.6)

## 2023-04-01 LAB — SARS CORONAVIRUS 2 BY RT PCR: SARS Coronavirus 2 by RT PCR: NEGATIVE

## 2023-04-01 LAB — APTT: aPTT: 40 seconds — ABNORMAL HIGH (ref 24–36)

## 2023-04-01 LAB — MRSA NEXT GEN BY PCR, NASAL: MRSA by PCR Next Gen: NOT DETECTED

## 2023-04-01 LAB — TROPONIN I (HIGH SENSITIVITY): Troponin I (High Sensitivity): 1376 ng/L (ref <18)

## 2023-04-01 LAB — D-DIMER, QUANTITATIVE: D-Dimer, Quant: 1.2 ug/mL-FEU — ABNORMAL HIGH (ref 0.00–0.50)

## 2023-04-01 LAB — MAGNESIUM: Magnesium: 2.1 mg/dL (ref 1.7–2.4)

## 2023-04-01 LAB — PROCALCITONIN: Procalcitonin: 1.27 ng/mL

## 2023-04-01 MED ORDER — SODIUM CHLORIDE 0.9 % IV SOLN
1.0000 g | Freq: Once | INTRAVENOUS | Status: AC
  Administered 2023-04-01: 1 g via INTRAVENOUS
  Filled 2023-04-01: qty 10

## 2023-04-01 MED ORDER — SIMVASTATIN 20 MG PO TABS
40.0000 mg | ORAL_TABLET | Freq: Every day | ORAL | Status: DC
  Administered 2023-04-02 – 2023-04-04 (×3): 40 mg via ORAL
  Filled 2023-04-01 (×3): qty 2

## 2023-04-01 MED ORDER — HEPARIN (PORCINE) 25000 UT/250ML-% IV SOLN
1700.0000 [IU]/h | INTRAVENOUS | Status: DC
  Administered 2023-04-01: 1000 [IU]/h via INTRAVENOUS
  Administered 2023-04-02: 1200 [IU]/h via INTRAVENOUS
  Administered 2023-04-03: 1700 [IU]/h via INTRAVENOUS
  Filled 2023-04-01 (×3): qty 250

## 2023-04-01 MED ORDER — SODIUM CHLORIDE 0.9 % IV SOLN
2.0000 g | Freq: Two times a day (BID) | INTRAVENOUS | Status: DC
  Administered 2023-04-01 – 2023-04-03 (×4): 2 g via INTRAVENOUS
  Filled 2023-04-01 (×4): qty 12.5

## 2023-04-01 MED ORDER — HYDROCODONE-ACETAMINOPHEN 5-325 MG PO TABS
1.0000 | ORAL_TABLET | ORAL | Status: DC | PRN
  Administered 2023-04-02: 2 via ORAL
  Filled 2023-04-01: qty 2

## 2023-04-01 MED ORDER — ONDANSETRON HCL 4 MG/2ML IJ SOLN: 4.0000 mg | Freq: Four times a day (QID) | INTRAMUSCULAR | Status: DC | PRN

## 2023-04-01 MED ORDER — HEPARIN BOLUS VIA INFUSION
4000.0000 [IU] | Freq: Once | INTRAVENOUS | Status: AC
  Administered 2023-04-01: 4000 [IU] via INTRAVENOUS
  Filled 2023-04-01: qty 4000

## 2023-04-01 MED ORDER — VANCOMYCIN HCL IN DEXTROSE 1-5 GM/200ML-% IV SOLN: 1000.0000 mg | Freq: Once | INTRAVENOUS | Status: DC

## 2023-04-01 MED ORDER — POTASSIUM CHLORIDE CRYS ER 20 MEQ PO TBCR
40.0000 meq | EXTENDED_RELEASE_TABLET | Freq: Once | ORAL | Status: AC
  Administered 2023-04-01: 40 meq via ORAL
  Filled 2023-04-01: qty 2

## 2023-04-01 MED ORDER — DILTIAZEM LOAD VIA INFUSION
10.0000 mg | Freq: Once | INTRAVENOUS | Status: AC
  Administered 2023-04-01: 10 mg via INTRAVENOUS
  Filled 2023-04-01: qty 10

## 2023-04-01 MED ORDER — SODIUM CHLORIDE 0.9 % IV SOLN: INTRAVENOUS | Status: AC

## 2023-04-01 MED ORDER — ACETAMINOPHEN 325 MG PO TABS: 650.0000 mg | ORAL_TABLET | Freq: Four times a day (QID) | ORAL | Status: DC | PRN

## 2023-04-01 MED ORDER — ALBUTEROL SULFATE (2.5 MG/3ML) 0.083% IN NEBU: 2.5000 mg | INHALATION_SOLUTION | RESPIRATORY_TRACT | Status: DC | PRN

## 2023-04-01 MED ORDER — SODIUM CHLORIDE 0.9 % IV BOLUS
500.0000 mL | Freq: Once | INTRAVENOUS | Status: AC
  Administered 2023-04-01: 500 mL via INTRAVENOUS

## 2023-04-01 MED ORDER — SODIUM CHLORIDE 0.9 % IV SOLN: INTRAVENOUS | Status: DC

## 2023-04-01 MED ORDER — VANCOMYCIN HCL IN DEXTROSE 1-5 GM/200ML-% IV SOLN
1000.0000 mg | INTRAVENOUS | Status: DC
  Administered 2023-04-01 – 2023-04-03 (×2): 1000 mg via INTRAVENOUS
  Filled 2023-04-01 (×2): qty 200

## 2023-04-01 MED ORDER — SODIUM CHLORIDE 0.9 % IV SOLN: 2.0000 g | Freq: Once | INTRAVENOUS | Status: DC

## 2023-04-01 MED ORDER — ACETAMINOPHEN 650 MG RE SUPP: 650.0000 mg | Freq: Four times a day (QID) | RECTAL | Status: DC | PRN

## 2023-04-01 MED ORDER — DILTIAZEM HCL-DEXTROSE 125-5 MG/125ML-% IV SOLN (PREMIX)
5.0000 mg/h | INTRAVENOUS | Status: DC
  Administered 2023-04-01: 5 mg/h via INTRAVENOUS
  Administered 2023-04-01: 7.5 mg/h via INTRAVENOUS
  Filled 2023-04-01 (×2): qty 125

## 2023-04-01 MED ORDER — ONDANSETRON HCL 4 MG PO TABS: 4.0000 mg | ORAL_TABLET | Freq: Four times a day (QID) | ORAL | Status: DC | PRN

## 2023-04-01 MED ORDER — MONTELUKAST SODIUM 10 MG PO TABS
10.0000 mg | ORAL_TABLET | Freq: Every day | ORAL | Status: DC
  Administered 2023-04-01 – 2023-04-03 (×3): 10 mg via ORAL
  Filled 2023-04-01 (×3): qty 1

## 2023-04-01 NOTE — Assessment & Plan Note (Signed)
Continue Zocor 40 mg a day.

## 2023-04-01 NOTE — Assessment & Plan Note (Addendum)
Chest x-ray showed no evidence of pneumonia.  Given significant shortness of breath and recent traveling will obtain CTA to further evaluate Check COVID

## 2023-04-01 NOTE — Assessment & Plan Note (Signed)
Mild will gently rehydrate and follow-up daily electrolytes

## 2023-04-01 NOTE — Progress Notes (Signed)
ANTICOAGULATION CONSULT NOTE - Initial Consult  Pharmacy Consult for Heparin Indication: atrial fibrillation  No Known Allergies  Patient Measurements: Height: 5\' 8"  (172.7 cm) Weight: 76.1 kg (167 lb 12.8 oz) IBW/kg (Calculated) : 68.4 Heparin Dosing Weight: 74.4 kg  Vital Signs: Temp: 98.3 F (36.8 C) (07/23 2025) Temp Source: Oral (07/23 2025) BP: 116/69 (07/23 2025) Pulse Rate: 79 (07/23 2025)  Labs: Recent Labs    04/01/23 0955  HGB 14.2  HCT 42.8  PLT 189  CREATININE 1.46*    Estimated Creatinine Clearance: 41.6 mL/min (A) (by C-G formula based on SCr of 1.46 mg/dL (H)).   Medical History: Past Medical History:  Diagnosis Date   Anemia    SLIGHT ANEMIA 4 MONTHS AGO   Aortic valve stenosis 12/19/2017   Echo 07/30/16 - EF 55-60, mild aortic stenosis (mean 9) // Echo 4/19:  EF 60-65 mild AS (mean 11) // Echocardiogram 11/21: EF 60-65, no RWMA, mild LVH, Gr 1 DD, normal RVSF, trivial MR, calcified AV, mod AS (mean 20 mmHg, Vmax 280 cm/s, DI 0.27), trivial AI  // Echo 5/22: EF 60-65, moderate LVH, no RWMA, trivial MR, moderate AS (mean gradient 20 mmHg, V-max 287 cm/s, DI 0.35)    Arthritis    OA   Benign prostate hyperplasia    unspecified whether lower urinary tract sym. present    Carotid atherosclerosis    Carotid US 4/19:  bilat ICA 1-39; vertebrals and subclavians normal   Carotid atherosclerosis, bilateral 04/30/2018   1-39% on 12/2017 Care Everywhere Echo   Coronary artery calcification seen on CT scan 06/05/2018   Dysrhythmia    ATRIAL FIB   Elevated prostate specific antigen (PSA) 08/30/2016   Glucose intolerance    "pre-diabetic"   Hayfever    Heart murmur    History of exercise stress test    GXT 4/19:  ETT with good exercise tolerance (10:00); no chest pain; normal BP response; no diagnostic ST changes; negative adequate ETT.   HOH (hard of hearing)    BOTH EARS   Low back pain with sciatica 06/02/2012   Lumbar herniated disc 01/30/2018    Mild aortic stenosis 04/30/2018   12/2017 Care Everywhere Echo : Mean gradient (S): 11 mm Hg. Peak gradient (S): 24 mm Hg.   Numbness of right thumb    FROM CERVICAL NECK SURGERY   Osteoarthritis of right hip 09/21/2018   Overweight (BMI 25.0-29.9) 09/30/2018   Paroxysmal atrial fibrillation (HCC) 06/05/2018   Pre-diabetes    Prediabetes 04/30/2018   not sure about this   Prostatitis    unspecified prostatitis type   Pure hypercholesterolemia     Medications:  Scheduled:   montelukast  10 mg Oral QHS   [START ON 04/02/2023] simvastatin  40 mg Oral Daily   Infusions:   [START ON 04/02/2023] sodium chloride     sodium chloride     ceFEPime (MAXIPIME) IV     diltiazem (CARDIZEM) infusion 7.5 mg/hr (04/01/23 1902)   vancomycin     PRN: acetaminophen **OR** acetaminophen, albuterol, HYDROcodone-acetaminophen, ondansetron **OR** ondansetron (ZOFRAN) IV  Assessment: 77 yo male presents with afib of unknown duration. Pharmacy consulted to dose IV heparin. Not on anticoagulation PTA.   Baseline aPTT, PT/INR pending. CBC WNL. SCr 1.46  Goal of Therapy:  Heparin level 0.3-0.7 units/ml Monitor platelets by anticoagulation protocol: Yes   Plan:  Give 4000 units bolus x 1 Start heparin infusion at 1000 units/hr Check anti-Xa level in 8 hours and daily while on  heparin Continue to monitor H&H and platelets  Loralee Pacas, PharmD, BCPS 04/01/2023,9:55 PM  Please check AMION for all [facility] Pharmacy phone numbers After 10:00 PM, call Main Pharmacy [phone]

## 2023-04-01 NOTE — ED Notes (Signed)
Report attempted to the floor RN.Marland Kitchen

## 2023-04-01 NOTE — ED Notes (Signed)
Cody Mcconnell with cl called for transport

## 2023-04-01 NOTE — ED Provider Notes (Signed)
[place] EMERGENCY DEPARTMENT AT [facility] Provider Note   CSN: [id number] Arrival date & time: 04/01/23  2130     History  Chief Complaint  Patient presents with   Leg Swelling    left    Cody Mcconnell is a 77 y.o. male.  Patient not feeling well for several days.  Has been in bed.  No cough no upper respiratory symptoms.  Said some redness swelling to his left leg.  He has been feeling very fatigued however.  No chest pain.  Patient has a history of atrial fibrillation and is not anticoagulated.  Also has a history of severe aortic stenosis.  Local history significant for high cholesterol aortic valve stenosis hard of hearing atrial fibrillation.  Patient recently seen by cardiology on June 3.  No immediate plan to do anything about the aortic valve other than following carefully precautions provided.  Patient's heart rate here was about 168.  Patient cannot tell it was going fast.  Patient is a former smoker quit in 1978.       Home Medications Prior to Admission medications   Medication Sig Start Date End Date Taking? Authorizing Provider  cetirizine (ZYRTEC) 10 MG tablet Take 10 mg by mouth daily.   Yes [provider]  diclofenac Sodium (VOLTAREN) 1 % GEL Apply 1 Application topically 2 (two) times daily as needed (joint pain).   Yes [provider]  fluticasone (FLONASE) 50 MCG/ACT nasal spray Place 1 spray into both nostrils daily as needed for allergies or rhinitis.   Yes [provider]  montelukast (SINGULAIR) 10 MG tablet Take 10 mg by mouth at bedtime.   Yes [provider]  naproxen sodium (ALEVE) 220 MG tablet Take 440 mg by mouth daily.   Yes [provider]  sildenafil (VIAGRA) 100 MG tablet Take 100 mg by mouth daily as needed for erectile dysfunction.   Yes [provider]  simvastatin (ZOCOR) 40 MG tablet Take 40 mg by mouth daily.   Yes [provider]   triamterene-hydrochlorothiazide (DYAZIDE) 37.5-25 MG capsule Take 1 capsule by mouth every morning. 07/05/21  Yes [provider]  albuterol (VENTOLIN HFA) 108 (90 Base) MCG/ACT inhaler Inhale 2 puffs into the lungs every 4 (four) hours as needed for wheezing. Patient not taking: Reported on 04/01/2023 03/17/23 04/16/23  [provider]      Allergies    Patient has no known allergies.    Review of Systems   Review of Systems  Constitutional:  Positive for fatigue. Negative for chills and fever.  HENT:  Negative for ear pain and sore throat.   Eyes:  Negative for pain and visual disturbance.  Respiratory:  Negative for cough and shortness of breath.   Cardiovascular:  Positive for palpitations and leg swelling. Negative for chest pain.  Gastrointestinal:  Negative for abdominal pain and vomiting.  Genitourinary:  Negative for dysuria and hematuria.  Musculoskeletal:  Negative for arthralgias and back pain.  Skin:  Negative for color change and rash.  Neurological:  Negative for seizures and syncope.  All other systems reviewed and are negative.   Physical Exam Updated Vital Signs BP 106/66   Pulse (!) 106   Temp 97.6 F (36.4 C) (Oral)   Resp (!) 33   Ht 1.727 m (5\' 8" )   Wt 74.4 kg   SpO2 97%   BMI 24.94 kg/m  Physical Exam Vitals and nursing note reviewed.  Constitutional:      General:  He is not in acute distress.    Appearance: Normal appearance. He is well-developed. He is not ill-appearing.  HENT:     Head: Normocephalic and atraumatic.  Eyes:     Extraocular Movements: Extraocular movements intact.     Conjunctiva/sclera: Conjunctivae normal.     Pupils: Pupils are equal, round, and reactive to light.  Cardiovascular:     Rate and Rhythm: Tachycardia present. Rhythm irregular.     Heart sounds: No murmur heard. Pulmonary:     Effort: Pulmonary effort is normal. No respiratory distress.     Breath sounds: Normal breath sounds.  Abdominal:      Palpations: Abdomen is soft.     Tenderness: There is no abdominal tenderness.  Musculoskeletal:        General: No swelling.     Cervical back: Normal range of motion and neck supple.     Comments: Left leg with some erythema up to the knee and then streaking to the medial thigh.  No purulent discharge.  Skin:    General: Skin is warm and dry.     Capillary Refill: Capillary refill takes less than 2 seconds.  Neurological:     General: No focal deficit present.     Mental Status: He is alert and oriented to person, place, and time.     Cranial Nerves: No cranial nerve deficit.     Sensory: No sensory deficit.     Motor: No weakness.  Psychiatric:        Mood and Affect: Mood normal.     ED Results / Procedures / Treatments   Labs (all labs ordered are listed, but only abnormal results are displayed) Labs Reviewed  CBC WITH DIFFERENTIAL/PLATELET - Abnormal; Notable for the following components:      Result Value   WBC 10.8 (*)    Neutro Abs 9.0 (*)    Abs Immature Granulocytes 0.09 (*)    All other components within normal limits  COMPREHENSIVE METABOLIC PANEL - Abnormal; Notable for the following components:   Sodium 132 (*)    Potassium 3.2 (*)    Chloride 96 (*)    Glucose, Bld 146 (*)    Creatinine, Ser 1.46 (*)    GFR, Estimated 50 (*)    All other components within normal limits  URINALYSIS, ROUTINE W REFLEX MICROSCOPIC - Abnormal; Notable for the following components:   Hgb urine dipstick SMALL (*)    All other components within normal limits  URINALYSIS, MICROSCOPIC (REFLEX) - Abnormal; Notable for the following components:   Bacteria, UA FEW (*)    All other components within normal limits  CULTURE, BLOOD (ROUTINE X 2)  CULTURE, BLOOD (ROUTINE X 2)  URINE CULTURE  LACTIC ACID, PLASMA  LACTIC ACID, PLASMA    EKG EKG Interpretation Date/Time:  Tuesday April 01 2023 09:39:06 EDT Ventricular Rate:  168 PR Interval:    QRS Duration:  90 QT  Interval:  299 QTC Calculation: 500 R Axis:   25  Text Interpretation: Atrial fibrillation with rapid V-rate Probable LVH with secondary repol abnrm ST depression, probably rate related Confirmed by Vanetta Mulders [phone]) on 04/01/2023 10:21:46 AM  Radiology US Venous Img Lower  Left (DVT Study)  Result Date: 04/01/2023 CLINICAL DATA:  Redness and swelling EXAM: Left LOWER EXTREMITY VENOUS DOPPLER ULTRASOUND TECHNIQUE: Gray-scale sonography with compression, as well as color and duplex ultrasound, were performed to evaluate the deep venous system(s) from the level of the common femoral vein through the popliteal  and proximal calf veins. COMPARISON:  None Available. FINDINGS: VENOUS Normal compressibility of the common femoral, superficial femoral, and popliteal veins, as well as the visualized calf veins. Visualized portions of profunda femoral vein and great saphenous vein unremarkable. No filling defects to suggest DVT on grayscale or color Doppler imaging. Doppler waveforms show normal direction of venous flow, normal respiratory plasticity and response to augmentation. Limited views of the contralateral common femoral vein are unremarkable. OTHER None. Limitations: none IMPRESSION: No evidence of left lower extremity DVT Electronically Signed   By: Karen Kays M.D.   On: 04/01/2023 11:15   DG Tibia/Fibula Left  Result Date: 04/01/2023 CLINICAL DATA:  Redness swelling ?DVT EXAM: LEFT TIBIA AND FIBULA - 2 VIEW COMPARISON:  None Available. FINDINGS: No acute fracture or dislocation. No aggressive osseous lesion. There are degenerative changes of the knee joint in the form of mildly reduced medial tibio-femoral compartment joint space, tibial spiking and tricompartmental osteophytosis. Ankle mortise appears intact. No focal soft tissue swelling. No radiopaque foreign bodies. IMPRESSION: *No acute osseous abnormality. *Mild degenerative changes of the left knee joint. Electronically Signed   By: Jules Schick M.D.   On: 04/01/2023 10:37   DG Chest Port 1 View  Result Date: 04/01/2023 CLINICAL DATA:  Palpitations EXAM: PORTABLE CHEST 1 VIEW COMPARISON:  CT scan chest from 11/26/2022. FINDINGS: Bilateral lung fields are clear. Bilateral costophrenic angles are clear. Normal cardio-mediastinal silhouette. No acute osseous abnormalities. Partially seen lower cervical spinal fixation hardware. The soft tissues are within normal limits. IMPRESSION: No active disease. Electronically Signed   By: Jules Schick M.D.   On: 04/01/2023 10:35    Procedures Procedures    Medications Ordered in ED Medications  0.9 %  sodium chloride infusion (has no administration in time range)  diltiazem (CARDIZEM) 1 mg/mL load via infusion 10 mg (10 mg Intravenous Bolus from Bag 04/01/23 1020)    And  diltiazem (CARDIZEM) 125 mg in dextrose 5% 125 mL (1 mg/mL) infusion (7.5 mg/hr Intravenous Rate/Dose Change 04/01/23 1054)  sodium chloride 0.9 % bolus 500 mL (0 mLs Intravenous Stopped 04/01/23 1036)  cefTRIAXone (ROCEPHIN) 1 g in sodium chloride 0.9 % 100 mL IVPB (0 g Intravenous Stopped 04/01/23 1104)    ED Course/ Medical Decision Making/ A&P                             Medical Decision Making Amount and/or Complexity of Data Reviewed Labs: ordered. Radiology: ordered.  Risk Prescription drug management. Decision regarding hospitalization.   Patient here with rapid atrial fibrillation.  Not a candidate for cardioversion does know how long the heart going fast.  For that we started him on Cardizem and got a 10 mg bolus and then drip.  Heart rate is better controlled into the low 100s.  Doppler studies of the left leg without evidence of DVT.  X-rays of the tib-fib without any bony abnormalities.  Urinalysis originally was reported as greater than 50 whites.  But this been corrected he only has 0-5 few bacteria.  Urine sent for culture.  But does not appear to be consistent with urinary tract infection.   Blood cultures are pending.  CBC white count 10.8 hemoglobin 14.2 platelets 189.  Complete metabolic panel GFR 50 creatinine 1.46 potassium a little low at 3.2 will give some oral potassium for that.  Glucose good sodium 132 lactic acid normal at 1.6.  Doppler study negative for DVT chest  x-ray negative for acute process tib-fib x-rays also are negative for any acute osseous abnormality.  Patient is atrial fibs better controlled on diltiazem drip.  Patient given Rocephin for the cellulitis of the leg.  Will call the hospitalist for admission.    Final Clinical Impression(s) / ED Diagnoses Final diagnoses:  Atrial fibrillation with RVR (HCC)  Cellulitis of left lower extremity  Hypokalemia    Rx / DC Orders ED Discharge Orders     None         Vanetta Mulders, MD 04/01/23 1249

## 2023-04-01 NOTE — Assessment & Plan Note (Signed)
Hold methotrexate

## 2023-04-01 NOTE — Assessment & Plan Note (Addendum)
-  admit per  cellulitis protocol will       Vancomycin and cefepime      plain films showed:   no evidence of air  no evidence of osteomyelitis   no               foreign   objects      Will obtain MRSA screening,  Dopplers negative for DVT      blood cultures pending     further antibiotic adjustment pending above results

## 2023-04-01 NOTE — Assessment & Plan Note (Signed)
-   Admit to step down on Cardizem drip       CHA2D-VASC score  4       Will start on  Heparin,               Check TSH      Cycle cardiac enzymes      Obtain ECHO      Cardiology consult in AM

## 2023-04-01 NOTE — Assessment & Plan Note (Signed)
Apprecaite cardiolgy consult  On heparin serial ECG  Repeat echo in AM  cont to cycle CE Obtain CTA

## 2023-04-01 NOTE — ED Triage Notes (Addendum)
Pt to ED c/o left lower leg swelling/redness that was noticed today. No known injury. Pt does report being"sick" since Saturday and staying in the bed. Reports lack of energy, decrease appetite. Pt does report SHOB/"Winded easily"

## 2023-04-01 NOTE — ED Notes (Signed)
Pt aware of the need for a urine... Unable to currently provide the sample... 

## 2023-04-01 NOTE — Progress Notes (Signed)
Pharmacy Antibiotic Note  Cody Mcconnell is a 77 y.o. male admitted on 04/01/2023 with cellulitis.  Pharmacy has been consulted for vancomycin and cefepime dosing.  Plan: Cefepime 2g IV q12h Vancomycin 1g IV q24h for estimated AUC 469 using SCr 1.46, Vd 0.72 Consider vancomycin levels at steady state, goal AUC 400-550 Follow up renal function & cultures, clinical course   Height: 5\' 8"  (172.7 cm) Weight: 76.1 kg (167 lb 12.8 oz) IBW/kg (Calculated) : 68.4  Temp (24hrs), Avg:98.5 F (36.9 C), Min:97.6 F (36.4 C), Max:98.9 F (37.2 C)  Recent Labs  Lab 04/01/23 0955 04/01/23 1155  WBC 10.8*  --   CREATININE 1.46*  --   LATICACIDVEN 1.6 1.2    Estimated Creatinine Clearance: 41.6 mL/min (A) (by C-G formula based on SCr of 1.46 mg/dL (H)).    No Known Allergies  Antimicrobials this admission: 7/23 Vancomycin >> 7/23 Cefepime >>  Dose adjustments this admission:  Microbiology results: 7/23 BCx:  7/23 UCx: 7/23 MRSA PCR:  Thank you for allowing pharmacy to be a part of this patient's care.  Loralee Pacas, PharmD, BCPS 04/01/2023 10:08 PM  Please check AMION for all [facility] Pharmacy phone numbers After 10:00 PM, call Main Pharmacy [phone]

## 2023-04-01 NOTE — Subjective & Objective (Signed)
Patient presents with left lower leg swelling and redness for the past 1 day has not been feeling with and has been staying in bed all Saturday markable energy decreased appetite [place] more short of breath Fatigued no chest pain he has no history of A-fib not on anticoagulation Also history of CVA aortic stenosis On arrival to emergency department heart rate was 168 Was found to be in A-fib with RVR Started on Cardizem drip Given a dose of Rocephin Doppler showed no evidence of DVT Plain images showed no evidence of infection Heart rate improved on diltiazem drip

## 2023-04-01 NOTE — Assessment & Plan Note (Signed)
Appreciate cardiology consult °

## 2023-04-01 NOTE — Assessment & Plan Note (Signed)
Replace recheck and check magnesium and phosphorus levels

## 2023-04-01 NOTE — H&P (Signed)
Cody Mcconnell:[id number] DOB: May 05, 1946 DOA: 04/01/2023     PCP: Linus Galas, NP   Outpatient Specialists:  CARDS:  Dr. Tonny Bollman, MD   Rhematology Dr.Sayde NS Elsner  Patient arrived to ER on 04/01/23 at [phone] Referred by Attending No att. providers found   Patient coming from:    home Lives With family    Chief Complaint:   Chief Complaint  Patient presents with   Leg Swelling    left    HPI: Cody Mcconnell is a 77 y.o. male with medical history significant of A-fib hyperlipidemia  Comorbid conditions include coronary artery calcification, hypertension, and mixed hyperlipidemia, RA,   Presented with leg pain and swelling of the left leg and shortness of breath  Patient presents with left lower leg swelling and redness for the past 1 day has not been feeling with and has been staying in bed all Saturday markable energy decreased appetite [place] more short of breath Fatigued no chest pain he has no history of A-fib not on anticoagulation Also history of CVA aortic stenosis On arrival to emergency department heart rate was 168 Was found to be in A-fib with RVR Started on Cardizem drip Given a dose of Rocephin Doppler showed no evidence of DVT Plain images showed no evidence of infection Heart rate improved on diltiazem drip    The patient has not had any episodes of atrial fibrillation over the past few years and he does not take any oral anticoagulant drugs.  Have had some chills and nausea/vomiting [place] the [place] Any exertion makes him Short of breath  Recently traveled to [place] few weeks ago Have had a Bronchitis  Has not restarted his methotrexate Last time he used prednisone was 2 wks ago  He reports he does not feel when his HR is going up Denies significant ETOH intake   Does not smoke   Lab Results  Component Value Date   SARSCOV2NAA NEGATIVE 08/10/2021   SARSCOV2NAA NEGATIVE 06/28/2020   Regarding  pertinent Chronic problems:    Hyperlipidemia - on statins Zocor   HTN on Dyazide  CV aortic stenosis- last echo  Recent Results (from the past 14782 hour(s))  ECHOCARDIOGRAM COMPLETE   Collection Time: 02/10/23 10:31 AM  Result Value   Area-P 1/2 3.59   S' Lateral 3.10   AV Area mean vel 0.75   AR max vel 0.80   AV Area VTI 0.82   P 1/2 time 320   Ao pk vel 3.38   AV Mean grad 28.0   AV Peak grad 45.6   Est EF 55 - 60%   Narrative      ECHOCARDIOGRAM REPORT     IMPRESSIONS    1. Left ventricular ejection fraction, by estimation, is 55 to 60%. The left ventricle has normal function. The left ventricle has no regional wall motion abnormalities. Left ventricular diastolic parameters are indeterminate.  2. Right ventricular systolic function is normal. The right ventricular size is normal.  3. The mitral valve is normal in structure. No evidence of mitral valve regurgitation. No evidence of mitral stenosis.  4. The aortic valve is calcified. Aortic valve regurgitation is mild. Moderate to severe aortic valve stenosis. Aortic regurgitation PHT measures 320 msec. Aortic valve area, by VTI measures 0.82 cm. Aortic valve mean gradient measures 28.0 mmHg.  Aortic valve Vmax measures 3.38 m/s, DI 0.29, Indexed AVA 0.44 cm2/m2, SVI 32.  5. The inferior vena cava is  normal in size with greater than 50% respiratory variability, suggesting right atrial pressure of 3 mmHg.            CAD  - On  statin,                  -  followed by cardiology          A. Fib -  - CHA2DS2 vas score  4    Not on anticoagulation secondary to some bruising     CKD stage IIIa- baseline Cr 1.2    Lab Results  Component Value Date   CREATININE 1.46 (H) 04/01/2023   CREATININE 1.10 06/27/2022   CREATININE 1.21 02/25/2022     While in ER:   Chest x-ray was unremarkable no evidence of hypoxia Plain images of the left leg unremarkable Dopplers negative for DVT   Lab Orders         Culture, blood  (Routine X 2) w Reflex to ID Panel         Urine Culture         CBC with Differential/Platelet         Comprehensive metabolic panel         Urinalysis, Routine w reflex microscopic -Urine, Clean Catch         Lactic acid, plasma         Urinalysis, Microscopic (reflex)     CXR - NON acute   CTA chest - ***nonacute, no PE, * no evidence of infiltrate  Following Medications were ordered in ER: Medications  0.9 %  sodium chloride infusion (has no administration in time range)  diltiazem (CARDIZEM) 1 mg/mL load via infusion 10 mg (10 mg Intravenous Bolus from Bag 04/01/23 1020)    And  diltiazem (CARDIZEM) 125 mg in dextrose 5% 125 mL (1 mg/mL) infusion (7.5 mg/hr Intravenous Infusion Verify 04/01/23 1902)  sodium chloride 0.9 % bolus 500 mL (0 mLs Intravenous Stopped 04/01/23 1036)  cefTRIAXone (ROCEPHIN) 1 g in sodium chloride 0.9 % 100 mL IVPB (0 g Intravenous Stopped 04/01/23 1104)  potassium chloride SA (KLOR-CON M) CR tablet 40 mEq (40 mEq Oral Given 04/01/23 1302)    _______________________________________________________ ER Provider Called:     DrMarland Kitchen  They Recommend admit to medicine *** Will see in AM  ***SEEN in ER   ED Triage Vitals  Encounter Vitals Group     BP 04/01/23 0931 120/88     Systolic BP Percentile --      Diastolic BP Percentile --      Pulse Rate 04/01/23 0931 89     Resp 04/01/23 0931 18     Temp 04/01/23 0931 97.6 F (36.4 C)     Temp Source 04/01/23 0931 Oral     SpO2 04/01/23 0931 97 %     Weight 04/01/23 0930 164 lb (74.4 kg)     Height 04/01/23 0930 5\' 8"  (1.727 m)     Head Circumference --      Peak Flow --      Pain Score 04/01/23 0929 3     Pain Loc --      Pain Education --      Exclude from Growth Chart --   ZOXW(96)@     _________________________________________ Significant initial  Findings: Abnormal Labs Reviewed  CBC WITH DIFFERENTIAL/PLATELET - Abnormal; Notable for the following components:      Result Value   WBC 10.8 (*)     Neutro Abs 9.0 (*)  Abs Immature Granulocytes 0.09 (*)    All other components within normal limits  COMPREHENSIVE METABOLIC PANEL - Abnormal; Notable for the following components:   Sodium 132 (*)    Potassium 3.2 (*)    Chloride 96 (*)    Glucose, Bld 146 (*)    Creatinine, Ser 1.46 (*)    GFR, Estimated 50 (*)    All other components within normal limits  URINALYSIS, ROUTINE W REFLEX MICROSCOPIC - Abnormal; Notable for the following components:   Hgb urine dipstick SMALL (*)    All other components within normal limits  URINALYSIS, MICROSCOPIC (REFLEX) - Abnormal; Notable for the following components:   Bacteria, UA FEW (*)    All other components within normal limits  _________________________ Troponin ***ordered Cardiac Panel (last 3 results) No results for input(s): "CKTOTAL", "CKMB", "TROPONINIHS", "RELINDX" in the last 72 hours.   ECG: Ordered Personally reviewed and interpreted by me showing: HR : 168 Rhythm: Atrial fibrillation with rapid V-rate Probable LVH with secondary repol abnrm ST depression, probably rate related QTC 500  BNP (last 3 results) No results for input(s): "BNP" in the last 8760 hours.   COVID-19 Labs  No results for input(s): "DDIMER", "FERRITIN", "LDH", "CRP" in the last 72 hours.  Lab Results  Component Value Date   SARSCOV2NAA NEGATIVE 08/10/2021   SARSCOV2NAA NEGATIVE 06/28/2020     The recent clinical data is shown below. Vitals:   04/01/23 1900 04/01/23 1930 04/01/23 2025 04/01/23 2033  BP: 118/69 114/67 116/69   Pulse: 95 95 79   Resp: (!) 24 (!) 25 (!) 23 20  Temp:   98.3 F (36.8 C)   TempSrc:   Oral   SpO2: 99% 96% 98%   Weight:   76.1 kg   Height:          WBC     Component Value Date/Time   WBC 10.8 (H) 04/01/2023 0955   LYMPHSABS 1.1 04/01/2023 0955   MONOABS 0.5 04/01/2023 0955   EOSABS 0.0 04/01/2023 0955   BASOSABS 0.0 04/01/2023 0955      Lactic Acid, Venous    Component Value Date/Time    LATICACIDVEN 1.2 04/01/2023 1155     Procalcitonin *** Ordered      UA   no evidence of UTI      Urine analysis:    Component Value Date/Time   COLORURINE YELLOW 04/01/2023 1150   APPEARANCEUR CLEAR 04/01/2023 1150   LABSPEC 1.010 04/01/2023 1150   PHURINE 6.0 04/01/2023 1150   GLUCOSEU NEGATIVE 04/01/2023 1150   HGBUR SMALL (A) 04/01/2023 1150   BILIRUBINUR NEGATIVE 04/01/2023 1150   KETONESUR NEGATIVE 04/01/2023 1150   PROTEINUR NEGATIVE 04/01/2023 1150   NITRITE NEGATIVE 04/01/2023 1150   LEUKOCYTESUR NEGATIVE 04/01/2023 1150    Results for orders placed or performed during the hospital encounter of 04/01/23  Culture, blood (Routine X 2) w Reflex to ID Panel     Status: None (Preliminary result)   Collection Time: 04/01/23  9:55 AM   Specimen: Right Antecubital; Blood  Result Value Ref Range Status   Specimen Description   Final    RIGHT ANTECUBITAL BLOOD Performed at [facility], [address], [place], [place] [address]    Special Requests   Final    BOTTLES DRAWN AEROBIC AND ANAEROBIC Blood Culture adequate volume Performed at [facility], [address] [place], [place] [address]    Culture PENDING  Incomplete   Report Status PENDING  Incomplete  Culture, blood (Routine X 2) w Reflex to ID Panel     Status: None (Preliminary result)   Collection Time: 04/01/23 10:00 AM   Specimen: Left Antecubital; Blood  Result Value Ref Range Status   Specimen Description   Final    LEFT ANTECUBITAL BLOOD Performed at [facility], [address] [place], [place] [address]    Special Requests   Final    BOTTLES DRAWN AEROBIC AND ANAEROBIC Blood Culture adequate volume Performed at [facility], [address], [place], [place] [address]    Culture PENDING  Incomplete   Report Status PENDING  Incomplete    ABX started Antibiotics Given (last 72 hours)     Date/Time Action Medication Dose Rate   04/01/23 1014  New Bag/Given   cefTRIAXone (ROCEPHIN) 1 g in sodium chloride 0.9 % 100 mL IVPB 1 g 200 mL/hr      _____________________________________________________ Recent Labs  Lab 04/01/23 0955  NA 132*  K 3.2*  CO2 22  GLUCOSE 146*  BUN 21  CREATININE 1.46*  CALCIUM 9.0    Cr    Up from baseline see below Lab Results  Component Value Date   CREATININE 1.46 (H) 04/01/2023   CREATININE 1.10 06/27/2022   CREATININE 1.21 02/25/2022    Recent Labs  Lab 04/01/23 0955  AST 37  ALT 19  ALKPHOS 61  BILITOT 1.2  PROT 7.4  ALBUMIN 3.9   Lab Results  Component Value Date   CALCIUM 9.0 04/01/2023          Plt: Lab Results  Component Value Date   PLT 189 04/01/2023         Recent Labs  Lab 04/01/23 0955  WBC 10.8*  NEUTROABS 9.0*  HGB 14.2  HCT 42.8  MCV 83.6  PLT 189    HG/HCT * stable,  Down *Up from baseline see below    Component Value Date/Time   HGB 14.2 04/01/2023 0955   HGB 14.4 02/25/2022 1105   HGB 12.1 (L) 06/09/2019 1120   HCT 42.8 04/01/2023 0955   HCT 37.1 (L) 06/09/2019 1120   MCV 83.6 04/01/2023 0955   MCV 74 (L) 06/09/2019 1120      No results for input(s): "LIPASE", "AMYLASE" in the last 168 hours. No results for input(s): "AMMONIA" in the last 168 hours.    .lab  _______________________________________________ Hospitalist was called for admission for *** Atrial fibrillation with RVR (HCC) ***  Cellulitis of left lower extremity ***  Hypokalemia ***    The following Work up has been ordered so far:  Orders Placed This Encounter  Procedures   Culture, blood (Routine X 2) w Reflex to ID Panel   Urine Culture   DG Chest Port 1 View   US Venous Img Lower  Left (DVT Study)   DG Tibia/Fibula Left   CBC with Differential/Platelet   Comprehensive metabolic panel   Urinalysis, Routine w reflex microscopic -Urine, Clean Catch   Lactic acid, plasma   Urinalysis, Microscopic (reflex)   Cardiac Monitoring - Continuous Indefinite    Consult to hospitalist   EKG 12-Lead   Place in observation (patient's expected length of stay will be less than 2 midnights)     OTHER Significant initial  Findings:  labs showing:     DM  labs:  HbA1C: No results for input(s): "HGBA1C" in the last 8760 hours.     CBG (last 3)  No results for input(s): "GLUCAP" in the last 72  hours.        Cultures:    Component Value Date/Time   SDES  04/01/2023 1000    LEFT ANTECUBITAL BLOOD Performed at [facility], [address] [place], [place] [address]    SPECREQUEST  04/01/2023 1000    BOTTLES DRAWN AEROBIC AND ANAEROBIC Blood Culture adequate volume Performed at [facility], [address] [place], [place] [address]    CULT PENDING 04/01/2023 1000   REPTSTATUS PENDING 04/01/2023 1000     Radiological Exams on Admission: US Venous Img Lower  Left (DVT Study)  Result Date: 04/01/2023 CLINICAL DATA:  Redness and swelling EXAM: Left LOWER EXTREMITY VENOUS DOPPLER ULTRASOUND TECHNIQUE: Gray-scale sonography with compression, as well as color and duplex ultrasound, were performed to evaluate the deep venous system(s) from the level of the common femoral vein through the popliteal and proximal calf veins. COMPARISON:  None Available. FINDINGS: VENOUS Normal compressibility of the common femoral, superficial femoral, and popliteal veins, as well as the visualized calf veins. Visualized portions of profunda femoral vein and great saphenous vein unremarkable. No filling defects to suggest DVT on grayscale or color Doppler imaging. Doppler waveforms show normal direction of venous flow, normal respiratory plasticity and response to augmentation. Limited views of the contralateral common femoral vein are unremarkable. OTHER None. Limitations: none IMPRESSION: No evidence of left lower extremity DVT Electronically Signed   By: Karen Kays M.D.   On: 04/01/2023 11:15   DG Tibia/Fibula Left  Result Date:  04/01/2023 CLINICAL DATA:  Redness swelling ?DVT EXAM: LEFT TIBIA AND FIBULA - 2 VIEW COMPARISON:  None Available. FINDINGS: No acute fracture or dislocation. No aggressive osseous lesion. There are degenerative changes of the knee joint in the form of mildly reduced medial tibio-femoral compartment joint space, tibial spiking and tricompartmental osteophytosis. Ankle mortise appears intact. No focal soft tissue swelling. No radiopaque foreign bodies. IMPRESSION: *No acute osseous abnormality. *Mild degenerative changes of the left knee joint. Electronically Signed   By: Jules Schick M.D.   On: 04/01/2023 10:37   DG Chest Port 1 View  Result Date: 04/01/2023 CLINICAL DATA:  Palpitations EXAM: PORTABLE CHEST 1 VIEW COMPARISON:  CT scan chest from 11/26/2022. FINDINGS: Bilateral lung fields are clear. Bilateral costophrenic angles are clear. Normal cardio-mediastinal silhouette. No acute osseous abnormalities. Partially seen lower cervical spinal fixation hardware. The soft tissues are within normal limits. IMPRESSION: No active disease. Electronically Signed   By: Jules Schick M.D.   On: 04/01/2023 10:35   _______________________________________________________________________________________________________ Latest  Blood pressure 116/69, pulse 79, temperature 98.3 F (36.8 C), temperature source Oral, resp. rate 20, height 5\' 8"  (1.727 m), weight 76.1 kg, SpO2 98%.   Vitals  labs and radiology finding personally reviewed  Review of Systems:    Pertinent positives include: ***  Constitutional:  No weight loss, night sweats, Fevers, chills, fatigue, weight loss  HEENT:  No headaches, Difficulty swallowing,Tooth/dental problems,Sore throat,  No sneezing, itching, ear ache, nasal congestion, post nasal drip,  Cardio-vascular:  No chest pain, Orthopnea, PND, anasarca, dizziness, palpitations.no Bilateral lower extremity swelling  GI:  No heartburn, indigestion, abdominal pain, nausea, vomiting,  diarrhea, change in bowel habits, loss of appetite, melena, blood in stool, hematemesis Resp:  no shortness of breath at rest. No dyspnea on exertion, No excess mucus, no productive cough, No non-productive cough, No coughing up of blood.No change in color of mucus.No wheezing. Skin:  no rash or lesions. No jaundice GU:  no dysuria, change in color  of urine, no urgency or frequency. No straining to urinate.  No flank pain.  Musculoskeletal:  No joint pain or no joint swelling. No decreased range of motion. No back pain.  Psych:  No change in mood or affect. No depression or anxiety. No memory loss.  Neuro: no localizing neurological complaints, no tingling, no weakness, no double vision, no gait abnormality, no slurred speech, no confusion  All systems reviewed and apart from HOPI all are negative _______________________________________________________________________________________________ Past Medical History:   Past Medical History:  Diagnosis Date   Anemia    SLIGHT ANEMIA 4 MONTHS AGO   Aortic valve stenosis 12/19/2017   Echo 07/30/16 - EF 55-60, mild aortic stenosis (mean 9) // Echo 4/19:  EF 60-65 mild AS (mean 11) // Echocardiogram 11/21: EF 60-65, no RWMA, mild LVH, Gr 1 DD, normal RVSF, trivial MR, calcified AV, mod AS (mean 20 mmHg, Vmax 280 cm/s, DI 0.27), trivial AI  // Echo 5/22: EF 60-65, moderate LVH, no RWMA, trivial MR, moderate AS (mean gradient 20 mmHg, V-max 287 cm/s, DI 0.35)    Arthritis    OA   Benign prostate hyperplasia    unspecified whether lower urinary tract sym. present    Carotid atherosclerosis    Carotid US 4/19:  bilat ICA 1-39; vertebrals and subclavians normal   Carotid atherosclerosis, bilateral 04/30/2018   1-39% on 12/2017 [facility] Echo   Coronary artery calcification seen on CT scan 06/05/2018   Dysrhythmia    ATRIAL FIB   Elevated prostate specific antigen (PSA) 08/30/2016   Glucose intolerance    "pre-diabetic"   Hayfever     Heart murmur    History of exercise stress test    GXT 4/19:  ETT with good exercise tolerance (10:00); no chest pain; normal BP response; no diagnostic ST changes; negative adequate ETT.   HOH (hard of hearing)    BOTH EARS   Low back pain with sciatica 06/02/2012   Lumbar herniated disc 01/30/2018   Mild aortic stenosis 04/30/2018   12/2017 [facility] Echo : Mean gradient (S): 11 mm Hg. Peak gradient (S): 24 mm Hg.   Numbness of right thumb    FROM CERVICAL NECK SURGERY   Osteoarthritis of right hip 09/21/2018   Overweight (BMI 25.0-29.9) 09/30/2018   Paroxysmal atrial fibrillation (HCC) 06/05/2018   Pre-diabetes    Prediabetes 04/30/2018   not sure about this   Prostatitis    unspecified prostatitis type   Pure hypercholesterolemia       Past Surgical History:  Procedure Laterality Date   ANTERIOR LAT LUMBAR FUSION Right 06/29/2020   Procedure: Right Lumbar Two-Three Anterolateral lumbar interbody fusion with lateral plate;  Surgeon: Maeola Harman, MD;  Location: [facility] OR;  Service: Neurosurgery;  Laterality: Right;  Right Lumbar Two-Three Anterolateral lumbar interbody fusion with lateral plate   CERVICAL SPINE SURGERY  2011   COLONOSCOPY     HIP SURGERY Right    arthroscopy   LUMBAR DISC SURGERY  2014   L2-3, L3 TO L4 X 2 2019 AT [facility]   LUMBAR FUSION  2019   psoas muscle repair (groin muscle?)  2022   SHOULDER ARTHROSCOPY Right    TOOTH EXTRACTION     TOTAL HIP ARTHROPLASTY Right 09/29/2018   Procedure: TOTAL HIP ARTHROPLASTY ANTERIOR APPROACH;  Surgeon: Durene Romans, MD;  Location: [facility] ORS;  Service: Orthopedics;  Laterality: Right;  40    Social History:  Ambulatory *** independently cane, walker  wheelchair bound, bed bound  reports that he quit smoking about 46 years ago. His smoking use included cigarettes. He started smoking about 55 years ago. He has a 18 pack-year smoking history. He has never used smokeless tobacco. He reports that he does not drink  alcohol and does not use drugs.     Family History: *** Family History  Problem Relation Age of Onset   Dementia Mother    Congestive Heart Failure Father    CAD Father        quad bypass at age 61   CAD Brother 39   ______________________________________________________________________________________________ Allergies: No Known Allergies   Prior to Admission medications   Medication Sig Start Date End Date Taking? Authorizing Provider  cetirizine (ZYRTEC) 10 MG tablet Take 10 mg by mouth daily.   Yes [provider]  diclofenac Sodium (VOLTAREN) 1 % GEL Apply 1 Application topically 2 (two) times daily as needed (joint pain).   Yes [provider]  fluticasone (FLONASE) 50 MCG/ACT nasal spray Place 1 spray into both nostrils daily as needed for allergies or rhinitis.   Yes [provider]  montelukast (SINGULAIR) 10 MG tablet Take 10 mg by mouth at bedtime.   Yes [provider]  naproxen sodium (ALEVE) 220 MG tablet Take 440 mg by mouth daily.   Yes [provider]  sildenafil (VIAGRA) 100 MG tablet Take 100 mg by mouth daily as needed for erectile dysfunction.   Yes [provider]  simvastatin (ZOCOR) 40 MG tablet Take 40 mg by mouth daily.   Yes [provider]  triamterene-hydrochlorothiazide (DYAZIDE) 37.5-25 MG capsule Take 1 capsule by mouth every morning. 07/05/21  Yes [provider]  albuterol (VENTOLIN HFA) 108 (90 Base) MCG/ACT inhaler Inhale 2 puffs into the lungs every 4 (four) hours as needed for wheezing. Patient not taking: Reported on 04/01/2023 03/17/23 04/16/23  [provider]    ___________________________________________________________________________________________________ Physical Exam:    04/01/2023    8:25 PM 04/01/2023    7:30 PM 04/01/2023    7:00 PM  Vitals with BMI  Weight 167 lbs 13 oz    BMI 25.52    Systolic 116 114 951  Diastolic 69 67 69  Pulse 79 95 95      1. General:  in No ***Acute distress***increased work of breathing ***complaining of severe pain****agitated * Chronically ill *well *cachectic *toxic acutely ill -appearing 2. Psychological: Alert and *** Oriented 3. Head/ENT:   Moist *** Dry Mucous Membranes                          Head Non traumatic, neck supple                          Normal *** Poor Dentition 4. SKIN: normal *** decreased Skin turgor,  Skin clean Dry and intact no rash    5. Heart: Regular rate and rhythm no*** Murmur, no Rub or gallop 6. Lungs: ***Clear to auscultation bilaterally, no wheezes or crackles   7. Abdomen: Soft, ***non-tender, Non distended *** obese ***bowel sounds present 8. Lower extremities: no clubbing, cyanosis, no ***edema 9. Neurologically Grossly intact, moving all 4 extremities equally *** strength 5 out of 5 in all 4 extremities cranial nerves II through XII intact 10. MSK: Normal range of motion    Chart has been reviewed  ______________________________________________________________________________________________  Assessment/Plan 77 y.o. male with medical history significant of A-fib hyperlipidemia  Comorbid conditions include  coronary artery calcification, hypertension, and mixed hyperlipidemia, RA,  Admitted for  Atrial fibrillation with RVR (HCC)    Cellulitis of left lower extremity    Hypokalemia      Present on Admission:  Atrial fibrillation with RVR (HCC)     No problem-specific Assessment & Plan notes found for this encounter.    Other plan as per orders.  DVT prophylaxis:  SCD *** Lovenox       Code Status:    Code Status: Prior FULL CODE as per patient   I had personally discussed CODE STATUS with patient     ACP none  Family Communication:   Family not at  Bedside  plan of care was discussed on the phone with *** Son, Daughter, Wife, Husband, Sister, Brother , father, mother  Diet    Disposition Plan:   *** likely will need placement for  rehabilitation                          Back to current facility when stable                            To home once workup is complete and patient is stable  ***Following barriers for discharge:                            Electrolytes corrected                               Anemia corrected                             Pain controlled with PO medications                               Afebrile, white count improving able to transition to PO antibiotics                             Will need to be able to tolerate PO                            Will likely need home health, home O2, set up                           Will need consultants to evaluate patient prior to discharge  ****EXPECT DC tomorrow       Consult Orders  (From admission, onward)           Start     Ordered   04/01/23 1240  Consult to hospitalist  Kylie with cl called for consult  Once       Provider:  (Not yet assigned)  Question Answer Comment  Place call to: [facility] [id number] atrial fibrillation RVR, left leg cellulitis   Reason for Consult Admit      04/01/23 1240                                Consults called: ***  emailed CArdiology   Admission status:  ED Disposition     ED Disposition  Admit   Condition  --   Comment  Hospital Area: [facility] [facility] [100100]  Level of Care: Progressive [102]  Admit to Progressive based on following criteria: CARDIOVASCULAR & THORACIC of moderate stability with acute coronary syndrome symptoms/low risk myocardial infarction/hypertensive urgency/arrhythmias/heart failure potentially compromising stability and stable post cardiovascular intervention patients.  Interfacility transfer: Yes  May place patient in observation at [facility] or [facility] if equivalent level of care is available:: No  Covid Evaluation: Asymptomatic - no recent exposure (last 10 days) testing not required  Diagnosis: Atrial fibrillation with RVR [facility]) [[id number]]   Admitting Physician: Synetta Fail [[id number]]  Attending Physician: Synetta Fail [id number]           Obs    Level of care        progressive tele indefinitely please discontinue once patient no longer qualifies COVID-19 Labs    Lab Results  Component Value Date   SARSCOV2NAA NEGATIVE 08/10/2021     Precautions: admitted as *** Covid Negative  ***asymptomatic screening protocol****PUI *** covid positive No active isolations ***If Covid PCR is negative  - please DC precautions - would need additional investigation given very high risk for false native test result       Lindamarie Maclachlan 04/01/2023, 9:00 PM ***  [facility]     after 2 AM please page floor coverage PA If 7AM-7PM, please contact the day team taking care of the patient using [email or web address]

## 2023-04-01 NOTE — ED Notes (Addendum)
Dr Otelia Limes paged ERROR Rye Decoste

## 2023-04-01 NOTE — ED Notes (Signed)
Report given to the floor RN.

## 2023-04-02 ENCOUNTER — Observation Stay (HOSPITAL_BASED_OUTPATIENT_CLINIC_OR_DEPARTMENT_OTHER): Payer: Medicare Other

## 2023-04-02 DIAGNOSIS — Z820 Family history of epilepsy and other diseases of the nervous system: Secondary | ICD-10-CM | POA: Diagnosis not present

## 2023-04-02 DIAGNOSIS — N179 Acute kidney failure, unspecified: Secondary | ICD-10-CM | POA: Diagnosis present

## 2023-04-02 DIAGNOSIS — R7303 Prediabetes: Secondary | ICD-10-CM | POA: Diagnosis present

## 2023-04-02 DIAGNOSIS — M069 Rheumatoid arthritis, unspecified: Secondary | ICD-10-CM | POA: Diagnosis present

## 2023-04-02 DIAGNOSIS — I340 Nonrheumatic mitral (valve) insufficiency: Secondary | ICD-10-CM | POA: Diagnosis not present

## 2023-04-02 DIAGNOSIS — Z96641 Presence of right artificial hip joint: Secondary | ICD-10-CM | POA: Diagnosis present

## 2023-04-02 DIAGNOSIS — Z8673 Personal history of transient ischemic attack (TIA), and cerebral infarction without residual deficits: Secondary | ICD-10-CM | POA: Diagnosis not present

## 2023-04-02 DIAGNOSIS — N400 Enlarged prostate without lower urinary tract symptoms: Secondary | ICD-10-CM | POA: Diagnosis present

## 2023-04-02 DIAGNOSIS — E782 Mixed hyperlipidemia: Secondary | ICD-10-CM | POA: Diagnosis present

## 2023-04-02 DIAGNOSIS — I21A1 Myocardial infarction type 2: Secondary | ICD-10-CM | POA: Diagnosis present

## 2023-04-02 DIAGNOSIS — R238 Other skin changes: Secondary | ICD-10-CM | POA: Diagnosis present

## 2023-04-02 DIAGNOSIS — I214 Non-ST elevation (NSTEMI) myocardial infarction: Secondary | ICD-10-CM | POA: Diagnosis not present

## 2023-04-02 DIAGNOSIS — Z87891 Personal history of nicotine dependence: Secondary | ICD-10-CM | POA: Diagnosis not present

## 2023-04-02 DIAGNOSIS — I350 Nonrheumatic aortic (valve) stenosis: Secondary | ICD-10-CM | POA: Diagnosis present

## 2023-04-02 DIAGNOSIS — I2489 Other forms of acute ischemic heart disease: Secondary | ICD-10-CM | POA: Diagnosis not present

## 2023-04-02 DIAGNOSIS — Z8249 Family history of ischemic heart disease and other diseases of the circulatory system: Secondary | ICD-10-CM | POA: Diagnosis not present

## 2023-04-02 DIAGNOSIS — Z981 Arthrodesis status: Secondary | ICD-10-CM | POA: Diagnosis not present

## 2023-04-02 DIAGNOSIS — T380X5A Adverse effect of glucocorticoids and synthetic analogues, initial encounter: Secondary | ICD-10-CM | POA: Diagnosis present

## 2023-04-02 DIAGNOSIS — I4891 Unspecified atrial fibrillation: Secondary | ICD-10-CM | POA: Diagnosis not present

## 2023-04-02 DIAGNOSIS — I2510 Atherosclerotic heart disease of native coronary artery without angina pectoris: Secondary | ICD-10-CM | POA: Diagnosis present

## 2023-04-02 DIAGNOSIS — I10 Essential (primary) hypertension: Secondary | ICD-10-CM | POA: Diagnosis present

## 2023-04-02 DIAGNOSIS — J40 Bronchitis, not specified as acute or chronic: Secondary | ICD-10-CM | POA: Diagnosis present

## 2023-04-02 DIAGNOSIS — I480 Paroxysmal atrial fibrillation: Secondary | ICD-10-CM | POA: Diagnosis present

## 2023-04-02 DIAGNOSIS — Z1152 Encounter for screening for COVID-19: Secondary | ICD-10-CM | POA: Diagnosis not present

## 2023-04-02 DIAGNOSIS — L03116 Cellulitis of left lower limb: Secondary | ICD-10-CM | POA: Diagnosis present

## 2023-04-02 DIAGNOSIS — E876 Hypokalemia: Secondary | ICD-10-CM | POA: Diagnosis present

## 2023-04-02 DIAGNOSIS — E785 Hyperlipidemia, unspecified: Secondary | ICD-10-CM | POA: Diagnosis not present

## 2023-04-02 DIAGNOSIS — Z79899 Other long term (current) drug therapy: Secondary | ICD-10-CM | POA: Diagnosis not present

## 2023-04-02 LAB — CBC
HCT: 35.3 % — ABNORMAL LOW (ref 39.0–52.0)
Hemoglobin: 12.1 g/dL — ABNORMAL LOW (ref 13.0–17.0)
MCH: 28.1 pg (ref 26.0–34.0)
MCHC: 34.3 g/dL (ref 30.0–36.0)
MCV: 82.1 fL (ref 80.0–100.0)
Platelets: 167 10*3/uL (ref 150–400)
RBC: 4.3 MIL/uL (ref 4.22–5.81)
RDW: 13.5 % (ref 11.5–15.5)
WBC: 7.9 10*3/uL (ref 4.0–10.5)
nRBC: 0 % (ref 0.0–0.2)

## 2023-04-02 LAB — COMPREHENSIVE METABOLIC PANEL
ALT: 20 U/L (ref 0–44)
AST: 28 U/L (ref 15–41)
Albumin: 2.6 g/dL — ABNORMAL LOW (ref 3.5–5.0)
Alkaline Phosphatase: 57 U/L (ref 38–126)
Anion gap: 9 (ref 5–15)
BUN: 20 mg/dL (ref 8–23)
CO2: 23 mmol/L (ref 22–32)
Calcium: 7.9 mg/dL — ABNORMAL LOW (ref 8.9–10.3)
Chloride: 99 mmol/L (ref 98–111)
Creatinine, Ser: 1.38 mg/dL — ABNORMAL HIGH (ref 0.61–1.24)
GFR, Estimated: 53 mL/min — ABNORMAL LOW (ref >60)
Glucose, Bld: 160 mg/dL — ABNORMAL HIGH (ref 70–99)
Potassium: 3.3 mmol/L — ABNORMAL LOW (ref 3.5–5.1)
Sodium: 131 mmol/L — ABNORMAL LOW (ref 135–145)
Total Bilirubin: 0.9 mg/dL (ref 0.3–1.2)
Total Protein: 5.8 g/dL — ABNORMAL LOW (ref 6.5–8.1)

## 2023-04-02 LAB — ECHOCARDIOGRAM COMPLETE
AR max vel: 0.87 cm2
AV Area VTI: 0.8 cm2
AV Area mean vel: 0.82 cm2
AV Mean grad: 31.2 mmHg
AV Peak grad: 50.2 mmHg
Ao pk vel: 3.54 m/s
Area-P 1/2: 4.17 cm2
Height: 68 in
S' Lateral: 3.3 cm
Weight: 2720 oz

## 2023-04-02 LAB — URINE CULTURE: Culture: NO GROWTH

## 2023-04-02 LAB — TROPONIN I (HIGH SENSITIVITY)
Troponin I (High Sensitivity): 1416 ng/L (ref <18)
Troponin I (High Sensitivity): 1227 ng/L (ref <18)

## 2023-04-02 LAB — HEPARIN LEVEL (UNFRACTIONATED)
Heparin Unfractionated: <0.1 IU/mL — ABNORMAL LOW (ref 0.30–0.70)
Heparin Unfractionated: <0.1 IU/mL — ABNORMAL LOW (ref 0.30–0.70)

## 2023-04-02 LAB — CULTURE, BLOOD (ROUTINE X 2)

## 2023-04-02 LAB — PHOSPHORUS: Phosphorus: 1.6 mg/dL — ABNORMAL LOW (ref 2.5–4.6)

## 2023-04-02 LAB — MAGNESIUM: Magnesium: 1.9 mg/dL (ref 1.7–2.4)

## 2023-04-02 MED ORDER — POTASSIUM CHLORIDE CRYS ER 20 MEQ PO TBCR
40.0000 meq | EXTENDED_RELEASE_TABLET | Freq: Once | ORAL | Status: AC
  Administered 2023-04-02: 40 meq via ORAL
  Filled 2023-04-02: qty 2

## 2023-04-02 MED ORDER — METOPROLOL TARTRATE 12.5 MG HALF TABLET
12.5000 mg | ORAL_TABLET | Freq: Four times a day (QID) | ORAL | Status: DC
  Administered 2023-04-02: 12.5 mg via ORAL
  Filled 2023-04-02: qty 1

## 2023-04-02 MED ORDER — HEPARIN BOLUS VIA INFUSION
1000.0000 [IU] | Freq: Once | INTRAVENOUS | Status: AC
  Administered 2023-04-02: 1000 [IU] via INTRAVENOUS
  Filled 2023-04-02: qty 1000

## 2023-04-02 MED ORDER — IOHEXOL 350 MG/ML SOLN
75.0000 mL | Freq: Once | INTRAVENOUS | Status: AC | PRN
  Administered 2023-04-02: 75 mL via INTRAVENOUS

## 2023-04-02 MED ORDER — POTASSIUM PHOSPHATES 15 MMOLE/5ML IV SOLN
15.0000 mmol | Freq: Once | INTRAVENOUS | Status: AC
  Administered 2023-04-02: 15 mmol via INTRAVENOUS
  Filled 2023-04-02: qty 5

## 2023-04-02 MED ORDER — METOPROLOL TARTRATE 12.5 MG HALF TABLET
12.5000 mg | ORAL_TABLET | Freq: Two times a day (BID) | ORAL | Status: DC
  Administered 2023-04-02 – 2023-04-04 (×5): 12.5 mg via ORAL
  Filled 2023-04-02 (×5): qty 1

## 2023-04-02 MED ORDER — MELATONIN 5 MG PO TABS
10.0000 mg | ORAL_TABLET | Freq: Every evening | ORAL | Status: DC | PRN
  Administered 2023-04-02 – 2023-04-04 (×2): 10 mg via ORAL
  Filled 2023-04-02 (×3): qty 2

## 2023-04-02 MED ORDER — HEPARIN BOLUS VIA INFUSION
1500.0000 [IU] | Freq: Once | INTRAVENOUS | Status: AC
  Administered 2023-04-02: 1500 [IU] via INTRAVENOUS
  Filled 2023-04-02: qty 1500

## 2023-04-02 MED ORDER — HYDROMORPHONE HCL 1 MG/ML IJ SOLN: 1.0000 mg | INTRAMUSCULAR | Status: DC | PRN

## 2023-04-02 NOTE — Progress Notes (Signed)
ANTICOAGULATION CONSULT NOTE  Pharmacy Consult for Heparin Indication: atrial fibrillation  No Known Allergies  Patient Measurements: Height: 5\' 8"  (172.7 cm) Weight: 77.1 kg (170 lb) IBW/kg (Calculated) : 68.4 Heparin Dosing Weight: 74.4 kg  Vital Signs: Temp: 99.1 F (37.3 C) (07/24 0736) Temp Source: Oral (07/24 0736) BP: 110/72 (07/24 0736) Pulse Rate: 76 (07/24 0736)  Labs: Recent Labs    04/01/23 0955 04/01/23 2211 04/02/23 0056 04/02/23 0325 04/02/23 0657  HGB 14.2  --  12.1*  --   --   HCT 42.8  --  35.3*  --   --   PLT 189  --  167  --   --   APTT  --  40*  --   --   --   LABPROT  --  16.1*  --   --   --   INR  --  1.3*  --   --   --   HEPARINUNFRC  --   --   --   --  <0.10*  CREATININE 1.46* 1.53* 1.38*  --   --   CKTOTAL  --  156  --   --   --   TROPONINIHS  --  1,376* 1,416* 1,227*  --     Estimated Creatinine Clearance: 44.1 mL/min (A) (by C-G formula based on SCr of 1.38 mg/dL (H)).   Medical History: Past Medical History:  Diagnosis Date   Anemia    SLIGHT ANEMIA 4 MONTHS AGO   Aortic valve stenosis 12/19/2017   Echo 07/30/16 - EF 55-60, mild aortic stenosis (mean 9) // Echo 4/19:  EF 60-65 mild AS (mean 11) // Echocardiogram 11/21: EF 60-65, no RWMA, mild LVH, Gr 1 DD, normal RVSF, trivial MR, calcified AV, mod AS (mean 20 mmHg, Vmax 280 cm/s, DI 0.27), trivial AI  // Echo 5/22: EF 60-65, moderate LVH, no RWMA, trivial MR, moderate AS (mean gradient 20 mmHg, V-max 287 cm/s, DI 0.35)    Arthritis    OA   Benign prostate hyperplasia    unspecified whether lower urinary tract sym. present    Carotid atherosclerosis    Carotid US 4/19:  bilat ICA 1-39; vertebrals and subclavians normal   Carotid atherosclerosis, bilateral 04/30/2018   1-39% on 12/2017 Care Everywhere Echo   Coronary artery calcification seen on CT scan 06/05/2018   Dysrhythmia    ATRIAL FIB   Elevated prostate specific antigen (PSA) 08/30/2016   Glucose intolerance     "pre-diabetic"   Hayfever    Heart murmur    History of exercise stress test    GXT 4/19:  ETT with good exercise tolerance (10:00); no chest pain; normal BP response; no diagnostic ST changes; negative adequate ETT.   HOH (hard of hearing)    BOTH EARS   Low back pain with sciatica 06/02/2012   Lumbar herniated disc 01/30/2018   Mild aortic stenosis 04/30/2018   12/2017 Care Everywhere Echo : Mean gradient (S): 11 mm Hg. Peak gradient (S): 24 mm Hg.   Numbness of right thumb    FROM CERVICAL NECK SURGERY   Osteoarthritis of right hip 09/21/2018   Overweight (BMI 25.0-29.9) 09/30/2018   Paroxysmal atrial fibrillation (HCC) 06/05/2018   Pre-diabetes    Prediabetes 04/30/2018   not sure about this   Prostatitis    unspecified prostatitis type   Pure hypercholesterolemia     Medications:  Scheduled:   metoprolol tartrate  12.5 mg Oral BID   montelukast  10 mg Oral  QHS   simvastatin  40 mg Oral Daily   Infusions:   sodium chloride 100 mL/hr at 04/02/23 0516   ceFEPime (MAXIPIME) IV 2 g (04/01/23 2236)   heparin 1,000 Units/hr (04/02/23 0516)   vancomycin 1,000 mg (04/01/23 2327)   PRN: acetaminophen **OR** acetaminophen, albuterol, HYDROcodone-acetaminophen, melatonin, ondansetron **OR** ondansetron (ZOFRAN) IV  Assessment: 77 yo male presents with afib of unknown duration. Pharmacy consulted to dose IV heparin. Not on anticoagulation PTA.   Heparin level came back undetectable, on heparin infusion at 1000 units/hr. Hgb 14>12, plt 167. Trop 2956>2130.  Goal of Therapy:  Heparin level 0.3-0.7 units/ml Monitor platelets by anticoagulation protocol: Yes   Plan:  Give 1000 units bolus x 1 Increase heparin infusion to 1200 units/hr Check anti-Xa level in 8 hours and daily while on heparin Continue to monitor H&H and platelets  Thank you for allowing pharmacy to participate in this patient's care,  Sherron Monday, PharmD, BCCCP Clinical Pharmacist  Phone:  [phone] 04/02/2023 9:44 AM  Please check AMION for all [facility] Pharm[facility] phone numbers After 10:00 PM, call Main Pharmacy [phone]

## 2023-04-02 NOTE — Progress Notes (Addendum)
Rounding Note    Patient Name: Cody Mcconnell Date of Encounter: 04/02/2023  [facility] Cardiologist: Tonny Bollman, MD   Subjective   Feeling very good.  Chest pain, shortness of breath, palpitations  Inpatient Medications    Scheduled Meds:  metoprolol tartrate  12.5 mg Oral Q6H   montelukast  10 mg Oral QHS   simvastatin  40 mg Oral Daily   Continuous Infusions:  sodium chloride 100 mL/hr at 04/02/23 0516   sodium chloride     ceFEPime (MAXIPIME) IV 2 g (04/01/23 2236)   heparin 1,000 Units/hr (04/02/23 0516)   potassium PHOSPHATE IVPB (in mmol) 15 mmol (04/02/23 0314)   vancomycin 1,000 mg (04/01/23 2327)   PRN Meds: acetaminophen **OR** acetaminophen, albuterol, HYDROcodone-acetaminophen, melatonin, ondansetron **OR** ondansetron (ZOFRAN) IV   Vital Signs    Vitals:   04/01/23 2033 04/02/23 0324 04/02/23 0500 04/02/23 0736  BP:  109/64 110/61 110/72  Pulse:  80 73 76  Resp: 20  (!) 21 16  Temp:   99.2 F (37.3 C) 99.1 F (37.3 C)  TempSrc:   Oral Oral  SpO2:   94% 99%  Weight:   77.1 kg   Height:        Intake/Output Summary (Last 24 hours) at 04/02/2023 0813 Last data filed at 04/02/2023 0516 Gross per 24 hour  Intake 1748.04 ml  Output 3356 ml  Net -1607.96 ml      04/02/2023    5:00 AM 04/01/2023    8:25 PM 04/01/2023    9:30 AM  Last 3 Weights  Weight (lbs) 170 lb 167 lb 12.8 oz 164 lb  Weight (kg) 77.111 kg 76.114 kg 74.39 kg      Telemetry    Normal sinus rhythm heart rate 70s- Personally Reviewed  ECG    Initial EKG showed atrial fibrillation, heart rate 168.  ST depressions Today's EKG shows normal sinus rhythm, heart rate 71.  No acute ST-T wave changes.  Resolution of ST depressions.  - Personally Reviewed  Physical Exam   GEN: No acute distress.   Neck: No JVD Cardiac: RRR, no murmurs, rubs, or gallops.  Respiratory: Clear to auscultation bilaterally. GI: Soft, nontender, non-distended  MS: Right leg  cellulitis  Neuro:  Nonfocal  Psych: Normal affect   Labs    High Sensitivity Troponin:   Recent Labs  Lab 04/01/23 2211 04/02/23 0056 04/02/23 0325  TROPONINIHS 1,376* 1,416* 1,227*     Chemistry Recent Labs  Lab 04/01/23 0955 04/01/23 2211 04/02/23 0056  NA 132* 133* 131*  K 3.2* 3.9 3.3*  CL 96* 96* 99  CO2 22 24 23   GLUCOSE 146* 146* 160*  BUN 21 21 20   CREATININE 1.46* 1.53* 1.38*  CALCIUM 9.0 8.4* 7.9*  MG  --  2.1 1.9  PROT 7.4  --  5.8*  ALBUMIN 3.9  --  2.6*  AST 37  --  28  ALT 19  --  20  ALKPHOS 61  --  57  BILITOT 1.2  --  0.9  GFRNONAA 50* 47* 53*  ANIONGAP 14 13 9     Lipids No results for input(s): "CHOL", "TRIG", "HDL", "LABVLDL", "LDLCALC", "CHOLHDL" in the last 168 hours.  Hematology Recent Labs  Lab 04/01/23 0955 04/02/23 0056  WBC 10.8* 7.9  RBC 5.12 4.30  HGB 14.2 12.1*  HCT 42.8 35.3*  MCV 83.6 82.1  MCH 27.7 28.1  MCHC 33.2 34.3  RDW 13.4 13.5  PLT 189 167  Thyroid  Recent Labs  Lab 04/01/23 2211  TSH 2.767  FREET4 1.54*    BNPNo results for input(s): "BNP", "PROBNP" in the last 168 hours.  DDimer  Recent Labs  Lab 04/01/23 2211  DDIMER 1.20*     Radiology    CT Angio Chest Pulmonary Embolism (PE) W or WO Contrast  Result Date: 04/02/2023 CLINICAL DATA:  Cardiac palpitations and difficulty breathing EXAM: CT ANGIOGRAPHY CHEST WITH CONTRAST TECHNIQUE: Multidetector CT imaging of the chest was performed using the standard protocol during bolus administration of intravenous contrast. Multiplanar CT image reconstructions and MIPs were obtained to evaluate the vascular anatomy. RADIATION DOSE REDUCTION: This exam was performed according to the departmental dose-optimization program which includes automated exposure control, adjustment of the mA and/or kV according to patient size and/or use of iterative reconstruction technique. CONTRAST:  75mL OMNIPAQUE IOHEXOL 350 MG/ML SOLN COMPARISON:  Plain film from earlier in the same  day. FINDINGS: Cardiovascular: Thoracic aorta shows normal branching pattern with atherosclerotic calcifications. No aneurysmal dilatation is seen. Heart is not significantly enlarged in size. The pulmonary artery shows a normal branching pattern bilaterally. No filling defect to suggest pulmonary embolism is noted. Coronary calcifications are noted. Mediastinum/Nodes: Thoracic inlet is within normal limits. No hilar or mediastinal adenopathy is noted. The esophagus as visualized is within normal limits. Lungs/Pleura: Lungs are well aerated bilaterally. No focal infiltrate or sizable effusion is seen. Upper Abdomen: Visualized upper abdomen is within normal limits. Musculoskeletal: No acute abnormality noted. Review of the MIP images confirms the above findings. IMPRESSION: No evidence of pulmonary emboli. No acute abnormality seen. Electronically Signed   By: Alcide Clever M.D.   On: 04/02/2023 00:22   US Venous Img Lower  Left (DVT Study)  Result Date: 04/01/2023 CLINICAL DATA:  Redness and swelling EXAM: Left LOWER EXTREMITY VENOUS DOPPLER ULTRASOUND TECHNIQUE: Gray-scale sonography with compression, as well as color and duplex ultrasound, were performed to evaluate the deep venous system(s) from the level of the common femoral vein through the popliteal and proximal calf veins. COMPARISON:  None Available. FINDINGS: VENOUS Normal compressibility of the common femoral, superficial femoral, and popliteal veins, as well as the visualized calf veins. Visualized portions of profunda femoral vein and great saphenous vein unremarkable. No filling defects to suggest DVT on grayscale or color Doppler imaging. Doppler waveforms show normal direction of venous flow, normal respiratory plasticity and response to augmentation. Limited views of the contralateral common femoral vein are unremarkable. OTHER None. Limitations: none IMPRESSION: No evidence of left lower extremity DVT Electronically Signed   By: Karen Kays  M.D.   On: 04/01/2023 11:15   DG Tibia/Fibula Left  Result Date: 04/01/2023 CLINICAL DATA:  Redness swelling ?DVT EXAM: LEFT TIBIA AND FIBULA - 2 VIEW COMPARISON:  None Available. FINDINGS: No acute fracture or dislocation. No aggressive osseous lesion. There are degenerative changes of the knee joint in the form of mildly reduced medial tibio-femoral compartment joint space, tibial spiking and tricompartmental osteophytosis. Ankle mortise appears intact. No focal soft tissue swelling. No radiopaque foreign bodies. IMPRESSION: *No acute osseous abnormality. *Mild degenerative changes of the left knee joint. Electronically Signed   By: Jules Schick M.D.   On: 04/01/2023 10:37   DG Chest Port 1 View  Result Date: 04/01/2023 CLINICAL DATA:  Palpitations EXAM: PORTABLE CHEST 1 VIEW COMPARISON:  CT scan chest from 11/26/2022. FINDINGS: Bilateral lung fields are clear. Bilateral costophrenic angles are clear. Normal cardio-mediastinal silhouette. No acute osseous abnormalities. Partially seen lower cervical  spinal fixation hardware. The soft tissues are within normal limits. IMPRESSION: No active disease. Electronically Signed   By: Jules Schick M.D.   On: 04/01/2023 10:35    Cardiac Studies   Echocardiogram 02/10/2023  1. Left ventricular ejection fraction, by estimation, is 55 to 60%. The  left ventricle has normal function. The left ventricle has no regional  wall motion abnormalities. Left ventricular diastolic parameters are  indeterminate.   2. Right ventricular systolic function is normal. The right ventricular  size is normal.   3. The mitral valve is normal in structure. No evidence of mitral valve  regurgitation. No evidence of mitral stenosis.   4. The aortic valve is calcified. Aortic valve regurgitation is mild.  Moderate to severe aortic valve stenosis. Aortic regurgitation PHT  measures 320 msec. Aortic valve area, by VTI measures 0.82 cm. Aortic  valve mean gradient measures 28.0  mmHg.  Aortic valve Vmax measures 3.38 m/s, DI 0.29, Indexed AVA 0.44 cm2/m2, SVI  32.   5. The inferior vena cava is normal in size with greater than 50%  respiratory variability, suggesting right atrial pressure of 3 mmHg.   Patient Profile     77 y.o. male moderate AS, HTN, HLD, paroxysmal AF.  Currently being evaluated for elevated troponins and cellulitis.  Patient reports recent travel to [place] in [place] and once he returned back on Saturday started developing systemic chills, lethargy, weakness, vomiting.  He does also report having bronchitis for the last several weeks while abroad.  Typically he is very active person.  He swims frequently and has had no decrease in functional status prior to this event.  In the emergency room patient was found to be in A-fib RVR with rates in the 140s to 160s with diffuse ST depressions.  Also reported to be slightly confused.  Patient subsequently converted back to normal sinus rhythm after starting diltiazem drip.  Previously, patient had cardiac monitor in 2021 that showed no evidence of atrial fibrillation so anticoagulation was deferred at that time. Assessment & Plan    NSTEMI Has not had any previous complaints of chest pain or decrease in exercise tolerance prior to these constitutional symptoms on Saturday.  EKG on arrival did show A-fib RVR with diffuse ST depressions that have now resolved.  Now maintaining normal sinus rhythm with resolved ST depressions.  Given the timing of this is, his preceding symptoms,  and the new onset of cellulitis I think is reasonable to have a high suspicion for sepsis and also elevation of troponins to be attributed to demand ischemia. I think it is reasonable to treat medically on IV heparin for now, evaluate echocardiogram for any wall motion abnormalities and wait for pending blood cultures before deciding on cardiac catheterization during this admission.  Also, may eventually need right and left heart  catheterization for further evaluation of his aortic stenosis however this seems to be overall stable.  Will discuss with MD Currently NPO.  Need to remove if catheterization plans for today. Previous echo showed normal EF   Paroxysmal atrial fibrillation with RVR Has not had any reported recurrence of atrial fibrillation in years, however patient asymptomatic.  He had a cardiac monitor in 2021 that showed no occurrences.  Given this anticoagulation has been deferred.  Patient's CHA2DS2-VASc 2 score is 3 indicating anticoagulation requirements.  Discussed with patient who is agreeable to this despite having issues with bruising previously.  Transition from heparin to Eliquis after decision for cardiac catheterization has been made.  Currently maintaining normal sinus rhythm now. Currently on Lopressor 12.5 mg every 6.  I will changes this to twice daily.  Will consolidate to Toprol-XL once baseline heart rate can be established and this is the appropriate dosing for him.  Asymptomatic moderate AS Recent echocardiogram shows mean gradient of 28.  Flow velocity 3.38.  See above.  Cellulitis High suspicion for sepsis given recent infection and constitutional symptoms.  Blood cultures pending.  IV antibiotics per primary team.  Hyperlipidemia Continue simvastatin 40 mg daily.  For questions or updates, please contact [facility] Please consult [email or web address] for contact info under   Signed, Abagail Kitchens, PA-C  04/02/2023, 8:13 AM     Patient seen and examined, note reviewed with the signed Advanced Practice Provider. I personally reviewed laboratory data, imaging studies and relevant notes. I independently examined the patient and formulated the important aspects of the plan. I have personally discussed the plan with the patient and/or family. Comments or changes to the note/plan are indicated below.  Clinical picture is consistent with type II MI in the setting of infection as well  as atrial fibrillation rapid ventricular rate.  He has no anginal symptoms.  Discussed with the patient.  Agree with heparin drip for now for medical management. His biggest issue is the infection from his cellulitis.  Antibiotics has been started by the primary team will defer to them for adjusting this. Continue his Lopressor as well as his anticoagulation. Echo pending, further recommendations if needed at that time.   Thomasene Ripple DO, MS [facility] Attending Cardiologist [facility]  [address] [place], [place] [address] [phone] Website: [email or web address]

## 2023-04-02 NOTE — Consult Note (Addendum)
Cardiology Consultation   Patient ID: Cody Mcconnell MRN: [id number]; DOB: 1945/10/08  Admit date: 04/01/2023 Date of Consult: 04/02/2023  PCP:  Linus Galas, NP   [facility] Providers Cardiologist:  Tonny Bollman, MD        Patient Profile:   Cody Mcconnell is a 77 y.o. male with moderate to severe AS, HTN, HLD, paroxysmal AF,  who is being seen 04/02/2023 for the evaluation of troponin elevation at the request of internal medicine.  History of Present Illness:   Cody Mcconnell was in his usual state of health until approximately 3 days prior to admission when he reports feeling systemic chills with associated fatigue and weakness.  He states that he lied around for most of the day Saturday and Sunday, continuing to feel weak and drained and unable to move much. He thought he was having a flare up of his Meniere's disease.  He called his wife to come [place] because he felt too weak to drive. He continued to feel weak until he saw his rheumatologist for routine follow-up.  During that visit, he was noted to have erythema and warmth of his left lower extremity concerning for cellulitis and was sent to the emergency room for further evaluation.    In the emergency room, he was found to be afebrile and normotensive, but with a heart rate in the 140s to 160s.  EKG showed atrial fibrillation with rapid ventricular rates with diffuse ST depression.  CBC showed white count 10.8, otherwise normal.  CMP showed sodium 132, K3.2, glucose 146, serum creatinine 1.46.  Lactic acid of 1.6. Initial troponin was elevated at 1376 --> 1416. Ultrasound of left lower extremity showed no DVT.  X-ray of the left lower extremity showed no acute osseous abnormality.  Patient was started on a diltiazem drip and antibiotics after blood cultures were drawn.  He subsequently converted to sinus rhythm.  At the time of my interview, the patient states that he is  comfortable and denies any actove chest pain, exertional chest pressure/discomfort, dyspnea/tachypnea, paroxysmal nocturnal dyspnea/orthopnea, irregular heart beat/palpitations, presyncope/syncope, lower extremity edema, claudication, or abdominal distention.   He reports that during this acute episode he never experienced chest pain and while he has shortness of breath, he describes it as more chronic exertional dyspnea over the past several weeks as he has been recovering from bronchitis. He does notice when he is in atrial fibrillation. He has never experienced significant chest pain before in the past and is normally quite active doing things around his multiple homes and swimming several times per week.  He does note an episode of atrial fibrillation approximately 45 years ago for which she was started on rivaroxaban and subsequently discontinued due to bleeding issues.     Past Medical History:  Diagnosis Date   Anemia    SLIGHT ANEMIA 4 MONTHS AGO   Aortic valve stenosis 12/19/2017   Echo 07/30/16 - EF 55-60, mild aortic stenosis (mean 9) // Echo 4/19:  EF 60-65 mild AS (mean 11) // Echocardiogram 11/21: EF 60-65, no RWMA, mild LVH, Gr 1 DD, normal RVSF, trivial MR, calcified AV, mod AS (mean 20 mmHg, Vmax 280 cm/s, DI 0.27), trivial AI  // Echo 5/22: EF 60-65, moderate LVH, no RWMA, trivial MR, moderate AS (mean gradient 20 mmHg, V-max 287 cm/s, DI 0.35)    Arthritis    OA   Benign prostate hyperplasia    unspecified whether lower urinary  tract sym. present    Carotid atherosclerosis    Carotid US 4/19:  bilat ICA 1-39; vertebrals and subclavians normal   Carotid atherosclerosis, bilateral 04/30/2018   1-39% on 12/2017 Care Everywhere Echo   Coronary artery calcification seen on CT scan 06/05/2018   Dysrhythmia    ATRIAL FIB   Elevated prostate specific antigen (PSA) 08/30/2016   Glucose intolerance    "pre-diabetic"   Hayfever    Heart murmur    History of exercise stress test     GXT 4/19:  ETT with good exercise tolerance (10:00); no chest pain; normal BP response; no diagnostic ST changes; negative adequate ETT.   HOH (hard of hearing)    BOTH EARS   Low back pain with sciatica 06/02/2012   Lumbar herniated disc 01/30/2018   Mild aortic stenosis 04/30/2018   12/2017 Care Everywhere Echo : Mean gradient (S): 11 mm Hg. Peak gradient (S): 24 mm Hg.   Numbness of right thumb    FROM CERVICAL NECK SURGERY   Osteoarthritis of right hip 09/21/2018   Overweight (BMI 25.0-29.9) 09/30/2018   Paroxysmal atrial fibrillation (HCC) 06/05/2018   Pre-diabetes    Prediabetes 04/30/2018   not sure about this   Prostatitis    unspecified prostatitis type   Pure hypercholesterolemia     Past Surgical History:  Procedure Laterality Date   ANTERIOR LAT LUMBAR FUSION Right 06/29/2020   Procedure: Right Lumbar Two-Three Anterolateral lumbar interbody fusion with lateral plate;  Surgeon: Maeola Harman, MD;  Location: [facility] OR;  Service: Neurosurgery;  Laterality: Right;  Right Lumbar Two-Three Anterolateral lumbar interbody fusion with lateral plate   CERVICAL SPINE SURGERY  2011   COLONOSCOPY     HIP SURGERY Right    arthroscopy   LUMBAR DISC SURGERY  2014   L2-3, L3 TO L4 X 2 2019 AT [facility]   LUMBAR FUSION  2019   psoas muscle repair (groin muscle?)  2022   SHOULDER ARTHROSCOPY Right    TOOTH EXTRACTION     TOTAL HIP ARTHROPLASTY Right 09/29/2018   Procedure: TOTAL HIP ARTHROPLASTY ANTERIOR APPROACH;  Surgeon: Durene Romans, MD;  Location: [facility] ORS;  Service: Orthopedics;  Laterality: Right;  71     Home Medications:  Prior to Admission medications   Medication Sig Start Date End Date Taking? Authorizing Provider  cetirizine (ZYRTEC) 10 MG tablet Take 10 mg by mouth daily.   Yes [provider]  diclofenac Sodium (VOLTAREN) 1 % GEL Apply 1 Application topically 2 (two) times daily as needed (joint pain).   Yes [provider]  fluticasone (FLONASE) 50  MCG/ACT nasal spray Place 1 spray into both nostrils daily as needed for allergies or rhinitis.   Yes [provider]  methotrexate (RHEUMATREX) 7.5 MG tablet Take 7.5 mg by mouth once a week. Caution" Chemotherapy. Protect from light.   Yes [provider]  montelukast (SINGULAIR) 10 MG tablet Take 10 mg by mouth at bedtime.   Yes [provider]  naproxen sodium (ALEVE) 220 MG tablet Take 440 mg by mouth daily.   Yes [provider]  sildenafil (VIAGRA) 100 MG tablet Take 100 mg by mouth daily as needed for erectile dysfunction.   Yes [provider]  simvastatin (ZOCOR) 40 MG tablet Take 40 mg by mouth daily.   Yes [provider]  triamterene-hydrochlorothiazide (DYAZIDE) 37.5-25 MG capsule Take 1 capsule by mouth every morning. 07/05/21  Yes [provider]  albuterol (VENTOLIN HFA) 108 (90 Base)  MCG/ACT inhaler Inhale 2 puffs into the lungs every 4 (four) hours as needed for wheezing. Patient not taking: Reported on 04/01/2023 03/17/23 04/16/23  [provider]    Inpatient Medications: Scheduled Meds:  metoprolol tartrate  12.5 mg Oral Q6H   montelukast  10 mg Oral QHS   simvastatin  40 mg Oral Daily   Continuous Infusions:  sodium chloride 100 mL/hr at 04/02/23 0000   sodium chloride     ceFEPime (MAXIPIME) IV 2 g (04/01/23 2236)   heparin 1,000 Units/hr (04/02/23 0000)   potassium PHOSPHATE IVPB (in mmol)     vancomycin 1,000 mg (04/01/23 2327)   PRN Meds: acetaminophen **OR** acetaminophen, albuterol, HYDROcodone-acetaminophen, ondansetron **OR** ondansetron (ZOFRAN) IV  Allergies:   No Known Allergies  Social History:   Social History   Socioeconomic History   Marital status: Married    Spouse name: Not on file   Number of children: 2   Years of education: Not on file   Highest education level: Not on file  Occupational History   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   Occupation: [employer]  Term Care Facilities    Comment: Owns several  Tobacco Use   Smoking status: Former    Current packs/day: 0.00    Average packs/day: 2.0 packs/day for 9.0 years (18.0 ttl pk-yrs)    Types: Cigarettes    Start date: 1969    Quit date: 1978    Years since quitting: 46.5   Smokeless tobacco: Never   Tobacco comments:    QUIT   Vaping Use   Vaping status: Never Used  Substance and Sexual Activity   Alcohol use: Never   Drug use: Never   Sexual activity: Not on file  Other Topics Concern   Not on file  Social History Narrative   Previously in [place], [place] - now in [place]   Brother is Dr. Patrica Duel (retired) from [place], [place]   Social Determinants of Health   Financial Resource Strain: Low Risk  (12/24/2021)   Received from [facility]   Overall Financial Resource Strain (CARDIA)  Food Insecurity: No Food Insecurity (04/02/2023)   Hunger Vital Sign    Worried About Running Out of Food in the Last Year: Never true    Ran Out of Food in the Last Year: Never true  Transportation Needs: No Transportation Needs (04/02/2023)   PRAPARE - Administrator, Civil Service (Medical): No    Lack of Transportation (Non-Medical): No  Physical Activity: Sufficiently Active (04/02/2023)   Exercise Vital Sign    Days of Exercise per Week: 5 days    Minutes of Exercise per Session: 30 min  Stress: No Stress Concern Present (04/02/2023)   [employer] of Occupational Health - Occupational Stress Questionnaire    Feeling of Stress : Only a little  Social Connections: Moderately Isolated (04/02/2023)   Social Connection and Isolation Panel [NHANES]    Frequency of Communication with Friends and Family: Twice a week    Frequency of Social Gatherings with Friends and Family: Twice a week    Attends Religious Services: Never    Database administrator or Organizations: No    Attends Banker Meetings: Never    Marital Status: Married  Catering manager  Violence: Not At Risk (04/02/2023)   Humiliation, Afraid, Rape, and Kick questionnaire    Fear of Current or Ex-Partner: No    Emotionally Abused: No    Physically  Abused: No    Sexually Abused: No    Family History:    Family History  Problem Relation Age of Onset   Dementia Mother    Congestive Heart Failure Father    CAD Father        quad bypass at age 45   CAD Brother 57     ROS:  Please see the history of present illness.   All other ROS reviewed and negative.     Physical Exam/Data:   Vitals:   04/01/23 1900 04/01/23 1930 04/01/23 2025 04/01/23 2033  BP: 118/69 114/67 116/69   Pulse: 95 95 79   Resp: (!) 24 (!) 25 (!) 23 20  Temp:   98.3 F (36.8 C)   TempSrc:   Oral   SpO2: 99% 96% 98%   Weight:   76.1 kg   Height:        Intake/Output Summary (Last 24 hours) at 04/02/2023 0205 Last data filed at 04/02/2023 0039 Gross per 24 hour  Intake 1145.12 ml  Output 2756 ml  Net -1610.88 ml      04/01/2023    8:25 PM 04/01/2023    9:30 AM 02/10/2023   11:23 AM  Last 3 Weights  Weight (lbs) 167 lb 12.8 oz 164 lb 172 lb 12.8 oz  Weight (kg) 76.114 kg 74.39 kg 78.382 kg     Body mass index is 25.51 kg/m.  General:  Well nourished, well developed, in no acute distress HEENT: normal Neck: no JVD Cardiac:  normal S1, S2; RRR; III/VI systolic murmur best appreciated at the right upper sternal border, no rubs or gallops appreciated Lungs:  clear to auscultation bilaterally, some expiratory wheezing best appreciated at the left lower back, no rhonchi or rales  Abd: soft, nontender, nondistended  Ext: no edema Skin: Left lower extremity with diffuse erythema along the shin and several superficial lacerations, no purulent drainage noted Neuro:  No focal abnormalities noted Psych:  Normal affect    Relevant CV Studies: TTE (03/09/23)  1. Left ventricular ejection fraction, by estimation, is 55 to 60%. The  left ventricle has normal function. The left ventricle has no  regional  wall motion abnormalities. Left ventricular diastolic parameters are  indeterminate.   2. Right ventricular systolic function is normal. The right ventricular  size is normal.   3. The mitral valve is normal in structure. No evidence of mitral valve  regurgitation. No evidence of mitral stenosis.   4. The aortic valve is calcified. Aortic valve regurgitation is mild.  Moderate to severe aortic valve stenosis. Aortic regurgitation PHT  measures 320 msec. Aortic valve area, by VTI measures 0.82 cm. Aortic  valve mean gradient measures 28.0 mmHg.  Aortic valve Vmax measures 3.38 m/s, DI 0.29, Indexed AVA 0.44 cm2/m2, SVI  32.   5. The inferior vena cava is normal in size with greater than 50%  respiratory variability, suggesting right atrial pressure of 3 mmHg.   Laboratory Data:  High Sensitivity Troponin:   Recent Labs  Lab 04/01/23 2211 04/02/23 0056  TROPONINIHS 1,376* 1,416*     Chemistry Recent Labs  Lab 04/01/23 0955 04/01/23 2211 04/02/23 0056  NA 132* 133* 131*  K 3.2* 3.9 3.3*  CL 96* 96* 99  CO2 22 24 23   GLUCOSE 146* 146* 160*  BUN 21 21 20   CREATININE 1.46* 1.53* 1.38*  CALCIUM 9.0 8.4* 7.9*  MG  --  2.1 1.9  GFRNONAA 50* 47* 53*  ANIONGAP 14 13 9  Recent Labs  Lab 04/01/23 0955 04/02/23 0056  PROT 7.4 5.8*  ALBUMIN 3.9 2.6*  AST 37 28  ALT 19 20  ALKPHOS 61 57  BILITOT 1.2 0.9   Lipids No results for input(s): "CHOL", "TRIG", "HDL", "LABVLDL", "LDLCALC", "CHOLHDL" in the last 168 hours.  Hematology Recent Labs  Lab 04/01/23 0955 04/02/23 0056  WBC 10.8* 7.9  RBC 5.12 4.30  HGB 14.2 12.1*  HCT 42.8 35.3*  MCV 83.6 82.1  MCH 27.7 28.1  MCHC 33.2 34.3  RDW 13.4 13.5  PLT 189 167   Thyroid  Recent Labs  Lab 04/01/23 2211  TSH 2.767  FREET4 1.54*    BNPNo results for input(s): "BNP", "PROBNP" in the last 168 hours.  DDimer  Recent Labs  Lab 04/01/23 2211  DDIMER 1.20*     Radiology/Studies:  CT Angio Chest  Pulmonary Embolism (PE) W or WO Contrast  Result Date: 04/02/2023 CLINICAL DATA:  Cardiac palpitations and difficulty breathing EXAM: CT ANGIOGRAPHY CHEST WITH CONTRAST TECHNIQUE: Multidetector CT imaging of the chest was performed using the standard protocol during bolus administration of intravenous contrast. Multiplanar CT image reconstructions and MIPs were obtained to evaluate the vascular anatomy. RADIATION DOSE REDUCTION: This exam was performed according to the departmental dose-optimization program which includes automated exposure control, adjustment of the mA and/or kV according to patient size and/or use of iterative reconstruction technique. CONTRAST:  75mL OMNIPAQUE IOHEXOL 350 MG/ML SOLN COMPARISON:  Plain film from earlier in the same day. FINDINGS: Cardiovascular: Thoracic aorta shows normal branching pattern with atherosclerotic calcifications. No aneurysmal dilatation is seen. Heart is not significantly enlarged in size. The pulmonary artery shows a normal branching pattern bilaterally. No filling defect to suggest pulmonary embolism is noted. Coronary calcifications are noted. Mediastinum/Nodes: Thoracic inlet is within normal limits. No hilar or mediastinal adenopathy is noted. The esophagus as visualized is within normal limits. Lungs/Pleura: Lungs are well aerated bilaterally. No focal infiltrate or sizable effusion is seen. Upper Abdomen: Visualized upper abdomen is within normal limits. Musculoskeletal: No acute abnormality noted. Review of the MIP images confirms the above findings. IMPRESSION: No evidence of pulmonary emboli. No acute abnormality seen. Electronically Signed   By: Alcide Clever M.D.   On: 04/02/2023 00:22   US Venous Img Lower  Left (DVT Study)  Result Date: 04/01/2023 CLINICAL DATA:  Redness and swelling EXAM: Left LOWER EXTREMITY VENOUS DOPPLER ULTRASOUND TECHNIQUE: Gray-scale sonography with compression, as well as color and duplex ultrasound, were performed to  evaluate the deep venous system(s) from the level of the common femoral vein through the popliteal and proximal calf veins. COMPARISON:  None Available. FINDINGS: VENOUS Normal compressibility of the common femoral, superficial femoral, and popliteal veins, as well as the visualized calf veins. Visualized portions of profunda femoral vein and great saphenous vein unremarkable. No filling defects to suggest DVT on grayscale or color Doppler imaging. Doppler waveforms show normal direction of venous flow, normal respiratory plasticity and response to augmentation. Limited views of the contralateral common femoral vein are unremarkable. OTHER None. Limitations: none IMPRESSION: No evidence of left lower extremity DVT Electronically Signed   By: Karen Kays M.D.   On: 04/01/2023 11:15   DG Tibia/Fibula Left  Result Date: 04/01/2023 CLINICAL DATA:  Redness swelling ?DVT EXAM: LEFT TIBIA AND FIBULA - 2 VIEW COMPARISON:  None Available. FINDINGS: No acute fracture or dislocation. No aggressive osseous lesion. There are degenerative changes of the knee joint in the form of mildly reduced medial tibio-femoral compartment  joint space, tibial spiking and tricompartmental osteophytosis. Ankle mortise appears intact. No focal soft tissue swelling. No radiopaque foreign bodies. IMPRESSION: *No acute osseous abnormality. *Mild degenerative changes of the left knee joint. Electronically Signed   By: Jules Schick M.D.   On: 04/01/2023 10:37   DG Chest Port 1 View  Result Date: 04/01/2023 CLINICAL DATA:  Palpitations EXAM: PORTABLE CHEST 1 VIEW COMPARISON:  CT scan chest from 11/26/2022. FINDINGS: Bilateral lung fields are clear. Bilateral costophrenic angles are clear. Normal cardio-mediastinal silhouette. No acute osseous abnormalities. Partially seen lower cervical spinal fixation hardware. The soft tissues are within normal limits. IMPRESSION: No active disease. Electronically Signed   By: Jules Schick M.D.   On:  04/01/2023 10:35     Assessment and Plan:   Cody Mcconnell is a 77 y.o. male with moderate to severe AS, HTN, HLD, paroxysmal AF,  who is being seen 04/02/2023 for the evaluation of troponin elevation at the request of internal medicine.  # Cellulitis # AF with RVR # Asymptomatic Moderate to severe AS # Type II NSTEMI # HTN # HLD  :: Suspect that troponin elevation is secondary to supply/demand mismatch in the setting of atrial fibrillation with RVR and moderate to severe aortic stenosis.  Possible contribution of infection to supply/demand mismatch as well.  Patient otherwise not endorsing chest pain now or previously.   - Continue anticoagulation with heparin gtt. for now - Would keep him NPO after midnight until evaluated by day team  - May benefit from [facility] either this admission or in near future for valve intervention planning - Would favor discontinuing diltiazem IV (due to its negative inotropic properties) and starting fractionated metoprolol for rate control - Would order limited echo to evaluate for regional wall motion abnormalities - Treatment of cellulitis per primary team   Risk Assessment/Risk Scores:          CHA2DS2-VASc Score =     This indicates a  % annual risk of stroke. The patient's score is based upon:       For questions or updates, please contact [facility] Please consult [email or web address] for contact info under    Signed, Ralene Ok, MD  04/02/2023 2:05 AM

## 2023-04-02 NOTE — Plan of Care (Signed)

## 2023-04-02 NOTE — Progress Notes (Addendum)
ANTICOAGULATION CONSULT NOTE  Pharmacy Consult for Heparin Indication: atrial fibrillation  No Known Allergies  Patient Measurements: Height: 5\' 8"  (172.7 cm) Weight: 77.1 kg (170 lb) IBW/kg (Calculated) : 68.4 Heparin Dosing Weight: 74.4 kg  Vital Signs: Temp: 98.8 F (37.1 C) (07/24 1903) Temp Source: Oral (07/24 1903) BP: 117/83 (07/24 1903) Pulse Rate: 90 (07/24 1903)  Labs: Recent Labs    04/01/23 0955 04/01/23 2211 04/02/23 0056 04/02/23 0325 04/02/23 0657 04/02/23 1909  HGB 14.2  --  12.1*  --   --   --   HCT 42.8  --  35.3*  --   --   --   PLT 189  --  167  --   --   --   APTT  --  40*  --   --   --   --   LABPROT  --  16.1*  --   --   --   --   INR  --  1.3*  --   --   --   --   HEPARINUNFRC  --   --   --   --  <0.10* <0.10*  CREATININE 1.46* 1.53* 1.38*  --   --   --   CKTOTAL  --  156  --   --   --   --   TROPONINIHS  --  1,376* 1,416* 1,227*  --   --     Estimated Creatinine Clearance: 44.1 mL/min (A) (by C-G formula based on SCr of 1.38 mg/dL (H)).   Medical History: Past Medical History:  Diagnosis Date   Anemia    SLIGHT ANEMIA 4 MONTHS AGO   Aortic valve stenosis 12/19/2017   Echo 07/30/16 - EF 55-60, mild aortic stenosis (mean 9) // Echo 4/19:  EF 60-65 mild AS (mean 11) // Echocardiogram 11/21: EF 60-65, no RWMA, mild LVH, Gr 1 DD, normal RVSF, trivial MR, calcified AV, mod AS (mean 20 mmHg, Vmax 280 cm/s, DI 0.27), trivial AI  // Echo 5/22: EF 60-65, moderate LVH, no RWMA, trivial MR, moderate AS (mean gradient 20 mmHg, V-max 287 cm/s, DI 0.35)    Arthritis    OA   Benign prostate hyperplasia    unspecified whether lower urinary tract sym. present    Carotid atherosclerosis    Carotid US 4/19:  bilat ICA 1-39; vertebrals and subclavians normal   Carotid atherosclerosis, bilateral 04/30/2018   1-39% on 12/2017 Care Everywhere Echo   Coronary artery calcification seen on CT scan 06/05/2018   Dysrhythmia    ATRIAL FIB   Elevated prostate  specific antigen (PSA) 08/30/2016   Glucose intolerance    "pre-diabetic"   Hayfever    Heart murmur    History of exercise stress test    GXT 4/19:  ETT with good exercise tolerance (10:00); no chest pain; normal BP response; no diagnostic ST changes; negative adequate ETT.   HOH (hard of hearing)    BOTH EARS   Low back pain with sciatica 06/02/2012   Lumbar herniated disc 01/30/2018   Mild aortic stenosis 04/30/2018   12/2017 Care Everywhere Echo : Mean gradient (S): 11 mm Hg. Peak gradient (S): 24 mm Hg.   Numbness of right thumb    FROM CERVICAL NECK SURGERY   Osteoarthritis of right hip 09/21/2018   Overweight (BMI 25.0-29.9) 09/30/2018   Paroxysmal atrial fibrillation (HCC) 06/05/2018   Pre-diabetes    Prediabetes 04/30/2018   not sure about this   Prostatitis  unspecified prostatitis type   Pure hypercholesterolemia     Medications:  Scheduled:   metoprolol tartrate  12.5 mg Oral BID   montelukast  10 mg Oral QHS   simvastatin  40 mg Oral Daily   Infusions:   sodium chloride 100 mL/hr at 04/02/23 0516   ceFEPime (MAXIPIME) IV 2 g (04/02/23 1017)   heparin 1,200 Units/hr (04/02/23 1655)   vancomycin 1,000 mg (04/01/23 2327)   PRN: acetaminophen **OR** acetaminophen, albuterol, HYDROcodone-acetaminophen, melatonin, ondansetron **OR** ondansetron (ZOFRAN) IV  Assessment: 77 yo male presents with afib of unknown duration. Pharmacy consulted to dose IV heparin. Not on anticoagulation PTA.   Heparin level remains undetectable, no bleeding noted, infusion was apparently paused earlier in the day but timing and duration is unknown.  Goal of Therapy:  Heparin level 0.3-0.7 units/ml Monitor platelets by anticoagulation protocol: Yes   Plan:  Heparin 1500 unit bolus x1 Increase heparin infusion to 1450 units/h Recheck heparin level in 8h  Fredonia Highland, PharmD, [place], [facility] Clinical Pharmacist [phone] Please check AMION for all [facility]  numbers 04/02/2023

## 2023-04-02 NOTE — Progress Notes (Addendum)
PROGRESS NOTE    Cody Mcconnell  ONG:[id number] DOB: Jan 25, 1946 DOA: 04/01/2023 PCP: Linus Galas, NP    Brief Narrative:  Cody Mcconnell is a 77 y.o. male with past medical history of atrial fibrillation, CVA, history of aortic stenosis, hyperlipidemia, coronary artery disease, hypertension and hyperlipidemia, rheumatoid arthritis presented to hospital with swelling and pain of the left leg with shortness of breath.  He also noticed redness of the leg, decreased appetite and fatigue.  In the ED, patient was noted to have atrial fibrillation with RVR with a heart rate of 168 and was started on Cardizem drip was given 1 dose of IV Rocephin.   Venous duplex ultrasound did not show any DVT.  X-ray of the left lower extremity without any infection.  History of rheumatoid arthritis but not restarted methotrexate.  Was on prednisone 2 weeks ago.   His heart rate improved with Cardizem drip and was considered for admission to the hospital for further evaluation and treatment.   Assessment and plan.  Cellulitis of the Left lower extremity.  On vancomycin and cefepime. Patient still has significant cellulitis.  Doppler ultrasound was negative for DVT.  Follow-up blood cultures-pending. . Atrial fibrillation with RVR,  history of moderate aortic stenosis,  history of paroxysmal atrial fibrillation. CHA2D-VASC score  4, on heparin drip.  TSH of 2.7. 2D echocardiogram with LV ejection fraction of 55 to 60% with no regional wall motion abnormality.  Elevated troponin likely secondary to demand ischemia.  No chest pain.  Patient was initially on Cardizem drip which has been discontinued by cardiology and patient has been changed to Lopressor 12.5 mg every 12 hourly.  Elevated troponin with diffuse ST depression likely non-ST elevation MI..  EKG changes have resolved.  No chest pain.  On heparin drip, cardiology on board.  Reviewed 2D echocardiogram.  CTA of the chest was negative for pulmonary  embolism.  On metoprolol at this time. Follow cardiology recommendations regarding possible need for heart catheterization..    Rheumatoid arthritis  Continue to hold methotrexate.  Recently completed prednisone as outpatient.  No active issues.   Aortic valve stenosis Cardiology on board.  No chest pain.   Bronchitis Chest x-ray showed no evidence of pneumonia.  CTA of the chest negative for infiltrate.  COVID screen was negative.  Will discontinue airborne precautions.    Hyperlipidemia Continue Zocor    AKI (acute kidney injury) Mild.  Creatinine from today at 1.3.  Improved from 1.5 yesterday.  Will continue to monitor BMP.  Received IV fluids with normal saline at 100 ml/h   Hypokalemia  Potassium today at 3.3.    Received 1 dose of 40 mEq of potassium today.  Potassium phosphate infusing.  Check BMP in AM.  Hypophosphatemia.  Phosphorus level 1.6.  Getting potassium phosphate infusion.  Check levels in AM.   DVT prophylaxis: SCDs Start: 04/01/23 2152   Code Status:     Code Status: Full Code  Disposition: Home likely in 1 to 2 days when okay with cardiology.  Status is: Observation  The patient will require care spanning > 2 midnights and should be moved to inpatient because: cellulitis, IV antibiotic, electrolyte imbalances, cardiac evaluation   Family Communication:  I tried to reach the patient's spouse listed as to contact multiple times but was unable to reach her.  Consultants:  Cardiology  Procedures:  None  Antimicrobials:  Vancomycin and cefepime IV  Anti-infectives (From admission, onward)    Start  Dose/Rate Route Frequency Ordered Stop   04/01/23 2300  vancomycin (VANCOCIN) IVPB 1000 mg/200 mL premix        1,000 mg 200 mL/hr over 60 Minutes Intravenous Every 24 hours 04/01/23 2211     04/01/23 2300  ceFEPIme (MAXIPIME) 2 g in sodium chloride 0.9 % 100 mL IVPB        2 g 200 mL/hr over 30 Minutes Intravenous Every 12 hours 04/01/23 2211      04/01/23 2245  vancomycin (VANCOCIN) IVPB 1000 mg/200 mL premix  Status:  Discontinued        1,000 mg 200 mL/hr over 60 Minutes Intravenous  Once 04/01/23 2153 04/01/23 2211   04/01/23 2245  ceFEPIme (MAXIPIME) 2 g in sodium chloride 0.9 % 100 mL IVPB  Status:  Discontinued        2 g 200 mL/hr over 30 Minutes Intravenous  Once 04/01/23 2153 04/01/23 2213   04/01/23 1000  cefTRIAXone (ROCEPHIN) 1 g in sodium chloride 0.9 % 100 mL IVPB        1 g 200 mL/hr over 30 Minutes Intravenous  Once 04/01/23 0958 04/01/23 1104       Subjective: Today, patient was seen and examined at bedside.  Patient denies any chest pain, shortness of breath, dyspnea, dizziness, lightheadedness or palpitation.  Objective: Vitals:   04/01/23 2033 04/02/23 0324 04/02/23 0500 04/02/23 0736  BP:  109/64 110/61 110/72  Pulse:  80 73 76  Resp: 20  (!) 21 16  Temp:   99.2 F (37.3 C) 99.1 F (37.3 C)  TempSrc:   Oral Oral  SpO2:   94% 99%  Weight:   77.1 kg   Height:        Intake/Output Summary (Last 24 hours) at 04/02/2023 1245 Last data filed at 04/02/2023 0516 Gross per 24 hour  Intake 1148.04 ml  Output 3356 ml  Net -2207.96 ml   Filed Weights   04/01/23 0930 04/01/23 2025 04/02/23 0500  Weight: 74.4 kg 76.1 kg 77.1 kg    Physical Examination: Body mass index is 25.85 kg/m.   General:  Average built, not in obvious distress HENT:   No scleral pallor or icterus noted. Oral mucosa is moist.  Chest:  Clear breath sounds.   No crackles or wheezes.  CVS: S1 &S2 heard. No murmur.  Regular rhythm Abdomen: Soft, nontender, nondistended.  Bowel sounds are heard.   Extremities: No cyanosis, clubbing, right leg with the edema erythema and tenderness on palpation..  Peripheral pulses are palpable. Psych: Alert, awake and oriented, normal mood CNS:  No cranial nerve deficits.  Power equal in all extremities.   Skin: Warm and dry.  Right lower extremity with edema erythema and tenderness.  Data  Reviewed:   CBC: Recent Labs  Lab 04/01/23 0955 04/02/23 0056  WBC 10.8* 7.9  NEUTROABS 9.0*  --   HGB 14.2 12.1*  HCT 42.8 35.3*  MCV 83.6 82.1  PLT 189 167    Basic Metabolic Panel: Recent Labs  Lab 04/01/23 0955 04/01/23 2211 04/02/23 0056  NA 132* 133* 131*  K 3.2* 3.9 3.3*  CL 96* 96* 99  CO2 22 24 23   GLUCOSE 146* 146* 160*  BUN 21 21 20   CREATININE 1.46* 1.53* 1.38*  CALCIUM 9.0 8.4* 7.9*  MG  --  2.1 1.9  PHOS  --  1.4* 1.6*    Liver Function Tests: Recent Labs  Lab 04/01/23 0955 04/02/23 0056  AST 37 28  ALT 19 20  ALKPHOS 61 57  BILITOT 1.2 0.9  PROT 7.4 5.8*  ALBUMIN 3.9 2.6*     Radiology Studies: ECHOCARDIOGRAM COMPLETE  Result Date: 04/02/2023    ECHOCARDIOGRAM REPORT   Patient Name:   SHAIDEN ALDOUS Date of Exam: 04/02/2023 Medical Rec #:  [id number]          Height:       68.0 in Accession #:    [id number]         Weight:       170.0 lb Date of Birth:  11-21-1945          BSA:          1.907 m Patient Age:    77 years           BP:           110/72 mmHg Patient Gender: M                  HR:           86 bpm. Exam Location:  Inpatient Procedure: 2D Echo, Color Doppler and Cardiac Doppler Indications:    NSTEMI  History:        Patient has prior history of Echocardiogram examinations, most                 recent 02/10/2023. Hypokalemia, NSTEMI, AKI, Aortic Valve Disease,                 Arrythmias:Atrial Fibrillation; Risk Factors:Dyslipidemia.  Sonographer:    Milbert Coulter Referring Phys: Therisa Doyne IMPRESSIONS  1. Left ventricular ejection fraction, by estimation, is 55 to 60%. The left ventricle has normal function. The left ventricle has no regional wall motion abnormalities. There is mild left ventricular hypertrophy. Left ventricular diastolic parameters were normal.  2. Right ventricular systolic function is normal. The right ventricular size is normal.  3. Left atrial size was mildly dilated.  4. The mitral valve is normal in structure.  Mild mitral valve regurgitation. No evidence of mitral stenosis.  5. The aortic valve is calcified. There is severe calcifcation of the aortic valve. There is severe thickening of the aortic valve. Aortic valve regurgitation is trivial. Moderate to severe aortic valve stenosis. Aortic valve area, by VTI measures 0.80 cm. Aortic valve mean gradient measures 31.2 mmHg. Aortic valve Vmax measures 3.54 m/s.  6. The inferior vena cava is normal in size with greater than 50% respiratory variability, suggesting right atrial pressure of 3 mmHg. Comparison(s): No significant change from prior study. FINDINGS  Left Ventricle: Left ventricular ejection fraction, by estimation, is 55 to 60%. The left ventricle has normal function. The left ventricle has no regional wall motion abnormalities. The left ventricular internal cavity size was normal in size. There is  mild left ventricular hypertrophy. Left ventricular diastolic parameters were normal. Right Ventricle: The right ventricular size is normal. No increase in right ventricular wall thickness. Right ventricular systolic function is normal. Left Atrium: Left atrial size was mildly dilated. Right Atrium: Right atrial size was normal in size. Pericardium: There is no evidence of pericardial effusion. Mitral Valve: The mitral valve is normal in structure. Mild mitral valve regurgitation. No evidence of mitral valve stenosis. Tricuspid Valve: The tricuspid valve is normal in structure. Tricuspid valve regurgitation is trivial. No evidence of tricuspid stenosis. Aortic Valve: The aortic valve is calcified. There is severe calcifcation of the aortic valve. There is severe thickening of the aortic valve. Aortic valve regurgitation is trivial. Moderate  to severe aortic stenosis is present. Aortic valve mean gradient measures 31.2 mmHg. Aortic valve peak gradient measures 50.2 mmHg. Aortic valve area, by VTI measures 0.80 cm. Pulmonic Valve: The pulmonic valve was not well  visualized. Pulmonic valve regurgitation is not visualized. No evidence of pulmonic stenosis. Aorta: The aortic root and ascending aorta are structurally normal, with no evidence of dilitation. Venous: The inferior vena cava is normal in size with greater than 50% respiratory variability, suggesting right atrial pressure of 3 mmHg. IAS/Shunts: The atrial septum is grossly normal.  LEFT VENTRICLE PLAX 2D LVIDd:         4.90 cm   Diastology LVIDs:         3.30 cm   LV e' medial:    9.57 cm/s LV PW:         1.20 cm   LV E/e' medial:  9.4 LV IVS:        1.20 cm   LV e' lateral:   12.50 cm/s LVOT diam:     1.90 cm   LV E/e' lateral: 7.2 LV SV:         60 LV SV Index:   32 LVOT Area:     2.84 cm  RIGHT VENTRICLE RV S prime:     15.20 cm/s TAPSE (M-mode): 2.0 cm LEFT ATRIUM             Index        RIGHT ATRIUM           Index LA diam:        4.60 cm 2.41 cm/m   RA Area:     13.10 cm LA Vol (A2C):   40.5 ml 21.23 ml/m  RA Volume:   29.20 ml  15.31 ml/m LA Vol (A4C):   59.5 ml 31.20 ml/m LA Biplane Vol: 51.1 ml 26.79 ml/m  AORTIC VALVE AV Area (Vmax):    0.87 cm AV Area (Vmean):   0.82 cm AV Area (VTI):     0.80 cm AV Vmax:           354.20 cm/s AV Vmean:          265.200 cm/s AV VTI:            0.754 m AV Peak Grad:      50.2 mmHg AV Mean Grad:      31.2 mmHg LVOT Vmax:         109.00 cm/s LVOT Vmean:        76.500 cm/s LVOT VTI:          0.212 m LVOT/AV VTI ratio: 0.28  AORTA Ao Root diam: 3.10 cm Ao Asc diam:  3.10 cm MITRAL VALVE MV Area (PHT): 4.17 cm    SHUNTS MV Decel Time: 182 msec    Systemic VTI:  0.21 m MV E velocity: 90.10 cm/s  Systemic Diam: 1.90 cm MV A velocity: 82.80 cm/s MV E/A ratio:  1.09 Jodelle Red MD Electronically signed by Jodelle Red MD Signature Date/Time: 04/02/2023/12:02:21 PM    Final    CT Angio Chest Pulmonary Embolism (PE) W or WO Contrast  Result Date: 04/02/2023 CLINICAL DATA:  Cardiac palpitations and difficulty breathing EXAM: CT ANGIOGRAPHY CHEST WITH  CONTRAST TECHNIQUE: Multidetector CT imaging of the chest was performed using the standard protocol during bolus administration of intravenous contrast. Multiplanar CT image reconstructions and MIPs were obtained to evaluate the vascular anatomy. RADIATION DOSE REDUCTION: This exam was performed according to the departmental dose-optimization program  which includes automated exposure control, adjustment of the mA and/or kV according to patient size and/or use of iterative reconstruction technique. CONTRAST:  75mL OMNIPAQUE IOHEXOL 350 MG/ML SOLN COMPARISON:  Plain film from earlier in the same day. FINDINGS: Cardiovascular: Thoracic aorta shows normal branching pattern with atherosclerotic calcifications. No aneurysmal dilatation is seen. Heart is not significantly enlarged in size. The pulmonary artery shows a normal branching pattern bilaterally. No filling defect to suggest pulmonary embolism is noted. Coronary calcifications are noted. Mediastinum/Nodes: Thoracic inlet is within normal limits. No hilar or mediastinal adenopathy is noted. The esophagus as visualized is within normal limits. Lungs/Pleura: Lungs are well aerated bilaterally. No focal infiltrate or sizable effusion is seen. Upper Abdomen: Visualized upper abdomen is within normal limits. Musculoskeletal: No acute abnormality noted. Review of the MIP images confirms the above findings. IMPRESSION: No evidence of pulmonary emboli. No acute abnormality seen. Electronically Signed   By: Alcide Clever M.D.   On: 04/02/2023 00:22   US Venous Img Lower  Left (DVT Study)  Result Date: 04/01/2023 CLINICAL DATA:  Redness and swelling EXAM: Left LOWER EXTREMITY VENOUS DOPPLER ULTRASOUND TECHNIQUE: Gray-scale sonography with compression, as well as color and duplex ultrasound, were performed to evaluate the deep venous system(s) from the level of the common femoral vein through the popliteal and proximal calf veins. COMPARISON:  None Available. FINDINGS:  VENOUS Normal compressibility of the common femoral, superficial femoral, and popliteal veins, as well as the visualized calf veins. Visualized portions of profunda femoral vein and great saphenous vein unremarkable. No filling defects to suggest DVT on grayscale or color Doppler imaging. Doppler waveforms show normal direction of venous flow, normal respiratory plasticity and response to augmentation. Limited views of the contralateral common femoral vein are unremarkable. OTHER None. Limitations: none IMPRESSION: No evidence of left lower extremity DVT Electronically Signed   By: Karen Kays M.D.   On: 04/01/2023 11:15   DG Tibia/Fibula Left  Result Date: 04/01/2023 CLINICAL DATA:  Redness swelling ?DVT EXAM: LEFT TIBIA AND FIBULA - 2 VIEW COMPARISON:  None Available. FINDINGS: No acute fracture or dislocation. No aggressive osseous lesion. There are degenerative changes of the knee joint in the form of mildly reduced medial tibio-femoral compartment joint space, tibial spiking and tricompartmental osteophytosis. Ankle mortise appears intact. No focal soft tissue swelling. No radiopaque foreign bodies. IMPRESSION: *No acute osseous abnormality. *Mild degenerative changes of the left knee joint. Electronically Signed   By: Jules Schick M.D.   On: 04/01/2023 10:37   DG Chest Port 1 View  Result Date: 04/01/2023 CLINICAL DATA:  Palpitations EXAM: PORTABLE CHEST 1 VIEW COMPARISON:  CT scan chest from 11/26/2022. FINDINGS: Bilateral lung fields are clear. Bilateral costophrenic angles are clear. Normal cardio-mediastinal silhouette. No acute osseous abnormalities. Partially seen lower cervical spinal fixation hardware. The soft tissues are within normal limits. IMPRESSION: No active disease. Electronically Signed   By: Jules Schick M.D.   On: 04/01/2023 10:35      LOS: 0 days    Joycelyn Das, MD [facility] Available via Epic secure chat 7am-7pm After these hours, please refer to coverage  provider listed on [email or web address] 04/02/2023, 12:45 PM

## 2023-04-02 NOTE — Progress Notes (Signed)
patient 's LLE is  now very painful vs no pain yesterday. Pulses are palpable and extremity is very warm to touch and more swollen than the night before.

## 2023-04-02 NOTE — Progress Notes (Signed)
Troponin level is 1376. Doutova MD notified.

## 2023-04-03 ENCOUNTER — Other Ambulatory Visit (HOSPITAL_COMMUNITY): Payer: Self-pay

## 2023-04-03 DIAGNOSIS — I2489 Other forms of acute ischemic heart disease: Secondary | ICD-10-CM | POA: Diagnosis not present

## 2023-04-03 DIAGNOSIS — I4891 Unspecified atrial fibrillation: Secondary | ICD-10-CM | POA: Diagnosis not present

## 2023-04-03 DIAGNOSIS — N179 Acute kidney failure, unspecified: Secondary | ICD-10-CM | POA: Diagnosis not present

## 2023-04-03 DIAGNOSIS — I350 Nonrheumatic aortic (valve) stenosis: Secondary | ICD-10-CM | POA: Diagnosis not present

## 2023-04-03 DIAGNOSIS — L03116 Cellulitis of left lower limb: Secondary | ICD-10-CM | POA: Diagnosis not present

## 2023-04-03 LAB — CBC
HCT: 32.8 % — ABNORMAL LOW (ref 39.0–52.0)
Hemoglobin: 10.9 g/dL — ABNORMAL LOW (ref 13.0–17.0)
MCH: 27.5 pg (ref 26.0–34.0)
MCHC: 33.2 g/dL (ref 30.0–36.0)
MCV: 82.8 fL (ref 80.0–100.0)
Platelets: 171 10*3/uL (ref 150–400)
RBC: 3.96 MIL/uL — ABNORMAL LOW (ref 4.22–5.81)
RDW: 13.5 % (ref 11.5–15.5)
WBC: 6 10*3/uL (ref 4.0–10.5)
nRBC: 0 % (ref 0.0–0.2)

## 2023-04-03 LAB — BASIC METABOLIC PANEL
Anion gap: 9 (ref 5–15)
BUN: 12 mg/dL (ref 8–23)
CO2: 21 mmol/L — ABNORMAL LOW (ref 22–32)
Calcium: 8.1 mg/dL — ABNORMAL LOW (ref 8.9–10.3)
Chloride: 107 mmol/L (ref 98–111)
Creatinine, Ser: 0.92 mg/dL (ref 0.61–1.24)
GFR, Estimated: >60 mL/min (ref >60)
Glucose, Bld: 120 mg/dL — ABNORMAL HIGH (ref 70–99)
Potassium: 3.7 mmol/L (ref 3.5–5.1)
Sodium: 137 mmol/L (ref 135–145)

## 2023-04-03 LAB — CULTURE, BLOOD (ROUTINE X 2)
Culture: NO GROWTH
Special Requests: ADEQUATE
Special Requests: ADEQUATE

## 2023-04-03 MED ORDER — VANCOMYCIN HCL 1500 MG/300ML IV SOLN
1500.0000 mg | INTRAVENOUS | Status: DC
  Administered 2023-04-04: 1500 mg via INTRAVENOUS
  Filled 2023-04-03: qty 300

## 2023-04-03 MED ORDER — SODIUM CHLORIDE 0.9 % IV SOLN
2.0000 g | Freq: Three times a day (TID) | INTRAVENOUS | Status: DC
  Administered 2023-04-03 – 2023-04-04 (×3): 2 g via INTRAVENOUS
  Filled 2023-04-03 (×3): qty 12.5

## 2023-04-03 MED ORDER — APIXABAN 5 MG PO TABS: 5.0000 mg | ORAL_TABLET | Freq: Two times a day (BID) | ORAL | Status: DC

## 2023-04-03 MED ORDER — RIVAROXABAN 20 MG PO TABS
20.0000 mg | ORAL_TABLET | Freq: Every day | ORAL | Status: DC
  Administered 2023-04-03: 20 mg via ORAL
  Filled 2023-04-03: qty 1

## 2023-04-03 MED ORDER — HEPARIN BOLUS VIA INFUSION
1500.0000 [IU] | Freq: Once | INTRAVENOUS | Status: AC
  Administered 2023-04-03: 1500 [IU] via INTRAVENOUS
  Filled 2023-04-03: qty 1500

## 2023-04-03 NOTE — Discharge Instructions (Signed)
Information on my medicine - XARELTO® (Rivaroxaban) ° °Why was Xarelto® prescribed for you? °Xarelto® was prescribed for you to reduce the risk of a blood clot forming that can cause a stroke if you have a medical condition called atrial fibrillation (a type of irregular heartbeat). ° °What do you need to know about xarelto® ? °Take your Xarelto® ONCE DAILY at the same time every day with your evening meal. °If you have difficulty swallowing the tablet whole, you may crush it and mix in applesauce just prior to taking your dose. ° °Take Xarelto® exactly as prescribed by your doctor and DO NOT stop taking Xarelto® without talking to the doctor who prescribed the medication.  Stopping without other stroke prevention medication to take the place of Xarelto® may increase your risk of developing a clot that causes a stroke.  Refill your prescription before you run out. ° °After discharge, you should have regular check-up appointments with your healthcare provider that is prescribing your Xarelto®.  In the future your dose may need to be changed if your kidney function or weight changes by a significant amount. ° °What do you do if you miss a dose? °If you are taking Xarelto® ONCE DAILY and you miss a dose, take it as soon as you remember on the same day then continue your regularly scheduled once daily regimen the next day. Do not take two doses of Xarelto® at the same time or on the same day.  ° °Important Safety Information °A possible side effect of Xarelto® is bleeding. You should call your healthcare provider right away if you experience any of the following: °? Bleeding from an injury or your nose that does not stop. °? Unusual colored urine (red or dark brown) or unusual colored stools (red or black). °? Unusual bruising for unknown reasons. °? A serious fall or if you hit your head (even if there is no bleeding). ° °Some medicines may interact with Xarelto® and might increase your risk of bleeding while on  Xarelto®. To help avoid this, consult your healthcare provider or pharmacist prior to using any new prescription or non-prescription medications, including herbals, vitamins, non-steroidal anti-inflammatory drugs (NSAIDs) and supplements. ° °This website has more information on Xarelto®: www.xarelto.com. ° °

## 2023-04-03 NOTE — Progress Notes (Signed)
Pharmacy Antibiotic Note  Cody Mcconnell is a 77 y.o. male admitted on 04/01/2023 with cellulitis.  Pharmacy has been consulted for vancomycin and cefepime dosing.  Plan: Adjust cefepime 2g IV to q8h Adjust vancomycin to 1500 mg IV q24h Consider vancomycin levels at steady state, goal AUC 400-550 Follow up renal function & cultures, clinical course   Height: 5\' 8"  (172.7 cm) Weight: 77.1 kg (170 lb) IBW/kg (Calculated) : 68.4  Temp (24hrs), Avg:98.2 F (36.8 C), Min:97.5 F (36.4 C), Max:98.8 F (37.1 C)  Recent Labs  Lab 04/01/23 0955 04/01/23 1155 04/01/23 2211 04/02/23 0056 04/03/23 0622  WBC 10.8*  --   --  7.9 6.0  CREATININE 1.46*  --  1.53* 1.38* 0.92  LATICACIDVEN 1.6 1.2  --   --   --     Estimated Creatinine Clearance: 66.1 mL/min (by C-G formula based on SCr of 0.92 mg/dL).    No Known Allergies  Antimicrobials this admission: 7/23 Vancomycin >> 7/23 Cefepime >>  Microbiology results: 7/23 BCx: ngtd 7/23 UCx: ng 7/23 MRSA PCR: neg  Thank you for allowing pharmacy to participate in this patient's care,  Sherron Monday, PharmD, BCCCP Clinical Pharmacist  Phone: [phone] 04/03/2023 11:47 AM  Please check AMION for all [facility] Pharmacy phone numbers After 10:00 PM, call Main Pharmacy [phone]

## 2023-04-03 NOTE — Progress Notes (Addendum)
ANTICOAGULATION CONSULT NOTE  Pharmacy Consult for Heparin Indication: atrial fibrillation  No Known Allergies  Patient Measurements: Height: 5\' 8"  (172.7 cm) Weight: 77.1 kg (170 lb) IBW/kg (Calculated) : 68.4 Heparin Dosing Weight: 74.4 kg  Vital Signs: Temp: 97.5 F (36.4 C) (07/25 0354) Temp Source: Oral (07/25 0354) BP: 123/69 (07/24 2042) Pulse Rate: 85 (07/24 2042)  Labs: Recent Labs    04/01/23 0955 04/01/23 2211 04/02/23 0056 04/02/23 0325 04/02/23 0657 04/02/23 1909 04/03/23 0622  HGB 14.2  --  12.1*  --   --   --  10.9*  HCT 42.8  --  35.3*  --   --   --  32.8*  PLT 189  --  167  --   --   --  171  APTT  --  40*  --   --   --   --   --   LABPROT  --  16.1*  --   --   --   --   --   INR  --  1.3*  --   --   --   --   --   HEPARINUNFRC  --   --   --   --  <0.10* <0.10* 0.14*  CREATININE 1.46* 1.53* 1.38*  --   --   --  0.92  CKTOTAL  --  156  --   --   --   --   --   TROPONINIHS  --  1,376* 1,416* 1,227*  --   --   --     Estimated Creatinine Clearance: 66.1 mL/min (by C-G formula based on SCr of 0.92 mg/dL).   Medical History: Past Medical History:  Diagnosis Date   Anemia    SLIGHT ANEMIA 4 MONTHS AGO   Aortic valve stenosis 12/19/2017   Echo 07/30/16 - EF 55-60, mild aortic stenosis (mean 9) // Echo 4/19:  EF 60-65 mild AS (mean 11) // Echocardiogram 11/21: EF 60-65, no RWMA, mild LVH, Gr 1 DD, normal RVSF, trivial MR, calcified AV, mod AS (mean 20 mmHg, Vmax 280 cm/s, DI 0.27), trivial AI  // Echo 5/22: EF 60-65, moderate LVH, no RWMA, trivial MR, moderate AS (mean gradient 20 mmHg, V-max 287 cm/s, DI 0.35)    Arthritis    OA   Benign prostate hyperplasia    unspecified whether lower urinary tract sym. present    Carotid atherosclerosis    Carotid US 4/19:  bilat ICA 1-39; vertebrals and subclavians normal   Carotid atherosclerosis, bilateral 04/30/2018   1-39% on 12/2017 Care Everywhere Echo   Coronary artery calcification seen on CT scan  06/05/2018   Dysrhythmia    ATRIAL FIB   Elevated prostate specific antigen (PSA) 08/30/2016   Glucose intolerance    "pre-diabetic"   Hayfever    Heart murmur    History of exercise stress test    GXT 4/19:  ETT with good exercise tolerance (10:00); no chest pain; normal BP response; no diagnostic ST changes; negative adequate ETT.   HOH (hard of hearing)    BOTH EARS   Low back pain with sciatica 06/02/2012   Lumbar herniated disc 01/30/2018   Mild aortic stenosis 04/30/2018   12/2017 Care Everywhere Echo : Mean gradient (S): 11 mm Hg. Peak gradient (S): 24 mm Hg.   Numbness of right thumb    FROM CERVICAL NECK SURGERY   Osteoarthritis of right hip 09/21/2018   Overweight (BMI 25.0-29.9) 09/30/2018   Paroxysmal atrial fibrillation (HCC) 06/05/2018  Pre-diabetes    Prediabetes 04/30/2018   not sure about this   Prostatitis    unspecified prostatitis type   Pure hypercholesterolemia     Medications:  Scheduled:   metoprolol tartrate  12.5 mg Oral BID   montelukast  10 mg Oral QHS   simvastatin  40 mg Oral Daily   Infusions:   sodium chloride 100 mL/hr at 04/03/23 0657   ceFEPime (MAXIPIME) IV 2 g (04/02/23 2358)   heparin 1,450 Units/hr (04/02/23 2041)   vancomycin 1,000 mg (04/03/23 0039)   PRN: acetaminophen **OR** acetaminophen, albuterol, HYDROcodone-acetaminophen, HYDROmorphone (DILAUDID) injection, melatonin, ondansetron **OR** ondansetron (ZOFRAN) IV  Assessment: 77 yo male presents with afib of unknown duration. Pharmacy consulted to dose IV heparin. Not on anticoagulation PTA.   Heparin level remains subtherapeutic at 0.14, on heparin infusion at 1450 units/hr. No s/sx of bleeding or infusion issues.   Goal of Therapy:  Heparin level 0.3-0.7 units/ml Monitor platelets by anticoagulation protocol: Yes   Plan:  Heparin 1500 unit bolus x1 Increase heparin infusion to 1700 units/h Recheck heparin level in 8h Monitor daily HL, CBC, and for s/sx of  bleeding  Thank you for allowing pharmacy to participate in this patient's care,  Sherron Monday, PharmD, BCCCP Clinical Pharmacist  Phone: [phone] 04/03/2023 2:13 PM  Please check AMION for all [facility] Pharmacy phone numbers After 10:00 PM, call Main Pharmacy [phone]   ADDENDUM No plans for cardiac cath. Will change from heparin infusion to Xarelto 20 mg. Monitor for s/sx of bleeding.   Thank you for allowing pharmacy to participate in this patient's care,  Sherron Monday, PharmD, BCCCP Clinical Pharmacist  Phone: [phone] 04/03/2023 2:14 PM  Please check AMION for all [facility] Pharmacy phone numbers After 10:00 PM, call Main Pharmacy [phone]

## 2023-04-03 NOTE — Progress Notes (Signed)
PROGRESS NOTE    Cody Mcconnell  UXL:[id number] DOB: [birth year] DOA: 04/01/2023 PCP: Linus Galas, NP    Brief Narrative:   Cody Mcconnell is a 77 y.o. male with past medical history of atrial fibrillation, CVA, history of aortic stenosis, hyperlipidemia, coronary artery disease, hypertension and hyperlipidemia, rheumatoid arthritis presented to the hospital with swelling and pain of the left leg with shortness of breath.  He also noticed redness of the leg, decreased appetite and fatigue.  In the ED, patient was noted to have atrial fibrillation with RVR with a heart rate of 168 and was started on Cardizem drip was given 1 dose of IV Rocephin.   Venous duplex ultrasound did not show any DVT.  X-ray of the left lower extremity without any infection.  History of rheumatoid arthritis but not restarted methotrexate.  Was on prednisone 2 weeks ago.   His heart rate improved with Cardizem drip and was considered for admission to the hospital for further evaluation and treatment.   Assessment and plan.  Cellulitis of the Left lower extremity.  On vancomycin and cefepime. Patient still has significant cellulitis, edema with slight improvement in redness..  Doppler ultrasound was negative for DVT.  Follow-up blood cultures- negative in 2 days. . Atrial fibrillation with RVR,  history of moderate aortic stenosis,  History of paroxysmal atrial fibrillation. Controlled at this time.  CHA2D-VASC score  4, on heparin drip.  TSH of 2.7. 2D echocardiogram with LV ejection fraction of 55 to 60% with no regional wall motion abnormality.  Elevated troponin likely secondary to demand ischemia.  No chest pain.  Patient was initially on Cardizem drip which has been discontinued by cardiology and patient has been changed to Lopressor 12.5 mg every 12 hourly.  Elevated troponin with diffuse ST depression likely non-ST elevation MI.  EKG changes have resolved.  No chest pain.  On heparin drip, cardiology on  board.  2D echocardiogram with normal LV function without regional wall motion abnormality.  CTA of the chest was negative for pulmonary embolism.  On metoprolol.  Cardiology recommends conservative treatment at this time and plan is to transition to Eliquis tonight.  Cardiac catheterization has been deferred at this time.  Continue simvastatin 40 mg daily.  Rheumatoid arthritis  Continue to hold methotrexate.  Recently completed prednisone as outpatient.  No active issues.   Aortic valve stenosis Cardiology on board.  No chest pain.   Bronchitis Chest x-ray showed no evidence of pneumonia.  CTA of the chest negative for infiltrate.  COVID screen was negative.     Hyperlipidemia Continue Zocor    AKI (acute kidney injury) Mild.  Creatinine from today at 0.9.  Improved from 1.5 initially   Hypokalemia Has improved after replacement.  Latest potassium of 3.7.    Hypophosphatemia.  Replenished. Check phosphate level in AM.   DVT prophylaxis:    Code Status:     Code Status: Full Code  Disposition: Home likely in 1 to 2 days when okay with cardiology.  Status is: inpatient  The patient is inpatient because: cellulitis, IV antibiotic, electrolyte imbalances, heparin drip   Family Communication:  Unable to reach the patient's spouse   Consultants:  Cardiology  Procedures:  None  Antimicrobials:  Vancomycin and cefepime IV  Anti-infectives (From admission, onward)    Start     Dose/Rate Route Frequency Ordered Stop   04/04/23 0100  vancomycin (VANCOREADY) IVPB 1500 mg/300 mL        1,500 mg 150  mL/hr over 120 Minutes Intravenous Every 24 hours 04/03/23 1146     04/03/23 1900  ceFEPIme (MAXIPIME) 2 g in sodium chloride 0.9 % 100 mL IVPB        2 g 200 mL/hr over 30 Minutes Intravenous Every 8 hours 04/03/23 1146     04/01/23 2300  vancomycin (VANCOCIN) IVPB 1000 mg/200 mL premix  Status:  Discontinued        1,000 mg 200 mL/hr over 60 Minutes Intravenous Every 24  hours 04/01/23 2211 04/03/23 1146   04/01/23 2300  ceFEPIme (MAXIPIME) 2 g in sodium chloride 0.9 % 100 mL IVPB  Status:  Discontinued        2 g 200 mL/hr over 30 Minutes Intravenous Every 12 hours 04/01/23 2211 04/03/23 1146   04/01/23 2245  vancomycin (VANCOCIN) IVPB 1000 mg/200 mL premix  Status:  Discontinued        1,000 mg 200 mL/hr over 60 Minutes Intravenous  Once 04/01/23 2153 04/01/23 2211   04/01/23 2245  ceFEPIme (MAXIPIME) 2 g in sodium chloride 0.9 % 100 mL IVPB  Status:  Discontinued        2 g 200 mL/hr over 30 Minutes Intravenous  Once 04/01/23 2153 04/01/23 2213   04/01/23 1000  cefTRIAXone (ROCEPHIN) 1 g in sodium chloride 0.9 % 100 mL IVPB        1 g 200 mL/hr over 30 Minutes Intravenous  Once 04/01/23 0958 04/01/23 1104       Subjective: Today, patient was seen and examined at bedside.  Still has pain and swelling of his left  lower extremity with some improvement in redness.  Denies any chest pain, dyspnea  Objective: Vitals:   04/02/23 1903 04/02/23 2042 04/03/23 0354 04/03/23 1012  BP: 117/83 123/69  128/69  Pulse: 90 85  82  Resp: 18  16 14   Temp: 98.8 F (37.1 C)  (!) 97.5 F (36.4 C) 98.2 F (36.8 C)  TempSrc: Oral  Oral Oral  SpO2: 99%   100%  Weight:      Height:        Intake/Output Summary (Last 24 hours) at 04/03/2023 1318 Last data filed at 04/03/2023 0358 Gross per 24 hour  Intake --  Output 600 ml  Net -600 ml   Filed Weights   04/01/23 0930 04/01/23 2025 04/02/23 0500  Weight: 74.4 kg 76.1 kg 77.1 kg    Physical Examination: Body mass index is 25.85 kg/m.   General:  Average built, not in obvious distress, Communicative HENT:   No scleral pallor or icterus noted. Oral mucosa is moist.  Chest:  Clear breath sounds.   No crackles or wheezes.  CVS: S1 &S2 heard. No murmur.  Regular rhythm Abdomen: Soft, nontender, nondistended.  Bowel sounds are heard.   Extremities: No cyanosis, clubbing, left lower extremity with edema  tenderness and decreasing erythema.   Psych: Alert, awake and oriented, normal mood CNS:  No cranial nerve deficits.  Power equal i 6 n all extremities.   Skin: Warm and dry.  Left lower extremity with edema erythema and tenderness.  Data Reviewed:   CBC: Recent Labs  Lab 04/01/23 0955 04/02/23 0056 04/03/23 0622  WBC 10.8* 7.9 6.0  NEUTROABS 9.0*  --   --   HGB 14.2 12.1* 10.9*  HCT 42.8 35.3* 32.8*  MCV 83.6 82.1 82.8  PLT 189 167 171    Basic Metabolic Panel: Recent Labs  Lab 04/01/23 0955 04/01/23 2211 04/02/23 0056 04/03/23 1610  NA 132* 133* 131* 137  K 3.2* 3.9 3.3* 3.7  CL 96* 96* 99 107  CO2 22 24 23  21*  GLUCOSE 146* 146* 160* 120*  BUN 21 21 20 12   CREATININE 1.46* 1.53* 1.38* 0.92  CALCIUM 9.0 8.4* 7.9* 8.1*  MG  --  2.1 1.9 2.0  PHOS  --  1.4* 1.6*  --     Liver Function Tests: Recent Labs  Lab 04/01/23 0955 04/02/23 0056  AST 37 28  ALT 19 20  ALKPHOS 61 57  BILITOT 1.2 0.9  PROT 7.4 5.8*  ALBUMIN 3.9 2.6*     Radiology Studies: ECHOCARDIOGRAM COMPLETE  Result Date: 04/02/2023    ECHOCARDIOGRAM REPORT   Patient Name:   Cody Mcconnell Date of Exam: 04/02/2023 Medical Rec #:  [id number]          Height:       68.0 in Accession #:    [id number]         Weight:       170.0 lb Date of Birth:  Nov 14, 1945          BSA:          1.907 m Patient Age:    77 years           BP:           110/72 mmHg Patient Gender: M                  HR:           86 bpm. Exam Location:  Inpatient Procedure: 2D Echo, Color Doppler and Cardiac Doppler Indications:    NSTEMI  History:        Patient has prior history of Echocardiogram examinations, most                 recent 02/10/2023. Hypokalemia, NSTEMI, AKI, Aortic Valve Disease,                 Arrythmias:Atrial Fibrillation; Risk Factors:Dyslipidemia.  Sonographer:    Milbert Coulter Referring Phys: Therisa Doyne IMPRESSIONS  1. Left ventricular ejection fraction, by estimation, is 55 to 60%. The left ventricle has  normal function. The left ventricle has no regional wall motion abnormalities. There is mild left ventricular hypertrophy. Left ventricular diastolic parameters were normal.  2. Right ventricular systolic function is normal. The right ventricular size is normal.  3. Left atrial size was mildly dilated.  4. The mitral valve is normal in structure. Mild mitral valve regurgitation. No evidence of mitral stenosis.  5. The aortic valve is calcified. There is severe calcifcation of the aortic valve. There is severe thickening of the aortic valve. Aortic valve regurgitation is trivial. Moderate to severe aortic valve stenosis. Aortic valve area, by VTI measures 0.80 cm. Aortic valve mean gradient measures 31.2 mmHg. Aortic valve Vmax measures 3.54 m/s.  6. The inferior vena cava is normal in size with greater than 50% respiratory variability, suggesting right atrial pressure of 3 mmHg. Comparison(s): No significant change from prior study. FINDINGS  Left Ventricle: Left ventricular ejection fraction, by estimation, is 55 to 60%. The left ventricle has normal function. The left ventricle has no regional wall motion abnormalities. The left ventricular internal cavity size was normal in size. There is  mild left ventricular hypertrophy. Left ventricular diastolic parameters were normal. Right Ventricle: The right ventricular size is normal. No increase in right ventricular wall thickness. Right ventricular systolic function is normal. Left Atrium: Left atrial  size was mildly dilated. Right Atrium: Right atrial size was normal in size. Pericardium: There is no evidence of pericardial effusion. Mitral Valve: The mitral valve is normal in structure. Mild mitral valve regurgitation. No evidence of mitral valve stenosis. Tricuspid Valve: The tricuspid valve is normal in structure. Tricuspid valve regurgitation is trivial. No evidence of tricuspid stenosis. Aortic Valve: The aortic valve is calcified. There is severe calcifcation  of the aortic valve. There is severe thickening of the aortic valve. Aortic valve regurgitation is trivial. Moderate to severe aortic stenosis is present. Aortic valve mean gradient measures 31.2 mmHg. Aortic valve peak gradient measures 50.2 mmHg. Aortic valve area, by VTI measures 0.80 cm. Pulmonic Valve: The pulmonic valve was not well visualized. Pulmonic valve regurgitation is not visualized. No evidence of pulmonic stenosis. Aorta: The aortic root and ascending aorta are structurally normal, with no evidence of dilitation. Venous: The inferior vena cava is normal in size with greater than 50% respiratory variability, suggesting right atrial pressure of 3 mmHg. IAS/Shunts: The atrial septum is grossly normal.  LEFT VENTRICLE PLAX 2D LVIDd:         4.90 cm   Diastology LVIDs:         3.30 cm   LV e' medial:    9.57 cm/s LV PW:         1.20 cm   LV E/e' medial:  9.4 LV IVS:        1.20 cm   LV e' lateral:   12.50 cm/s LVOT diam:     1.90 cm   LV E/e' lateral: 7.2 LV SV:         60 LV SV Index:   32 LVOT Area:     2.84 cm  RIGHT VENTRICLE RV S prime:     15.20 cm/s TAPSE (M-mode): 2.0 cm LEFT ATRIUM             Index        RIGHT ATRIUM           Index LA diam:        4.60 cm 2.41 cm/m   RA Area:     13.10 cm LA Vol (A2C):   40.5 ml 21.23 ml/m  RA Volume:   29.20 ml  15.31 ml/m LA Vol (A4C):   59.5 ml 31.20 ml/m LA Biplane Vol: 51.1 ml 26.79 ml/m  AORTIC VALVE AV Area (Vmax):    0.87 cm AV Area (Vmean):   0.82 cm AV Area (VTI):     0.80 cm AV Vmax:           354.20 cm/s AV Vmean:          265.200 cm/s AV VTI:            0.754 m AV Peak Grad:      50.2 mmHg AV Mean Grad:      31.2 mmHg LVOT Vmax:         109.00 cm/s LVOT Vmean:        76.500 cm/s LVOT VTI:          0.212 m LVOT/AV VTI ratio: 0.28  AORTA Ao Root diam: 3.10 cm Ao Asc diam:  3.10 cm MITRAL VALVE MV Area (PHT): 4.17 cm    SHUNTS MV Decel Time: 182 msec    Systemic VTI:  0.21 m MV E velocity: 90.10 cm/s  Systemic Diam: 1.90 cm MV A velocity:  82.80 cm/s MV E/A ratio:  1.09 Jodelle Red MD Electronically signed  by Jodelle Red MD Signature Date/Time: 04/02/2023/12:02:21 PM    Final    CT Angio Chest Pulmonary Embolism (PE) W or WO Contrast  Result Date: 04/02/2023 CLINICAL DATA:  Cardiac palpitations and difficulty breathing EXAM: CT ANGIOGRAPHY CHEST WITH CONTRAST TECHNIQUE: Multidetector CT imaging of the chest was performed using the standard protocol during bolus administration of intravenous contrast. Multiplanar CT image reconstructions and MIPs were obtained to evaluate the vascular anatomy. RADIATION DOSE REDUCTION: This exam was performed according to the departmental dose-optimization program which includes automated exposure control, adjustment of the mA and/or kV according to patient size and/or use of iterative reconstruction technique. CONTRAST:  75mL OMNIPAQUE IOHEXOL 350 MG/ML SOLN COMPARISON:  Plain film from earlier in the same day. FINDINGS: Cardiovascular: Thoracic aorta shows normal branching pattern with atherosclerotic calcifications. No aneurysmal dilatation is seen. Heart is not significantly enlarged in size. The pulmonary artery shows a normal branching pattern bilaterally. No filling defect to suggest pulmonary embolism is noted. Coronary calcifications are noted. Mediastinum/Nodes: Thoracic inlet is within normal limits. No hilar or mediastinal adenopathy is noted. The esophagus as visualized is within normal limits. Lungs/Pleura: Lungs are well aerated bilaterally. No focal infiltrate or sizable effusion is seen. Upper Abdomen: Visualized upper abdomen is within normal limits. Musculoskeletal: No acute abnormality noted. Review of the MIP images confirms the above findings. IMPRESSION: No evidence of pulmonary emboli. No acute abnormality seen. Electronically Signed   By: Alcide Clever M.D.   On: 04/02/2023 00:22      LOS: 1 day    Joycelyn Das, MD [facility] Available via Epic secure  chat 7am-7pm After these hours, please refer to coverage provider listed on [email or web address] 04/03/2023, 1:18 PM

## 2023-04-03 NOTE — TOC Benefit Eligibility Note (Addendum)
Pharmacy Patient Advocate Encounter  Insurance verification completed.    The patient is insured through [employer] Part D  Ran test claim for Eliquis 5 mg and the current 30 day co-pay is $142.66 due to a $500.00 deductible.  Ran test claim for Xarelto 20 mg and the current 30 day co-pay is $136.70 due to a $500.00 deductible.   This test claim was processed through [facility]- copay amounts may vary at other pharmacies due to pharmacy/plan contracts, or as the patient moves through the different stages of their insurance plan.    Roland Earl, CPHT Pharmacy Patient Advocate Specialist [facility] Patient Advocate Team Direct Number: [phone]  Fax: [phone]

## 2023-04-04 DIAGNOSIS — J40 Bronchitis, not specified as acute or chronic: Secondary | ICD-10-CM | POA: Diagnosis not present

## 2023-04-04 DIAGNOSIS — I350 Nonrheumatic aortic (valve) stenosis: Secondary | ICD-10-CM | POA: Diagnosis not present

## 2023-04-04 DIAGNOSIS — I4891 Unspecified atrial fibrillation: Secondary | ICD-10-CM | POA: Diagnosis not present

## 2023-04-04 DIAGNOSIS — I2489 Other forms of acute ischemic heart disease: Secondary | ICD-10-CM | POA: Diagnosis not present

## 2023-04-04 DIAGNOSIS — N179 Acute kidney failure, unspecified: Secondary | ICD-10-CM | POA: Diagnosis not present

## 2023-04-04 MED ORDER — CEPHALEXIN 500 MG PO CAPS: 500.0000 mg | ORAL_CAPSULE | Freq: Four times a day (QID) | ORAL | 0 refills | Status: DC

## 2023-04-04 MED ORDER — METOPROLOL TARTRATE 25 MG PO TABS: 12.5000 mg | ORAL_TABLET | Freq: Two times a day (BID) | ORAL | 2 refills | Status: DC

## 2023-04-04 MED ORDER — RIVAROXABAN 20 MG PO TABS: 20.0000 mg | ORAL_TABLET | Freq: Every day | ORAL | 0 refills | Status: DC

## 2023-04-04 MED ORDER — DOXYCYCLINE HYCLATE 100 MG PO TABS: 100.0000 mg | ORAL_TABLET | Freq: Two times a day (BID) | ORAL | 0 refills | Status: DC

## 2023-04-04 MED ORDER — ACETAMINOPHEN 325 MG PO TABS: 650.0000 mg | ORAL_TABLET | Freq: Four times a day (QID) | ORAL | Status: AC | PRN

## 2023-04-04 NOTE — Plan of Care (Signed)
Problem: Education: Goal: Knowledge of General Education information will improve Description: Including pain rating scale, medication(s)/side effects and non-pharmacologic comfort measures 04/04/2023 0946 by Herma Carson, RN Outcome: Adequate for Discharge 04/04/2023 0813 by Herma Carson, RN Outcome: Progressing   Problem: Health Behavior/Discharge Planning: Goal: Ability to manage health-related needs will improve 04/04/2023 0946 by Herma Carson, RN Outcome: Adequate for Discharge 04/04/2023 0813 by Herma Carson, RN Outcome: Progressing   Problem: Clinical Measurements: Goal: Ability to maintain clinical measurements within normal limits will improve 04/04/2023 0946 by Herma Carson, RN Outcome: Adequate for Discharge 04/04/2023 0813 by Herma Carson, RN Outcome: Progressing Goal: Will remain free from infection 04/04/2023 0946 by Herma Carson, RN Outcome: Adequate for Discharge 04/04/2023 0813 by Herma Carson, RN Outcome: Progressing Goal: Diagnostic test results will improve 04/04/2023 0946 by Herma Carson, RN Outcome: Adequate for Discharge 04/04/2023 0813 by Herma Carson, RN Outcome: Progressing Goal: Respiratory complications will improve 04/04/2023 0946 by Herma Carson, RN Outcome: Adequate for Discharge 04/04/2023 0813 by Herma Carson, RN Outcome: Progressing Goal: Cardiovascular complication will be avoided 04/04/2023 0946 by Herma Carson, RN Outcome: Adequate for Discharge 04/04/2023 0813 by Herma Carson, RN Outcome: Progressing   Problem: Activity: Goal: Risk for activity intolerance will decrease 04/04/2023 0946 by Herma Carson, RN Outcome: Adequate for Discharge 04/04/2023 0813 by Herma Carson, RN Outcome: Progressing   Problem: Nutrition: Goal: Adequate nutrition will be maintained 04/04/2023 0946 by Herma Carson, RN Outcome: Adequate for Discharge 04/04/2023 0813 by Herma Carson, RN Outcome: Progressing    Problem: Coping: Goal: Level of anxiety will decrease 04/04/2023 0946 by Herma Carson, RN Outcome: Adequate for Discharge 04/04/2023 0813 by Herma Carson, RN Outcome: Progressing   Problem: Elimination: Goal: Will not experience complications related to bowel motility 04/04/2023 0946 by Herma Carson, RN Outcome: Adequate for Discharge 04/04/2023 0813 by Herma Carson, RN Outcome: Progressing Goal: Will not experience complications related to urinary retention 04/04/2023 0946 by Herma Carson, RN Outcome: Adequate for Discharge 04/04/2023 0813 by Herma Carson, RN Outcome: Progressing   Problem: Pain Managment: Goal: General experience of comfort will improve 04/04/2023 0946 by Herma Carson, RN Outcome: Adequate for Discharge 04/04/2023 0813 by Herma Carson, RN Outcome: Progressing   Problem: Safety: Goal: Ability to remain free from injury will improve 04/04/2023 0946 by Herma Carson, RN Outcome: Adequate for Discharge 04/04/2023 0813 by Herma Carson, RN Outcome: Progressing   Problem: Skin Integrity: Goal: Risk for impaired skin integrity will decrease 04/04/2023 0946 by Herma Carson, RN Outcome: Adequate for Discharge 04/04/2023 0813 by Herma Carson, RN Outcome: Progressing   Problem: Clinical Measurements: Goal: Ability to avoid or minimize complications of infection will improve 04/04/2023 0946 by Herma Carson, RN Outcome: Adequate for Discharge 04/04/2023 0813 by Herma Carson, RN Outcome: Progressing   Problem: Skin Integrity: Goal: Skin integrity will improve 04/04/2023 0946 by Herma Carson, RN Outcome: Adequate for Discharge 04/04/2023 0813 by Herma Carson, RN Outcome: Progressing   Problem: Education: Goal: Knowledge of disease or condition will improve 04/04/2023 0946 by Herma Carson, RN Outcome: Adequate for Discharge 04/04/2023 0813 by Herma Carson, RN Outcome: Progressing Goal: Understanding of medication  regimen will improve 04/04/2023 0946 by Herma Carson, RN Outcome: Adequate for Discharge 04/04/2023 0813 by Herma Carson, RN Outcome: Progressing Goal: Individualized Educational Video(s) 04/04/2023 4401 by Edrick Oh,  Ree Kida, RN Outcome: Adequate for Discharge 04/04/2023 0813 by Herma Carson, RN Outcome: Progressing   Problem: Activity: Goal: Ability to tolerate increased activity will improve 04/04/2023 0946 by Herma Carson, RN Outcome: Adequate for Discharge 04/04/2023 0813 by Herma Carson, RN Outcome: Progressing   Problem: Cardiac: Goal: Ability to achieve and maintain adequate cardiopulmonary perfusion will improve 04/04/2023 0946 by Herma Carson, RN Outcome: Adequate for Discharge 04/04/2023 0813 by Herma Carson, RN Outcome: Progressing   Problem: Health Behavior/Discharge Planning: Goal: Ability to safely manage health-related needs after discharge will improve 04/04/2023 0946 by Herma Carson, RN Outcome: Adequate for Discharge 04/04/2023 0813 by Herma Carson, RN Outcome: Progressing

## 2023-04-04 NOTE — Plan of Care (Signed)
  Problem: Elimination: Goal: Will not experience complications related to bowel motility Outcome: Progressing   

## 2023-04-04 NOTE — Progress Notes (Signed)
PT Cancellation Note  Patient Details Name: Cody Mcconnell MRN: [id number] DOB: [birth year]   Cancelled Treatment:    Reason Eval/Treat Not Completed: Other (comment) Spoke with RN and pt ready to d/c and able to walk in room.  PT stopped to check and pt already discharged. Anise Salvo, PT Acute Rehab [facility] [phone]   Rayetta Humphrey 04/04/2023, 12:26 PM

## 2023-04-04 NOTE — Progress Notes (Addendum)
Patient Name: Cody Mcconnell Date of Encounter: 04/04/2023 [facility] Cardiologist: Tonny Bollman, MD   Interval Summary  .    Patient feeling well today. No chest pain, palpitations. Left leg continues to be red and warm to the touch, but is less swollen. Maintaining NSR   Vital Signs .    Vitals:   04/03/23 0354 04/03/23 1012 04/03/23 2040 04/04/23 0311  BP:  128/69 124/71 135/72  Pulse:  82 73 73  Resp: 16 14 17 15   Temp: (!) 97.5 F (36.4 C) 98.2 F (36.8 C) 98.7 F (37.1 C) 98.1 F (36.7 C)  TempSrc: Oral Oral Oral Oral  SpO2:  100% 97% 95%  Weight:      Height:        Intake/Output Summary (Last 24 hours) at 04/04/2023 0848 Last data filed at 04/04/2023 1610 Gross per 24 hour  Intake 400 ml  Output 1605 ml  Net -1205 ml      04/02/2023    5:00 AM 04/01/2023    8:25 PM 04/01/2023    9:30 AM  Last 3 Weights  Weight (lbs) 170 lb 167 lb 12.8 oz 164 lb  Weight (kg) 77.111 kg 76.114 kg 74.39 kg      Telemetry/ECG    NSR - Personally Reviewed  Physical Exam .   GEN: No acute distress.  Sitting upright in the bed  Neck: No JVD Cardiac: RRR. Grade 2/6 systolic murmur  Respiratory: Crackles in lung bases. Normal work of breathing on room air  GI: Soft, nontender, non-distended  MS: No edema in RLE. LLE is red, warm to the touch with 1+ edema   Assessment & Plan .     Demand Ischemia  - Patient presented complaining of swelling and pain in the left left, shortness of breath. Was found to have afib with RVR in the ED.  - hsTn 1376, 1416, 1227. Overall flat trend, not consistent with ACS. Suspect type II NSTEMI in the setting of infection, afib with RVR  - Echo this admission showed EF 55-60%, no regional wall motion abnormalities  - Patient was on IV heparin. Now back on xarelto for afib  - No plans for ischemic evaluation this admission given lack of chest pain, normal wall motion/EF on echo. Could consider coronary CTA as an outpatient- patient  needs a coronary CTA prior to aortic valve intervention anyway  - Continue simvastatin 40 mg daily, metoprolol tartrate 12.5 mg BID   Paroxysmal atrial fibrillation  - Patient presented with left swelling and pain and was found to be in afib with RVR.  - currently maintaining NSR - Continue metoprolol tartrate 12.5 mg BID - Continue xarleto 20 mg daily   Moderate-severe AS  - Echocardiogram from 02/10/23 showed moderate-severe AS with mean gradient 28 mmHg. Echo this admission showed moderate-severe AS with mean gradient 31.2 mmHg  - Patient is followed by Dr. Excell Seltzer as an outpatient- per his notes, likely patient will require aortic valve intervention in the next few years. Planning to have echocardiogram repeated in 02/2024   HLD - On simvastatin   Otherwise per primary  - Cellulitis of left lower extremity  - Rheumatoid arthritis  - Bronchitis - recent, continues to have some crackles in the lung bases but breathing is OK on room air. No orthopnea    For questions or updates, please contact [facility] Please consult [email or web address] for contact info under   Signed, Jonita Albee, PA-C   Patient seen  and examined, note reviewed with the signed Advanced Practice Provider. I personally reviewed laboratory data, imaging studies and relevant notes. I independently examined the patient and formulated the important aspects of the plan. I have personally discussed the plan with the patient and/or family. Comments or changes to the note/plan are indicated below.     No anginal symptoms we have had the patient on heparin drip for medical management.  At this point his troponin elevation is highly suspected to be type II in the setting of his infection.may benefit from outpatient CCTA.   Transitioned for heparin to xeralto for PAF.  His valvular disease appears to be asymptomatic.  Review his echocardiogram is normal no evidence of any wall motion abnormalities.  Discussed with  the patient.  No questions at this time about his plan of care.  He is in agreement. His antibiotics is being managed by the primary team.  Will sign off at this time.     Thomasene Ripple DO, MS [facility] Attending Cardiologist [facility]  [address] [place], [place] [address] [phone] Website: [email or web address]

## 2023-04-04 NOTE — Plan of Care (Signed)
  Problem: Education: Goal: Knowledge of General Education information will improve Description: Including pain rating scale, medication(s)/side effects and non-pharmacologic comfort measures Outcome: Progressing   Problem: Health Behavior/Discharge Planning: Goal: Ability to manage health-related needs will improve Outcome: Progressing   Problem: Clinical Measurements: Goal: Ability to maintain clinical measurements within normal limits will improve Outcome: Progressing Goal: Will remain free from infection Outcome: Progressing Goal: Diagnostic test results will improve Outcome: Progressing Goal: Respiratory complications will improve Outcome: Progressing Goal: Cardiovascular complication will be avoided Outcome: Progressing   Problem: Activity: Goal: Risk for activity intolerance will decrease Outcome: Progressing   Problem: Nutrition: Goal: Adequate nutrition will be maintained Outcome: Progressing   Problem: Coping: Goal: Level of anxiety will decrease Outcome: Progressing   Problem: Elimination: Goal: Will not experience complications related to bowel motility Outcome: Progressing Goal: Will not experience complications related to urinary retention Outcome: Progressing   Problem: Pain Managment: Goal: General experience of comfort will improve Outcome: Progressing   Problem: Safety: Goal: Ability to remain free from injury will improve Outcome: Progressing   Problem: Skin Integrity: Goal: Risk for impaired skin integrity will decrease Outcome: Progressing   Problem: Clinical Measurements: Goal: Ability to avoid or minimize complications of infection will improve Outcome: Progressing   Problem: Skin Integrity: Goal: Skin integrity will improve Outcome: Progressing   Problem: Education: Goal: Knowledge of disease or condition will improve Outcome: Progressing Goal: Understanding of medication regimen will improve Outcome: Progressing Goal:  Individualized Educational Video(s) Outcome: Progressing   Problem: Activity: Goal: Ability to tolerate increased activity will improve Outcome: Progressing   Problem: Cardiac: Goal: Ability to achieve and maintain adequate cardiopulmonary perfusion will improve Outcome: Progressing   Problem: Health Behavior/Discharge Planning: Goal: Ability to safely manage health-related needs after discharge will improve Outcome: Progressing

## 2023-04-04 NOTE — Discharge Summary (Signed)
Physician Discharge Summary  Cody Mcconnell UJW:[id number] DOB: October 26, 1945 DOA: 04/01/2023  PCP: Linus Galas, NP  Admit date: 04/01/2023 Discharge date: 04/04/2023  Admitted From: Home  Discharge disposition: Home  Recommendations for Outpatient Follow-Up:   Follow up with your primary care provider in one week.  Check CBC, BMP, magnesium in the next visit Follow-up with cardiology Dr. Excell Seltzer as scheduled by the clinic/in 2 weeks.  Discharge Diagnosis:   Principal Problem:   Atrial fibrillation with RVR (HCC) Active Problems:   Aortic valve stenosis   Cellulitis   Rheumatoid arthritis (HCC)   Bronchitis   Hyperlipidemia   AKI (acute kidney injury) (HCC)   Hypokalemia   NSTEMI (non-ST elevated myocardial infarction) (HCC)   Hypophosphatemia   Discharge Condition: Improved.  Diet recommendation: Low sodium, heart healthy.    Wound care: None.  Code status: Full.   History of Present Illness:   Cody Mcconnell is a 77 y.o. male with past medical history of atrial fibrillation, CVA, history of aortic stenosis, hyperlipidemia, coronary artery disease, hypertension and hyperlipidemia, rheumatoid arthritis presented to the hospital with swelling and pain of the left leg with shortness of breath.  He also noticed redness of the leg, decreased appetite and fatigue.  In the ED, patient was noted to have atrial fibrillation with RVR with a heart rate of 168 and was started on Cardizem drip was given 1 dose of IV Rocephin.   Venous duplex ultrasound did not show any DVT.  X-ray of the left lower extremity without any infection.  History of rheumatoid arthritis. Was on prednisone 2 weeks ago.   His heart rate improved with Cardizem drip and was considered for admission to the hospital for further evaluation and treatment.    Hospital Course:   Following conditions were addressed during hospitalization as listed below,  Cellulitis of the Left lower extremity.  During  hospitalization patient received  vancomycin and cefepime.  He did have 1 improvement in his cellulitis but will continue with Keflex and doxycycline for next 5 days on discharge.  He was advised elevation of the legs while at rest and follow-up with PCP.  No fever or leukocytosis.  Blood cultures have been negative in 3 days.   Doppler ultrasound was negative for DVT.  Patient does have extreme thinning of the skin due to steroids. . Atrial fibrillation with RVR,  history of moderate aortic stenosis,  History of paroxysmal atrial fibrillation. Controlled at this time.  CHA2D-VASC score  4, patient was initially on heparin drip which has been changed to Xarelto at this time.  Patient was advised precautions while on blood thinners..  TSH of 2.7. 2D echocardiogram with LV ejection fraction of 55 to 60% with no regional wall motion abnormality.  Patient was initially on Cardizem drip which was discontinued and has been changed to metoprolol 12.5 mg every 12 hourly.  Will need to follow-up with cardiology Dr. Excell Seltzer as outpatient.    Elevated troponin with diffuse ST depression likely non-ST elevation MI.  EKG changes have resolved.  No chest pain.  Patient was initially on heparin drip, cardiology followed the patient during hospitalization.  2D echocardiogram with normal LV function without regional wall motion abnormality.  CTA of the chest was negative for pulmonary embolism.  Cardiology recommended beta-blockers and conservative treatment.  Will need to follow-up with cardiology as outpatient since that he has a aortic valve stenosis as well.    Continue simvastatin 40 mg daily.   Rheumatoid arthritis  Continue to hold methotrexate.  Recently completed prednisone as outpatient.  No active issues.  Has extreme thinning of the skin due to steroids.   Aortic valve stenosis Cardiology followed the patient during hospitalization.  Will need to follow-up with Dr. Excell Seltzer as outpatient.   Bronchitis Chest  x-ray showed no evidence of pneumonia.  CTA of the chest negative for infiltrate.  COVID screen was negative.  Improved.    Hyperlipidemia Continue Zocor    AKI (acute kidney injury) Mild.  Creatinine from today at 0.9.  Improved from 1.5 initially   Hypokalemia Has improved after replacement.  Latest potassium of 3.5.     Hypophosphatemia.  Replenished.   Disposition.  At this time, patient is stable for disposition home with outpatient PCP and cardiology follow-up  Medical Consultants:   Cardiology  Procedures:    None Subjective:   Today, patient was seen and examined at bedside.  Shortness of breath, chest pain, dizziness, lightheadedness.  Still has some redness and swelling of his left lower extremity but without any fever or chills.  Discharge Exam:   Vitals:   04/03/23 2040 04/04/23 0311  BP: 124/71 135/72  Pulse: 73 73  Resp: 17 15  Temp: 98.7 F (37.1 C) 98.1 F (36.7 C)  SpO2: 97% 95%   Vitals:   04/03/23 0354 04/03/23 1012 04/03/23 2040 04/04/23 0311  BP:  128/69 124/71 135/72  Pulse:  82 73 73  Resp: 16 14 17 15   Temp: (!) 97.5 F (36.4 C) 98.2 F (36.8 C) 98.7 F (37.1 C) 98.1 F (36.7 C)  TempSrc: Oral Oral Oral Oral  SpO2:  100% 97% 95%  Weight:      Height:       Body mass index is 25.85 kg/m.  General: Alert awake, not in obvious distress, Communicative HENT: pupils equally reacting to light,  No scleral pallor or icterus noted. Oral mucosa is moist.  Chest:  Clear breath sounds.No crackles or wheezes.  CVS: S1 &S2 heard. No murmur.  Regular rate and rhythm.  Rate controlled Abdomen: Soft, nontender, nondistended.  Bowel sounds are heard.   Extremities: No cyanosis, clubbing, left lower extremity with erythema edema and mild tenderness.  Peripheral pulses are palpable. Psych: Alert, awake and oriented, normal mood CNS:  No cranial nerve deficits.  Power equal in all extremities.   Skin: Warm and dry.  Left lower extremity cellulitis  improving  The results of significant diagnostics from this hospitalization (including imaging, microbiology, ancillary and laboratory) are listed below for reference.     Diagnostic Studies:   ECHOCARDIOGRAM COMPLETE  Result Date: 04/02/2023    ECHOCARDIOGRAM REPORT   Patient Name:   Cody Mcconnell Date of Exam: 04/02/2023 Medical Rec #:  [id number]          Height:       68.0 in Accession #:    [id number]         Weight:       170.0 lb Date of Birth:  1946/04/28          BSA:          1.907 m Patient Age:    77 years           BP:           110/72 mmHg Patient Gender: M                  HR:           86  bpm. Exam Location:  Inpatient Procedure: 2D Echo, Color Doppler and Cardiac Doppler Indications:    NSTEMI  History:        Patient has prior history of Echocardiogram examinations, most                 recent 02/10/2023. Hypokalemia, NSTEMI, AKI, Aortic Valve Disease,                 Arrythmias:Atrial Fibrillation; Risk Factors:Dyslipidemia.  Sonographer:    Milbert Coulter Referring Phys: Therisa Doyne IMPRESSIONS  1. Left ventricular ejection fraction, by estimation, is 55 to 60%. The left ventricle has normal function. The left ventricle has no regional wall motion abnormalities. There is mild left ventricular hypertrophy. Left ventricular diastolic parameters were normal.  2. Right ventricular systolic function is normal. The right ventricular size is normal.  3. Left atrial size was mildly dilated.  4. The mitral valve is normal in structure. Mild mitral valve regurgitation. No evidence of mitral stenosis.  5. The aortic valve is calcified. There is severe calcifcation of the aortic valve. There is severe thickening of the aortic valve. Aortic valve regurgitation is trivial. Moderate to severe aortic valve stenosis. Aortic valve area, by VTI measures 0.80 cm. Aortic valve mean gradient measures 31.2 mmHg. Aortic valve Vmax measures 3.54 m/s.  6. The inferior vena cava is normal in size with  greater than 50% respiratory variability, suggesting right atrial pressure of 3 mmHg. Comparison(s): No significant change from prior study. FINDINGS  Left Ventricle: Left ventricular ejection fraction, by estimation, is 55 to 60%. The left ventricle has normal function. The left ventricle has no regional wall motion abnormalities. The left ventricular internal cavity size was normal in size. There is  mild left ventricular hypertrophy. Left ventricular diastolic parameters were normal. Right Ventricle: The right ventricular size is normal. No increase in right ventricular wall thickness. Right ventricular systolic function is normal. Left Atrium: Left atrial size was mildly dilated. Right Atrium: Right atrial size was normal in size. Pericardium: There is no evidence of pericardial effusion. Mitral Valve: The mitral valve is normal in structure. Mild mitral valve regurgitation. No evidence of mitral valve stenosis. Tricuspid Valve: The tricuspid valve is normal in structure. Tricuspid valve regurgitation is trivial. No evidence of tricuspid stenosis. Aortic Valve: The aortic valve is calcified. There is severe calcifcation of the aortic valve. There is severe thickening of the aortic valve. Aortic valve regurgitation is trivial. Moderate to severe aortic stenosis is present. Aortic valve mean gradient measures 31.2 mmHg. Aortic valve peak gradient measures 50.2 mmHg. Aortic valve area, by VTI measures 0.80 cm. Pulmonic Valve: The pulmonic valve was not well visualized. Pulmonic valve regurgitation is not visualized. No evidence of pulmonic stenosis. Aorta: The aortic root and ascending aorta are structurally normal, with no evidence of dilitation. Venous: The inferior vena cava is normal in size with greater than 50% respiratory variability, suggesting right atrial pressure of 3 mmHg. IAS/Shunts: The atrial septum is grossly normal.  LEFT VENTRICLE PLAX 2D LVIDd:         4.90 cm   Diastology LVIDs:         3.30 cm    LV e' medial:    9.57 cm/s LV PW:         1.20 cm   LV E/e' medial:  9.4 LV IVS:        1.20 cm   LV e' lateral:   12.50 cm/s LVOT diam:     1.90  cm   LV E/e' lateral: 7.2 LV SV:         60 LV SV Index:   32 LVOT Area:     2.84 cm  RIGHT VENTRICLE RV S prime:     15.20 cm/s TAPSE (M-mode): 2.0 cm LEFT ATRIUM             Index        RIGHT ATRIUM           Index LA diam:        4.60 cm 2.41 cm/m   RA Area:     13.10 cm LA Vol (A2C):   40.5 ml 21.23 ml/m  RA Volume:   29.20 ml  15.31 ml/m LA Vol (A4C):   59.5 ml 31.20 ml/m LA Biplane Vol: 51.1 ml 26.79 ml/m  AORTIC VALVE AV Area (Vmax):    0.87 cm AV Area (Vmean):   0.82 cm AV Area (VTI):     0.80 cm AV Vmax:           354.20 cm/s AV Vmean:          265.200 cm/s AV VTI:            0.754 m AV Peak Grad:      50.2 mmHg AV Mean Grad:      31.2 mmHg LVOT Vmax:         109.00 cm/s LVOT Vmean:        76.500 cm/s LVOT VTI:          0.212 m LVOT/AV VTI ratio: 0.28  AORTA Ao Root diam: 3.10 cm Ao Asc diam:  3.10 cm MITRAL VALVE MV Area (PHT): 4.17 cm    SHUNTS MV Decel Time: 182 msec    Systemic VTI:  0.21 m MV E velocity: 90.10 cm/s  Systemic Diam: 1.90 cm MV A velocity: 82.80 cm/s MV E/A ratio:  1.09 Jodelle Red MD Electronically signed by Jodelle Red MD Signature Date/Time: 04/02/2023/12:02:21 PM    Final    CT Angio Chest Pulmonary Embolism (PE) W or WO Contrast  Result Date: 04/02/2023 CLINICAL DATA:  Cardiac palpitations and difficulty breathing EXAM: CT ANGIOGRAPHY CHEST WITH CONTRAST TECHNIQUE: Multidetector CT imaging of the chest was performed using the standard protocol during bolus administration of intravenous contrast. Multiplanar CT image reconstructions and MIPs were obtained to evaluate the vascular anatomy. RADIATION DOSE REDUCTION: This exam was performed according to the departmental dose-optimization program which includes automated exposure control, adjustment of the mA and/or kV according to patient size and/or use of  iterative reconstruction technique. CONTRAST:  75mL OMNIPAQUE IOHEXOL 350 MG/ML SOLN COMPARISON:  Plain film from earlier in the same day. FINDINGS: Cardiovascular: Thoracic aorta shows normal branching pattern with atherosclerotic calcifications. No aneurysmal dilatation is seen. Heart is not significantly enlarged in size. The pulmonary artery shows a normal branching pattern bilaterally. No filling defect to suggest pulmonary embolism is noted. Coronary calcifications are noted. Mediastinum/Nodes: Thoracic inlet is within normal limits. No hilar or mediastinal adenopathy is noted. The esophagus as visualized is within normal limits. Lungs/Pleura: Lungs are well aerated bilaterally. No focal infiltrate or sizable effusion is seen. Upper Abdomen: Visualized upper abdomen is within normal limits. Musculoskeletal: No acute abnormality noted. Review of the MIP images confirms the above findings. IMPRESSION: No evidence of pulmonary emboli. No acute abnormality seen. Electronically Signed   By: Alcide Clever M.D.   On: 04/02/2023 00:22   US Venous Img Lower  Left (DVT Study)  Result  Date: 04/01/2023 CLINICAL DATA:  Redness and swelling EXAM: Left LOWER EXTREMITY VENOUS DOPPLER ULTRASOUND TECHNIQUE: Gray-scale sonography with compression, as well as color and duplex ultrasound, were performed to evaluate the deep venous system(s) from the level of the common femoral vein through the popliteal and proximal calf veins. COMPARISON:  None Available. FINDINGS: VENOUS Normal compressibility of the common femoral, superficial femoral, and popliteal veins, as well as the visualized calf veins. Visualized portions of profunda femoral vein and great saphenous vein unremarkable. No filling defects to suggest DVT on grayscale or color Doppler imaging. Doppler waveforms show normal direction of venous flow, normal respiratory plasticity and response to augmentation. Limited views of the contralateral common femoral vein are  unremarkable. OTHER None. Limitations: none IMPRESSION: No evidence of left lower extremity DVT Electronically Signed   By: Karen Kays M.D.   On: 04/01/2023 11:15   DG Tibia/Fibula Left  Result Date: 04/01/2023 CLINICAL DATA:  Redness swelling ?DVT EXAM: LEFT TIBIA AND FIBULA - 2 VIEW COMPARISON:  None Available. FINDINGS: No acute fracture or dislocation. No aggressive osseous lesion. There are degenerative changes of the knee joint in the form of mildly reduced medial tibio-femoral compartment joint space, tibial spiking and tricompartmental osteophytosis. Ankle mortise appears intact. No focal soft tissue swelling. No radiopaque foreign bodies. IMPRESSION: *No acute osseous abnormality. *Mild degenerative changes of the left knee joint. Electronically Signed   By: Jules Schick M.D.   On: 04/01/2023 10:37   DG Chest Port 1 View  Result Date: 04/01/2023 CLINICAL DATA:  Palpitations EXAM: PORTABLE CHEST 1 VIEW COMPARISON:  CT scan chest from 11/26/2022. FINDINGS: Bilateral lung fields are clear. Bilateral costophrenic angles are clear. Normal cardio-mediastinal silhouette. No acute osseous abnormalities. Partially seen lower cervical spinal fixation hardware. The soft tissues are within normal limits. IMPRESSION: No active disease. Electronically Signed   By: Jules Schick M.D.   On: 04/01/2023 10:35     Labs:   Basic Metabolic Panel: Recent Labs  Lab 04/01/23 0955 04/01/23 2211 04/02/23 0056 04/03/23 0622 04/04/23 0308  NA 132* 133* 131* 137 136  K 3.2* 3.9 3.3* 3.7 3.5  CL 96* 96* 99 107 106  CO2 22 24 23  21* 21*  GLUCOSE 146* 146* 160* 120* 144*  BUN 21 21 20 12 11   CREATININE 1.46* 1.53* 1.38* 0.92 0.92  CALCIUM 9.0 8.4* 7.9* 8.1* 8.0*  MG  --  2.1 1.9 2.0 1.8  PHOS  --  1.4* 1.6*  --   --    GFR Estimated Creatinine Clearance: 66.1 mL/min (by C-G formula based on SCr of 0.92 mg/dL). Liver Function Tests: Recent Labs  Lab 04/01/23 0955 04/02/23 0056  AST 37 28  ALT  19 20  ALKPHOS 61 57  BILITOT 1.2 0.9  PROT 7.4 5.8*  ALBUMIN 3.9 2.6*   No results for input(s): "LIPASE", "AMYLASE" in the last 168 hours. No results for input(s): "AMMONIA" in the last 168 hours. Coagulation profile Recent Labs  Lab 04/01/23 2211  INR 1.3*    CBC: Recent Labs  Lab 04/01/23 0955 04/02/23 0056 04/03/23 0622 04/04/23 0308  WBC 10.8* 7.9 6.0 6.2  NEUTROABS 9.0*  --   --   --   HGB 14.2 12.1* 10.9* 10.3*  HCT 42.8 35.3* 32.8* 31.7*  MCV 83.6 82.1 82.8 84.8  PLT 189 167 171 183   Cardiac Enzymes: Recent Labs  Lab 04/01/23 2211  CKTOTAL 156   BNP: Invalid input(s): "POCBNP" CBG: No results for input(s): "GLUCAP" in  the last 168 hours. D-Dimer Recent Labs    04/01/23 2211  DDIMER 1.20*   Hgb A1c No results for input(s): "HGBA1C" in the last 72 hours. Lipid Profile No results for input(s): "CHOL", "HDL", "LDLCALC", "TRIG", "CHOLHDL", "LDLDIRECT" in the last 72 hours. Thyroid function studies Recent Labs    04/01/23 2211  TSH 2.767   Anemia work up No results for input(s): "VITAMINB12", "FOLATE", "FERRITIN", "TIBC", "IRON", "RETICCTPCT" in the last 72 hours. Microbiology Recent Results (from the past 240 hour(s))  Culture, blood (Routine X 2) w Reflex to ID Panel     Status: None (Preliminary result)   Collection Time: 04/01/23  9:55 AM   Specimen: Right Antecubital; Blood  Result Value Ref Range Status   Specimen Description   Final    RIGHT ANTECUBITAL BLOOD Performed at [facility], [address] [place], [place] [address]    Special Requests   Final    BOTTLES DRAWN AEROBIC AND ANAEROBIC Blood Culture adequate volume Performed at [facility], [address], [place], [place] [address]    Culture   Final    NO GROWTH 3 DAYS Performed at [facility], [address] [place], [place] [address]    Report Status PENDING  Incomplete  Culture, blood (Routine X 2) w Reflex to ID Panel     Status:  None (Preliminary result)   Collection Time: 04/01/23 10:00 AM   Specimen: Left Antecubital; Blood  Result Value Ref Range Status   Specimen Description   Final    LEFT ANTECUBITAL BLOOD Performed at [facility], [address], [place], [place] [address]    Special Requests   Final    BOTTLES DRAWN AEROBIC AND ANAEROBIC Blood Culture adequate volume Performed at [facility], [address] [place], [place] [address]    Culture   Final    NO GROWTH 3 DAYS Performed at [facility], [address], [place], [place] [address]    Report Status PENDING  Incomplete  Urine Culture     Status: None   Collection Time: 04/01/23 11:50 AM   Specimen: Urine, Clean Catch  Result Value Ref Range Status   Specimen Description   Final    URINE, CLEAN CATCH Performed at [facility], [address] [place], [place] [address]    Special Requests   Final    NONE Performed at [facility], [address], [place], [place] [address]    Culture   Final    NO GROWTH Performed at [facility], [address], [place], [place] [address]    Report Status 04/02/2023 FINAL  Final  SARS Coronavirus 2 by RT PCR (hospital order, performed in [facility] hospital lab) *cepheid single result test* Nasal Mucosa     Status: None   Collection Time: 04/01/23  9:11 PM   Specimen: Nasal Mucosa; Nasal Swab  Result Value Ref Range Status   SARS Coronavirus 2 by RT PCR NEGATIVE NEGATIVE Final    Comment: Performed at [facility], [address], [place], [place] [address]  MRSA Next Gen by PCR, Nasal     Status: None   Collection Time: 04/01/23  9:11 PM   Specimen: Nasal Mucosa; Nasal Swab  Result Value Ref Range Status   MRSA by PCR Next Gen NOT DETECTED NOT DETECTED Final    Comment: (NOTE) The GeneXpert MRSA Assay (FDA approved for NASAL specimens only), is  one component of a comprehensive MRSA colonization  surveillance program. It is not intended to diagnose MRSA infection nor to guide or monitor treatment for MRSA infections. Test performance is not FDA approved in patients less than 53 years old. Performed at [facility], [address], [place], [place] [address]      Discharge Instructions:   Discharge Instructions     Call MD for:  severe uncontrolled pain   Complete by: As directed    Call MD for:  temperature >100.4   Complete by: As directed    Diet - low sodium heart healthy   Complete by: As directed    Discharge instructions   Complete by: As directed    Follow-up with your primary care provider in 1 week.  Check blood work at that time.  Follow-up with cardiology Dr. Excell Seltzer as outpatient. Seek medical attention for worsening symptoms.  Keep the affected extremity elevated while sitting or lying down.  Complete the course of antibiotic.  Take precautions while on blood thinners. Ok to take tylenol for pain. Try to limit naproxen while on blood thinners   Increase activity slowly   Complete by: As directed       Allergies as of 04/04/2023   No Known Allergies      Medication List     STOP taking these medications    albuterol 108 (90 Base) MCG/ACT inhaler Commonly known as: VENTOLIN HFA   naproxen sodium 220 MG tablet Commonly known as: ALEVE       TAKE these medications    acetaminophen 325 MG tablet Commonly known as: Tylenol Take 2 tablets (650 mg total) by mouth every 6 (six) hours as needed for mild pain or fever.   cephALEXin 500 MG capsule Commonly known as: KEFLEX Take 1 capsule (500 mg total) by mouth 4 (four) times daily for 5 days.   cetirizine 10 MG tablet Commonly known as: ZYRTEC Take 10 mg by mouth daily.   diclofenac Sodium 1 % Gel Commonly known as: VOLTAREN Apply 1 Application topically 2 (two) times daily as needed (joint pain).   doxycycline 100 MG tablet Commonly known as: VIBRA-TABS Take 1 tablet (100 mg total) by  mouth 2 (two) times daily for 5 days.   fluticasone 50 MCG/ACT nasal spray Commonly known as: FLONASE Place 1 spray into both nostrils daily as needed for allergies or rhinitis.   methotrexate 7.5 MG tablet Commonly known as: RHEUMATREX Take 7.5 mg by mouth once a week. Caution" Chemotherapy. Protect from light.   metoprolol tartrate 25 MG tablet Commonly known as: LOPRESSOR Take 0.5 tablets (12.5 mg total) by mouth 2 (two) times daily.   montelukast 10 MG tablet Commonly known as: SINGULAIR Take 10 mg by mouth at bedtime.   rivaroxaban 20 MG Tabs tablet Commonly known as: XARELTO Take 1 tablet (20 mg total) by mouth daily with supper.   sildenafil 100 MG tablet Commonly known as: VIAGRA Take 100 mg by mouth daily as needed for erectile dysfunction.   simvastatin 40 MG tablet Commonly known as: ZOCOR Take 40 mg by mouth daily.   triamterene-hydrochlorothiazide 37.5-25 MG capsule Commonly known as: DYAZIDE Take 1 capsule by mouth every morning.        Follow-up Information     Linus Galas, NP Follow up in 1 week(s).   Why: regular followup, blood work Contact information: [address] [place] [address] [phone]         Tonny Bollman,  MD. Schedule an appointment as soon as possible for a visit in 2 week(s).   Specialty: Cardiology Contact information: [address] [place] [address] [phone]                  Time coordinating discharge: 39 minutes  Signed:  Nansi Birmingham  [facility] 04/04/2023, 11:21 AM

## 2023-04-06 ENCOUNTER — Other Ambulatory Visit: Payer: Self-pay

## 2023-04-06 ENCOUNTER — Inpatient Hospital Stay (HOSPITAL_BASED_OUTPATIENT_CLINIC_OR_DEPARTMENT_OTHER)
Admission: EM | Admit: 2023-04-06 | Discharge: 2023-04-09 | DRG: 603 | Disposition: A | Discharge status: Home / Self Care | Payer: Medicare Other | Attending: Internal Medicine | Admitting: Internal Medicine

## 2023-04-06 ENCOUNTER — Encounter (HOSPITAL_BASED_OUTPATIENT_CLINIC_OR_DEPARTMENT_OTHER): Payer: Self-pay | Admitting: Emergency Medicine

## 2023-04-06 ENCOUNTER — Emergency Department (HOSPITAL_BASED_OUTPATIENT_CLINIC_OR_DEPARTMENT_OTHER): Payer: Medicare Other

## 2023-04-06 DIAGNOSIS — Z87891 Personal history of nicotine dependence: Secondary | ICD-10-CM | POA: Diagnosis not present

## 2023-04-06 DIAGNOSIS — I7090 Unspecified atherosclerosis: Secondary | ICD-10-CM | POA: Diagnosis not present

## 2023-04-06 DIAGNOSIS — M7989 Other specified soft tissue disorders: Secondary | ICD-10-CM | POA: Diagnosis not present

## 2023-04-06 DIAGNOSIS — L03116 Cellulitis of left lower limb: Principal | ICD-10-CM

## 2023-04-06 DIAGNOSIS — M1712 Unilateral primary osteoarthritis, left knee: Secondary | ICD-10-CM | POA: Diagnosis not present

## 2023-04-06 DIAGNOSIS — R059 Cough, unspecified: Secondary | ICD-10-CM | POA: Diagnosis present

## 2023-04-06 DIAGNOSIS — R140 Abdominal distension (gaseous): Secondary | ICD-10-CM | POA: Diagnosis present

## 2023-04-06 DIAGNOSIS — E7800 Pure hypercholesterolemia, unspecified: Secondary | ICD-10-CM | POA: Diagnosis present

## 2023-04-06 DIAGNOSIS — I2510 Atherosclerotic heart disease of native coronary artery without angina pectoris: Secondary | ICD-10-CM | POA: Diagnosis present

## 2023-04-06 DIAGNOSIS — M069 Rheumatoid arthritis, unspecified: Secondary | ICD-10-CM | POA: Diagnosis not present

## 2023-04-06 DIAGNOSIS — Z79899 Other long term (current) drug therapy: Secondary | ICD-10-CM | POA: Diagnosis not present

## 2023-04-06 DIAGNOSIS — Z981 Arthrodesis status: Secondary | ICD-10-CM

## 2023-04-06 DIAGNOSIS — I6523 Occlusion and stenosis of bilateral carotid arteries: Secondary | ICD-10-CM | POA: Diagnosis present

## 2023-04-06 DIAGNOSIS — I10 Essential (primary) hypertension: Secondary | ICD-10-CM | POA: Diagnosis present

## 2023-04-06 DIAGNOSIS — Z96641 Presence of right artificial hip joint: Secondary | ICD-10-CM | POA: Diagnosis present

## 2023-04-06 DIAGNOSIS — Z8249 Family history of ischemic heart disease and other diseases of the circulatory system: Secondary | ICD-10-CM | POA: Diagnosis not present

## 2023-04-06 DIAGNOSIS — R609 Edema, unspecified: Secondary | ICD-10-CM | POA: Diagnosis not present

## 2023-04-06 DIAGNOSIS — I480 Paroxysmal atrial fibrillation: Secondary | ICD-10-CM | POA: Diagnosis not present

## 2023-04-06 DIAGNOSIS — N400 Enlarged prostate without lower urinary tract symptoms: Secondary | ICD-10-CM | POA: Diagnosis present

## 2023-04-06 DIAGNOSIS — Z7901 Long term (current) use of anticoagulants: Secondary | ICD-10-CM | POA: Diagnosis not present

## 2023-04-06 DIAGNOSIS — L0390 Cellulitis, unspecified: Principal | ICD-10-CM

## 2023-04-06 DIAGNOSIS — I350 Nonrheumatic aortic (valve) stenosis: Secondary | ICD-10-CM | POA: Diagnosis not present

## 2023-04-06 DIAGNOSIS — L089 Local infection of the skin and subcutaneous tissue, unspecified: Secondary | ICD-10-CM | POA: Diagnosis not present

## 2023-04-06 DIAGNOSIS — H918X3 Other specified hearing loss, bilateral: Secondary | ICD-10-CM | POA: Diagnosis present

## 2023-04-06 LAB — CBC WITH DIFFERENTIAL/PLATELET
Abs Immature Granulocytes: 0.3 10*3/uL — ABNORMAL HIGH (ref 0.00–0.07)
Band Neutrophils: 2 %
Basophils Absolute: 0 10*3/uL (ref 0.0–0.1)
Basophils Relative: 0 %
Eosinophils Absolute: 0.2 10*3/uL (ref 0.0–0.5)
Eosinophils Relative: 2 %
HCT: 39.5 % (ref 39.0–52.0)
Hemoglobin: 13.2 g/dL (ref 13.0–17.0)
Lymphocytes Relative: 23 %
Lymphs Abs: 2 10*3/uL (ref 0.7–4.0)
MCH: 28.1 pg (ref 26.0–34.0)
MCHC: 33.4 g/dL (ref 30.0–36.0)
MCV: 84.2 fL (ref 80.0–100.0)
Metamyelocytes Relative: 2 %
Monocytes Absolute: 0.3 10*3/uL (ref 0.1–1.0)
Monocytes Relative: 3 %
Myelocytes: 2 %
Neutro Abs: 5.8 10*3/uL (ref 1.7–7.7)
Neutrophils Relative %: 66 %
Platelets: 354 10*3/uL (ref 150–400)
RBC: 4.69 MIL/uL (ref 4.22–5.81)
RDW: 13.9 % (ref 11.5–15.5)
WBC: 8.5 10*3/uL (ref 4.0–10.5)
nRBC: 0 % (ref 0.0–0.2)

## 2023-04-06 LAB — COMPREHENSIVE METABOLIC PANEL
ALT: 31 U/L (ref 0–44)
AST: 25 U/L (ref 15–41)
Albumin: 3.7 g/dL (ref 3.5–5.0)
Alkaline Phosphatase: 74 U/L (ref 38–126)
Anion gap: 10 (ref 5–15)
BUN: 20 mg/dL (ref 8–23)
CO2: 26 mmol/L (ref 22–32)
Calcium: 9.4 mg/dL (ref 8.9–10.3)
Chloride: 104 mmol/L (ref 98–111)
Creatinine, Ser: 0.78 mg/dL (ref 0.61–1.24)
GFR, Estimated: >60 mL/min (ref >60)
Glucose, Bld: 116 mg/dL — ABNORMAL HIGH (ref 70–99)
Potassium: 3.8 mmol/L (ref 3.5–5.1)
Sodium: 140 mmol/L (ref 135–145)
Total Bilirubin: 0.3 mg/dL (ref 0.3–1.2)
Total Protein: 6.9 g/dL (ref 6.5–8.1)

## 2023-04-06 LAB — URINALYSIS, ROUTINE W REFLEX MICROSCOPIC
Bilirubin Urine: NEGATIVE
Glucose, UA: NEGATIVE mg/dL
Hgb urine dipstick: NEGATIVE
Ketones, ur: NEGATIVE mg/dL
Leukocytes,Ua: NEGATIVE
Nitrite: NEGATIVE
Protein, ur: NEGATIVE mg/dL
Specific Gravity, Urine: 1.017 (ref 1.005–1.030)
pH: 6.5 (ref 5.0–8.0)

## 2023-04-06 LAB — LACTIC ACID, PLASMA
Lactic Acid, Venous: 0.9 mmol/L (ref 0.5–1.9)
Lactic Acid, Venous: 0.7 mmol/L (ref 0.5–1.9)

## 2023-04-06 MED ORDER — ACETAMINOPHEN 650 MG RE SUPP: 650.0000 mg | Freq: Four times a day (QID) | RECTAL | Status: DC | PRN

## 2023-04-06 MED ORDER — METOPROLOL TARTRATE 12.5 MG HALF TABLET: 12.5000 mg | ORAL_TABLET | Freq: Two times a day (BID) | ORAL | Status: DC

## 2023-04-06 MED ORDER — RIVAROXABAN 20 MG PO TABS: 20.0000 mg | ORAL_TABLET | Freq: Every day | ORAL | Status: DC

## 2023-04-06 MED ORDER — TRIAMTERENE-HCTZ 37.5-25 MG PO TABS
1.0000 | ORAL_TABLET | Freq: Every morning | ORAL | Status: DC
  Administered 2023-04-07: 1 via ORAL
  Filled 2023-04-06: qty 1

## 2023-04-06 MED ORDER — VANCOMYCIN HCL IN DEXTROSE 1-5 GM/200ML-% IV SOLN: 1000.0000 mg | Freq: Once | INTRAVENOUS | Status: DC

## 2023-04-06 MED ORDER — SIMVASTATIN 20 MG PO TABS
40.0000 mg | ORAL_TABLET | Freq: Every day | ORAL | Status: DC
  Administered 2023-04-07: 40 mg via ORAL
  Filled 2023-04-06: qty 2

## 2023-04-06 MED ORDER — SODIUM CHLORIDE 0.9 % IV SOLN
2.0000 g | Freq: Once | INTRAVENOUS | Status: AC
  Administered 2023-04-06: 2 g via INTRAVENOUS
  Filled 2023-04-06: qty 12.5

## 2023-04-06 MED ORDER — SODIUM CHLORIDE 0.9 % IV SOLN
1.0000 g | INTRAVENOUS | Status: DC
  Administered 2023-04-07: 1 g via INTRAVENOUS
  Filled 2023-04-06: qty 10

## 2023-04-06 MED ORDER — SODIUM CHLORIDE 0.9 % IV SOLN
100.0000 mg | Freq: Once | INTRAVENOUS | Status: AC
  Administered 2023-04-06: 100 mg via INTRAVENOUS
  Filled 2023-04-06: qty 100

## 2023-04-06 MED ORDER — VANCOMYCIN HCL 500 MG IV SOLR
500.0000 mg | Freq: Once | INTRAVENOUS | Status: AC
  Administered 2023-04-06: 500 mg via INTRAVENOUS

## 2023-04-06 MED ORDER — ONDANSETRON HCL 4 MG/2ML IJ SOLN: 4.0000 mg | Freq: Four times a day (QID) | INTRAMUSCULAR | Status: DC | PRN

## 2023-04-06 MED ORDER — DOXYCYCLINE HYCLATE 100 MG PO TABS
100.0000 mg | ORAL_TABLET | Freq: Two times a day (BID) | ORAL | Status: DC
  Administered 2023-04-07: 100 mg via ORAL
  Filled 2023-04-06: qty 1

## 2023-04-06 MED ORDER — ONDANSETRON HCL 4 MG PO TABS: 4.0000 mg | ORAL_TABLET | Freq: Four times a day (QID) | ORAL | Status: DC | PRN

## 2023-04-06 MED ORDER — HYDROCODONE-ACETAMINOPHEN 5-325 MG PO TABS: 1.0000 | ORAL_TABLET | ORAL | Status: DC | PRN

## 2023-04-06 MED ORDER — VANCOMYCIN HCL IN DEXTROSE 1-5 GM/200ML-% IV SOLN
1000.0000 mg | Freq: Once | INTRAVENOUS | Status: AC
  Administered 2023-04-06: 1000 mg via INTRAVENOUS
  Filled 2023-04-06: qty 200

## 2023-04-06 MED ORDER — ACETAMINOPHEN 325 MG PO TABS: 650.0000 mg | ORAL_TABLET | Freq: Four times a day (QID) | ORAL | Status: DC | PRN

## 2023-04-06 MED ORDER — POLYETHYLENE GLYCOL 3350 17 G PO PACK: 17.0000 g | PACK | Freq: Every day | ORAL | Status: DC | PRN

## 2023-04-06 MED ORDER — MONTELUKAST SODIUM 10 MG PO TABS
10.0000 mg | ORAL_TABLET | Freq: Every day | ORAL | Status: DC
  Administered 2023-04-07 – 2023-04-08 (×3): 10 mg via ORAL
  Filled 2023-04-06 (×3): qty 1

## 2023-04-06 NOTE — ED Provider Notes (Signed)
[place] EMERGENCY DEPARTMENT AT [facility] Provider Note   CSN: [id number] Arrival date & time: 04/06/23  1637     History  Chief Complaint  Patient presents with   Cellulitis    Cody Mcconnell is a 77 y.o. male.  Patient is a 77 year old male who presents with left leg pain and swelling.  He was recently admitted to the hospital last week for atrial fibrillation and left leg cellulitis.  He had recently returned from a trip from [place].  He does report that he got a small abrasion on a rock while he was in [place].  He did go in the ocean there.  About a week ago he started having some fever and chills and then last week he noticed that his left leg was markedly swollen and red.  He went to the emergency department and was noted to be in A-fib with RVR.  He was admitted to the hospital.  He was initially started on vancomycin and cefepime.  He had an ultrasound which showed no evidence of DVT.  He said it seemed like the infection and got a little bit better and he was discharged on Xarelto as well as Keflex and doxycycline.  He has been taking them as directed.  He states he feels like the infection is getting worse.  The redness is started increasing.  The swelling is increased and now the redness is streaking up his thigh.  He denies any known fevers.  He does have a history of rheumatoid arthritis and is on methotrexate and recently was on a course of steroids.       Home Medications Prior to Admission medications   Medication Sig Start Date End Date Taking? Authorizing Provider  acetaminophen (TYLENOL) 325 MG tablet Take 2 tablets (650 mg total) by mouth every 6 (six) hours as needed for mild pain or fever. 04/04/23 04/03/24  Pokhrel, Rebekah Chesterfield, MD  cephALEXin (KEFLEX) 500 MG capsule Take 1 capsule (500 mg total) by mouth 4 (four) times daily for 5 days. 04/04/23 04/09/23  Pokhrel, Rebekah Chesterfield, MD  cetirizine (ZYRTEC) 10 MG tablet Take 10 mg by mouth daily.    [provider]  diclofenac Sodium (VOLTAREN) 1 % GEL Apply 1 Application topically 2 (two) times daily as needed (joint pain).    [provider]  doxycycline (VIBRA-TABS) 100 MG tablet Take 1 tablet (100 mg total) by mouth 2 (two) times daily for 5 days. 04/04/23 04/09/23  Pokhrel, Rebekah Chesterfield, MD  fluticasone (FLONASE) 50 MCG/ACT nasal spray Place 1 spray into both nostrils daily as needed for allergies or rhinitis.    [provider]  methotrexate (RHEUMATREX) 7.5 MG tablet Take 7.5 mg by mouth once a week. Caution" Chemotherapy. Protect from light.    [provider]  metoprolol tartrate (LOPRESSOR) 25 MG tablet Take 0.5 tablets (12.5 mg total) by mouth 2 (two) times daily. 04/04/23 07/03/23  Pokhrel, Rebekah Chesterfield, MD  montelukast (SINGULAIR) 10 MG tablet Take 10 mg by mouth at bedtime.    [provider]  rivaroxaban (XARELTO) 20 MG TABS tablet Take 1 tablet (20 mg total) by mouth daily with supper. 04/04/23 07/03/23  Pokhrel, Rebekah Chesterfield, MD  sildenafil (VIAGRA) 100 MG tablet Take 100 mg by mouth daily as needed for erectile dysfunction.    [provider]  simvastatin (ZOCOR) 40 MG tablet Take 40 mg by mouth daily.    [provider]  triamterene-hydrochlorothiazide (DYAZIDE) 37.5-25 MG capsule Take 1 capsule by mouth every morning.  07/05/21   [provider]      Allergies    Patient has no known allergies.    Review of Systems   Review of Systems  Constitutional:  Positive for fatigue. Negative for chills, diaphoresis and fever.  HENT:  Negative for congestion, rhinorrhea and sneezing.   Eyes: Negative.   Respiratory:  Negative for cough, chest tightness and shortness of breath.   Cardiovascular:  Positive for leg swelling. Negative for chest pain.  Gastrointestinal:  Negative for abdominal pain, blood in stool, diarrhea, nausea and vomiting.  Genitourinary:  Negative for difficulty urinating, flank pain, frequency and hematuria.   Musculoskeletal:  Negative for arthralgias and back pain.  Skin:  Positive for color change. Negative for rash.  Neurological:  Negative for dizziness, speech difficulty, weakness, numbness and headaches.    Physical Exam Updated Vital Signs BP (!) 144/83   Pulse 65   Temp 98.3 F (36.8 C) (Oral)   Resp 17   Ht 5\' 8"  (1.727 m)   Wt 77.1 kg   SpO2 96%   BMI 25.84 kg/m  Physical Exam Constitutional:      Appearance: He is well-developed.  HENT:     Head: Normocephalic and atraumatic.  Eyes:     Pupils: Pupils are equal, round, and reactive to light.  Cardiovascular:     Rate and Rhythm: Normal rate and regular rhythm.     Heart sounds: Murmur heard.  Pulmonary:     Effort: Pulmonary effort is normal. No respiratory distress.     Breath sounds: Normal breath sounds. No wheezing or rales.  Chest:     Chest wall: No tenderness.  Abdominal:     General: Bowel sounds are normal.     Palpations: Abdomen is soft.     Tenderness: There is no abdominal tenderness. There is no guarding or rebound.  Musculoskeletal:        General: Normal range of motion.     Cervical back: Normal range of motion and neck supple.     Comments: Left leg is markedly swollen with erythema concentrated in the lower leg but there streaking up to his posterior thigh area.  I do not feel any areas of induration or fluctuance.  There is some weeping of some areas but I do not see any purulent drainage.  Lymphadenopathy:     Cervical: No cervical adenopathy.  Skin:    General: Skin is warm and dry.     Findings: No rash.  Neurological:     Mental Status: He is alert and oriented to person, place, and time.        ED Results / Procedures / Treatments   Labs (all labs ordered are listed, but only abnormal results are displayed) Labs Reviewed  COMPREHENSIVE METABOLIC PANEL - Abnormal; Notable for the following components:      Result Value   Glucose, Bld 116 (*)    All other components within  normal limits  CBC WITH DIFFERENTIAL/PLATELET - Abnormal; Notable for the following components:   Abs Immature Granulocytes 0.30 (*)    All other components within normal limits  CULTURE, BLOOD (ROUTINE X 2)  CULTURE, BLOOD (ROUTINE X 2)  LACTIC ACID, PLASMA  LACTIC ACID, PLASMA  URINALYSIS, ROUTINE W REFLEX MICROSCOPIC    EKG None  Radiology DG Tibia/Fibula Left  Result Date: 04/06/2023 CLINICAL DATA:  Infection. EXAM: LEFT TIBIA AND FIBULA - 2 VIEW COMPARISON:  X-ray 04/01/23 FINDINGS: No significant interval change. No fracture or dislocation.  Preserved bone mineralization and adjacent joint spaces. Well corticated density seen about the knee joint at the edge of the imaging field. No erosive changes identified. If there is further concern of bone infection, bone scan or MRI may be useful for further sensitivity. IMPRESSION: No acute osseous abnormality. Electronically Signed   By: Karen Kays M.D.   On: 04/06/2023 19:07    Procedures Procedures    Medications Ordered in ED Medications  vancomycin (VANCOCIN) IVPB 1000 mg/200 mL premix (1,000 mg Intravenous New Bag/Given 04/06/23 1948)    Followed by  vancomycin (VANCOCIN) 500 mg in sodium chloride 0.9 % 100 mL IVPB (500 mg Intravenous New Bag/Given 04/06/23 1954)  doxycycline (VIBRAMYCIN) 100 mg in sodium chloride 0.9 % 250 mL IVPB (has no administration in time range)  ceFEPIme (MAXIPIME) 2 g in sodium chloride 0.9 % 100 mL IVPB (0 g Intravenous Stopped 04/06/23 2021)    ED Course/ Medical Decision Making/ A&P                             Medical Decision Making Amount and/or Complexity of Data Reviewed Labs: ordered. Radiology: ordered.  Risk Prescription drug management. Decision regarding hospitalization.   Patient is a 77 year old male who presents with worsening cellulitis of his left leg despite oral antibiotics.  He is afebrile.  There is no suggestions of sepsis.  However clinically looks like his cellulitis has  progressed.  He did have an ultrasound that ruled out DVT last week so I did not repeat this.  He had an x-ray done today which was interpreted by me and confirmed by the radiologist to show no gas in the tissues.  No evident bony involvement.  No foreign bodies.  He was started on IV antibiotics including cefepime, vancomycin and I did add doxycycline given that there is a possible vibrio exposure.  I spoke with Dr. Joneen Roach who will admit the patient for further treatment.  Final Clinical Impression(s) / ED Diagnoses Final diagnoses:  Cellulitis, unspecified cellulitis site    Rx / DC Orders ED Discharge Orders     None         Rolan Bucco, MD 04/06/23 2044

## 2023-04-06 NOTE — H&P (Signed)
History and Physical    Cody Mcconnell ZOX:[id number] DOB: [birth year] DOA: 04/06/2023  PCP: Linus Galas, NP   Patient coming from: Home  Chief Complaint: Increased left leg redness, pain, and swelling   HPI: Cody Mcconnell is a pleasant 77 y.o. male with medical history significant for hypertension, hyperlipidemia, rheumatoid arthritis, aortic stenosis, PAF on Xarelto, and left lower extremity cellulitis undergoing treatment with Keflex and doxycycline who presents to the ED with worsening left leg redness, pain, and swelling.   He had been treated during the recent hospitalization with vancomycin and cefepime and was then discharged with doxycycline and Keflex.  He had recently suffered a an abrasion on his leg from thorns in [place] and was then swimming in [place], raising concern for possible vibrio exposure.  He has had increased swelling, pain, and redness despite adherence with his antibiotics.  He also reports fever and drenching sweats at home.  [facility] ED Course: Upon arrival to the ED, patient is found to be afebrile and saturating well on room air with normal heart rate and stable blood pressure.  Labs are most notable for normal WBC, normal lactate, and normal renal function.  Plain films of the lower left leg were negative for acute osseous abnormalities.  Blood cultures were collected in the ED, patient was treated with vancomycin, cefepime, and doxycycline, and he was transferred to [facility] for admission.  Review of Systems:  All other systems reviewed and apart from HPI, are negative.  Past Medical History:  Diagnosis Date   Anemia    SLIGHT ANEMIA 4 MONTHS AGO   Aortic valve stenosis 12/19/2017   Echo 07/30/16 - EF 55-60, mild aortic stenosis (mean 9) // Echo 4/19:  EF 60-65 mild AS (mean 11) // Echocardiogram 11/21: EF 60-65, no RWMA, mild LVH, Gr 1 DD, normal RVSF, trivial MR, calcified AV, mod AS (mean 20 mmHg, Vmax 280 cm/s, DI  0.27), trivial AI  // Echo 5/22: EF 60-65, moderate LVH, no RWMA, trivial MR, moderate AS (mean gradient 20 mmHg, V-max 287 cm/s, DI 0.35)    Arthritis    OA   Benign prostate hyperplasia    unspecified whether lower urinary tract sym. present    Carotid atherosclerosis    Carotid US 4/19:  bilat ICA 1-39; vertebrals and subclavians normal   Carotid atherosclerosis, bilateral 04/30/2018   1-39% on 12/2017 [facility] Echo   Coronary artery calcification seen on CT scan 06/05/2018   Dysrhythmia    ATRIAL FIB   Elevated prostate specific antigen (PSA) 08/30/2016   Glucose intolerance    "pre-diabetic"   Hayfever    Heart murmur    History of exercise stress test    GXT 4/19:  ETT with good exercise tolerance (10:00); no chest pain; normal BP response; no diagnostic ST changes; negative adequate ETT.   HOH (hard of hearing)    BOTH EARS   Low back pain with sciatica 06/02/2012   Lumbar herniated disc 01/30/2018   Mild aortic stenosis 04/30/2018   12/2017 [facility] Echo : Mean gradient (S): 11 mm Hg. Peak gradient (S): 24 mm Hg.   Numbness of right thumb    FROM CERVICAL NECK SURGERY   Osteoarthritis of right hip 09/21/2018   Overweight (BMI 25.0-29.9) 09/30/2018   Paroxysmal atrial fibrillation (HCC) 06/05/2018   Pre-diabetes    Prediabetes 04/30/2018   not sure about this   Prostatitis    unspecified prostatitis type   Pure hypercholesterolemia  Past Surgical History:  Procedure Laterality Date   ANTERIOR LAT LUMBAR FUSION Right 06/29/2020   Procedure: Right Lumbar Two-Three Anterolateral lumbar interbody fusion with lateral plate;  Surgeon: Maeola Harman, MD;  Location: [facility] OR;  Service: Neurosurgery;  Laterality: Right;  Right Lumbar Two-Three Anterolateral lumbar interbody fusion with lateral plate   CERVICAL SPINE SURGERY  2011   COLONOSCOPY     HIP SURGERY Right    arthroscopy   LUMBAR DISC SURGERY  2014   L2-3, L3 TO L4 X 2 2019 AT [facility]   LUMBAR FUSION   2019   psoas muscle repair (groin muscle?)  2022   SHOULDER ARTHROSCOPY Right    TOOTH EXTRACTION     TOTAL HIP ARTHROPLASTY Right 09/29/2018   Procedure: TOTAL HIP ARTHROPLASTY ANTERIOR APPROACH;  Surgeon: Durene Romans, MD;  Location: [facility] ORS;  Service: Orthopedics;  Laterality: Right;  74    Social History:   reports that he quit smoking about 46 years ago. His smoking use included cigarettes. He started smoking about 55 years ago. He has a 18 pack-year smoking history. He has never used smokeless tobacco. He reports that he does not drink alcohol and does not use drugs.  No Known Allergies  Family History  Problem Relation Age of Onset   Dementia Mother    Congestive Heart Failure Father    CAD Father        quad bypass at age 24   CAD Brother 67     Prior to Admission medications   Medication Sig Start Date End Date Taking? Authorizing Provider  acetaminophen (TYLENOL) 325 MG tablet Take 2 tablets (650 mg total) by mouth every 6 (six) hours as needed for mild pain or fever. 04/04/23 04/03/24  Pokhrel, Rebekah Chesterfield, MD  cephALEXin (KEFLEX) 500 MG capsule Take 1 capsule (500 mg total) by mouth 4 (four) times daily for 5 days. 04/04/23 04/09/23  Pokhrel, Rebekah Chesterfield, MD  cetirizine (ZYRTEC) 10 MG tablet Take 10 mg by mouth daily.    [provider]  diclofenac Sodium (VOLTAREN) 1 % GEL Apply 1 Application topically 2 (two) times daily as needed (joint pain).    [provider]  doxycycline (VIBRA-TABS) 100 MG tablet Take 1 tablet (100 mg total) by mouth 2 (two) times daily for 5 days. 04/04/23 04/09/23  Pokhrel, Rebekah Chesterfield, MD  fluticasone (FLONASE) 50 MCG/ACT nasal spray Place 1 spray into both nostrils daily as needed for allergies or rhinitis.    [provider]  methotrexate (RHEUMATREX) 7.5 MG tablet Take 7.5 mg by mouth once a week. Caution" Chemotherapy. Protect from light.    [provider]  metoprolol tartrate (LOPRESSOR) 25 MG tablet Take 0.5 tablets (12.5  mg total) by mouth 2 (two) times daily. 04/04/23 07/03/23  Pokhrel, Rebekah Chesterfield, MD  montelukast (SINGULAIR) 10 MG tablet Take 10 mg by mouth at bedtime.    [provider]  rivaroxaban (XARELTO) 20 MG TABS tablet Take 1 tablet (20 mg total) by mouth daily with supper. 04/04/23 07/03/23  Pokhrel, Rebekah Chesterfield, MD  sildenafil (VIAGRA) 100 MG tablet Take 100 mg by mouth daily as needed for erectile dysfunction.    [provider]  simvastatin (ZOCOR) 40 MG tablet Take 40 mg by mouth daily.    [provider]  triamterene-hydrochlorothiazide (DYAZIDE) 37.5-25 MG capsule Take 1 capsule by mouth every morning. 07/05/21   [provider]    Physical Exam: Vitals:   04/06/23 1800 04/06/23 2200 04/06/23 2233 04/06/23 2310  BP: (!) 144/83  132/73  (!) 135/114  Pulse: 65 70  76  Resp: 17 18    Temp:   98.5 F (36.9 C) 98 F (36.7 C)  TempSrc:   Oral Oral  SpO2: 96% 96%  99%  Weight:      Height:         Constitutional: NAD, calm  Eyes: PERTLA, lids and conjunctivae normal ENMT: Mucous membranes are moist. Posterior pharynx clear of any exudate or lesions.   Neck: supple, no masses  Respiratory: no wheezing, no crackles. No accessory muscle use.  Cardiovascular: S1 & S2 heard, regular rate and rhythm. No JVD. Abdomen: No distension, no tenderness, soft. Bowel sounds active.  Musculoskeletal: no clubbing / cyanosis. No joint deformity upper and lower extremities.   Skin: Left lower leg is edematous with erythema, heat, and tenderness; there is crusted lesion at lateral aspect near ankle. Skin otherwise warm, dry, well-perfused. Neurologic: CN 2-12 grossly intact. Moving all extremities. Alert and oriented.  Psychiatric: Pleasant. Cooperative.    Labs and Imaging on Admission: I have personally reviewed following labs and imaging studies  CBC: Recent Labs  Lab 04/01/23 0955 04/02/23 0056 04/03/23 0622 04/04/23 0308 04/06/23 1725  WBC 10.8* 7.9 6.0 6.2 8.5   NEUTROABS 9.0*  --   --   --  5.8  HGB 14.2 12.1* 10.9* 10.3* 13.2  HCT 42.8 35.3* 32.8* 31.7* 39.5  MCV 83.6 82.1 82.8 84.8 84.2  PLT 189 167 171 183 354   Basic Metabolic Panel: Recent Labs  Lab 04/01/23 2211 04/02/23 0056 04/03/23 0622 04/04/23 0308 04/06/23 1725  NA 133* 131* 137 136 140  K 3.9 3.3* 3.7 3.5 3.8  CL 96* 99 107 106 104  CO2 24 23 21* 21* 26  GLUCOSE 146* 160* 120* 144* 116*  BUN 21 20 12 11 20   CREATININE 1.53* 1.38* 0.92 0.92 0.78  CALCIUM 8.4* 7.9* 8.1* 8.0* 9.4  MG 2.1 1.9 2.0 1.8  --   PHOS 1.4* 1.6*  --   --   --    GFR: Estimated Creatinine Clearance: 76 mL/min (by C-G formula based on SCr of 0.78 mg/dL). Liver Function Tests: Recent Labs  Lab 04/01/23 0955 04/02/23 0056 04/06/23 1725  AST 37 28 25  ALT 19 20 31   ALKPHOS 61 57 74  BILITOT 1.2 0.9 0.3  PROT 7.4 5.8* 6.9  ALBUMIN 3.9 2.6* 3.7   No results for input(s): "LIPASE", "AMYLASE" in the last 168 hours. No results for input(s): "AMMONIA" in the last 168 hours. Coagulation Profile: Recent Labs  Lab 04/01/23 2211  INR 1.3*   Cardiac Enzymes: Recent Labs  Lab 04/01/23 2211  CKTOTAL 156   BNP (last 3 results) No results for input(s): "PROBNP" in the last 8760 hours. HbA1C: No results for input(s): "HGBA1C" in the last 72 hours. CBG: No results for input(s): "GLUCAP" in the last 168 hours. Lipid Profile: No results for input(s): "CHOL", "HDL", "LDLCALC", "TRIG", "CHOLHDL", "LDLDIRECT" in the last 72 hours. Thyroid Function Tests: No results for input(s): "TSH", "T4TOTAL", "FREET4", "T3FREE", "THYROIDAB" in the last 72 hours. Anemia Panel: No results for input(s): "VITAMINB12", "FOLATE", "FERRITIN", "TIBC", "IRON", "RETICCTPCT" in the last 72 hours. Urine analysis:    Component Value Date/Time   COLORURINE YELLOW 04/06/2023 1725   APPEARANCEUR CLEAR 04/06/2023 1725   LABSPEC 1.017 04/06/2023 1725   PHURINE 6.5 04/06/2023 1725   GLUCOSEU NEGATIVE 04/06/2023 1725    HGBUR NEGATIVE 04/06/2023 1725   BILIRUBINUR NEGATIVE 04/06/2023 1725   KETONESUR  NEGATIVE 04/06/2023 1725   PROTEINUR NEGATIVE 04/06/2023 1725   NITRITE NEGATIVE 04/06/2023 1725   LEUKOCYTESUR NEGATIVE 04/06/2023 1725   Sepsis Labs: @LABRCNTIP (procalcitonin:4,lacticidven:4) ) Recent Results (from the past 240 hour(s))  Culture, blood (Routine X 2) w Reflex to ID Panel     Status: None   Collection Time: 04/01/23  9:55 AM   Specimen: Right Antecubital; Blood  Result Value Ref Range Status   Specimen Description   Final    RIGHT ANTECUBITAL BLOOD Performed at [facility], [address], [place], [place] [address]    Special Requests   Final    BOTTLES DRAWN AEROBIC AND ANAEROBIC Blood Culture adequate volume Performed at [facility], [address] [place], [place] [address]    Culture   Final    NO GROWTH 5 DAYS Performed at [facility], [address] [place], [place] [address]    Report Status 04/06/2023 FINAL  Final  Culture, blood (Routine X 2) w Reflex to ID Panel     Status: None   Collection Time: 04/01/23 10:00 AM   Specimen: Left Antecubital; Blood  Result Value Ref Range Status   Specimen Description   Final    LEFT ANTECUBITAL BLOOD Performed at [facility], [address], [place], [place] [address]    Special Requests   Final    BOTTLES DRAWN AEROBIC AND ANAEROBIC Blood Culture adequate volume Performed at [facility], [address] [place], [place] [address]    Culture   Final    NO GROWTH 5 DAYS Performed at [facility], [address] [place], [place] [address]    Report Status 04/06/2023 FINAL  Final  Urine Culture     Status: None   Collection Time: 04/01/23 11:50 AM   Specimen: Urine, Clean Catch  Result Value Ref Range Status   Specimen Description   Final    URINE, CLEAN CATCH Performed at [facility], [address], [place], [place] [address]     Special Requests   Final    NONE Performed at [facility], [address], [place], [place] [address]    Culture   Final    NO GROWTH Performed at [facility], [address] [place], [place] [address]    Report Status 04/02/2023 FINAL  Final  SARS Coronavirus 2 by RT PCR (hospital order, performed in [facility] hospital lab) *cepheid single result test* Nasal Mucosa     Status: None   Collection Time: 04/01/23  9:11 PM   Specimen: Nasal Mucosa; Nasal Swab  Result Value Ref Range Status   SARS Coronavirus 2 by RT PCR NEGATIVE NEGATIVE Final    Comment: Performed at [facility], [address] [place], [place] [address]  MRSA Next Gen by PCR, Nasal     Status: None   Collection Time: 04/01/23  9:11 PM   Specimen: Nasal Mucosa; Nasal Swab  Result Value Ref Range Status   MRSA by PCR Next Gen NOT DETECTED NOT DETECTED Final    Comment: (NOTE) The GeneXpert MRSA Assay (FDA approved for NASAL specimens only), is one component of a comprehensive MRSA colonization surveillance program. It is not intended to diagnose MRSA infection nor to guide or monitor treatment for MRSA infections. Test performance is not FDA approved in patients less than 2 years old. Performed at [facility], [address], [place], [place] [address]  Radiological Exams on Admission: DG Tibia/Fibula Left  Result Date: 04/06/2023 CLINICAL DATA:  Infection. EXAM: LEFT TIBIA AND FIBULA - 2 VIEW COMPARISON:  X-ray 04/01/23 FINDINGS: No significant interval change. No fracture or dislocation. Preserved bone mineralization and adjacent joint spaces. Well corticated density seen about the knee joint at the edge of the imaging field. No erosive changes identified. If there is further concern of bone infection, bone scan or MRI may be useful for further sensitivity. IMPRESSION: No acute osseous abnormality. Electronically Signed   By: Karen Kays M.D.   On: 04/06/2023 19:07     Assessment/Plan   1. Left leg cellulitis  - Not septic on admission but worsening despite outpatient antibiotics   - Check CT, treat with Rocephin and doxycycline, follow cultures and clinical response to treatment  2. PAF  - Continue Xarelto and metoprolol    3. HLD  - Continue Zocor    4. Aortic stenosis  - Followed by cardiology, Dr. Excell Seltzer   5. Rheumatoid arthritis  - Managed with methotrexate     DVT prophylaxis: Xarelto  Code Status: Full  Level of Care: Level of care: Med-Surg Family Communication: None present  Disposition Plan:  Patient is from: home  Anticipated d/c is to: Home Anticipated d/c date is: Possibly as early as 04/07/23  Patient currently: Pending improvement in cellulitis  Consults called: None  Admission status: Observation     Briscoe Deutscher, MD [facility]  04/06/2023, 11:42 PM  \

## 2023-04-06 NOTE — ED Triage Notes (Signed)
Pt via pov from home with left leg swelling, redness, weeping. Pt was recently admitted to hospital for afib (also had redness and swelling when he was admitted; has gotten worse in last 2 days) and received antibiotics (released Friday). Pt reports that he was recently in [place] and cut his leg on a rock. Pt alert & oriented, nad noted.

## 2023-04-07 ENCOUNTER — Inpatient Hospital Stay (HOSPITAL_COMMUNITY): Payer: Medicare Other

## 2023-04-07 ENCOUNTER — Observation Stay (HOSPITAL_COMMUNITY): Payer: Medicare Other

## 2023-04-07 DIAGNOSIS — E7800 Pure hypercholesterolemia, unspecified: Secondary | ICD-10-CM | POA: Diagnosis present

## 2023-04-07 DIAGNOSIS — Z7901 Long term (current) use of anticoagulants: Secondary | ICD-10-CM | POA: Diagnosis not present

## 2023-04-07 DIAGNOSIS — Z8249 Family history of ischemic heart disease and other diseases of the circulatory system: Secondary | ICD-10-CM | POA: Diagnosis not present

## 2023-04-07 DIAGNOSIS — I350 Nonrheumatic aortic (valve) stenosis: Secondary | ICD-10-CM | POA: Diagnosis present

## 2023-04-07 DIAGNOSIS — I7090 Unspecified atherosclerosis: Secondary | ICD-10-CM | POA: Diagnosis not present

## 2023-04-07 DIAGNOSIS — L0390 Cellulitis, unspecified: Secondary | ICD-10-CM | POA: Diagnosis present

## 2023-04-07 DIAGNOSIS — Z96641 Presence of right artificial hip joint: Secondary | ICD-10-CM | POA: Diagnosis present

## 2023-04-07 DIAGNOSIS — M1712 Unilateral primary osteoarthritis, left knee: Secondary | ICD-10-CM | POA: Diagnosis not present

## 2023-04-07 DIAGNOSIS — Z79899 Other long term (current) drug therapy: Secondary | ICD-10-CM | POA: Diagnosis not present

## 2023-04-07 DIAGNOSIS — H918X3 Other specified hearing loss, bilateral: Secondary | ICD-10-CM | POA: Diagnosis present

## 2023-04-07 DIAGNOSIS — I480 Paroxysmal atrial fibrillation: Secondary | ICD-10-CM | POA: Diagnosis present

## 2023-04-07 DIAGNOSIS — R609 Edema, unspecified: Secondary | ICD-10-CM | POA: Diagnosis not present

## 2023-04-07 DIAGNOSIS — I2510 Atherosclerotic heart disease of native coronary artery without angina pectoris: Secondary | ICD-10-CM | POA: Diagnosis present

## 2023-04-07 DIAGNOSIS — I6523 Occlusion and stenosis of bilateral carotid arteries: Secondary | ICD-10-CM | POA: Diagnosis present

## 2023-04-07 DIAGNOSIS — Z981 Arthrodesis status: Secondary | ICD-10-CM | POA: Diagnosis not present

## 2023-04-07 DIAGNOSIS — L03116 Cellulitis of left lower limb: Secondary | ICD-10-CM | POA: Diagnosis present

## 2023-04-07 DIAGNOSIS — M069 Rheumatoid arthritis, unspecified: Secondary | ICD-10-CM | POA: Diagnosis present

## 2023-04-07 DIAGNOSIS — M7989 Other specified soft tissue disorders: Secondary | ICD-10-CM | POA: Diagnosis not present

## 2023-04-07 DIAGNOSIS — R059 Cough, unspecified: Secondary | ICD-10-CM | POA: Diagnosis present

## 2023-04-07 DIAGNOSIS — I10 Essential (primary) hypertension: Secondary | ICD-10-CM | POA: Diagnosis present

## 2023-04-07 DIAGNOSIS — N400 Enlarged prostate without lower urinary tract symptoms: Secondary | ICD-10-CM | POA: Diagnosis present

## 2023-04-07 DIAGNOSIS — Z87891 Personal history of nicotine dependence: Secondary | ICD-10-CM | POA: Diagnosis not present

## 2023-04-07 DIAGNOSIS — R140 Abdominal distension (gaseous): Secondary | ICD-10-CM | POA: Diagnosis present

## 2023-04-07 LAB — SEDIMENTATION RATE: Sed Rate: 73 mm/hr — ABNORMAL HIGH (ref 0–16)

## 2023-04-07 LAB — CK: Total CK: 37 U/L — ABNORMAL LOW (ref 49–397)

## 2023-04-07 LAB — VAS US ABI WITH/WO TBI
Left ABI: 1.27
Right ABI: 1.31

## 2023-04-07 LAB — C-REACTIVE PROTEIN: CRP: 3.9 mg/dL — ABNORMAL HIGH (ref <1.0)

## 2023-04-07 MED ORDER — GUAIFENESIN ER 600 MG PO TB12: 600.0000 mg | ORAL_TABLET | Freq: Two times a day (BID) | ORAL | Status: DC

## 2023-04-07 MED ORDER — LORATADINE 10 MG PO TABS
10.0000 mg | ORAL_TABLET | Freq: Every day | ORAL | Status: DC
  Administered 2023-04-07 – 2023-04-09 (×3): 10 mg via ORAL
  Filled 2023-04-07 (×3): qty 1

## 2023-04-07 MED ORDER — DM-GUAIFENESIN ER 30-600 MG PO TB12
1.0000 | ORAL_TABLET | Freq: Two times a day (BID) | ORAL | Status: DC
  Administered 2023-04-07: 1 via ORAL
  Filled 2023-04-07: qty 1

## 2023-04-07 MED ORDER — IOHEXOL 350 MG/ML SOLN
75.0000 mL | Freq: Once | INTRAVENOUS | Status: AC | PRN
  Administered 2023-04-07: 75 mL via INTRAVENOUS

## 2023-04-07 MED ORDER — ORAL CARE MOUTH RINSE: 15.0000 mL | OROMUCOSAL | Status: DC | PRN

## 2023-04-07 MED ORDER — RIVAROXABAN 20 MG PO TABS
20.0000 mg | ORAL_TABLET | Freq: Every day | ORAL | Status: DC
  Administered 2023-04-07 – 2023-04-08 (×3): 20 mg via ORAL
  Filled 2023-04-07 (×3): qty 1

## 2023-04-07 MED ORDER — VANCOMYCIN HCL 1500 MG/300ML IV SOLN
1500.0000 mg | INTRAVENOUS | Status: DC
  Administered 2023-04-07: 1500 mg via INTRAVENOUS
  Filled 2023-04-07 (×2): qty 300

## 2023-04-07 MED ORDER — SODIUM CHLORIDE 0.9 % IV SOLN
2.0000 g | INTRAVENOUS | Status: DC
  Administered 2023-04-08 – 2023-04-09 (×2): 2 g via INTRAVENOUS
  Filled 2023-04-07 (×2): qty 20

## 2023-04-07 MED ORDER — METOPROLOL TARTRATE 12.5 MG HALF TABLET
12.5000 mg | ORAL_TABLET | Freq: Two times a day (BID) | ORAL | Status: DC
  Administered 2023-04-07 – 2023-04-09 (×5): 12.5 mg via ORAL
  Filled 2023-04-07 (×5): qty 1

## 2023-04-07 NOTE — Progress Notes (Signed)
[facility] Progress Note Patient: Cody Mcconnell DOB: 1946-07-14 DOA: 04/06/2023  DOS: the patient was seen and examined on 04/07/2023  Brief hospital course: PMH of rheumatoid arthritis, HTN, HLD, aortic stenosis, PAF on Xarelto, presented to the hospital with complaints of worsening swelling and redness of his left leg. Hospitalized between 7/23 - 7/26 for cellulitis and A-fib with RVR. Treated with broad-spectrum IV robotics and was discharged home on oral doxycycline and Keflex. After going home he continues to have worsening swelling of his left leg and therefore he came to the hospital. No new fever or chills reported by the patient.  Assessment and Plan: Cellulitis of the leg. Recently went for a trip to [place], had some injury from some bush and had bleeding and went to the ocean for swimming. On return developed bronchitis and later developed cellulitis and encephalopathy. Admitted in the hospital.  Treated with IV vancomycin and cefepime. Cellulitis improved.  Blood cultures negative.  Recommended outpatient follow-up. Now returns back. Will continue with IV vancomycin and IV ceftriaxone which he seems to be responding. Recommend to continue elevation. ABI unremarkable. Repeat DVT also negative for Doppler. Monitor for now.  Paroxysmal A-fib. History of moderate aortic stenosis. Rate controlled for now with sinus rhythm. On Xarelto.  Rheumatoid arthritis. On methotrexate. Has not resumed it yet. Will monitor.  Moderate aortic valve stenosis. Outpatient follow-up with Dr. Excell Seltzer recommended.  Cough. Reports that he has bronchitis ongoing since beginning of this month. CTA negative for any acute abnormality recently. Monitor.  HLD. Continue statin.  Abdominal distention. Patient reports subjective feeling of abdominal distention although no nausea no vomiting no constipation.  Had a BM on 7/29. No tenderness at the time of my examination  as well. For now we will monitor.   Subjective: Pain improving.  Swelling improving.  No nausea no vomiting no fever no chills.  No diarrhea.  Physical Exam: General: in Mild distress, No Rash Cardiovascular: S1 and S2 Present, No Murmur Respiratory: Good respiratory effort, Bilateral Air entry present. No Crackles, No wheezes Abdomen: Bowel Sound present, No tenderness Extremities: No edema on right.  Left leg swollen, compared to picture yesterday swelling appears to be improving.  Neuro: Alert and oriented x3, no new focal deficit  Data Reviewed: I have Reviewed nursing notes, Vitals, and Lab results. Since last encounter, pertinent lab results CBC and BMP   . I have ordered test including CBC, CRP, BMP, ESR.  . I have ordered imaging ABI and Doppler  .   Disposition: Status is: Inpatient Remains inpatient appropriate because: Need for IV antibiotics  rivaroxaban (XARELTO) tablet 20 mg   Family Communication: Discussed with brother on the phone. Level of care: Med-Surg   Vitals:   04/06/23 2310 04/07/23 0257 04/07/23 0812 04/07/23 1652  BP: (!) 135/114 (!) 147/91 (!) 142/74 137/70  Pulse: 76 65 82 70  Resp:   14 18  Temp: 98 F (36.7 C) 98.1 F (36.7 C) 98.3 F (36.8 C) 98.2 F (36.8 C)  TempSrc: Oral Oral Oral Oral  SpO2: 99% 95% 99% 100%  Weight:      Height:         Author: Lynden Oxford, MD 04/07/2023 6:33 PM  Please look on [email or web address] to find out who is on call.

## 2023-04-07 NOTE — Progress Notes (Signed)
Left lower extremity venous duplex completed. Please see CV Procedures for preliminary results.  Shona Simpson, RVT 04/07/23 3:10 PM

## 2023-04-07 NOTE — Progress Notes (Signed)
ABI study completed. Please see CV Procedures for preliminary results.  Shona Simpson, RVT 04/07/23 3:10 PM

## 2023-04-07 NOTE — Progress Notes (Signed)
Pharmacy Antibiotic Note  Cody Mcconnell is a 77 y.o. male admitted on 04/06/2023 with cellulitis.  Pharmacy has been consulted for Vancomycin dosing.  Patient received vancomycin 1500mg  IV total loading dose on 7/28 in the ED.  WBC 7.8, Temp 98.3 SCr 0.89  Plan: Start Vancomycin 1500mg  IV q24h (eAUC ~442)    > Goal AUC 400-550    > Check vancomycin levels at steady state  Continue ceftriaxone 2g IV q24h per MD Monitor daily CBC, temp, SCr, and for clinical signs of improvement  F/u cultures and de-escalate antibiotics as able   Height: 5\' 8"  (172.7 cm) Weight: 77.1 kg (169 lb 15.6 oz) IBW/kg (Calculated) : 68.4  Temp (24hrs), Avg:98.2 F (36.8 C), Min:98 F (36.7 C), Max:98.5 F (36.9 C)  Recent Labs  Lab 04/01/23 0955 04/01/23 1155 04/01/23 2211 04/02/23 0056 04/03/23 0622 04/04/23 0308 04/06/23 1725 04/06/23 1849 04/07/23 0556  WBC 10.8*  --   --  7.9 6.0 6.2 8.5  --  7.8  CREATININE 1.46*  --    < > 1.38* 0.92 0.92 0.78  --  0.89  LATICACIDVEN 1.6 1.2  --   --   --   --  0.9 0.7  --    < > = values in this interval not displayed.    Estimated Creatinine Clearance: 68.3 mL/min (by C-G formula based on SCr of 0.89 mg/dL).    No Known Allergies  Antimicrobials this admission: Cefepime 7/28 x1  Vancomycin 7/28 >>  Doxycycline 7/28 >> 7/29 (x2 doses) Ceftriaxone 7/29 >>   Dose adjustments this admission: N/A  Microbiology results: 7/28 BCx x2: sent   Thank you for allowing pharmacy to be a part of this patient's care.  Wilburn Cornelia, PharmD, BCPS Clinical Pharmacist 04/07/2023 2:16 PM   Please refer to [facility] for pharmacy phone number

## 2023-04-07 NOTE — TOC Initial Note (Signed)
Transition of Care [facility]) - Initial/Assessment Note    Patient Details  Name: Cody Mcconnell MRN: [id number] Date of Birth: 07-Dec-1945  [facility] CM/SW Contact:    Kingsley Plan, RN Phone Number: 04/07/2023, 2:37 PM  Clinical Narrative:                 Spoke to patient and wife at bedside.   Patient from home   Has Medicare insurance.   Did not use any DME prior to hospitalization   P{CP Linus Galas  No TOC needs identified at this time   Expected Discharge Plan: Home/Self Care Barriers to Discharge: Continued Medical Work up   Patient Goals and CMS Choice Patient states their goals for this hospitalization and ongoing recovery are:: to return to home          Expected Discharge Plan and Services In-house Referral: NA   Post Acute Care Choice: NA                     DME Agency: NA       HH Arranged: NA          Prior Living Arrangements/Services     Patient language and need for interpreter reviewed:: Yes Do you feel safe going back to the place where you live?: Yes      Need for Family Participation in Patient Care: Yes (Comment) Care giver support system in place?: Yes (comment)   Criminal Activity/Legal Involvement Pertinent to Current Situation/Hospitalization: No - Comment as needed  Activities of Daily Living Home Assistive Devices/Equipment: None ADL Screening (condition at time of admission) Patient's cognitive ability adequate to safely complete daily activities?: Yes Is the patient deaf or have difficulty hearing?: Yes Does the patient have difficulty seeing, even when wearing glasses/contacts?: No Does the patient have difficulty concentrating, remembering, or making decisions?: No Patient able to express need for assistance with ADLs?: No Does the patient have difficulty dressing or bathing?: No Independently performs ADLs?: Yes (appropriate for developmental age) Does the patient have difficulty walking or  climbing stairs?: No Weakness of Legs: Left Weakness of Arms/Hands: None  Permission Sought/Granted   Permission granted to share information with : Yes, Verbal Permission Granted  Share Information with NAME: wife [place]           Emotional Assessment Appearance:: Appears stated age Attitude/Demeanor/Rapport: Engaged Affect (typically observed): Accepting Orientation: : Oriented to Self, Oriented to Place, Oriented to  Time, Oriented to Situation Alcohol / Substance Use: Not Applicable Psych Involvement: No (comment)  Admission diagnosis:  Cellulitis [L03.90] Cellulitis, unspecified cellulitis site [L03.90] Patient Active Problem List   Diagnosis Date Noted   Cellulitis 04/07/2023   Hypophosphatemia 04/02/2023   Cellulitis of left lower extremity 04/01/2023   Atrial fibrillation with RVR (HCC) 04/01/2023   Rheumatoid arthritis (HCC) 04/01/2023   Bronchitis 04/01/2023   Hyperlipidemia 04/01/2023   AKI (acute kidney injury) (HCC) 04/01/2023   Hypokalemia 04/01/2023   NSTEMI (non-ST elevated myocardial infarction) (HCC) 04/01/2023   Lumbar stenosis with neurogenic claudication 08/14/2021   Spondylolisthesis of lumbar region 06/29/2020   Overweight (BMI 25.0-29.9) 09/30/2018   S/P right THA, AA 09/29/2018   Osteoarthritis of right hip 09/21/2018   Paroxysmal atrial fibrillation (HCC) 06/05/2018   Coronary artery calcification seen on CT scan 06/05/2018   Carotid atherosclerosis, bilateral 04/30/2018   Prediabetes 04/30/2018   Mild aortic stenosis 04/30/2018   Acute right-sided low back pain with right-sided sciatica 02/04/2018   Lumbar  herniated disc 01/30/2018   Aortic valve stenosis 12/19/2017   Encounter for screening colonoscopy 10/17/2017   Benign prostatic hyperplasia with urinary obstruction 08/30/2016   Elevated prostate specific antigen (PSA) 08/30/2016   History of lumbar laminectomy for spinal cord decompression 11/30/2013   S/P lumbar laminectomy 11/30/2013    Spinal stenosis of lumbar region with neurogenic claudication 09/20/2013   Right hip pain 09/20/2013   Low back pain with sciatica 06/02/2012   PCP:  Linus Galas, NP Pharmacy:   [id number] [place], [place] - [address] AT [facility] OF [address] & [facility] [address] [place] [address] Phone: [phone] Fax: [phone]     Social Determinants of Health (SDOH) Social History: SDOH Screenings   Food Insecurity: No Food Insecurity (04/06/2023)  Housing: Low Risk  (04/06/2023)  Transportation Needs: No Transportation Needs (04/06/2023)  Utilities: Not At Risk (04/06/2023)  Alcohol Screen: Low Risk  (04/02/2023)  Depression (PHQ2-9): Low Risk  (11/14/2022)  Financial Resource Strain: Low Risk  (12/24/2021)   Received from [facility]  Physical Activity: Sufficiently Active (04/02/2023)  Social Connections: Moderately Isolated (04/02/2023)  Stress: No Stress Concern Present (04/02/2023)  Tobacco Use: Medium Risk (04/06/2023)  Health Literacy: Adequate Health Literacy (04/02/2023)   SDOH Interventions:     Readmission Risk Interventions     No data to display

## 2023-04-07 NOTE — Hospital Course (Addendum)
Brief hospital course: PMH of rheumatoid arthritis, HTN, HLD, aortic stenosis, PAF on Xarelto, presented to the hospital with complaints of worsening swelling and redness of his left leg. Hospitalized between 7/23 - 7/26 for cellulitis and A-fib with RVR. Treated with broad-spectrum IV robotics and was discharged home on oral doxycycline and Keflex. After going home he continues to have worsening swelling of his left leg and therefore he came to the hospital. No new fever or chills reported by the patient.  Assessment and Plan: Recurrent cellulitis of the left leg. Recently went for a trip to [place], had some injury from some bush and had bleeding and went to the ocean for swimming. On return developed bronchitis and later developed cellulitis and encephalopathy. Admitted in the hospital.  Treated with IV vancomycin and cefepime. Cellulitis improved.  Blood cultures negative.  Recommended outpatient follow-up. Now returns back. Initially treated with IV vancomycin and ceftriaxone. Now on ceftriaxone and oral doxycycline. Blood cultures are negative. Doppler negative for DVT. ABI also unremarkable. CRP improving. Monitor overnight for stability.  Paroxysmal A-fib. History of moderate aortic stenosis. Rate controlled for now with sinus rhythm. On Xarelto.  Follows up with cardiology.  Rheumatoid arthritis. On methotrexate. Has not resumed it yet.  Continue to hold while on antibiotic.  Cough.  Recent bronchitis. Reports that he has bronchitis ongoing since beginning of this month. CTA negative for any acute abnormality recently. Monitor.  HLD. Continue statin.  Abdominal distention. Patient reports subjective feeling of abdominal distention although no nausea no vomiting no constipation.  Last BM on 7/29. No tenderness at the time of my examination as well. For now we will monitor.

## 2023-04-08 ENCOUNTER — Encounter: Payer: Self-pay | Admitting: Cardiovascular Disease

## 2023-04-08 DIAGNOSIS — L03116 Cellulitis of left lower limb: Secondary | ICD-10-CM | POA: Diagnosis not present

## 2023-04-08 MED ORDER — DOXYCYCLINE HYCLATE 100 MG PO TABS
100.0000 mg | ORAL_TABLET | Freq: Two times a day (BID) | ORAL | Status: DC
  Administered 2023-04-08 – 2023-04-09 (×2): 100 mg via ORAL
  Filled 2023-04-08 (×2): qty 1

## 2023-04-08 NOTE — Progress Notes (Signed)
[facility] Progress Note Patient: Cody Mcconnell ZOX:[id number] DOB: [birth year] DOA: 04/06/2023  DOS: the patient was seen and examined on 04/08/2023  Brief hospital course: PMH of rheumatoid arthritis, HTN, HLD, aortic stenosis, PAF on Xarelto, presented to the hospital with complaints of worsening swelling and redness of his left leg. Hospitalized between 7/23 - 7/26 for cellulitis and A-fib with RVR. Treated with broad-spectrum IV robotics and was discharged home on oral doxycycline and Keflex. After going home he continues to have worsening swelling of his left leg and therefore he came to the hospital. No new fever or chills reported by the patient.  Assessment and Plan: Recurrent cellulitis of the left leg. Recently went for a trip to [place], had some injury from some bush and had bleeding and went to the ocean for swimming. On return developed bronchitis and later developed cellulitis and encephalopathy. Admitted in the hospital.  Treated with IV vancomycin and cefepime. Cellulitis improved.  Blood cultures negative.  Recommended outpatient follow-up. Now returns back. Initially treated with IV vancomycin and ceftriaxone. Now on ceftriaxone and oral doxycycline. Blood cultures are negative. Doppler negative for DVT. ABI also unremarkable. CRP improving. Monitor overnight for stability.  Paroxysmal A-fib. History of moderate aortic stenosis. Rate controlled for now with sinus rhythm. On Xarelto.  Follows up with cardiology.  Rheumatoid arthritis. On methotrexate. Has not resumed it yet.  Continue to hold while on antibiotic.  Cough.  Recent bronchitis. Reports that he has bronchitis ongoing since beginning of this month. CTA negative for any acute abnormality recently. Monitor.  HLD. Continue statin.  Abdominal distention. Patient reports subjective feeling of abdominal distention although no nausea no vomiting no constipation.  Last BM on 7/29. No  tenderness at the time of my examination as well. For now we will monitor.   Subjective: Improving leg swelling and pain.  No nausea no vomiting.  No fever no chills.  Physical Exam: General: in Mild distress, No Rash Cardiovascular: S1 and S2 Present, No Murmur Respiratory: Good respiratory effort, Bilateral Air entry present. No Crackles, No wheezes Abdomen: Bowel Sound present, No tenderness Extremities: Improving left leg edema Neuro: Alert and oriented x3, no new focal deficit   Data Reviewed: I have Reviewed nursing notes, Vitals, and Lab results. Since last encounter, pertinent lab results CBC and BMP   . I have ordered test including CBC and CRP  .   Disposition: Status is: Inpatient Remains inpatient appropriate because: Monitor overnight for stability on antibiotics.  Likely home on 7/31.  rivaroxaban (XARELTO) tablet 20 mg   Family Communication: No one at bedside.  Discussed with brother on the phone on 7/29. Level of care: Med-Surg   Vitals:   04/07/23 2315 04/08/23 0454 04/08/23 0825 04/08/23 1440  BP: 105/64 (!) 140/83 111/66 116/69  Pulse: 74 (!) 58 81 74  Resp: 18 16 18  (!) 21  Temp: 98.1 F (36.7 C) 98.3 F (36.8 C) 97.9 F (36.6 C) 98.4 F (36.9 C)  TempSrc: Oral Oral  Oral  SpO2: 98%  96% 98%  Weight:      Height:         Author: Lynden Oxford, MD 04/08/2023 6:44 PM  Please look on [email or web address] to find out who is on call.

## 2023-04-09 DIAGNOSIS — L03116 Cellulitis of left lower limb: Secondary | ICD-10-CM | POA: Diagnosis not present

## 2023-04-09 MED ORDER — CEFPODOXIME PROXETIL 200 MG PO TABS: 200.0000 mg | ORAL_TABLET | Freq: Two times a day (BID) | ORAL | 0 refills | Status: AC

## 2023-04-09 MED ORDER — TRIAMTERENE-HCTZ 37.5-25 MG PO CAPS: 1.0000 | ORAL_CAPSULE | Freq: Every morning | ORAL | 0 refills | Status: DC

## 2023-04-09 MED ORDER — DOXYCYCLINE HYCLATE 100 MG PO TABS
100.0000 mg | ORAL_TABLET | Freq: Two times a day (BID) | ORAL | 0 refills | Status: AC
Start: 2023-04-09 — End: 2023-04-16

## 2023-04-09 MED ORDER — MONTELUKAST SODIUM 10 MG PO TABS: 10.0000 mg | ORAL_TABLET | Freq: Every day | ORAL | 0 refills | Status: AC | PRN

## 2023-04-09 NOTE — Discharge Summary (Signed)
[facility]  Physician Discharge Summary   Patient ID: Cody Mcconnell MRN: [id number] DOB/AGE: 09-11-1945 77 y.o.  Admit date: 04/06/2023 Discharge date: 04/09/2023    PCP: Linus Galas, NP  DISCHARGE DIAGNOSES:    Cellulitis of left lower extremity   Aortic valve stenosis   Paroxysmal atrial fibrillation (HCC)   Coronary artery calcification seen on CT scan   Rheumatoid arthritis (HCC)   RECOMMENDATIONS FOR OUTPATIENT FOLLOW UP: Follow-up with PCP within 1 week   Home Health: None Equipment/Devices: None  CODE STATUS: Full code  DISCHARGE CONDITION: fair  Diet recommendation: As before  INITIAL HISTORY: PMH of rheumatoid arthritis, HTN, HLD, aortic stenosis, PAF on Xarelto, presented to the hospital with complaints of worsening swelling and redness of his left leg. Hospitalized between 7/23 - 7/26 for cellulitis and A-fib with RVR. Treated with broad-spectrum IV robotics and was discharged home on oral doxycycline and Keflex. After going home he continues to have worsening swelling of his left leg and therefore he came to the hospital. No new fever or chills reported by the patient.   HOSPITAL COURSE:   Recurrent cellulitis of the left leg. Recently went for a trip to [place], had some injury from some bush and had bleeding and went to the ocean for swimming. On return developed bronchitis and later developed cellulitis and encephalopathy. Treated with IV vancomycin and cefepime. Cellulitis improved.  Blood cultures negative.  Recommended outpatient follow-up. Had to come back to the hospital due to worsening symptoms and has lower extremity.  Again started back on IV antibiotics.  Doppler study negative for DVT.  ABIs unremarkable.  CRP improving.  Swelling is improved.  Erythema is better.  Transition to doxycycline and cefpodoxime at discharge.     Paroxysmal A-fib. History of moderate aortic stenosis. Rate controlled for now with sinus rhythm. On  Xarelto.  Follows up with cardiology.   Rheumatoid arthritis. On methotrexate.   Cough.  Recent bronchitis. CTA negative for any acute abnormality recently.   HLD. Continue statin.   Patient is stable.  Okay for discharge home today.   PERTINENT LABS:  The results of significant diagnostics from this hospitalization (including imaging, microbiology, ancillary and laboratory) are listed below for reference.    Microbiology: Recent Results (from the past 240 hour(s))  Culture, blood (Routine X 2) w Reflex to ID Panel     Status: None   Collection Time: 04/01/23  9:55 AM   Specimen: Right Antecubital; Blood  Result Value Ref Range Status   Specimen Description   Final    RIGHT ANTECUBITAL BLOOD Performed at [facility], [address], [place], [place] [address]    Special Requests   Final    BOTTLES DRAWN AEROBIC AND ANAEROBIC Blood Culture adequate volume Performed at [facility], [address] [place], [place] [address]    Culture   Final    NO GROWTH 5 DAYS Performed at [facility], [address], [place], [place] [address]    Report Status 04/06/2023 FINAL  Final  Culture, blood (Routine X 2) w Reflex to ID Panel     Status: None   Collection Time: 04/01/23 10:00 AM   Specimen: Left Antecubital; Blood  Result Value Ref Range Status   Specimen Description   Final    LEFT ANTECUBITAL BLOOD Performed at [facility], [address] [place], [place] [address]    Special Requests   Final    BOTTLES DRAWN AEROBIC  AND ANAEROBIC Blood Culture adequate volume Performed at [facility], [address], [place], [place] [address]    Culture   Final    NO GROWTH 5 DAYS Performed at [facility], [address], [place], [place] [address]    Report Status 04/06/2023 FINAL  Final  Urine Culture     Status: None   Collection Time: 04/01/23 11:50 AM   Specimen: Urine, Clean Catch  Result Value Ref Range  Status   Specimen Description   Final    URINE, CLEAN CATCH Performed at [facility], [address], [place], [place] [address]    Special Requests   Final    NONE Performed at [facility], [address] [place], [place] [address]    Culture   Final    NO GROWTH Performed at [facility], [address], [place], [place] [address]    Report Status 04/02/2023 FINAL  Final  SARS Coronavirus 2 by RT PCR (hospital order, performed in [facility] hospital lab) *cepheid single result test* Nasal Mucosa     Status: None   Collection Time: 04/01/23  9:11 PM   Specimen: Nasal Mucosa; Nasal Swab  Result Value Ref Range Status   SARS Coronavirus 2 by RT PCR NEGATIVE NEGATIVE Final    Comment: Performed at [facility], [address] [place], [place] [address]  MRSA Next Gen by PCR, Nasal     Status: None   Collection Time: 04/01/23  9:11 PM   Specimen: Nasal Mucosa; Nasal Swab  Result Value Ref Range Status   MRSA by PCR Next Gen NOT DETECTED NOT DETECTED Final    Comment: (NOTE) The GeneXpert MRSA Assay (FDA approved for NASAL specimens only), is one component of a comprehensive MRSA colonization surveillance program. It is not intended to diagnose MRSA infection nor to guide or monitor treatment for MRSA infections. Test performance is not FDA approved in patients less than 41 years old. Performed at [facility], [address], [place], [place] [address]   Culture, blood (routine x 2)     Status: None (Preliminary result)   Collection Time: 04/06/23  6:30 PM   Specimen: BLOOD  Result Value Ref Range Status   Specimen Description   Final    BLOOD RIGHT ANTECUBITAL Performed at [facility], [address], [place], [place] [address]    Special Requests   Final    BOTTLES DRAWN AEROBIC AND ANAEROBIC Blood Culture adequate volume Performed at [facility], [address]  [place], [place] [address]    Culture   Final    NO GROWTH 4 DAYS Performed at [facility], [address] [place], [place] [address]    Report Status PENDING  Incomplete  Culture, blood (routine x 2)     Status: None (Preliminary result)   Collection Time: 04/06/23  6:38 PM   Specimen: BLOOD  Result Value Ref Range Status   Specimen Description   Final    BLOOD LEFT ANTECUBITAL Performed at [facility], [address], [place], [place] [address]    Special Requests   Final    BOTTLES DRAWN AEROBIC AND ANAEROBIC Blood Culture adequate volume Performed at [facility], [address], [place], [place] [address]    Culture   Final    NO GROWTH 4 DAYS Performed at [facility], [address], [place], [place] [address]  Report Status PENDING  Incomplete     Labs:   Basic Metabolic Panel: Recent Labs  Lab 04/04/23 0308 04/06/23 1725 04/07/23 0556 04/08/23 0125 04/09/23 0018  NA 136 140 138 137 138  K 3.5 3.8 4.1 4.0 4.0  CL 106 104 106 103 105  CO2 21* 26 25 26 24   GLUCOSE 144* 116* 112* 119* 114*  BUN 11 20 13 17 19   CREATININE 0.92 0.78 0.89 1.02 1.02  CALCIUM 8.0* 9.4 8.7* 8.7* 8.7*  MG 1.8  --   --   --   --    Liver Function Tests: Recent Labs  Lab 04/06/23 1725  AST 25  ALT 31  ALKPHOS 74  BILITOT 0.3  PROT 6.9  ALBUMIN 3.7    CBC: Recent Labs  Lab 04/04/23 0308 04/06/23 1725 04/07/23 0556 04/08/23 0125 04/09/23 0018  WBC 6.2 8.5 7.8 9.4 10.7*  NEUTROABS  --  5.8  --   --   --   HGB 10.3* 13.2 12.0* 12.3* 12.6*  HCT 31.7* 39.5 37.6* 37.8* 39.2  MCV 84.8 84.2 85.5 84.0 87.9  PLT 183 354 311 342 345   Cardiac Enzymes: Recent Labs  Lab 04/07/23 1341  CKTOTAL 37*      IMAGING STUDIES VAS [place] ABI WITH/WO TBI  Result Date: 04/07/2023  LOWER EXTREMITY DOPPLER STUDY Patient Name:  Cody Mcconnell  Date of Exam:   04/07/2023 Medical Rec #: [id number]           Accession #:    [id number]  Date of Birth: [birth year]           Patient Gender: M Patient Age:   55 years Exam Location:  [facility] Procedure:      VAS [place] ABI WITH/WO TBI Referring Phys: PRANAV PATEL --------------------------------------------------------------------------------  Indications: Peripheral artery disease. High Risk Factors: Hypertension, hyperlipidemia.  Comparison Study: No prior study Performing Technologist: Shona Simpson  Examination Guidelines: A complete evaluation includes at minimum, Doppler waveform signals and systolic blood pressure reading at the level of bilateral brachial, anterior tibial, and posterior tibial arteries, when vessel segments are accessible. Bilateral testing is considered an integral part of a complete examination. Photoelectric Plethysmograph (PPG) waveforms and toe systolic pressure readings are included as required and additional duplex testing as needed. Limited examinations for reoccurring indications may be performed as noted.  ABI Findings: +--------+------------------+-----+---------+--------+ Right   Rt Pressure (mmHg)IndexWaveform Comment  +--------+------------------+-----+---------+--------+ [id number]                    triphasic         +--------+------------------+-----+---------+--------+ PTA     165               1.31 triphasic         +--------+------------------+-----+---------+--------+ DP      155               1.23 triphasic         +--------+------------------+-----+---------+--------+ +--------+------------------+-----+---------+-------+ Left    Lt Pressure (mmHg)IndexWaveform Comment +--------+------------------+-----+---------+-------+ [id number]                    triphasic        +--------+------------------+-----+---------+-------+ PTA     160               1.27 triphasic        +--------+------------------+-----+---------+-------+ DP      154  1.22 triphasic         +--------+------------------+-----+---------+-------+ +-------+-----------+-----------+------------+------------+ ABI/TBIToday's ABIToday's TBIPrevious ABIPrevious TBI +-------+-----------+-----------+------------+------------+ Right  1.31                                           +-------+-----------+-----------+------------+------------+ Left   1.27                                           +-------+-----------+-----------+------------+------------+  Summary: Right: Resting right ankle-brachial index is within normal range. Left: Resting left ankle-brachial index is within normal range. *See table(s) above for measurements and observations.  Electronically signed by Gerarda Fraction on 04/07/2023 at 6:01:21 PM.    Final    VAS [place] LOWER EXTREMITY VENOUS (DVT)  Result Date: 04/07/2023  Lower Venous DVT Study Patient Name:  Cody Mcconnell  Date of Exam:   04/07/2023 Medical Rec #: [id number]           Accession #:    [id number] Date of Birth: September 26, 1945           Patient Gender: M Patient Age:   73 years Exam Location:  [facility] Procedure:      VAS [place] LOWER EXTREMITY VENOUS (DVT) Referring Phys: PRANAV PATEL --------------------------------------------------------------------------------  Indications: Edema, and Swelling.  Risk Factors: None identified. Anticoagulation: Xarelto. Comparison Study: No changes since prior study 04/01/23 Performing Technologist: Shona Simpson  Examination Guidelines: A complete evaluation includes B-mode imaging, spectral Doppler, color Doppler, and power Doppler as needed of all accessible portions of each vessel. Bilateral testing is considered an integral part of a complete examination. Limited examinations for reoccurring indications may be performed as noted. The reflux portion of the exam is performed with the patient in reverse Trendelenburg.  +-----+---------------+---------+-----------+----------+--------------+  RIGHTCompressibilityPhasicitySpontaneityPropertiesThrombus Aging +-----+---------------+---------+-----------+----------+--------------+ CFV  Full           Yes      Yes                                 +-----+---------------+---------+-----------+----------+--------------+   +---------+---------------+---------+-----------+----------+--------------+ LEFT     CompressibilityPhasicitySpontaneityPropertiesThrombus Aging +---------+---------------+---------+-----------+----------+--------------+ CFV      Full           Yes      Yes                                 +---------+---------------+---------+-----------+----------+--------------+ SFJ      Full                                                        +---------+---------------+---------+-----------+----------+--------------+ FV Prox  Full                                                        +---------+---------------+---------+-----------+----------+--------------+ FV Mid   Full                                                        +---------+---------------+---------+-----------+----------+--------------+  FV DistalFull                                                        +---------+---------------+---------+-----------+----------+--------------+ PFV      Full                                                        +---------+---------------+---------+-----------+----------+--------------+ POP      Full           Yes      Yes                                 +---------+---------------+---------+-----------+----------+--------------+ PTV      Full                                                        +---------+---------------+---------+-----------+----------+--------------+ PERO     Full                                                        +---------+---------------+---------+-----------+----------+--------------+    Summary: RIGHT: - No evidence of common femoral vein  obstruction.  LEFT: - There is no evidence of deep vein thrombosis in the lower extremity.  - No cystic structure found in the popliteal fossa.  *See table(s) above for measurements and observations. Electronically signed by Gerarda Fraction on 04/07/2023 at 5:56:40 PM.    Final    CT TIBIA FIBULA LEFT W CONTRAST  Result Date: 04/07/2023 CLINICAL DATA:  Suspected left lower leg infection. EXAM: CT OF THE LOWER LEFT EXTREMITY WITH CONTRAST TECHNIQUE: Multidetector CT imaging of the lower left extremity was performed according to the standard protocol following intravenous contrast administration. RADIATION DOSE REDUCTION: This exam was performed according to the departmental dose-optimization program which includes automated exposure control, adjustment of the mA and/or kV according to patient size and/or use of iterative reconstruction technique. CONTRAST:  75mL OMNIPAQUE IOHEXOL 350 MG/ML SOLN COMPARISON:  Left tibia and fibula x-rays from yesterday. FINDINGS: Bones/Joint/Cartilage No fracture or dislocation. Degenerative changes of the knee. No joint effusion. Ligaments Ligaments are suboptimally evaluated by CT. Muscles and Tendons Grossly intact. Soft tissue Diffuse soft tissue swelling. No fluid collection or subcutaneous emphysema. No soft tissue mass. IMPRESSION: 1. Diffuse soft tissue swelling, nonspecific. No fluid collection or acute osseous abnormality. Electronically Signed   By: Obie Dredge M.D.   On: 04/07/2023 10:29   DG Tibia/Fibula Left  Result Date: 04/06/2023 CLINICAL DATA:  Infection. EXAM: LEFT TIBIA AND FIBULA - 2 VIEW COMPARISON:  X-ray 04/01/23 FINDINGS: No significant interval change. No fracture or dislocation. Preserved bone mineralization and adjacent joint spaces. Well corticated density seen about the knee joint at the edge of the imaging field. No erosive changes identified. If there is further concern  of bone infection, bone scan or MRI may be useful for further sensitivity.  IMPRESSION: No acute osseous abnormality. Electronically Signed   By: Karen Kays M.D.   On: 04/06/2023 19:07   ECHOCARDIOGRAM COMPLETE  Result Date: 04/02/2023    ECHOCARDIOGRAM REPORT   Patient Name:   Cody Mcconnell Date of Exam: 04/02/2023 Medical Rec #:  [id number]          Height:       68.0 in Accession #:    [id number]         Weight:       170.0 lb Date of Birth:  07/22/1946          BSA:          1.907 m Patient Age:    77 years           BP:           110/72 mmHg Patient Gender: M                  HR:           86 bpm. Exam Location:  Inpatient Procedure: 2D Echo, Color Doppler and Cardiac Doppler Indications:    NSTEMI  History:        Patient has prior history of Echocardiogram examinations, most                 recent 02/10/2023. Hypokalemia, NSTEMI, AKI, Aortic Valve Disease,                 Arrythmias:Atrial Fibrillation; Risk Factors:Dyslipidemia.  Sonographer:    Milbert Coulter Referring Phys: Therisa Doyne IMPRESSIONS  1. Left ventricular ejection fraction, by estimation, is 55 to 60%. The left ventricle has normal function. The left ventricle has no regional wall motion abnormalities. There is mild left ventricular hypertrophy. Left ventricular diastolic parameters were normal.  2. Right ventricular systolic function is normal. The right ventricular size is normal.  3. Left atrial size was mildly dilated.  4. The mitral valve is normal in structure. Mild mitral valve regurgitation. No evidence of mitral stenosis.  5. The aortic valve is calcified. There is severe calcifcation of the aortic valve. There is severe thickening of the aortic valve. Aortic valve regurgitation is trivial. Moderate to severe aortic valve stenosis. Aortic valve area, by VTI measures 0.80 cm. Aortic valve mean gradient measures 31.2 mmHg. Aortic valve Vmax measures 3.54 m/s.  6. The inferior vena cava is normal in size with greater than 50% respiratory variability, suggesting right atrial pressure of 3 mmHg.  Comparison(s): No significant change from prior study. FINDINGS  Left Ventricle: Left ventricular ejection fraction, by estimation, is 55 to 60%. The left ventricle has normal function. The left ventricle has no regional wall motion abnormalities. The left ventricular internal cavity size was normal in size. There is  mild left ventricular hypertrophy. Left ventricular diastolic parameters were normal. Right Ventricle: The right ventricular size is normal. No increase in right ventricular wall thickness. Right ventricular systolic function is normal. Left Atrium: Left atrial size was mildly dilated. Right Atrium: Right atrial size was normal in size. Pericardium: There is no evidence of pericardial effusion. Mitral Valve: The mitral valve is normal in structure. Mild mitral valve regurgitation. No evidence of mitral valve stenosis. Tricuspid Valve: The tricuspid valve is normal in structure. Tricuspid valve regurgitation is trivial. No evidence of tricuspid stenosis. Aortic Valve: The aortic valve is calcified. There is severe calcifcation of the aortic valve.  There is severe thickening of the aortic valve. Aortic valve regurgitation is trivial. Moderate to severe aortic stenosis is present. Aortic valve mean gradient measures 31.2 mmHg. Aortic valve peak gradient measures 50.2 mmHg. Aortic valve area, by VTI measures 0.80 cm. Pulmonic Valve: The pulmonic valve was not well visualized. Pulmonic valve regurgitation is not visualized. No evidence of pulmonic stenosis. Aorta: The aortic root and ascending aorta are structurally normal, with no evidence of dilitation. Venous: The inferior vena cava is normal in size with greater than 50% respiratory variability, suggesting right atrial pressure of 3 mmHg. IAS/Shunts: The atrial septum is grossly normal.  LEFT VENTRICLE PLAX 2D LVIDd:         4.90 cm   Diastology LVIDs:         3.30 cm   LV e' medial:    9.57 cm/s LV PW:         1.20 cm   LV E/e' medial:  9.4 LV IVS:         1.20 cm   LV e' lateral:   12.50 cm/s LVOT diam:     1.90 cm   LV E/e' lateral: 7.2 LV SV:         60 LV SV Index:   32 LVOT Area:     2.84 cm  RIGHT VENTRICLE RV S prime:     15.20 cm/s TAPSE (M-mode): 2.0 cm LEFT ATRIUM             Index        RIGHT ATRIUM           Index LA diam:        4.60 cm 2.41 cm/m   RA Area:     13.10 cm LA Vol (A2C):   40.5 ml 21.23 ml/m  RA Volume:   29.20 ml  15.31 ml/m LA Vol (A4C):   59.5 ml 31.20 ml/m LA Biplane Vol: 51.1 ml 26.79 ml/m  AORTIC VALVE AV Area (Vmax):    0.87 cm AV Area (Vmean):   0.82 cm AV Area (VTI):     0.80 cm AV Vmax:           354.20 cm/s AV Vmean:          265.200 cm/s AV VTI:            0.754 m AV Peak Grad:      50.2 mmHg AV Mean Grad:      31.2 mmHg LVOT Vmax:         109.00 cm/s LVOT Vmean:        76.500 cm/s LVOT VTI:          0.212 m LVOT/AV VTI ratio: 0.28  AORTA Ao Root diam: 3.10 cm Ao Asc diam:  3.10 cm MITRAL VALVE MV Area (PHT): 4.17 cm    SHUNTS MV Decel Time: 182 msec    Systemic VTI:  0.21 m MV E velocity: 90.10 cm/s  Systemic Diam: 1.90 cm MV A velocity: 82.80 cm/s MV E/A ratio:  1.09 Jodelle Red MD Electronically signed by Jodelle Red MD Signature Date/Time: 04/02/2023/12:02:21 PM    Final    CT Angio Chest Pulmonary Embolism (PE) W or WO Contrast  Result Date: 04/02/2023 CLINICAL DATA:  Cardiac palpitations and difficulty breathing EXAM: CT ANGIOGRAPHY CHEST WITH CONTRAST TECHNIQUE: Multidetector CT imaging of the chest was performed using the standard protocol during bolus administration of intravenous contrast. Multiplanar CT image reconstructions and MIPs were obtained to evaluate the vascular anatomy.  RADIATION DOSE REDUCTION: This exam was performed according to the departmental dose-optimization program which includes automated exposure control, adjustment of the mA and/or kV according to patient size and/or use of iterative reconstruction technique. CONTRAST:  75mL OMNIPAQUE IOHEXOL 350 MG/ML SOLN  COMPARISON:  Plain film from earlier in the same day. FINDINGS: Cardiovascular: Thoracic aorta shows normal branching pattern with atherosclerotic calcifications. No aneurysmal dilatation is seen. Heart is not significantly enlarged in size. The pulmonary artery shows a normal branching pattern bilaterally. No filling defect to suggest pulmonary embolism is noted. Coronary calcifications are noted. Mediastinum/Nodes: Thoracic inlet is within normal limits. No hilar or mediastinal adenopathy is noted. The esophagus as visualized is within normal limits. Lungs/Pleura: Lungs are well aerated bilaterally. No focal infiltrate or sizable effusion is seen. Upper Abdomen: Visualized upper abdomen is within normal limits. Musculoskeletal: No acute abnormality noted. Review of the MIP images confirms the above findings. IMPRESSION: No evidence of pulmonary emboli. No acute abnormality seen. Electronically Signed   By: Alcide Clever M.D.   On: 04/02/2023 00:22   US Venous Img Lower  Left (DVT Study)  Result Date: 04/01/2023 CLINICAL DATA:  Redness and swelling EXAM: Left LOWER EXTREMITY VENOUS DOPPLER ULTRASOUND TECHNIQUE: Gray-scale sonography with compression, as well as color and duplex ultrasound, were performed to evaluate the deep venous system(s) from the level of the common femoral vein through the popliteal and proximal calf veins. COMPARISON:  None Available. FINDINGS: VENOUS Normal compressibility of the common femoral, superficial femoral, and popliteal veins, as well as the visualized calf veins. Visualized portions of profunda femoral vein and great saphenous vein unremarkable. No filling defects to suggest DVT on grayscale or color Doppler imaging. Doppler waveforms show normal direction of venous flow, normal respiratory plasticity and response to augmentation. Limited views of the contralateral common femoral vein are unremarkable. OTHER None. Limitations: none IMPRESSION: No evidence of left lower  extremity DVT Electronically Signed   By: Karen Kays M.D.   On: 04/01/2023 11:15   DG Tibia/Fibula Left  Result Date: 04/01/2023 CLINICAL DATA:  Redness swelling ?DVT EXAM: LEFT TIBIA AND FIBULA - 2 VIEW COMPARISON:  None Available. FINDINGS: No acute fracture or dislocation. No aggressive osseous lesion. There are degenerative changes of the knee joint in the form of mildly reduced medial tibio-femoral compartment joint space, tibial spiking and tricompartmental osteophytosis. Ankle mortise appears intact. No focal soft tissue swelling. No radiopaque foreign bodies. IMPRESSION: *No acute osseous abnormality. *Mild degenerative changes of the left knee joint. Electronically Signed   By: Jules Schick M.D.   On: 04/01/2023 10:37   DG Chest Port 1 View  Result Date: 04/01/2023 CLINICAL DATA:  Palpitations EXAM: PORTABLE CHEST 1 VIEW COMPARISON:  CT scan chest from 11/26/2022. FINDINGS: Bilateral lung fields are clear. Bilateral costophrenic angles are clear. Normal cardio-mediastinal silhouette. No acute osseous abnormalities. Partially seen lower cervical spinal fixation hardware. The soft tissues are within normal limits. IMPRESSION: No active disease. Electronically Signed   By: Jules Schick M.D.   On: 04/01/2023 10:35    DISCHARGE EXAMINATION: Vitals:   04/08/23 1440 04/08/23 2124 04/09/23 0556 04/09/23 0807  BP: 116/69 131/84 130/69 114/62  Pulse: 74 75  83  Resp: (!) 21   16  Temp: 98.4 F (36.9 C) 98.6 F (37 C) 98.5 F (36.9 C) 98 F (36.7 C)  TempSrc: Oral   Oral  SpO2: 98% 99% 99% 99%  Weight:      Height:       General  appearance: Awake alert.  In no distress Resp: Clear to auscultation bilaterally.  Normal effort Cardio: S1-S2 is normal regular.  No S3-S4.  No rubs murmurs or bruit GI: Abdomen is soft.  Nontender nondistended.  Bowel sounds are present normal.  No masses organomegaly Improved erythema and swelling of the left lower extremity  DISPOSITION:  Home  Discharge Instructions     Call MD for:  extreme fatigue   Complete by: As directed    Call MD for:  persistant dizziness or light-headedness   Complete by: As directed    Call MD for:  persistant nausea and vomiting   Complete by: As directed    Call MD for:  severe uncontrolled pain   Complete by: As directed    Call MD for:  temperature >100.4   Complete by: As directed    Diet - low sodium heart healthy   Complete by: As directed    Discharge instructions   Complete by: As directed    Please take your medications as prescribed.  Please be sure to follow-up with your PCP within 1 week after discharge.  You were cared for by a hospitalist during your hospital stay. If you have any questions about your discharge medications or the care you received while you were in the hospital after you are discharged, you can call the unit and asked to speak with the hospitalist on call if the hospitalist that took care of you is not available. Once you are discharged, your primary care physician will handle any further medical issues. Please note that NO REFILLS for any discharge medications will be authorized once you are discharged, as it is imperative that you return to your primary care physician (or establish a relationship with a primary care physician if you do not have one) for your aftercare needs so that they can reassess your need for medications and monitor your lab values. If you do not have a primary care physician, you can call [phone] for a physician referral.   Increase activity slowly   Complete by: As directed          Allergies as of 04/09/2023   No Known Allergies      Medication List     STOP taking these medications    cephALEXin 500 MG capsule Commonly known as: KEFLEX       TAKE these medications    acetaminophen 325 MG tablet Commonly known as: Tylenol Take 2 tablets (650 mg total) by mouth every 6 (six) hours as needed for mild pain or fever.    cefpodoxime 200 MG tablet Commonly known as: VANTIN Take 1 tablet (200 mg total) by mouth 2 (two) times daily for 7 days.   cetirizine 10 MG tablet Commonly known as: ZYRTEC Take 10 mg by mouth daily.   diclofenac Sodium 1 % Gel Commonly known as: VOLTAREN Apply 1 Application topically 2 (two) times daily as needed (joint pain).   doxycycline 100 MG tablet Commonly known as: VIBRA-TABS Take 1 tablet (100 mg total) by mouth 2 (two) times daily for 7 days.   fluticasone 50 MCG/ACT nasal spray Commonly known as: FLONASE Place 1 spray into both nostrils daily as needed for allergies or rhinitis.   methotrexate 7.5 MG tablet Commonly known as: RHEUMATREX Take 7.5 mg by mouth once a week. Caution" Chemotherapy. Protect from light.   metoprolol tartrate 25 MG tablet Commonly known as: LOPRESSOR Take 0.5 tablets (12.5 mg total) by mouth 2 (two) times daily.  montelukast 10 MG tablet Commonly known as: SINGULAIR Take 1 tablet (10 mg total) by mouth daily as needed (for allergies).   rivaroxaban 20 MG Tabs tablet Commonly known as: XARELTO Take 1 tablet (20 mg total) by mouth daily with supper.   sildenafil 100 MG tablet Commonly known as: VIAGRA Take 100 mg by mouth daily as needed for erectile dysfunction.   simvastatin 40 MG tablet Commonly known as: ZOCOR Take 40 mg by mouth daily.   triamterene-hydrochlorothiazide 37.5-25 MG capsule Commonly known as: DYAZIDE Take 1 each (1 capsule total) by mouth every morning.          Follow-up Information     Linus Galas, NP. Schedule an appointment as soon as possible for a visit in 1 week(s).   Why: post hospitalization follow up Contact information: [address] [place] [address] [phone]                 TOTAL DISCHARGE TIME: 35 minutes  Ireland Chagnon Rito Ehrlich  [facility] Pager on [email or web address]  04/10/2023, 12:33 PM

## 2023-04-09 NOTE — Progress Notes (Signed)
RN gave patient DC instructions and he stated understanding IV has been removed and new medications escribed to home pharmacy. Pt getting dressed waiting for ride

## 2023-04-10 DIAGNOSIS — M9903 Segmental and somatic dysfunction of lumbar region: Secondary | ICD-10-CM | POA: Diagnosis not present

## 2023-04-10 DIAGNOSIS — M5431 Sciatica, right side: Secondary | ICD-10-CM | POA: Diagnosis not present

## 2023-04-10 DIAGNOSIS — M5134 Other intervertebral disc degeneration, thoracic region: Secondary | ICD-10-CM | POA: Diagnosis not present

## 2023-04-10 DIAGNOSIS — M9902 Segmental and somatic dysfunction of thoracic region: Secondary | ICD-10-CM | POA: Diagnosis not present

## 2023-04-10 DIAGNOSIS — M9904 Segmental and somatic dysfunction of sacral region: Secondary | ICD-10-CM | POA: Diagnosis not present

## 2023-04-11 DIAGNOSIS — Z09 Encounter for follow-up examination after completed treatment for conditions other than malignant neoplasm: Secondary | ICD-10-CM | POA: Diagnosis not present

## 2023-04-11 DIAGNOSIS — L03116 Cellulitis of left lower limb: Secondary | ICD-10-CM | POA: Diagnosis not present

## 2023-04-11 DIAGNOSIS — I480 Paroxysmal atrial fibrillation: Secondary | ICD-10-CM | POA: Diagnosis not present

## 2023-04-17 DIAGNOSIS — M5416 Radiculopathy, lumbar region: Secondary | ICD-10-CM | POA: Diagnosis not present

## 2023-04-17 DIAGNOSIS — M7918 Myalgia, other site: Secondary | ICD-10-CM | POA: Diagnosis not present

## 2023-04-17 DIAGNOSIS — G5781 Other specified mononeuropathies of right lower limb: Secondary | ICD-10-CM | POA: Diagnosis not present

## 2023-04-18 DIAGNOSIS — L03116 Cellulitis of left lower limb: Secondary | ICD-10-CM | POA: Diagnosis not present

## 2023-04-18 DIAGNOSIS — I480 Paroxysmal atrial fibrillation: Secondary | ICD-10-CM | POA: Diagnosis not present

## 2023-04-24 DIAGNOSIS — R5383 Other fatigue: Secondary | ICD-10-CM | POA: Diagnosis not present

## 2023-04-24 DIAGNOSIS — E663 Overweight: Secondary | ICD-10-CM | POA: Diagnosis not present

## 2023-04-24 DIAGNOSIS — R0609 Other forms of dyspnea: Secondary | ICD-10-CM | POA: Diagnosis not present

## 2023-04-24 DIAGNOSIS — R2242 Localized swelling, mass and lump, left lower limb: Secondary | ICD-10-CM | POA: Diagnosis not present

## 2023-04-24 DIAGNOSIS — Z6825 Body mass index (BMI) 25.0-25.9, adult: Secondary | ICD-10-CM | POA: Diagnosis not present

## 2023-04-24 DIAGNOSIS — H8109 Meniere's disease, unspecified ear: Secondary | ICD-10-CM | POA: Diagnosis not present

## 2023-04-26 DIAGNOSIS — M79604 Pain in right leg: Secondary | ICD-10-CM | POA: Diagnosis not present

## 2023-04-26 DIAGNOSIS — M5416 Radiculopathy, lumbar region: Secondary | ICD-10-CM | POA: Diagnosis not present

## 2023-04-26 DIAGNOSIS — R531 Weakness: Secondary | ICD-10-CM | POA: Diagnosis not present

## 2023-04-29 DIAGNOSIS — M9903 Segmental and somatic dysfunction of lumbar region: Secondary | ICD-10-CM | POA: Diagnosis not present

## 2023-04-29 DIAGNOSIS — M9902 Segmental and somatic dysfunction of thoracic region: Secondary | ICD-10-CM | POA: Diagnosis not present

## 2023-04-29 DIAGNOSIS — H903 Sensorineural hearing loss, bilateral: Secondary | ICD-10-CM | POA: Diagnosis not present

## 2023-04-29 DIAGNOSIS — R42 Dizziness and giddiness: Secondary | ICD-10-CM | POA: Diagnosis not present

## 2023-04-29 DIAGNOSIS — M9904 Segmental and somatic dysfunction of sacral region: Secondary | ICD-10-CM | POA: Diagnosis not present

## 2023-04-29 DIAGNOSIS — M5431 Sciatica, right side: Secondary | ICD-10-CM | POA: Diagnosis not present

## 2023-04-29 DIAGNOSIS — Z0110 Encounter for examination of ears and hearing without abnormal findings: Secondary | ICD-10-CM | POA: Diagnosis not present

## 2023-04-29 DIAGNOSIS — M5134 Other intervertebral disc degeneration, thoracic region: Secondary | ICD-10-CM | POA: Diagnosis not present

## 2023-04-30 ENCOUNTER — Encounter: Payer: Self-pay | Admitting: Cardiovascular Disease

## 2023-04-30 ENCOUNTER — Ambulatory Visit: Payer: Medicare Other | Attending: Cardiovascular Disease | Admitting: Cardiovascular Disease

## 2023-04-30 VITALS — BP 104/66 | HR 90 | Ht 68.0 in | Wt 169.4 lb

## 2023-04-30 DIAGNOSIS — I350 Nonrheumatic aortic (valve) stenosis: Secondary | ICD-10-CM | POA: Insufficient documentation

## 2023-04-30 DIAGNOSIS — I480 Paroxysmal atrial fibrillation: Secondary | ICD-10-CM | POA: Insufficient documentation

## 2023-04-30 NOTE — Progress Notes (Signed)
Cardiology Office Note:    Date:  04/30/2023   ID:  LA CABREROS, DOB 11-08-1945, MRN [id number]  PCP:  Linus Galas, NP    [facility] Providers Cardiologist:  Tonny Bollman, MD     Referring MD: Linus Galas, NP   Chief Complaint  Patient presents with   Atrial Fibrillation   History of Present Illness:    Cody Mcconnell is a 77 y.o. male with a hx of paroxysmal atrial fibrillation and moderate aortic stenosis, presenting for follow-up evaluation.  Comorbid conditions include coronary artery calcification, hypertension, and mixed hyperlipidemia.     The patient is here alone today.  He was recently hospitalized with cellulitis and systemic illness.  He was found to be in atrial fibrillation with RVR and converted spontaneously back to sinus rhythm.  He was started on apixaban.  He also was noted to have elevated troponin felt to be reflective of demand ischemia in the context of infection and atrial fibrillation with RVR.  An echocardiogram during his admission showed normal LVEF of 55 to 60%, severely calcified and restricted aortic valve with moderate aortic stenosis, peak transaortic velocity less than 4 m/s, mean gradient 31 mmHg, dimensionless index 0.28.  The patient is here alone today.  He has been getting along pretty well since his hospitalization.  His primary complaint continues to be low back and right groin pain.  He has had multiple surgeries.  He is planning on going to a specialized rehab program at the [facility] rather than pursuing any further surgery.  He denies any chest pain, shortness of breath, orthopnea, or PND.  He has had no heart palpitations.  He had no perception of heart racing or irregular heartbeat when he was in rapid atrial fibrillation.  Past Medical History:  Diagnosis Date   Anemia    SLIGHT ANEMIA 4 MONTHS AGO   Aortic valve stenosis 12/19/2017   Echo 07/30/16 - EF 55-60, mild aortic stenosis (mean 9) // Echo 4/19:  EF 60-65  mild AS (mean 11) // Echocardiogram 11/21: EF 60-65, no RWMA, mild LVH, Gr 1 DD, normal RVSF, trivial MR, calcified AV, mod AS (mean 20 mmHg, Vmax 280 cm/s, DI 0.27), trivial AI  // Echo 5/22: EF 60-65, moderate LVH, no RWMA, trivial MR, moderate AS (mean gradient 20 mmHg, V-max 287 cm/s, DI 0.35)    Arthritis    OA   Benign prostate hyperplasia    unspecified whether lower urinary tract sym. present    Carotid atherosclerosis    Carotid US 4/19:  bilat ICA 1-39; vertebrals and subclavians normal   Carotid atherosclerosis, bilateral 04/30/2018   1-39% on 12/2017 [facility] Echo   Coronary artery calcification seen on CT scan 06/05/2018   Dysrhythmia    ATRIAL FIB   Elevated prostate specific antigen (PSA) 08/30/2016   Glucose intolerance    "pre-diabetic"   Hayfever    Heart murmur    History of exercise stress test    GXT 4/19:  ETT with good exercise tolerance (10:00); no chest pain; normal BP response; no diagnostic ST changes; negative adequate ETT.   HOH (hard of hearing)    BOTH EARS   Low back pain with sciatica 06/02/2012   Lumbar herniated disc 01/30/2018   Mild aortic stenosis 04/30/2018   12/2017 [facility] Echo : Mean gradient (S): 11 mm Hg. Peak gradient (S): 24 mm Hg.   Numbness of right thumb    FROM CERVICAL NECK SURGERY   Osteoarthritis  of right hip 09/21/2018   Overweight (BMI 25.0-29.9) 09/30/2018   Paroxysmal atrial fibrillation (HCC) 06/05/2018   Pre-diabetes    Prediabetes 04/30/2018   not sure about this   Prostatitis    unspecified prostatitis type   Pure hypercholesterolemia     Past Surgical History:  Procedure Laterality Date   ANTERIOR LAT LUMBAR FUSION Right 06/29/2020   Procedure: Right Lumbar Two-Three Anterolateral lumbar interbody fusion with lateral plate;  Surgeon: Maeola Harman, MD;  Location: [facility] OR;  Service: Neurosurgery;  Laterality: Right;  Right Lumbar Two-Three Anterolateral lumbar interbody fusion with lateral plate    CERVICAL SPINE SURGERY  2011   COLONOSCOPY     HIP SURGERY Right    arthroscopy   LUMBAR DISC SURGERY  2014   L2-3, L3 TO L4 X 2 2019 AT [facility]   LUMBAR FUSION  2019   psoas muscle repair (groin muscle?)  2022   SHOULDER ARTHROSCOPY Right    TOOTH EXTRACTION     TOTAL HIP ARTHROPLASTY Right 09/29/2018   Procedure: TOTAL HIP ARTHROPLASTY ANTERIOR APPROACH;  Surgeon: Durene Romans, MD;  Location: [facility] ORS;  Service: Orthopedics;  Laterality: Right;  71    Current Medications: Current Meds  Medication Sig   acetaminophen (TYLENOL) 325 MG tablet Take 2 tablets (650 mg total) by mouth every 6 (six) hours as needed for mild pain or fever.   cetirizine (ZYRTEC) 10 MG tablet Take 10 mg by mouth daily.   diclofenac Sodium (VOLTAREN) 1 % GEL Apply 1 Application topically 2 (two) times daily as needed (joint pain).   fluticasone (FLONASE) 50 MCG/ACT nasal spray Place 1 spray into both nostrils daily as needed for allergies or rhinitis.   methotrexate (RHEUMATREX) 7.5 MG tablet Take 7.5 mg by mouth once a week. Caution" Chemotherapy. Protect from light.   montelukast (SINGULAIR) 10 MG tablet Take 1 tablet (10 mg total) by mouth daily as needed (for allergies).   rivaroxaban (XARELTO) 20 MG TABS tablet Take 1 tablet (20 mg total) by mouth daily with supper.   sildenafil (VIAGRA) 100 MG tablet Take 100 mg by mouth daily as needed for erectile dysfunction.   simvastatin (ZOCOR) 40 MG tablet Take 40 mg by mouth daily.   triamterene-hydrochlorothiazide (DYAZIDE) 37.5-25 MG capsule Take 1 each (1 capsule total) by mouth every morning.   [DISCONTINUED] metoprolol tartrate (LOPRESSOR) 25 MG tablet Take 0.5 tablets (12.5 mg total) by mouth 2 (two) times daily.     Allergies:   Patient has no known allergies.   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   Occupation: Long Term Care Facilities    Comment: Owns several  Tobacco Use   Smoking status: Former    Current packs/day: 0.00    Average packs/day: 2.0 packs/day for 9.0 years (18.0 ttl pk-yrs)    Types: Cigarettes    Start date: 1969    Quit date: 1978    Years since quitting: 46.6   Smokeless tobacco: Never   Tobacco comments:    QUIT   Vaping Use   Vaping status: Never Used  Substance and Sexual Activity   Alcohol use: Never    Comment: rarely   Drug use: Never   Sexual activity:  Not on file  Other Topics Concern   Not on file  Social History Narrative   Previously in [place], [place] - now in [place]   Brother is Dr. Patrica Duel (retired) from [place], [place]   Social Determinants of Health   Financial Resource Strain: Low Risk  (12/24/2021)   Received from [facility]   Overall Financial Resource Strain (CARDIA)  Food Insecurity: No Food Insecurity (04/06/2023)   Hunger Vital Sign    Worried About Running Out of Food in the Last Year: Never true    Ran Out of Food in the Last Year: Never true  Transportation Needs: No Transportation Needs (04/06/2023)   PRAPARE - Administrator, Civil Service (Medical): No    Lack of Transportation (Non-Medical): No  Physical Activity: Sufficiently Active (04/02/2023)   Exercise Vital Sign    Days of Exercise per Week: 5 days    Minutes of Exercise per Session: 30 min  Stress: No Stress Concern Present (04/02/2023)   [employer] of Occupational Health - Occupational Stress Questionnaire    Feeling of Stress : Only a little  Social Connections: Moderately Isolated (04/02/2023)   Social Connection and Isolation Panel [NHANES]    Frequency of Communication with Friends and Family: Twice a week    Frequency of Social Gatherings with Friends and Family: Twice a week    Attends Religious Services: Never    Database administrator or  Organizations: No    Attends Engineer, structural: Never    Marital Status: Married     Family History: The patient's family history includes CAD in his father; CAD (age of onset: 51) in his brother; Congestive Heart Failure in his father; Dementia in his mother.  ROS:   Please see the history of present illness.    All other systems reviewed and are negative.  EKGs/Labs/Other Studies Reviewed:    The following studies were reviewed today: EKG Interpretation Date/Time:  Wednesday April 30 2023 13:58:19 EDT Ventricular Rate:  90 PR Interval:  166 QRS Duration:  86 QT Interval:  370 QTC Calculation: 452 R Axis:   8  Text Interpretation: Sinus rhythm with Premature atrial complexes When compared with ECG of 02-Apr-2023 05:20, Premature atrial complexes are now Present Criteria for Septal infarct are no longer Present Confirmed by Tonny Bollman [phone]) on 04/30/2023 2:11:34 PM    Recent Labs: 04/01/2023: TSH 2.767 04/04/2023: Magnesium 1.8 04/06/2023: ALT 31 04/09/2023: BUN 19; Creatinine, Ser 1.02; Hemoglobin 12.6; Platelets 345; Potassium 4.0; Sodium 138  Recent Lipid Panel No results found for: "CHOL", "TRIG", "HDL", "CHOLHDL", "VLDL", "LDLCALC", "LDLDIRECT"   Risk Assessment/Calculations:    CHA2DS2-VASc Score = 3   This indicates a 3.2% annual risk of stroke. The patient's score is based upon: CHF History: 0 HTN History: 1 Diabetes History: 0 Stroke History: 0 Vascular Disease History: 0 Age Score: 2 Gender Score: 0               Physical Exam:    VS:  BP 104/66   Pulse 90   Ht 5\' 8"  (1.727 m)   Wt 169 lb 6.4 oz (76.8 kg)   SpO2 94%   BMI 25.76 kg/m     Wt Readings from Last 3 Encounters:  04/30/23 169 lb 6.4 oz (76.8 kg)  04/06/23 169 lb 15.6 oz (77.1 kg)  04/02/23 170 lb (77.1 kg)     GEN:  Well nourished, well developed in no acute distress HEENT: Normal  NECK: No JVD; No carotid bruits LYMPHATICS: No lymphadenopathy CARDIAC: RRR,  2/6 harsh mid peaking systolic murmur at the right upper sternal border with audible A2 RESPIRATORY:  Clear to auscultation without rales, wheezing or rhonchi  ABDOMEN: Soft, non-tender, non-distended MUSCULOSKELETAL:  No edema; No deformity  SKIN: Warm and dry with diffuse ecchymoses on his arms NEUROLOGIC:  Alert and oriented x 3 PSYCHIATRIC:  Normal affect   ASSESSMENT:    1. Nonrheumatic aortic valve stenosis   2. Paroxysmal atrial fibrillation (HCC)    PLAN:    In order of problems listed above:  Patient remains asymptomatic.  Recent echo with LVEF 55 to 60%, mild the valve is severely calcified, all of his Doppler parameters are consistent with moderate aortic stenosis.  His mean gradient is 31 mmHg, maximum transaortic velocity is 3.5 m/s, and dimensionless index 0.28.  We will continue with echo surveillance.  I discussed the cardinal symptoms of aortic stenosis with him again today and he understands to watch for any progressive fatigue, dyspnea, or chest discomfort.  I will plan to repeat an echo in 1 year. Remote history of atrial fibrillation, now with a recent recurrence in the context of cellulitis and sepsis.  He is appropriately anticoagulated with a CHADS2 Vascor of 3, recently started on rivaroxaban.  He does not like taking anticoagulation but understands the indication and is willing to continue.  If he has progressive problems with bruising and/or bleeding, he could be considered for watchman flex implantation.  Because he had demand ischemia with troponin above 1000, even with an absence of chest discomfort or angina, I think it is appropriate to risk stratify him with a Lexiscan Myoview stress test.  I will plan to see him back in 6 months for follow-up evaluation.      Informed Consent   Shared Decision Making/Informed Consent The risks [chest pain, shortness of breath, cardiac arrhythmias, dizziness, blood pressure fluctuations, myocardial infarction, stroke/transient  ischemic attack, nausea, vomiting, allergic reaction, radiation exposure, metallic taste sensation and life-threatening complications (estimated to be 1 in 10,000)], benefits (risk stratification, diagnosing coronary artery disease, treatment guidance) and alternatives of a nuclear stress test were discussed in detail with Mr. Boni and he agrees to proceed.       Medication Adjustments/Labs and Tests Ordered: Current medicines are reviewed at length with the patient today.  Concerns regarding medicines are outlined above.  Orders Placed This Encounter  Procedures   MYOCARDIAL PERFUSION IMAGING   EKG 12-Lead   No orders of the defined types were placed in this encounter.   Patient Instructions  Medication Instructions:  Your physician recommends that you continue on your current medications as directed. Please refer to the Current Medication list given to you today.  *If you need a refill on your cardiac medications before your next appointment, please call your pharmacy*  Lab Work: NONE If you have labs (blood work) drawn today and your tests are completely normal, you will receive your results only by: MyChart Message (if you have MyChart) OR A paper copy in the mail If you have any lab test that is abnormal or we need to change your treatment, we will call you to review the results.  Testing/Procedures: Steffanie Dunn Stress Test Your physician has requested that you have a lexiscan myoview. For further information please visit [email or web address] Please follow instruction sheet, as given.  Follow-Up: At [facility], you and your health needs are our priority.  As part of our continuing mission  to provide you with exceptional heart care, we have created designated Provider Care Teams.  These Care Teams include your primary Cardiologist (physician) and Advanced Practice Providers (APPs -  Physician Assistants and Nurse Practitioners) who all work together to provide  you with the care you need, when you need it.   Your next appointment:   6 month(s)  Provider:   Tonny Bollman, MD        Signed, Tonny Bollman, MD  04/30/2023 5:07 PM    [facility]

## 2023-04-30 NOTE — Patient Instructions (Signed)
Medication Instructions:  Your physician recommends that you continue on your current medications as directed. Please refer to the Current Medication list given to you today.  *If you need a refill on your cardiac medications before your next appointment, please call your pharmacy*  Lab Work: NONE If you have labs (blood work) drawn today and your tests are completely normal, you will receive your results only by: MyChart Message (if you have MyChart) OR A paper copy in the mail If you have any lab test that is abnormal or we need to change your treatment, we will call you to review the results.  Testing/Procedures: Steffanie Dunn Stress Test Your physician has requested that you have a lexiscan myoview. For further information please visit [email or web address] Please follow instruction sheet, as given.  Follow-Up: At [facility], you and your health needs are our priority.  As part of our continuing mission to provide you with exceptional heart care, we have created designated Provider Care Teams.  These Care Teams include your primary Cardiologist (physician) and Advanced Practice Providers (APPs -  Physician Assistants and Nurse Practitioners) who all work together to provide you with the care you need, when you need it.   Your next appointment:   6 month(s)  Provider:   Tonny Bollman, MD

## 2023-05-01 DIAGNOSIS — Z4589 Encounter for adjustment and management of other implanted devices: Secondary | ICD-10-CM | POA: Diagnosis not present

## 2023-05-01 DIAGNOSIS — H9313 Tinnitus, bilateral: Secondary | ICD-10-CM | POA: Diagnosis not present

## 2023-05-01 DIAGNOSIS — H8103 Meniere's disease, bilateral: Secondary | ICD-10-CM | POA: Diagnosis not present

## 2023-05-01 DIAGNOSIS — M545 Low back pain, unspecified: Secondary | ICD-10-CM | POA: Diagnosis not present

## 2023-05-01 DIAGNOSIS — H903 Sensorineural hearing loss, bilateral: Secondary | ICD-10-CM | POA: Diagnosis not present

## 2023-05-01 DIAGNOSIS — H6992 Unspecified Eustachian tube disorder, left ear: Secondary | ICD-10-CM | POA: Diagnosis not present

## 2023-05-01 DIAGNOSIS — G8929 Other chronic pain: Secondary | ICD-10-CM | POA: Diagnosis not present

## 2023-05-07 ENCOUNTER — Telehealth (HOSPITAL_COMMUNITY): Payer: Self-pay | Admitting: *Deleted

## 2023-05-07 ENCOUNTER — Encounter (HOSPITAL_COMMUNITY): Payer: Self-pay | Admitting: Cardiovascular Disease

## 2023-05-07 NOTE — Telephone Encounter (Signed)
Patient given detailed instructions per Myocardial Perfusion Study Information Sheet for the test on 05/08/2023 at 7:15. Patient notified to arrive 15 minutes early and that it is imperative to arrive on time for appointment to keep from having the test rescheduled.  If you need to cancel or reschedule your appointment, please call the office within 24 hours of your appointment. . Patient verbalized understanding.Cody Mcconnell

## 2023-05-08 ENCOUNTER — Ambulatory Visit (HOSPITAL_COMMUNITY): Payer: Medicare Other | Attending: Cardiology

## 2023-05-08 DIAGNOSIS — I480 Paroxysmal atrial fibrillation: Secondary | ICD-10-CM | POA: Diagnosis not present

## 2023-05-08 DIAGNOSIS — I350 Nonrheumatic aortic (valve) stenosis: Secondary | ICD-10-CM | POA: Insufficient documentation

## 2023-05-08 DIAGNOSIS — N138 Other obstructive and reflux uropathy: Secondary | ICD-10-CM | POA: Diagnosis not present

## 2023-05-08 DIAGNOSIS — N401 Enlarged prostate with lower urinary tract symptoms: Secondary | ICD-10-CM | POA: Diagnosis not present

## 2023-05-08 DIAGNOSIS — R972 Elevated prostate specific antigen [PSA]: Secondary | ICD-10-CM | POA: Diagnosis not present

## 2023-05-08 LAB — MYOCARDIAL PERFUSION IMAGING
Base ST Depression (mm): 0 mm
LV dias vol: 105 mL (ref 62–150)
LV sys vol: 45 mL
Nuc Stress EF: 57 %
Peak HR: 86 {beats}/min
Rest HR: 60 {beats}/min
Rest Nuclear Isotope Dose: 10.6 mCi
SDS: 3
SRS: 0
SSS: 3
ST Depression (mm): 0 mm
Stress Nuclear Isotope Dose: 32.1 mCi
TID: 1.03

## 2023-05-08 MED ORDER — REGADENOSON 0.4 MG/5ML IV SOLN
0.4000 mg | Freq: Once | INTRAVENOUS | Status: AC
  Administered 2023-05-08: 0.4 mg via INTRAVENOUS

## 2023-05-08 MED ORDER — TECHNETIUM TC 99M TETROFOSMIN IV KIT
32.1000 | PACK | Freq: Once | INTRAVENOUS | Status: AC | PRN
  Administered 2023-05-08: 32.1 via INTRAVENOUS

## 2023-05-08 MED ORDER — TECHNETIUM TC 99M TETROFOSMIN IV KIT
10.6000 | PACK | Freq: Once | INTRAVENOUS | Status: AC | PRN
  Administered 2023-05-08: 10.6 via INTRAVENOUS

## 2023-05-09 ENCOUNTER — Ambulatory Visit: Payer: Medicare Other | Admitting: Cardiovascular Disease

## 2023-05-14 DIAGNOSIS — H04123 Dry eye syndrome of bilateral lacrimal glands: Secondary | ICD-10-CM | POA: Diagnosis not present

## 2023-05-14 DIAGNOSIS — H04223 Epiphora due to insufficient drainage, bilateral lacrimal glands: Secondary | ICD-10-CM | POA: Diagnosis not present

## 2023-05-14 DIAGNOSIS — Z09 Encounter for follow-up examination after completed treatment for conditions other than malignant neoplasm: Secondary | ICD-10-CM | POA: Diagnosis not present

## 2023-05-14 DIAGNOSIS — H0279 Other degenerative disorders of eyelid and periocular area: Secondary | ICD-10-CM | POA: Diagnosis not present

## 2023-05-15 ENCOUNTER — Telehealth: Payer: Self-pay | Admitting: Cardiovascular Disease

## 2023-05-15 NOTE — Telephone Encounter (Signed)
Follow Up:      Patient is retuning a call from yesterday, concerning his test results.

## 2023-05-15 NOTE — Telephone Encounter (Signed)
Returned call to patient and provided normal stress test results.

## 2023-05-20 DIAGNOSIS — L814 Other melanin hyperpigmentation: Secondary | ICD-10-CM | POA: Diagnosis not present

## 2023-05-20 DIAGNOSIS — Z86018 Personal history of other benign neoplasm: Secondary | ICD-10-CM | POA: Diagnosis not present

## 2023-05-20 DIAGNOSIS — L570 Actinic keratosis: Secondary | ICD-10-CM | POA: Diagnosis not present

## 2023-05-20 DIAGNOSIS — D1801 Hemangioma of skin and subcutaneous tissue: Secondary | ICD-10-CM | POA: Diagnosis not present

## 2023-05-20 DIAGNOSIS — D229 Melanocytic nevi, unspecified: Secondary | ICD-10-CM | POA: Diagnosis not present

## 2023-05-20 DIAGNOSIS — L821 Other seborrheic keratosis: Secondary | ICD-10-CM | POA: Diagnosis not present

## 2023-05-20 DIAGNOSIS — L578 Other skin changes due to chronic exposure to nonionizing radiation: Secondary | ICD-10-CM | POA: Diagnosis not present

## 2023-05-20 DIAGNOSIS — Z86006 Personal history of melanoma in-situ: Secondary | ICD-10-CM | POA: Diagnosis not present

## 2023-05-20 DIAGNOSIS — Z85828 Personal history of other malignant neoplasm of skin: Secondary | ICD-10-CM | POA: Diagnosis not present

## 2023-05-21 DIAGNOSIS — M25561 Pain in right knee: Secondary | ICD-10-CM | POA: Diagnosis not present

## 2023-05-21 DIAGNOSIS — M79604 Pain in right leg: Secondary | ICD-10-CM | POA: Diagnosis not present

## 2023-05-21 DIAGNOSIS — M5416 Radiculopathy, lumbar region: Secondary | ICD-10-CM | POA: Diagnosis not present

## 2023-05-21 DIAGNOSIS — S83241A Other tear of medial meniscus, current injury, right knee, initial encounter: Secondary | ICD-10-CM | POA: Diagnosis not present

## 2023-05-21 DIAGNOSIS — H8109 Meniere's disease, unspecified ear: Secondary | ICD-10-CM | POA: Diagnosis not present

## 2023-05-21 DIAGNOSIS — R531 Weakness: Secondary | ICD-10-CM | POA: Diagnosis not present

## 2023-05-22 DIAGNOSIS — M5416 Radiculopathy, lumbar region: Secondary | ICD-10-CM | POA: Diagnosis not present

## 2023-05-30 DIAGNOSIS — S3981XD Other specified injuries of abdomen, subsequent encounter: Secondary | ICD-10-CM | POA: Diagnosis not present

## 2023-05-31 DIAGNOSIS — Z23 Encounter for immunization: Secondary | ICD-10-CM | POA: Diagnosis not present

## 2023-06-03 DIAGNOSIS — M79604 Pain in right leg: Secondary | ICD-10-CM | POA: Diagnosis not present

## 2023-06-03 DIAGNOSIS — R531 Weakness: Secondary | ICD-10-CM | POA: Diagnosis not present

## 2023-06-03 DIAGNOSIS — M5416 Radiculopathy, lumbar region: Secondary | ICD-10-CM | POA: Diagnosis not present

## 2023-06-24 DIAGNOSIS — R531 Weakness: Secondary | ICD-10-CM | POA: Diagnosis not present

## 2023-06-24 DIAGNOSIS — M79604 Pain in right leg: Secondary | ICD-10-CM | POA: Diagnosis not present

## 2023-06-24 DIAGNOSIS — M5416 Radiculopathy, lumbar region: Secondary | ICD-10-CM | POA: Diagnosis not present

## 2023-07-01 DIAGNOSIS — L988 Other specified disorders of the skin and subcutaneous tissue: Secondary | ICD-10-CM | POA: Diagnosis not present

## 2023-07-01 DIAGNOSIS — M79604 Pain in right leg: Secondary | ICD-10-CM | POA: Diagnosis not present

## 2023-07-01 DIAGNOSIS — R531 Weakness: Secondary | ICD-10-CM | POA: Diagnosis not present

## 2023-07-01 DIAGNOSIS — M5416 Radiculopathy, lumbar region: Secondary | ICD-10-CM | POA: Diagnosis not present

## 2023-07-02 DIAGNOSIS — M25561 Pain in right knee: Secondary | ICD-10-CM | POA: Diagnosis not present

## 2023-07-11 DIAGNOSIS — S83241A Other tear of medial meniscus, current injury, right knee, initial encounter: Secondary | ICD-10-CM | POA: Diagnosis not present

## 2023-07-11 DIAGNOSIS — M2241 Chondromalacia patellae, right knee: Secondary | ICD-10-CM | POA: Diagnosis not present

## 2023-07-14 DIAGNOSIS — G894 Chronic pain syndrome: Secondary | ICD-10-CM | POA: Diagnosis not present

## 2023-07-14 DIAGNOSIS — R1030 Lower abdominal pain, unspecified: Secondary | ICD-10-CM | POA: Diagnosis not present

## 2023-07-15 DIAGNOSIS — G894 Chronic pain syndrome: Secondary | ICD-10-CM | POA: Diagnosis not present

## 2023-07-16 DIAGNOSIS — G894 Chronic pain syndrome: Secondary | ICD-10-CM | POA: Diagnosis not present

## 2023-07-17 DIAGNOSIS — G894 Chronic pain syndrome: Secondary | ICD-10-CM | POA: Diagnosis not present

## 2023-07-18 DIAGNOSIS — G894 Chronic pain syndrome: Secondary | ICD-10-CM | POA: Diagnosis not present

## 2023-07-21 DIAGNOSIS — G894 Chronic pain syndrome: Secondary | ICD-10-CM | POA: Diagnosis not present

## 2023-07-22 DIAGNOSIS — G894 Chronic pain syndrome: Secondary | ICD-10-CM | POA: Diagnosis not present

## 2023-07-23 DIAGNOSIS — G894 Chronic pain syndrome: Secondary | ICD-10-CM | POA: Diagnosis not present

## 2023-07-24 DIAGNOSIS — G894 Chronic pain syndrome: Secondary | ICD-10-CM | POA: Diagnosis not present

## 2023-07-25 DIAGNOSIS — G894 Chronic pain syndrome: Secondary | ICD-10-CM | POA: Diagnosis not present

## 2023-07-28 DIAGNOSIS — G894 Chronic pain syndrome: Secondary | ICD-10-CM | POA: Diagnosis not present

## 2023-07-29 DIAGNOSIS — G894 Chronic pain syndrome: Secondary | ICD-10-CM | POA: Diagnosis not present

## 2023-07-30 DIAGNOSIS — G894 Chronic pain syndrome: Secondary | ICD-10-CM | POA: Diagnosis not present

## 2023-07-31 DIAGNOSIS — G894 Chronic pain syndrome: Secondary | ICD-10-CM | POA: Diagnosis not present

## 2023-08-01 DIAGNOSIS — G894 Chronic pain syndrome: Secondary | ICD-10-CM | POA: Diagnosis not present

## 2023-08-04 DIAGNOSIS — G894 Chronic pain syndrome: Secondary | ICD-10-CM | POA: Diagnosis not present

## 2023-08-05 DIAGNOSIS — G894 Chronic pain syndrome: Secondary | ICD-10-CM | POA: Diagnosis not present

## 2023-08-15 DIAGNOSIS — M25561 Pain in right knee: Secondary | ICD-10-CM | POA: Diagnosis not present

## 2023-08-19 DIAGNOSIS — E7800 Pure hypercholesterolemia, unspecified: Secondary | ICD-10-CM | POA: Diagnosis not present

## 2023-08-19 DIAGNOSIS — I10 Essential (primary) hypertension: Secondary | ICD-10-CM | POA: Diagnosis not present

## 2023-08-19 DIAGNOSIS — R7303 Prediabetes: Secondary | ICD-10-CM | POA: Diagnosis not present

## 2023-08-26 DIAGNOSIS — H8109 Meniere's disease, unspecified ear: Secondary | ICD-10-CM | POA: Diagnosis not present

## 2023-08-26 DIAGNOSIS — M2560 Stiffness of unspecified joint, not elsewhere classified: Secondary | ICD-10-CM | POA: Diagnosis not present

## 2023-08-26 DIAGNOSIS — I350 Nonrheumatic aortic (valve) stenosis: Secondary | ICD-10-CM | POA: Diagnosis not present

## 2023-08-26 DIAGNOSIS — K4090 Unilateral inguinal hernia, without obstruction or gangrene, not specified as recurrent: Secondary | ICD-10-CM | POA: Diagnosis not present

## 2023-08-26 DIAGNOSIS — F339 Major depressive disorder, recurrent, unspecified: Secondary | ICD-10-CM | POA: Diagnosis not present

## 2023-08-26 DIAGNOSIS — R911 Solitary pulmonary nodule: Secondary | ICD-10-CM | POA: Diagnosis not present

## 2023-08-26 DIAGNOSIS — Z23 Encounter for immunization: Secondary | ICD-10-CM | POA: Diagnosis not present

## 2023-08-26 DIAGNOSIS — Z6825 Body mass index (BMI) 25.0-25.9, adult: Secondary | ICD-10-CM | POA: Diagnosis not present

## 2023-08-26 DIAGNOSIS — R768 Other specified abnormal immunological findings in serum: Secondary | ICD-10-CM | POA: Diagnosis not present

## 2023-08-26 DIAGNOSIS — R7303 Prediabetes: Secondary | ICD-10-CM | POA: Diagnosis not present

## 2023-08-26 DIAGNOSIS — R5383 Other fatigue: Secondary | ICD-10-CM | POA: Diagnosis not present

## 2023-08-26 DIAGNOSIS — I10 Essential (primary) hypertension: Secondary | ICD-10-CM | POA: Diagnosis not present

## 2023-08-26 DIAGNOSIS — Z0000 Encounter for general adult medical examination without abnormal findings: Secondary | ICD-10-CM | POA: Diagnosis not present

## 2023-08-26 DIAGNOSIS — H8103 Meniere's disease, bilateral: Secondary | ICD-10-CM | POA: Diagnosis not present

## 2023-08-26 DIAGNOSIS — E7800 Pure hypercholesterolemia, unspecified: Secondary | ICD-10-CM | POA: Diagnosis not present

## 2023-08-26 DIAGNOSIS — E663 Overweight: Secondary | ICD-10-CM | POA: Diagnosis not present

## 2023-08-26 DIAGNOSIS — K219 Gastro-esophageal reflux disease without esophagitis: Secondary | ICD-10-CM | POA: Diagnosis not present

## 2023-10-03 DIAGNOSIS — L570 Actinic keratosis: Secondary | ICD-10-CM | POA: Diagnosis not present

## 2023-11-03 DIAGNOSIS — J40 Bronchitis, not specified as acute or chronic: Secondary | ICD-10-CM | POA: Diagnosis not present

## 2023-11-11 DIAGNOSIS — J201 Acute bronchitis due to Hemophilus influenzae: Secondary | ICD-10-CM | POA: Diagnosis not present

## 2023-11-11 DIAGNOSIS — H9212 Otorrhea, left ear: Secondary | ICD-10-CM | POA: Diagnosis not present

## 2023-11-11 DIAGNOSIS — R5383 Other fatigue: Secondary | ICD-10-CM | POA: Diagnosis not present

## 2023-11-11 DIAGNOSIS — R051 Acute cough: Secondary | ICD-10-CM | POA: Diagnosis not present

## 2023-11-11 DIAGNOSIS — D84821 Immunodeficiency due to drugs: Secondary | ICD-10-CM | POA: Diagnosis not present

## 2023-11-20 DIAGNOSIS — L821 Other seborrheic keratosis: Secondary | ICD-10-CM | POA: Diagnosis not present

## 2023-11-20 DIAGNOSIS — R972 Elevated prostate specific antigen [PSA]: Secondary | ICD-10-CM | POA: Diagnosis not present

## 2023-11-20 DIAGNOSIS — L570 Actinic keratosis: Secondary | ICD-10-CM | POA: Diagnosis not present

## 2023-11-20 DIAGNOSIS — L814 Other melanin hyperpigmentation: Secondary | ICD-10-CM | POA: Diagnosis not present

## 2023-11-20 DIAGNOSIS — N138 Other obstructive and reflux uropathy: Secondary | ICD-10-CM | POA: Diagnosis not present

## 2023-11-20 DIAGNOSIS — Z85828 Personal history of other malignant neoplasm of skin: Secondary | ICD-10-CM | POA: Diagnosis not present

## 2023-11-20 DIAGNOSIS — D225 Melanocytic nevi of trunk: Secondary | ICD-10-CM | POA: Diagnosis not present

## 2023-11-20 DIAGNOSIS — D229 Melanocytic nevi, unspecified: Secondary | ICD-10-CM | POA: Diagnosis not present

## 2023-11-20 DIAGNOSIS — D1801 Hemangioma of skin and subcutaneous tissue: Secondary | ICD-10-CM | POA: Diagnosis not present

## 2023-11-20 DIAGNOSIS — D0359 Melanoma in situ of other part of trunk: Secondary | ICD-10-CM | POA: Diagnosis not present

## 2023-11-20 DIAGNOSIS — N401 Enlarged prostate with lower urinary tract symptoms: Secondary | ICD-10-CM | POA: Diagnosis not present

## 2023-11-20 DIAGNOSIS — Z86018 Personal history of other benign neoplasm: Secondary | ICD-10-CM | POA: Diagnosis not present

## 2023-11-20 DIAGNOSIS — L578 Other skin changes due to chronic exposure to nonionizing radiation: Secondary | ICD-10-CM | POA: Diagnosis not present

## 2023-11-20 DIAGNOSIS — Z86006 Personal history of melanoma in-situ: Secondary | ICD-10-CM | POA: Diagnosis not present

## 2023-11-20 DIAGNOSIS — D485 Neoplasm of uncertain behavior of skin: Secondary | ICD-10-CM | POA: Diagnosis not present

## 2023-12-03 DIAGNOSIS — Z6825 Body mass index (BMI) 25.0-25.9, adult: Secondary | ICD-10-CM | POA: Diagnosis not present

## 2023-12-03 DIAGNOSIS — M2560 Stiffness of unspecified joint, not elsewhere classified: Secondary | ICD-10-CM | POA: Diagnosis not present

## 2023-12-03 DIAGNOSIS — H8109 Meniere's disease, unspecified ear: Secondary | ICD-10-CM | POA: Diagnosis not present

## 2023-12-03 DIAGNOSIS — E663 Overweight: Secondary | ICD-10-CM | POA: Diagnosis not present

## 2023-12-03 DIAGNOSIS — R768 Other specified abnormal immunological findings in serum: Secondary | ICD-10-CM | POA: Diagnosis not present

## 2023-12-03 DIAGNOSIS — R5383 Other fatigue: Secondary | ICD-10-CM | POA: Diagnosis not present

## 2023-12-04 DIAGNOSIS — H6992 Unspecified Eustachian tube disorder, left ear: Secondary | ICD-10-CM | POA: Diagnosis not present

## 2023-12-04 DIAGNOSIS — H8103 Meniere's disease, bilateral: Secondary | ICD-10-CM | POA: Diagnosis not present

## 2023-12-04 DIAGNOSIS — Z4589 Encounter for adjustment and management of other implanted devices: Secondary | ICD-10-CM | POA: Diagnosis not present

## 2023-12-04 DIAGNOSIS — H7312 Chronic myringitis, left ear: Secondary | ICD-10-CM | POA: Diagnosis not present

## 2023-12-04 DIAGNOSIS — H9313 Tinnitus, bilateral: Secondary | ICD-10-CM | POA: Diagnosis not present

## 2023-12-04 DIAGNOSIS — H90A21 Sensorineural hearing loss, unilateral, right ear, with restricted hearing on the contralateral side: Secondary | ICD-10-CM | POA: Diagnosis not present

## 2023-12-04 DIAGNOSIS — H903 Sensorineural hearing loss, bilateral: Secondary | ICD-10-CM | POA: Diagnosis not present

## 2023-12-04 DIAGNOSIS — R42 Dizziness and giddiness: Secondary | ICD-10-CM | POA: Diagnosis not present

## 2023-12-05 DIAGNOSIS — L570 Actinic keratosis: Secondary | ICD-10-CM | POA: Diagnosis not present

## 2023-12-10 DIAGNOSIS — H0279 Other degenerative disorders of eyelid and periocular area: Secondary | ICD-10-CM | POA: Diagnosis not present

## 2023-12-10 DIAGNOSIS — D0359 Melanoma in situ of other part of trunk: Secondary | ICD-10-CM | POA: Diagnosis not present

## 2023-12-10 DIAGNOSIS — Z09 Encounter for follow-up examination after completed treatment for conditions other than malignant neoplasm: Secondary | ICD-10-CM | POA: Diagnosis not present

## 2023-12-10 DIAGNOSIS — H04411 Chronic dacryocystitis of right lacrimal passage: Secondary | ICD-10-CM | POA: Diagnosis not present

## 2023-12-10 DIAGNOSIS — H04222 Epiphora due to insufficient drainage, left lacrimal gland: Secondary | ICD-10-CM | POA: Diagnosis not present

## 2023-12-10 DIAGNOSIS — H04552 Acquired stenosis of left nasolacrimal duct: Secondary | ICD-10-CM | POA: Diagnosis not present

## 2023-12-10 DIAGNOSIS — H04412 Chronic dacryocystitis of left lacrimal passage: Secondary | ICD-10-CM | POA: Diagnosis not present

## 2023-12-10 DIAGNOSIS — H04123 Dry eye syndrome of bilateral lacrimal glands: Secondary | ICD-10-CM | POA: Diagnosis not present

## 2023-12-18 DIAGNOSIS — Z981 Arthrodesis status: Secondary | ICD-10-CM | POA: Diagnosis not present

## 2023-12-18 DIAGNOSIS — H903 Sensorineural hearing loss, bilateral: Secondary | ICD-10-CM | POA: Diagnosis not present

## 2023-12-18 DIAGNOSIS — H9313 Tinnitus, bilateral: Secondary | ICD-10-CM | POA: Diagnosis not present

## 2023-12-18 DIAGNOSIS — H90A21 Sensorineural hearing loss, unilateral, right ear, with restricted hearing on the contralateral side: Secondary | ICD-10-CM | POA: Diagnosis not present

## 2023-12-18 DIAGNOSIS — H9312 Tinnitus, left ear: Secondary | ICD-10-CM | POA: Diagnosis not present

## 2023-12-18 DIAGNOSIS — H90A32 Mixed conductive and sensorineural hearing loss, unilateral, left ear with restricted hearing on the contralateral side: Secondary | ICD-10-CM | POA: Diagnosis not present

## 2023-12-18 DIAGNOSIS — Z9622 Myringotomy tube(s) status: Secondary | ICD-10-CM | POA: Diagnosis not present

## 2023-12-18 DIAGNOSIS — H04203 Unspecified epiphora, bilateral lacrimal glands: Secondary | ICD-10-CM | POA: Diagnosis not present

## 2023-12-18 DIAGNOSIS — Z0110 Encounter for examination of ears and hearing without abnormal findings: Secondary | ICD-10-CM | POA: Diagnosis not present

## 2023-12-18 DIAGNOSIS — Z79899 Other long term (current) drug therapy: Secondary | ICD-10-CM | POA: Diagnosis not present

## 2023-12-18 DIAGNOSIS — Z4589 Encounter for adjustment and management of other implanted devices: Secondary | ICD-10-CM | POA: Diagnosis not present

## 2023-12-18 DIAGNOSIS — H6992 Unspecified Eustachian tube disorder, left ear: Secondary | ICD-10-CM | POA: Diagnosis not present

## 2023-12-18 DIAGNOSIS — H8103 Meniere's disease, bilateral: Secondary | ICD-10-CM | POA: Diagnosis not present

## 2023-12-19 DIAGNOSIS — L570 Actinic keratosis: Secondary | ICD-10-CM | POA: Diagnosis not present

## 2024-01-01 ENCOUNTER — Ambulatory Visit: Payer: Medicare Other | Attending: Cardiovascular Disease | Admitting: Cardiovascular Disease

## 2024-01-01 ENCOUNTER — Encounter: Payer: Self-pay | Admitting: Cardiovascular Disease

## 2024-01-01 VITALS — BP 108/58 | HR 95 | Ht 68.0 in | Wt 168.8 lb

## 2024-01-01 DIAGNOSIS — E782 Mixed hyperlipidemia: Secondary | ICD-10-CM | POA: Diagnosis not present

## 2024-01-01 DIAGNOSIS — I480 Paroxysmal atrial fibrillation: Secondary | ICD-10-CM | POA: Diagnosis not present

## 2024-01-01 DIAGNOSIS — M5416 Radiculopathy, lumbar region: Secondary | ICD-10-CM | POA: Diagnosis not present

## 2024-01-01 DIAGNOSIS — I350 Nonrheumatic aortic (valve) stenosis: Secondary | ICD-10-CM | POA: Insufficient documentation

## 2024-01-01 MED ORDER — ROSUVASTATIN CALCIUM 20 MG PO TABS: 20.0000 mg | ORAL_TABLET | Freq: Every day | ORAL | 3 refills | Status: DC

## 2024-01-01 MED ORDER — METOPROLOL TARTRATE 25 MG PO TABS: 25.0000 mg | ORAL_TABLET | Freq: Two times a day (BID) | ORAL | 0 refills | Status: DC

## 2024-01-01 MED ORDER — APIXABAN 5 MG PO TABS: 5.0000 mg | ORAL_TABLET | Freq: Two times a day (BID) | ORAL | 0 refills | Status: DC

## 2024-01-01 NOTE — Assessment & Plan Note (Signed)
 No recent symptoms at all.  He has self discontinued anticoagulation.  With his upcoming international travel, I have written him a prescription for apixaban  and metoprolol  just in case he has recurrent atrial fibrillation which is always been associated with symptoms in the past.  We have discussed watchman implantation in the past but he was not inclined to proceed.

## 2024-01-01 NOTE — Assessment & Plan Note (Signed)
 Last LDL cholesterol was 107.  I am not sure that he was compliant with simvastatin  at the time.  I recommended him to start rosuvastatin  20 mg daily to get him on a high intensity statin drug.  He will discontinue simvastatin .

## 2024-01-01 NOTE — Assessment & Plan Note (Signed)
 The patient has moderate to severe aortic stenosis.  His LVEF is normal and the aortic valve is severely calcified and thickened but the mean gradient is only 31 mmHg with a peak transaortic velocity of 3.5 m/s.  The calculated aortic valve area is between 0.8 0.9 cm.  Dimensionless index is 0.28.  He reports to be asymptomatic with the exception of this 1 week period where he experienced some exertional chest discomfort.  I talked to him about consideration of a cardiac catheterization today, but he declines this.  He prefers to continue with surveillance of his aortic stenosis and I will set him up for an echocardiogram in 6 months followed by an office visit.  He understands to seek immediate medical attention if he develops any progressive chest tightness, shortness of breath, or other symptoms that could be associated with aortic stenosis.

## 2024-01-01 NOTE — Patient Instructions (Addendum)
 Medication Instructions:  Eliquis  and Metoprolol  Tartrate sent to pharmacy on file STOP Simvastatin  START Rosuvastatin  20mg  daily  Labs: Lipids, Liver in 6 months  Testing/Procedures: ECHO (in 6 months, prior to appt) Your physician has requested that you have an echocardiogram. Echocardiography is a painless test that uses sound waves to create images of your heart. It provides your doctor with information about the size and shape of your heart and how well your heart's chambers and valves are working. This procedure takes approximately one hour. There are no restrictions for this procedure. Please do NOT wear cologne, perfume, aftershave, or lotions (deodorant is allowed). Please arrive 15 minutes prior to your appointment time.  Please note: We ask at that you not bring children with you during ultrasound (echo/ vascular) testing. Due to room size and safety concerns, children are not allowed in the ultrasound rooms during exams. Our front office staff cannot provide observation of children in our lobby area while testing is being conducted. An adult accompanying a patient to their appointment will only be allowed in the ultrasound room at the discretion of the ultrasound technician under special circumstances. We apologize for any inconvenience.  Follow-Up: At [facility], you and your health needs are our priority.  As part of our continuing mission to provide you with exceptional heart care, our providers are all part of one team.  This team includes your primary Cardiologist (physician) and Advanced Practice Providers or APPs (Physician Assistants and Nurse Practitioners) who all work together to provide you with the care you need, when you need it.  Your next appointment:   6 month(s)  Provider:   Arnoldo Lapping, MD        1st Floor: - Lobby - Registration  - Pharmacy  - Lab - Cafe  2nd Floor: - PV Lab - Diagnostic Testing (echo, CT, nuclear med)  3rd Floor: -  Vacant  4th Floor: - TCTS (cardiothoracic surgery) - AFib Clinic - Structural Heart Clinic - Vascular Surgery  - Vascular Ultrasound  5th Floor: - HeartCare Cardiology (general and EP) - Clinical Pharmacy for coumadin, hypertension, lipid, weight-loss medications, and med management appointments    Valet parking services will be available as well.

## 2024-01-01 NOTE — Progress Notes (Signed)
 Cardiology Office Note:    Date:  01/01/2024   ID:  Cody Mcconnell, DOB [birth year], MRN [id number]  PCP:  Yvonnie Heritage, NP   [facility] Providers Cardiologist:  Arnoldo Lapping, MD     Referring MD: Yvonnie Heritage, NP   Chief Complaint  Patient presents with   Follow-up    Aortic stenosis    History of Present Illness:    Cody Mcconnell is a 78 y.o. male with a hx of paroxysmal atrial fibrillation and moderate aortic stenosis, presenting for follow-up evaluation.  Comorbid conditions include coronary artery calcification, hypertension, and mixed hyperlipidemia.     The patient is here alone today.  He states that several months ago he had about 1 week of chest discomfort that occurred while he was walking.  States this was centralized and describes it as a pressure-like sensation.  It resolved on its own.  He has had no further recurrence and states that he is completely asymptomatic with activities like swimming.  He denies shortness of breath, heart palpitations, edema, lightheadedness, orthopnea, or PND.  He is about to take a month-long trip to [place] and wanted to have some medication in case he develops recurrent atrial fibrillation.  He is off of all anticoagulant drugs as he is had very isolated episodes of atrial fibrillation associated with medical illnesses in the past.  Current Medications: Current Meds  Medication Sig   acetaminophen  (TYLENOL ) 325 MG tablet Take 2 tablets (650 mg total) by mouth every 6 (six) hours as needed for mild pain or fever.   apixaban  (ELIQUIS ) 5 MG TABS tablet Take 1 tablet (5 mg total) by mouth 2 (two) times daily.   cetirizine (ZYRTEC) 10 MG tablet Take 10 mg by mouth daily.   diclofenac Sodium (VOLTAREN) 1 % GEL Apply 1 Application topically 2 (two) times daily as needed (joint pain).   fluticasone  (FLONASE ) 50 MCG/ACT nasal spray Place 1 spray into both nostrils daily as needed for allergies or rhinitis.   methotrexate  (RHEUMATREX) 7.5 MG tablet Take 7.5 mg by mouth once a week. Caution" Chemotherapy. Protect from light.   metoprolol  tartrate (LOPRESSOR ) 25 MG tablet Take 1 tablet (25 mg total) by mouth 2 (two) times daily.   montelukast  (SINGULAIR ) 10 MG tablet Take 1 tablet (10 mg total) by mouth daily as needed (for allergies).   rosuvastatin  (CRESTOR ) 20 MG tablet Take 1 tablet (20 mg total) by mouth daily.   sildenafil (VIAGRA) 100 MG tablet Take 100 mg by mouth daily as needed for erectile dysfunction.   triamterene -hydrochlorothiazide  (DYAZIDE ) 37.5-25 MG capsule Take 1 each (1 capsule total) by mouth every morning.   [DISCONTINUED] simvastatin  (ZOCOR ) 40 MG tablet Take 40 mg by mouth daily.     Allergies:   Patient has no known allergies.   ROS:   Please see the history of present illness.    All other systems reviewed and are negative.  EKGs/Labs/Other Studies Reviewed:    The following studies were reviewed today: Cardiac Studies & Procedures   ______________________________________________________________________________________________   STRESS TESTS  MYOCARDIAL PERFUSION IMAGING 05/08/2023  Narrative   Findings are consistent with small area of anteroapical infarction. The study is low risk.   No ST deviation was noted.   LV perfusion is abnormal. There is no evidence of ischemia. There is evidence of infarction. Defect 1: There is a small defect with moderate reduction in uptake present in the apical to mid anteroseptal location(s) that is fixed. There is normal  wall motion in the defect area. Consistent with infarction.   Left ventricular function is normal. EF 57% End diastolic cavity size is normal. End systolic cavity size is normal.   Prior study not available for comparison.   ECHOCARDIOGRAM  ECHOCARDIOGRAM COMPLETE 04/02/2023  Narrative ECHOCARDIOGRAM REPORT    Patient Name:   Cody Mcconnell Date of Exam: 04/02/2023 Medical Rec #:  [id number]          Height:        68.0 in Accession #:    [id number]         Weight:       170.0 lb Date of Birth:  11-07-1945          BSA:          1.907 m Patient Age:    77 years           BP:           110/72 mmHg Patient Gender: M                  HR:           86 bpm. Exam Location:  Inpatient  Procedure: 2D Echo, Color Doppler and Cardiac Doppler  Indications:    NSTEMI  History:        Patient has prior history of Echocardiogram examinations, most recent 02/10/2023. Hypokalemia, NSTEMI, AKI, Aortic Valve Disease, Arrythmias:Atrial Fibrillation; Risk Factors:Dyslipidemia.  Sonographer:    Melissa Kafa Referring Phys: ANASTASSIA DOUTOVA  IMPRESSIONS   1. Left ventricular ejection fraction, by estimation, is 55 to 60%. The left ventricle has normal function. The left ventricle has no regional wall motion abnormalities. There is mild left ventricular hypertrophy. Left ventricular diastolic parameters were normal. 2. Right ventricular systolic function is normal. The right ventricular size is normal. 3. Left atrial size was mildly dilated. 4. The mitral valve is normal in structure. Mild mitral valve regurgitation. No evidence of mitral stenosis. 5. The aortic valve is calcified. There is severe calcifcation of the aortic valve. There is severe thickening of the aortic valve. Aortic valve regurgitation is trivial. Moderate to severe aortic valve stenosis. Aortic valve area, by VTI measures 0.80 cm. Aortic valve mean gradient measures 31.2 mmHg. Aortic valve Vmax measures 3.54 m/s. 6. The inferior vena cava is normal in size with greater than 50% respiratory variability, suggesting right atrial pressure of 3 mmHg.  Comparison(s): No significant change from prior study.  FINDINGS Left Ventricle: Left ventricular ejection fraction, by estimation, is 55 to 60%. The left ventricle has normal function. The left ventricle has no regional wall motion abnormalities. The left ventricular internal cavity size was normal in  size. There is mild left ventricular hypertrophy. Left ventricular diastolic parameters were normal.  Right Ventricle: The right ventricular size is normal. No increase in right ventricular wall thickness. Right ventricular systolic function is normal.  Left Atrium: Left atrial size was mildly dilated.  Right Atrium: Right atrial size was normal in size.  Pericardium: There is no evidence of pericardial effusion.  Mitral Valve: The mitral valve is normal in structure. Mild mitral valve regurgitation. No evidence of mitral valve stenosis.  Tricuspid Valve: The tricuspid valve is normal in structure. Tricuspid valve regurgitation is trivial. No evidence of tricuspid stenosis.  Aortic Valve: The aortic valve is calcified. There is severe calcifcation of the aortic valve. There is severe thickening of the aortic valve. Aortic valve regurgitation is trivial. Moderate to severe aortic stenosis is present. Aortic  valve mean gradient measures 31.2 mmHg. Aortic valve peak gradient measures 50.2 mmHg. Aortic valve area, by VTI measures 0.80 cm.  Pulmonic Valve: The pulmonic valve was not well visualized. Pulmonic valve regurgitation is not visualized. No evidence of pulmonic stenosis.  Aorta: The aortic root and ascending aorta are structurally normal, with no evidence of dilitation.  Venous: The inferior vena cava is normal in size with greater than 50% respiratory variability, suggesting right atrial pressure of 3 mmHg.  IAS/Shunts: The atrial septum is grossly normal.   LEFT VENTRICLE PLAX 2D LVIDd:         4.90 cm   Diastology LVIDs:         3.30 cm   LV e' medial:    9.57 cm/s LV PW:         1.20 cm   LV E/e' medial:  9.4 LV IVS:        1.20 cm   LV e' lateral:   12.50 cm/s LVOT diam:     1.90 cm   LV E/e' lateral: 7.2 LV SV:         60 LV SV Index:   32 LVOT Area:     2.84 cm   RIGHT VENTRICLE RV S prime:     15.20 cm/s TAPSE (M-mode): 2.0 cm  LEFT ATRIUM             Index         RIGHT ATRIUM           Index LA diam:        4.60 cm 2.41 cm/m   RA Area:     13.10 cm LA Vol (A2C):   40.5 ml 21.23 ml/m  RA Volume:   29.20 ml  15.31 ml/m LA Vol (A4C):   59.5 ml 31.20 ml/m LA Biplane Vol: 51.1 ml 26.79 ml/m AORTIC VALVE AV Area (Vmax):    0.87 cm AV Area (Vmean):   0.82 cm AV Area (VTI):     0.80 cm AV Vmax:           354.20 cm/s AV Vmean:          265.200 cm/s AV VTI:            0.754 m AV Peak Grad:      50.2 mmHg AV Mean Grad:      31.2 mmHg LVOT Vmax:         109.00 cm/s LVOT Vmean:        76.500 cm/s LVOT VTI:          0.212 m LVOT/AV VTI ratio: 0.28  AORTA Ao Root diam: 3.10 cm Ao Asc diam:  3.10 cm  MITRAL VALVE MV Area (PHT): 4.17 cm    SHUNTS MV Decel Time: 182 msec    Systemic VTI:  0.21 m MV E velocity: 90.10 cm/s  Systemic Diam: 1.90 cm MV A velocity: 82.80 cm/s MV E/A ratio:  1.09  Sheryle Donning MD Electronically signed by Sheryle Donning MD Signature Date/Time: 04/02/2023/12:02:21 PM    Final    MONITORS  CARDIAC EVENT MONITOR 05/26/2020       ______________________________________________________________________________________________      EKG:        Recent Labs: 04/01/2023: TSH 2.767 04/04/2023: Magnesium  1.8 04/06/2023: ALT 31 04/09/2023: BUN 19; Creatinine, Ser 1.02; Hemoglobin 12.6; Platelets 345; Potassium 4.0; Sodium 138  Recent Lipid Panel No results found for: "CHOL", "TRIG", "HDL", "CHOLHDL", "VLDL", "LDLCALC", "LDLDIRECT"   Risk Assessment/Calculations:  CHA2DS2-VASc Score = 3   This indicates a 3.2% annual risk of stroke. The patient's score is based upon: CHF History: 0 HTN History: 1 Diabetes History: 0 Stroke History: 0 Vascular Disease History: 0 Age Score: 2 Gender Score: 0               Physical Exam:    VS:  BP (!) 108/58   Pulse 95   Ht 5\' 8"  (1.727 m)   Wt 168 lb 12.8 oz (76.6 kg)   SpO2 95%   BMI 25.67 kg/m     Wt Readings from Last 3 Encounters:   01/01/24 168 lb 12.8 oz (76.6 kg)  05/08/23 169 lb (76.7 kg)  04/30/23 169 lb 6.4 oz (76.8 kg)     GEN:  Well nourished, well developed in no acute distress HEENT: Normal NECK: No JVD; bilateral carotid bruits, likely heart murmur radiation LYMPHATICS: No lymphadenopathy CARDIAC: RRR, 2/6 systolic crescendo decrescendo murmur at the right upper sternal border, late peaking but A2 is present RESPIRATORY:  Clear to auscultation without rales, wheezing or rhonchi  ABDOMEN: Soft, non-tender, non-distended MUSCULOSKELETAL:  No edema; No deformity  SKIN: Warm and dry NEUROLOGIC:  Alert and oriented x 3 PSYCHIATRIC:  Normal affect   Assessment & Plan Nonrheumatic aortic valve stenosis The patient has moderate to severe aortic stenosis.  His LVEF is normal and the aortic valve is severely calcified and thickened but the mean gradient is only 31 mmHg with a peak transaortic velocity of 3.5 m/s.  The calculated aortic valve area is between 0.8 0.9 cm.  Dimensionless index is 0.28.  He reports to be asymptomatic with the exception of this 1 week period where he experienced some exertional chest discomfort.  I talked to him about consideration of a cardiac catheterization today, but he declines this.  He prefers to continue with surveillance of his aortic stenosis and I will set him up for an echocardiogram in 6 months followed by an office visit.  He understands to seek immediate medical attention if he develops any progressive chest tightness, shortness of breath, or other symptoms that could be associated with aortic stenosis. Paroxysmal atrial fibrillation (HCC) No recent symptoms at all.  He has self discontinued anticoagulation.  With his upcoming international travel, I have written him a prescription for apixaban  and metoprolol  just in case he has recurrent atrial fibrillation which is always been associated with symptoms in the past.  We have discussed watchman implantation in the past but he  was not inclined to proceed. Mixed hyperlipidemia Last LDL cholesterol was 107.  I am not sure that he was compliant with simvastatin  at the time.  I recommended him to start rosuvastatin  20 mg daily to get him on a high intensity statin drug.  He will discontinue simvastatin .            Medication Adjustments/Labs and Tests Ordered: Current medicines are reviewed at length with the patient today.  Concerns regarding medicines are outlined above.  Orders Placed This Encounter  Procedures   Lipid panel   Hepatic function panel   ECHOCARDIOGRAM LIMITED   Meds ordered this encounter  Medications   metoprolol  tartrate (LOPRESSOR ) 25 MG tablet    Sig: Take 1 tablet (25 mg total) by mouth 2 (two) times daily.    Dispense:  60 tablet    Refill:  0   apixaban  (ELIQUIS ) 5 MG TABS tablet    Sig: Take 1 tablet (5 mg total) by mouth 2 (two)  times daily.    Dispense:  60 tablet    Refill:  0   rosuvastatin  (CRESTOR ) 20 MG tablet    Sig: Take 1 tablet (20 mg total) by mouth daily.    Dispense:  90 tablet    Refill:  3    Patient Instructions  Medication Instructions:  Eliquis  and Metoprolol  Tartrate sent to pharmacy on file STOP Simvastatin  START Rosuvastatin  20mg  daily  Labs: Lipids, Liver in 6 months  Testing/Procedures: ECHO (in 6 months, prior to appt) Your physician has requested that you have an echocardiogram. Echocardiography is a painless test that uses sound waves to create images of your heart. It provides your doctor with information about the size and shape of your heart and how well your heart's chambers and valves are working. This procedure takes approximately one hour. There are no restrictions for this procedure. Please do NOT wear cologne, perfume, aftershave, or lotions (deodorant is allowed). Please arrive 15 minutes prior to your appointment time.  Please note: We ask at that you not bring children with you during ultrasound (echo/ vascular) testing. Due to  room size and safety concerns, children are not allowed in the ultrasound rooms during exams. Our front office staff cannot provide observation of children in our lobby area while testing is being conducted. An adult accompanying a patient to their appointment will only be allowed in the ultrasound room at the discretion of the ultrasound technician under special circumstances. We apologize for any inconvenience.  Follow-Up: At [facility], you and your health needs are our priority.  As part of our continuing mission to provide you with exceptional heart care, our providers are all part of one team.  This team includes your primary Cardiologist (physician) and Advanced Practice Providers or APPs (Physician Assistants and Nurse Practitioners) who all work together to provide you with the care you need, when you need it.  Your next appointment:   6 month(s)  Provider:   Arnoldo Lapping, MD        1st Floor: - Lobby - Registration  - Pharmacy  - Lab - Cafe  2nd Floor: - PV Lab - Diagnostic Testing (echo, CT, nuclear med)  3rd Floor: - Vacant  4th Floor: - TCTS (cardiothoracic surgery) - AFib Clinic - Structural Heart Clinic - Vascular Surgery  - Vascular Ultrasound  5th Floor: - HeartCare Cardiology (general and EP) - Clinical Pharmacy for coumadin, hypertension, lipid, weight-loss medications, and med management appointments    Valet parking services will be available as well.     Signed, Arnoldo Lapping, MD  01/01/2024 12:52 PM    [facility]

## 2024-01-02 DIAGNOSIS — H0102A Squamous blepharitis right eye, upper and lower eyelids: Secondary | ICD-10-CM | POA: Diagnosis not present

## 2024-01-02 DIAGNOSIS — H16223 Keratoconjunctivitis sicca, not specified as Sjogren's, bilateral: Secondary | ICD-10-CM | POA: Diagnosis not present

## 2024-01-02 DIAGNOSIS — H0288B Meibomian gland dysfunction left eye, upper and lower eyelids: Secondary | ICD-10-CM | POA: Diagnosis not present

## 2024-01-02 DIAGNOSIS — H0279 Other degenerative disorders of eyelid and periocular area: Secondary | ICD-10-CM | POA: Diagnosis not present

## 2024-01-02 DIAGNOSIS — H0288A Meibomian gland dysfunction right eye, upper and lower eyelids: Secondary | ICD-10-CM | POA: Diagnosis not present

## 2024-01-02 DIAGNOSIS — H0102B Squamous blepharitis left eye, upper and lower eyelids: Secondary | ICD-10-CM | POA: Diagnosis not present

## 2024-02-17 DIAGNOSIS — Z9289 Personal history of other medical treatment: Secondary | ICD-10-CM | POA: Diagnosis not present

## 2024-02-17 DIAGNOSIS — E7800 Pure hypercholesterolemia, unspecified: Secondary | ICD-10-CM | POA: Diagnosis not present

## 2024-02-17 DIAGNOSIS — L570 Actinic keratosis: Secondary | ICD-10-CM | POA: Diagnosis not present

## 2024-02-17 DIAGNOSIS — L739 Follicular disorder, unspecified: Secondary | ICD-10-CM | POA: Diagnosis not present

## 2024-02-17 DIAGNOSIS — R7303 Prediabetes: Secondary | ICD-10-CM | POA: Diagnosis not present

## 2024-02-24 DIAGNOSIS — F339 Major depressive disorder, recurrent, unspecified: Secondary | ICD-10-CM | POA: Diagnosis not present

## 2024-02-24 DIAGNOSIS — R911 Solitary pulmonary nodule: Secondary | ICD-10-CM | POA: Diagnosis not present

## 2024-02-24 DIAGNOSIS — I10 Essential (primary) hypertension: Secondary | ICD-10-CM | POA: Diagnosis not present

## 2024-02-24 DIAGNOSIS — I350 Nonrheumatic aortic (valve) stenosis: Secondary | ICD-10-CM | POA: Diagnosis not present

## 2024-02-24 DIAGNOSIS — G8929 Other chronic pain: Secondary | ICD-10-CM | POA: Diagnosis not present

## 2024-02-24 DIAGNOSIS — K4090 Unilateral inguinal hernia, without obstruction or gangrene, not specified as recurrent: Secondary | ICD-10-CM | POA: Diagnosis not present

## 2024-02-24 DIAGNOSIS — R972 Elevated prostate specific antigen [PSA]: Secondary | ICD-10-CM | POA: Diagnosis not present

## 2024-02-24 DIAGNOSIS — E7800 Pure hypercholesterolemia, unspecified: Secondary | ICD-10-CM | POA: Diagnosis not present

## 2024-02-24 DIAGNOSIS — Z8679 Personal history of other diseases of the circulatory system: Secondary | ICD-10-CM | POA: Diagnosis not present

## 2024-02-24 DIAGNOSIS — K219 Gastro-esophageal reflux disease without esophagitis: Secondary | ICD-10-CM | POA: Diagnosis not present

## 2024-02-24 DIAGNOSIS — R7303 Prediabetes: Secondary | ICD-10-CM | POA: Diagnosis not present

## 2024-02-24 DIAGNOSIS — H8103 Meniere's disease, bilateral: Secondary | ICD-10-CM | POA: Diagnosis not present

## 2024-03-02 DIAGNOSIS — R5383 Other fatigue: Secondary | ICD-10-CM | POA: Diagnosis not present

## 2024-03-02 DIAGNOSIS — H8109 Meniere's disease, unspecified ear: Secondary | ICD-10-CM | POA: Diagnosis not present

## 2024-03-02 DIAGNOSIS — R768 Other specified abnormal immunological findings in serum: Secondary | ICD-10-CM | POA: Diagnosis not present

## 2024-03-02 DIAGNOSIS — E663 Overweight: Secondary | ICD-10-CM | POA: Diagnosis not present

## 2024-03-02 DIAGNOSIS — Z6825 Body mass index (BMI) 25.0-25.9, adult: Secondary | ICD-10-CM | POA: Diagnosis not present

## 2024-04-21 DIAGNOSIS — H9202 Otalgia, left ear: Secondary | ICD-10-CM | POA: Diagnosis not present

## 2024-04-21 DIAGNOSIS — Z9109 Other allergy status, other than to drugs and biological substances: Secondary | ICD-10-CM | POA: Diagnosis not present

## 2024-05-04 DIAGNOSIS — H9202 Otalgia, left ear: Secondary | ICD-10-CM | POA: Diagnosis not present

## 2024-05-04 DIAGNOSIS — F439 Reaction to severe stress, unspecified: Secondary | ICD-10-CM | POA: Diagnosis not present

## 2024-05-17 DIAGNOSIS — R972 Elevated prostate specific antigen [PSA]: Secondary | ICD-10-CM | POA: Diagnosis not present

## 2024-05-17 DIAGNOSIS — R935 Abnormal findings on diagnostic imaging of other abdominal regions, including retroperitoneum: Secondary | ICD-10-CM | POA: Diagnosis not present

## 2024-05-27 DIAGNOSIS — M5416 Radiculopathy, lumbar region: Secondary | ICD-10-CM | POA: Diagnosis not present

## 2024-06-02 DIAGNOSIS — Z86006 Personal history of melanoma in-situ: Secondary | ICD-10-CM | POA: Diagnosis not present

## 2024-06-02 DIAGNOSIS — L570 Actinic keratosis: Secondary | ICD-10-CM | POA: Diagnosis not present

## 2024-06-02 DIAGNOSIS — D1801 Hemangioma of skin and subcutaneous tissue: Secondary | ICD-10-CM | POA: Diagnosis not present

## 2024-06-02 DIAGNOSIS — Z6825 Body mass index (BMI) 25.0-25.9, adult: Secondary | ICD-10-CM | POA: Diagnosis not present

## 2024-06-02 DIAGNOSIS — L578 Other skin changes due to chronic exposure to nonionizing radiation: Secondary | ICD-10-CM | POA: Diagnosis not present

## 2024-06-02 DIAGNOSIS — R768 Other specified abnormal immunological findings in serum: Secondary | ICD-10-CM | POA: Diagnosis not present

## 2024-06-02 DIAGNOSIS — E7800 Pure hypercholesterolemia, unspecified: Secondary | ICD-10-CM | POA: Diagnosis not present

## 2024-06-02 DIAGNOSIS — Z7184 Encounter for health counseling related to travel: Secondary | ICD-10-CM | POA: Diagnosis not present

## 2024-06-02 DIAGNOSIS — Z86018 Personal history of other benign neoplasm: Secondary | ICD-10-CM | POA: Diagnosis not present

## 2024-06-02 DIAGNOSIS — Z23 Encounter for immunization: Secondary | ICD-10-CM | POA: Diagnosis not present

## 2024-06-02 DIAGNOSIS — L821 Other seborrheic keratosis: Secondary | ICD-10-CM | POA: Diagnosis not present

## 2024-06-02 DIAGNOSIS — D229 Melanocytic nevi, unspecified: Secondary | ICD-10-CM | POA: Diagnosis not present

## 2024-06-02 DIAGNOSIS — Z85828 Personal history of other malignant neoplasm of skin: Secondary | ICD-10-CM | POA: Diagnosis not present

## 2024-06-02 DIAGNOSIS — L814 Other melanin hyperpigmentation: Secondary | ICD-10-CM | POA: Diagnosis not present

## 2024-06-02 DIAGNOSIS — R5383 Other fatigue: Secondary | ICD-10-CM | POA: Diagnosis not present

## 2024-06-02 DIAGNOSIS — H8109 Meniere's disease, unspecified ear: Secondary | ICD-10-CM | POA: Diagnosis not present

## 2024-06-02 DIAGNOSIS — E663 Overweight: Secondary | ICD-10-CM | POA: Diagnosis not present

## 2024-06-10 ENCOUNTER — Other Ambulatory Visit: Payer: Self-pay

## 2024-06-10 ENCOUNTER — Encounter: Payer: Self-pay | Admitting: Allergy

## 2024-06-10 ENCOUNTER — Ambulatory Visit: Payer: No Typology Code available for payment source | Admitting: Allergy

## 2024-06-10 VITALS — BP 118/68 | HR 86 | Temp 98.6°F | Resp 16 | Ht 69.5 in | Wt 170.8 lb

## 2024-06-10 DIAGNOSIS — M549 Dorsalgia, unspecified: Secondary | ICD-10-CM | POA: Diagnosis not present

## 2024-06-10 DIAGNOSIS — R5381 Other malaise: Secondary | ICD-10-CM

## 2024-06-10 DIAGNOSIS — J310 Chronic rhinitis: Secondary | ICD-10-CM | POA: Diagnosis not present

## 2024-06-10 DIAGNOSIS — R5383 Other fatigue: Secondary | ICD-10-CM | POA: Diagnosis not present

## 2024-06-10 DIAGNOSIS — H04203 Unspecified epiphora, bilateral lacrimal glands: Secondary | ICD-10-CM

## 2024-06-10 MED ORDER — IPRATROPIUM BROMIDE 0.03 % NA SOLN: 1.0000 | Freq: Two times a day (BID) | NASAL | 3 refills | Status: AC | PRN

## 2024-06-10 MED ORDER — FLUNISOLIDE 25 MCG/ACT (0.025%) NA SOLN: 1.0000 | Freq: Two times a day (BID) | NASAL | 3 refills | Status: AC | PRN

## 2024-06-10 NOTE — Progress Notes (Signed)
 New Patient Note  RE: Cody Mcconnell MRN: [id number] DOB: 1946/01/27 Date of Office Visit: 06/10/2024  Consult requested by: Corlis Pagan, NP Primary care provider: Corlis Pagan, NP  Chief Complaint: Establish Care (He states he has seasonal and cleaning products/ spray allergy - tired, eyes watery and back pain while allergy broke out because he had 7 back surgeries.)  History of Present Illness: I had the pleasure of seeing Cody Mcconnell for initial evaluation at the [facility] on 06/10/2024. He is a 78 y.o. male, who is referred here by Corlis Pagan, NP for the evaluation of seasonal allergies.  Discussed the use of AI scribe software for clinical note transcription with the patient, who gave verbal consent to proceed.    He experiences significant fatigue and malaise. This fatigue is severe, impacting his ability to function normally, and has been a persistent issue.  He has had excessive tearing from his eyes for the past year. He underwent surgery to address this issue, but it was not successful. The tearing worsens with exposure to allergens, particularly in different environments [place]  He experiences back pain during allergic reactions, describing a sensation of his back 'lighting up,' and believes this is related to his spinal stenosis. This pain is severe enough to prevent him from going to the gym and often requires him to rest for a day.  Additional symptoms include a stuffy nose, runny nose, sneezing, and itchy eyes, although these are less prominent than his fatigue and tearing. Symptoms are particularly severe in the spring and when exposed to certain environments, such as jungles or areas with strong perfumes and cleaning agents.  He has a long history of allergies, with symptoms worsening over the past year. He has tried various treatments, including Flonase , montelukast , Zyrtec, and Mucinex , with some relief but  not complete resolution of symptoms. He avoids environments that trigger his allergies, such as clothing stores and places with strong odors.  He had allergy testing many years ago, which showed sensitivities to various environmental allergens. He is not sure if he ever underwent AIT He also has a history of Meniere's disease and takes methotrexate weekly for this condition.  No history of asthma, food or medication allergies, or frequent infections requiring antibiotics. He reports chills during these episodes but no fevers. He has a history of a heart murmur.     Symptoms have been going on for many years but wore the past year. The symptoms are present all year around with worsening in spring. Other triggers include exposure to unknown - possibly change in location, strong odors. Headache: sometimes. He has used Flonase , Singulair , zyrtec, mucinex  with minimal improvement in symptoms. Previous work up includes: skin testing many years ago which showed some positives. Not sure about prior AIT.  Previous ENT evaluation: follows with ENT for meniere's disease.   Assessment and Plan: Cody Mcconnell is a 78 y.o. male with: Chronic rhinitis Watery eyes Malaise and fatigue Back pain Concerned about allergies as has been having fatigue, malaise, and atypical back pain with persistent watery eyes. Previous allergy testing over 30+ years ago showed multiple positives per patient. Patient had eye surgery with minimal benefit. Triggers include strong odors, scents, perfumes and change in weather/location. Tried Singulair , mucinex , Flonase  and antihistamines with unknown benefit.  Discussed with patient that his symptoms are not typical of environmental allergies.  I don't have testing for chemicals, perfumes and strong scents.  Use Atrovent (  ipratropium) 0.03% 1-2 sprays per nostril twice a day as needed for runny nose/drainage. Use Flunisolide nasal spray 1-2 sprays per nostril twice a day as needed for nasal  congestion.  Nasal saline spray (i.e., Simply Saline) or nasal saline lavage (i.e., NeilMed) is recommended as needed and prior to medicated nasal sprays. Okay to continue with Singulair  and zyrtec as before. Wean off Mucinex . Recommend seeing an ophthalmologist again for the watery eyes.  Get bloodwork for environmental allergies. If significant positives can discuss allergy immunotherapy but it may not be logistically possible as patient does not live consistently in one location.   Return in about 4 weeks (around 07/08/2024).  Meds ordered this encounter  Medications   flunisolide (NASALIDE) 25 MCG/ACT (0.025%) SOLN    Sig: Place 1-2 sprays into the nose 2 (two) times daily as needed (nasal congestion).    Dispense:  25 mL    Refill:  3   ipratropium (ATROVENT) 0.03 % nasal spray    Sig: Place 1-2 sprays into both nostrils 2 (two) times daily as needed (nasal drainage).    Dispense:  30 mL    Refill:  3   Lab Orders         Allergens w/Total IgE Area 2      Other allergy screening: Asthma: no Food allergy: no Medication allergy: no Hymenoptera allergy: no Urticaria: no Eczema:no History of recurrent infections suggestive of immunodeficency: no  Diagnostics: None.   Past Medical History: Patient Active Problem List   Diagnosis Date Noted   Cellulitis 04/07/2023   Hypophosphatemia 04/02/2023   Cellulitis of left lower extremity 04/01/2023   Atrial fibrillation with RVR (HCC) 04/01/2023   Rheumatoid arthritis (HCC) 04/01/2023   Bronchitis 04/01/2023   Hyperlipidemia 04/01/2023   AKI (acute kidney injury) 04/01/2023   Hypokalemia 04/01/2023   NSTEMI (non-ST elevated myocardial infarction) (HCC) 04/01/2023   Lumbar stenosis with neurogenic claudication 08/14/2021   Spondylolisthesis of lumbar region 06/29/2020   Overweight (BMI 25.0-29.9) 09/30/2018   S/P right THA, AA 09/29/2018   Osteoarthritis of right hip 09/21/2018   Paroxysmal atrial fibrillation (HCC)  06/05/2018   Coronary artery calcification seen on CT scan 06/05/2018   Carotid atherosclerosis, bilateral 04/30/2018   Prediabetes 04/30/2018   Mild aortic stenosis 04/30/2018   Acute right-sided low back pain with right-sided sciatica 02/04/2018   Lumbar herniated disc 01/30/2018   Aortic valve stenosis 12/19/2017   Encounter for screening colonoscopy 10/17/2017   Benign prostatic hyperplasia with urinary obstruction 08/30/2016   Elevated prostate specific antigen (PSA) 08/30/2016   History of lumbar laminectomy for spinal cord decompression 11/30/2013   S/P lumbar laminectomy 11/30/2013   Spinal stenosis of lumbar region with neurogenic claudication 09/20/2013   Right hip pain 09/20/2013   Low back pain with sciatica 06/02/2012   Past Medical History:  Diagnosis Date   Anemia    SLIGHT ANEMIA 4 MONTHS AGO   Aortic valve stenosis 12/19/2017   Echo 07/30/16 - EF 55-60, mild aortic stenosis (mean 9) // Echo 4/19:  EF 60-65 mild AS (mean 11) // Echocardiogram 11/21: EF 60-65, no RWMA, mild LVH, Gr 1 DD, normal RVSF, trivial MR, calcified AV, mod AS (mean 20 mmHg, Vmax 280 cm/s, DI 0.27), trivial AI  // Echo 5/22: EF 60-65, moderate LVH, no RWMA, trivial MR, moderate AS (mean gradient 20 mmHg, V-max 287 cm/s, DI 0.35)    Arthritis    OA   Benign prostate hyperplasia    unspecified whether lower urinary tract sym.  present    Carotid atherosclerosis    Carotid US  4/19:  bilat ICA 1-39; vertebrals and subclavians normal   Carotid atherosclerosis, bilateral 04/30/2018   1-39% on 12/2017 [facility] Echo   Coronary artery calcification seen on CT scan 06/05/2018   Dysrhythmia    ATRIAL FIB   Elevated prostate specific antigen (PSA) 08/30/2016   Glucose intolerance    pre-diabetic   Hayfever    Heart murmur    History of exercise stress test    GXT 4/19:  ETT with good exercise tolerance (10:00); no chest pain; normal BP response; no diagnostic ST changes; negative adequate ETT.    HOH (hard of hearing)    BOTH EARS   Low back pain with sciatica 06/02/2012   Lumbar herniated disc 01/30/2018   Mild aortic stenosis 04/30/2018   12/2017 [facility] Echo : Mean gradient (S): 11 mm Hg. Peak gradient (S): 24 mm Hg.   Numbness of right thumb    FROM CERVICAL NECK SURGERY   Osteoarthritis of right hip 09/21/2018   Overweight (BMI 25.0-29.9) 09/30/2018   Paroxysmal atrial fibrillation (HCC) 06/05/2018   Pre-diabetes    Prediabetes 04/30/2018   not sure about this   Prostatitis    unspecified prostatitis type   Pure hypercholesterolemia    Past Surgical History: Past Surgical History:  Procedure Laterality Date   ANTERIOR LAT LUMBAR FUSION Right 06/29/2020   Procedure: Right Lumbar Two-Three Anterolateral lumbar interbody fusion with lateral plate;  Surgeon: Unice Pac, MD;  [facility];  Service: Neurosurgery;  Laterality: Right;  Right Lumbar Two-Three Anterolateral lumbar interbody fusion with lateral plate   CERVICAL SPINE SURGERY  2011   COLONOSCOPY     HIP SURGERY Right    arthroscopy   LUMBAR DISC SURGERY  2014   L2-3, L3 TO L4 X 2 2019 AT [facility]   LUMBAR FUSION  2019   psoas muscle repair (groin muscle?)  2022   SHOULDER ARTHROSCOPY Right    TOOTH EXTRACTION     TOTAL HIP ARTHROPLASTY Right 09/29/2018   Procedure: TOTAL HIP ARTHROPLASTY ANTERIOR APPROACH;  Surgeon: Ernie Cough, MD;  Location: [facility] ORS;  Service: Orthopedics;  Laterality: Right;  71   Medication List:  Current Outpatient Medications  Medication Sig Dispense Refill   cetirizine (ZYRTEC) 10 MG tablet Take 10 mg by mouth daily.     flunisolide (NASALIDE) 25 MCG/ACT (0.025%) SOLN Place 1-2 sprays into the nose 2 (two) times daily as needed (nasal congestion). 25 mL 3   ipratropium (ATROVENT) 0.03 % nasal spray Place 1-2 sprays into both nostrils 2 (two) times daily as needed (nasal drainage). 30 mL 3   methotrexate (RHEUMATREX) 2.5 MG tablet Take 10 mg by mouth once a week.      montelukast  (SINGULAIR ) 10 MG tablet Take 1 tablet (10 mg total) by mouth daily as needed (for allergies). 90 tablet 0   sildenafil (VIAGRA) 100 MG tablet Take 100 mg by mouth daily as needed for erectile dysfunction.     simvastatin  (ZOCOR ) 40 MG tablet Take 1 tablet by mouth.     methotrexate (RHEUMATREX) 7.5 MG tablet Take 7.5 mg by mouth once a week. Caution Chemotherapy. Protect from light.     No current facility-administered medications for this visit.   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   Occupation: Long Term Care Facilities    Comment: Owns several  Tobacco Use   Smoking status: Former    Current packs/day: 0.00    Average packs/day: 2.0 packs/day for 9.0 years (18.0 ttl pk-yrs)    Types: Cigarettes    Start date: 54    Quit date: 1978    Years since quitting: 47.7   Smokeless tobacco: Never   Tobacco comments:    QUIT   Vaping Use   Vaping status: Never Used  Substance and Sexual Activity   Alcohol use: Never    Comment: rarely   Drug use: Never   Sexual activity: Not on file  Other Topics Concern   Not on file  Social History Narrative   Previously in [place], [place] - now in [place]   Brother is Dr. Oneil Rover (retired) from [place], [place]   Social Drivers of Health   Financial Resource Strain: Low Risk  (12/24/2021)   Received from [facility]   Overall Financial Resource Strain (CARDIA)  Food Insecurity: Low Risk  (05/17/2024)   Received from [facility]   Hunger Vital Sign    Within the past 12 months, you worried that your food would run out before you got money to buy more: Never true    Within the past 12 months, the food you bought just didn't last and you  didn't have money to get more. : Never true  Transportation Needs: No Transportation Needs (05/17/2024)   Received from [facility]    In the past 12 months, has lack of reliable transportation kept you from medical appointments, meetings, work or from getting things needed for daily living? : No  Physical Activity: Sufficiently Active (07/10/2023)   Received from [facility]   Exercise Vital Sign    Days of Exercise per Week: 4 days    Minutes of Exercise per Session: 50 min  Stress: No Stress Concern Present (04/02/2023)   [employer] of Occupational Health - Occupational Stress Questionnaire    Feeling of Stress : Only a little  Social Connections: Moderately Isolated (04/02/2023)   Social Connection and Isolation Panel    Frequency of Communication with Friends and Family: Twice a week    Frequency of Social Gatherings with Friends and Family: Twice a week    Attends Religious Services: Never    Database administrator or Organizations: No    Attends Banker Meetings: Never    Marital Status: Married   Lives in a house. Smoking: quit in 1980 Occupation: business owner  Environmental History: Water Damage/mildew in the house: no Engineer, civil (consulting) in the family room: yes Carpet in the bedroom: yes Heating: electric and gas Cooling: central Pet: yes 2 dogs x many years  Family History: Family History  Problem Relation Age of Onset   Dementia Mother    Congestive Heart Failure Father    CAD Father        quad bypass at age 44   CAD Brother 69   Problem                               Relation Asthma  no Eczema                                no Food allergy                          no Allergic rhino conjunctivitis     brother  Review of Systems  Constitutional:  Positive for chills and fatigue. Negative for appetite change, fever and unexpected weight change.  HENT:  Negative for congestion and rhinorrhea.   Eyes:   Negative for itching.       Watery eyes  Respiratory:  Negative for cough, chest tightness, shortness of breath and wheezing.   Cardiovascular:  Negative for chest pain.  Gastrointestinal:  Negative for abdominal pain.  Genitourinary:  Negative for difficulty urinating.  Musculoskeletal:  Positive for back pain.  Skin:  Negative for rash.  Neurological:  Negative for headaches.    Objective: BP 118/68 (BP Location: Left Arm, Patient Position: Sitting, Cuff Size: Normal)   Pulse 86   Temp 98.6 F (37 C) (Temporal)   Resp 16   Ht 5' 9.5 (1.765 m)   Wt 170 lb 12.8 oz (77.5 kg)   SpO2 95%   BMI 24.86 kg/m  Body mass index is 24.86 kg/m. Physical Exam Vitals and nursing note reviewed.  Constitutional:      Appearance: Normal appearance. He is well-developed.  HENT:     Head: Normocephalic and atraumatic.     Right Ear: Tympanic membrane and external ear normal.     Left Ear: Tympanic membrane and external ear normal.     Nose: Nose normal.     Mouth/Throat:     Mouth: Mucous membranes are moist.     Pharynx: Oropharynx is clear.  Eyes:     Conjunctiva/sclera: Conjunctivae normal.  Cardiovascular:     Rate and Rhythm: Normal rate and regular rhythm.     Heart sounds: Murmur heard.     No friction rub. No gallop.  Pulmonary:     Effort: Pulmonary effort is normal.     Breath sounds: Normal breath sounds. No wheezing, rhonchi or rales.  Musculoskeletal:     Cervical back: Neck supple.  Skin:    General: Skin is warm.     Findings: No rash.  Neurological:     Mental Status: He is alert and oriented to person, place, and time.  Psychiatric:        Behavior: Behavior normal.    The plan was reviewed with the patient/family, and all questions/concerned were addressed.  It was my pleasure to see Leavy today and participate in his care. Please feel free to contact me with any questions or concerns.  Sincerely,  Orlan Cramp, DO Allergy & Immunology  [facility]   [place] office: [phone] [facility] office: [phone]

## 2024-06-10 NOTE — Patient Instructions (Addendum)
 I'm not sure if your symptoms are from environmental allergies. I don't have testing for chemicals, perfumes and strong scents.   Use Atrovent (ipratropium) 0.03% 1-2 sprays per nostril twice a day as needed for runny nose/drainage. Use Flunisolide nasal spray 1-2 sprays per nostril twice a day as needed for nasal congestion.  Nasal saline spray (i.e., Simply Saline) or nasal saline lavage (i.e., NeilMed) is recommended as needed and prior to medicated nasal sprays.  Okay to continue with Singulair  and zyrtec as before. Wean off Mucinex . Recommend seeing an ophthalmologist again for the watery eyes.   Get bloodwork We are ordering labs, so please allow 1-2 weeks for the results to come back. With the newly implemented Cures Act, the labs might be visible to you at the same time that they become visible to me. However, I will not address the results until all of the results are back, so please be patient.  In the meantime, continue recommendations in your patient instructions, including avoidance measures (if applicable), until you hear from me.  Read about Respiray.   [email or web address]  Follow up in 4 weeks or sooner if needed.

## 2024-06-11 DIAGNOSIS — M7918 Myalgia, other site: Secondary | ICD-10-CM | POA: Diagnosis not present

## 2024-07-07 DIAGNOSIS — M25561 Pain in right knee: Secondary | ICD-10-CM | POA: Diagnosis not present

## 2024-07-08 DIAGNOSIS — H8103 Meniere's disease, bilateral: Secondary | ICD-10-CM | POA: Diagnosis not present

## 2024-07-08 DIAGNOSIS — H903 Sensorineural hearing loss, bilateral: Secondary | ICD-10-CM | POA: Diagnosis not present

## 2024-07-08 DIAGNOSIS — H9313 Tinnitus, bilateral: Secondary | ICD-10-CM | POA: Diagnosis not present

## 2024-07-08 DIAGNOSIS — Z9622 Myringotomy tube(s) status: Secondary | ICD-10-CM | POA: Diagnosis not present

## 2024-07-08 DIAGNOSIS — Z4589 Encounter for adjustment and management of other implanted devices: Secondary | ICD-10-CM | POA: Diagnosis not present

## 2024-07-08 DIAGNOSIS — J3089 Other allergic rhinitis: Secondary | ICD-10-CM | POA: Diagnosis not present

## 2024-07-08 DIAGNOSIS — H6992 Unspecified Eustachian tube disorder, left ear: Secondary | ICD-10-CM | POA: Diagnosis not present

## 2024-07-08 DIAGNOSIS — Z79899 Other long term (current) drug therapy: Secondary | ICD-10-CM | POA: Diagnosis not present

## 2024-07-08 DIAGNOSIS — Z981 Arthrodesis status: Secondary | ICD-10-CM | POA: Diagnosis not present

## 2024-07-08 DIAGNOSIS — Z0110 Encounter for examination of ears and hearing without abnormal findings: Secondary | ICD-10-CM | POA: Diagnosis not present

## 2024-07-08 DIAGNOSIS — H90A32 Mixed conductive and sensorineural hearing loss, unilateral, left ear with restricted hearing on the contralateral side: Secondary | ICD-10-CM | POA: Diagnosis not present

## 2024-07-08 DIAGNOSIS — H6982 Other specified disorders of Eustachian tube, left ear: Secondary | ICD-10-CM | POA: Diagnosis not present

## 2024-07-15 ENCOUNTER — Ambulatory Visit (HOSPITAL_COMMUNITY)
Admission: RE | Admit: 2024-07-15 | Discharge: 2024-07-15 | Disposition: A | Discharge status: Home / Self Care | Source: Ambulatory Visit | Attending: Cardiovascular Disease | Admitting: Cardiovascular Disease

## 2024-07-15 DIAGNOSIS — I350 Nonrheumatic aortic (valve) stenosis: Secondary | ICD-10-CM

## 2024-07-15 LAB — ECHOCARDIOGRAM LIMITED
AR max vel: 0.68 cm2
AV Area VTI: 0.63 cm2
AV Area mean vel: 0.64 cm2
AV Mean grad: 33.3 mmHg
AV Peak grad: 56 mmHg
Ao pk vel: 3.74 m/s
Area-P 1/2: 2.82 cm2
P 1/2 time: 363 ms
S' Lateral: 2.8 cm

## 2024-07-18 ENCOUNTER — Ambulatory Visit: Payer: Self-pay | Admitting: Cardiovascular Disease

## 2024-07-19 DIAGNOSIS — S82024D Nondisplaced longitudinal fracture of right patella, subsequent encounter for closed fracture with routine healing: Secondary | ICD-10-CM | POA: Diagnosis not present

## 2024-07-20 ENCOUNTER — Encounter: Payer: Self-pay | Admitting: Cardiovascular Disease

## 2024-07-20 ENCOUNTER — Ambulatory Visit: Attending: Cardiovascular Disease | Admitting: Cardiovascular Disease

## 2024-07-20 VITALS — BP 155/75 | HR 80 | Ht 68.0 in | Wt 171.4 lb

## 2024-07-20 DIAGNOSIS — Z01812 Encounter for preprocedural laboratory examination: Secondary | ICD-10-CM | POA: Insufficient documentation

## 2024-07-20 DIAGNOSIS — I350 Nonrheumatic aortic (valve) stenosis: Secondary | ICD-10-CM | POA: Insufficient documentation

## 2024-07-20 DIAGNOSIS — E782 Mixed hyperlipidemia: Secondary | ICD-10-CM | POA: Diagnosis not present

## 2024-07-20 DIAGNOSIS — I480 Paroxysmal atrial fibrillation: Secondary | ICD-10-CM | POA: Insufficient documentation

## 2024-07-20 NOTE — Patient Instructions (Signed)
 Medication Instructions:  No medication changes were made at this visit. Continue current regimen.   *If you need a refill on your cardiac medications before your next appointment, please call your pharmacy*  Lab Work: To be completed today: BMP and CBC  If you have labs (blood work) drawn today and your tests are completely normal, you will receive your results only by: MyChart Message (if you have MyChart) OR A paper copy in the mail If you have any lab test that is abnormal or we need to change your treatment, we will call you to review the results.  Testing/Procedures: Your physician has requested that you have a cardiac catheterization. Cardiac catheterization is used to diagnose and/or treat various heart conditions. Doctors may recommend this procedure for a number of different reasons. The most common reason is to evaluate chest pain. Chest pain can be a symptom of coronary artery disease (CAD), and cardiac catheterization can show whether plaque is narrowing or blocking your heart's arteries. This procedure is also used to evaluate the valves, as well as measure the blood flow and oxygen levels in different parts of your heart. For further information please visit [email or web address] Please follow instruction sheet, as given.  Our office will be in contact to schedule your CT scans and surgical consult.   Follow-Up: At [facility], you and your health needs are our priority.  As part of our continuing mission to provide you with exceptional heart care, our providers are all part of one team.  This team includes your primary Cardiologist (physician) and Advanced Practice Providers or APPs (Physician Assistants and Nurse Practitioners) who all work together to provide you with the care you need, when you need it.  Your next appointment:   Per structural heart team  Provider:   Ozell Fell, MD or Izetta Hummer, PA-C    We recommend signing up for the patient portal  called MyChart.  Sign up information is provided on this After Visit Summary.  MyChart is used to connect with patients for Virtual Visits (Telemedicine).  Patients are able to view lab/test results, encounter notes, upcoming appointments, etc.  Non-urgent messages can be sent to your provider as well.   To learn more about what you can do with MyChart, go to [email or web address].   Other Instructions       Cardiac/Peripheral Catheterization   You are scheduled for a Cardiac Catheterization on Wednesday, November 26 with Dr. Ozell Fell.  1. Please arrive at the [facility] (Main Entrance A) at [facility]: [address] [place], [place] [address] at 9:30 AM (This time is 2 hour(s) before your procedure to ensure your preparation). Your procedure is scheduled to begin at 11:30 AM.  Free valet parking service is available. You will check in at ADMITTING. The support person will be asked to wait in the waiting room.  It is OK to have someone drop you off and come back when you are ready to be discharged.        Special note: Every effort is made to have your procedure done on time. Please understand that emergencies sometimes delay scheduled procedures.  2. Diet: Nothing to eat after midnight.  3. Hydration:You need to be well hydrated before your procedure. On November 26, you may drink approved liquids (see below) until 2 hours before the procedure, with 16 oz of water as your last intake.   List of approved liquids water, clear juice, clear tea, black coffee, fruit juices, non-citric and  without pulp, carbonated beverages, Gatorade, Kool -Aid, plain Jello-O and plain ice popsicles.  4. Labs: You will need to have blood drawn on Monday, November 24 at [facility] (1st Floor), [address], [place], [place] [address]. You do not need to be fasting.  5. Medication instructions in preparation for your procedure:   Contrast  Allergy: No   Stop taking, Sildenafil (Viagra) Sunday, November 23,   On the morning of your procedure, take Aspirin 81 mg and any morning medicines NOT listed above.  You may use sips of water.  6. Plan to go home the same day, you will only stay overnight if medically necessary. 7. You MUST have a responsible adult to drive you home. 8. An adult MUST be with you the first 24 hours after you arrive home. 9. Bring a current list of your medications, and the last time and date medication taken. 10. Bring ID and current insurance cards. 11.Please wear clothes that are easy to get on and off and wear slip-on shoes.  Thank you for allowing us  to care for you!   -- [facility]

## 2024-07-20 NOTE — Progress Notes (Signed)
 Cardiology Office Note:    Date:  07/20/2024   ID:  Cody Mcconnell, DOB 04-06-1946, MRN [id number]  PCP:  Corlis Pagan, NP   [facility] Providers Cardiologist:  Ozell Fell, MD     Referring MD: Corlis Pagan, NP   Chief Complaint  Patient presents with   Chest Pain    History of Present Illness:    Cody Mcconnell is a 78 y.o. male presenting for follow-up of aortic stenosis.  The patient has been followed for paroxysmal atrial fibrillation and moderate aortic stenosis.  Comorbid conditions include coronary artery calcification, hypertension, and mixed hyperlipidemia.  I saw him most recently in April 2025 and we reviewed his echocardiogram at that time that showed a mean gradient of 31 mmHg, peak transaortic velocity of 3.5 m/s, and calculated aortic valve area ranging 0.8 to 0.9 cm.  The patient preferred continued surveillance at that time.  He recently had a follow-up echocardiogram performed.  This demonstrated vigorous LV function LVEF 65 to 70%, moderate concentric LVH with grade 2 diastolic dysfunction, normal RV function, and thickened, calcified aortic valve leaflets with peak and mean gradients of 59 and 35 mmHg, respectively, calculated aortic valve area 0.62 cm, and dimensionless index 0.22, all consistent with progressive and now severe aortic stenosis.  The patient is here with his wife today.  He has now had 2 different episodes of exertional chest discomfort and shortness of breath.  The first occurred when he was up at the [facility] and he had to do a lot of walking.  The second episode occurred when he was in [place], also walking more than normal.  He otherwise has not had symptoms with his regular activities.  He still swims and denies exertional chest discomfort or shortness of breath with swimming at his normal pace.  He denies orthopnea, PND, lightheadedness, heart palpitation, or syncope.  Current Medications: Current Meds  Medication Sig    cetirizine (ZYRTEC) 10 MG tablet Take 10 mg by mouth daily.   flunisolide (NASALIDE) 25 MCG/ACT (0.025%) SOLN Place 1-2 sprays into the nose 2 (two) times daily as needed (nasal congestion).   ipratropium (ATROVENT) 0.03 % nasal spray Place 1-2 sprays into both nostrils 2 (two) times daily as needed (nasal drainage).   methotrexate (RHEUMATREX) 2.5 MG tablet Take 10 mg by mouth once a week.   methotrexate (RHEUMATREX) 7.5 MG tablet Take 7.5 mg by mouth once a week. Caution Chemotherapy. Protect from light.   montelukast  (SINGULAIR ) 10 MG tablet Take 1 tablet (10 mg total) by mouth daily as needed (for allergies).   sildenafil (VIAGRA) 100 MG tablet Take 100 mg by mouth daily as needed for erectile dysfunction.   simvastatin  (ZOCOR ) 40 MG tablet Take 1 tablet by mouth.     Allergies:   Patient has no known allergies.   ROS:   Please see the history of present illness.    All other systems reviewed and are negative.  EKGs/Labs/Other Studies Reviewed:    The following studies were reviewed today: Cardiac Studies & Procedures   ______________________________________________________________________________________________   STRESS TESTS  MYOCARDIAL PERFUSION IMAGING 05/08/2023  Interpretation Summary   Findings are consistent with small area of anteroapical infarction. The study is low risk.   No ST deviation was noted.   LV perfusion is abnormal. There is no evidence of ischemia. There is evidence of infarction. Defect 1: There is a small defect with moderate reduction in uptake present in the apical to mid anteroseptal location(s)  that is fixed. There is normal wall motion in the defect area. Consistent with infarction.   Left ventricular function is normal. EF 57% End diastolic cavity size is normal. End systolic cavity size is normal.   Prior study not available for comparison.   ECHOCARDIOGRAM  ECHOCARDIOGRAM LIMITED 07/15/2024  Narrative ECHOCARDIOGRAM LIMITED  REPORT    Patient Name:   Cody Mcconnell Date of Exam: 07/15/2024 Medical Rec #:  [id number]          Height:       69.5 in Accession #:    [id number]         Weight:       170.8 lb Date of Birth:  1946-07-23          BSA:          1.942 m Patient Age:    78 years           BP:           108/58 mmHg Patient Gender: M                  HR:           60 bpm. Exam Location:  [facility]  Procedure: Cardiac Doppler, Limited Echo and Limited Color Doppler (Both Spectral and Color Flow Doppler were utilized during procedure).  Indications:    I35.0 AS  History:        Patient has prior history of Echocardiogram examinations. AS, Arrythmias:Atrial Fibrillation; Risk Factors:Dyslipidemia.  Sonographer:    Elsie Bohr RDCS Referring Phys: [phone] Gerrad Welker  IMPRESSIONS   1. Left ventricular ejection fraction, by estimation, is 65 to 70%. The left ventricle has normal function. The left ventricle has no regional wall motion abnormalities. There is moderate concentric left ventricular hypertrophy. Left ventricular diastolic parameters are consistent with Grade II diastolic dysfunction (pseudonormalization). 2. Right ventricular systolic function is normal. The right ventricular size is normal. 3. The mitral valve is normal in structure. Mild mitral valve regurgitation. 4. AV is thickened, calcified with restricted motion. Peak and mean gradients through the valve are 59 and 35 mm Hg respectively AVA (VTI) is 0.62 cm2 Dimensionless valve index is 0.22 Overall consistent with severe AS. Note Stroke volume index low (31) Compared to echo from 2024, mean gradient is increased (28 to 35 mm Hg). Aortic valve regurgitation is mild.  FINDINGS Left Ventricle: Left ventricular ejection fraction, by estimation, is 65 to 70%. The left ventricle has normal function. The left ventricle has no regional wall motion abnormalities. The left ventricular internal cavity size was normal in size. There  is moderate concentric left ventricular hypertrophy. Left ventricular diastolic parameters are consistent with Grade II diastolic dysfunction (pseudonormalization).  Right Ventricle: The right ventricular size is normal. Right vetricular wall thickness was not assessed. Right ventricular systolic function is normal.  Left Atrium: Left atrial size was normal in size.  Right Atrium: Right atrial size was normal in size.  Pericardium: There is no evidence of pericardial effusion.  Mitral Valve: The mitral valve is normal in structure. Mild mitral valve regurgitation.  Tricuspid Valve: The tricuspid valve is normal in structure. Tricuspid valve regurgitation is trivial.  Aortic Valve: AV is thickened, calcified with restricted motion. Peak and mean gradients through the valve are 59 and 35 mm Hg respectively AVA (VTI) is 0.62 cm2 Dimensionless valve index is 0.22 Overall consistent with severe AS. Note Stroke volume index low (31) Compared to echo from 2024, mean gradient is  increased (28 to 35 mm Hg). Aortic valve regurgitation is mild. Aortic regurgitation PHT measures 363 msec. Aortic valve mean gradient measures 33.3 mmHg. Aortic valve peak gradient measures 56.0 mmHg. Aortic valve area, by VTI measures 0.63 cm.  Pulmonic Valve: The pulmonic valve was normal in structure. Pulmonic valve regurgitation is not visualized.  Aorta: The aortic root and ascending aorta are structurally normal, with no evidence of dilitation.  IAS/Shunts: No atrial level shunt detected by color flow Doppler.  LEFT VENTRICLE PLAX 2D LVIDd:         4.10 cm   Diastology LVIDs:         2.80 cm   LV e' medial:    6.85 cm/s LV PW:         1.60 cm   LV E/e' medial:  13.9 LV IVS:        1.30 cm   LV e' lateral:   7.18 cm/s LVOT diam:     1.90 cm   LV E/e' lateral: 13.3 LV SV:         60 LV SV Index:   31 LVOT Area:     2.84 cm   LEFT ATRIUM           Index        RIGHT ATRIUM LA diam:      4.10 cm 2.11 cm/m    RA Pressure: 3.00 mmHg LA Vol (A4C): 49.4 ml 25.44 ml/m AORTIC VALVE AV Area (Vmax):    0.68 cm AV Area (Vmean):   0.64 cm AV Area (VTI):     0.63 cm AV Vmax:           374.00 cm/s AV Vmean:          270.667 cm/s AV VTI:            0.949 m AV Peak Grad:      56.0 mmHg AV Mean Grad:      33.3 mmHg LVOT Vmax:         90.20 cm/s LVOT Vmean:        61.000 cm/s LVOT VTI:          0.212 m LVOT/AV VTI ratio: 0.22 AI PHT:            363 msec  AORTA Ao Root diam: 3.10 cm Ao Asc diam:  3.30 cm  MITRAL VALVE               TRICUSPID VALVE MV Area (PHT): 2.82 cm    Estimated RAP:  3.00 mmHg MV Decel Time: 269 msec MV E velocity: 95.40 cm/s  SHUNTS MV A velocity: 80.20 cm/s  Systemic VTI:  0.21 m MV E/A ratio:  1.19        Systemic Diam: 1.90 cm  Vina Gull MD Electronically signed by Vina Gull MD Signature Date/Time: 07/15/2024/7:42:12 PM    Final    MONITORS  CARDIAC EVENT MONITOR 05/26/2020       ______________________________________________________________________________________________      EKG:        Recent Labs: No results found for requested labs within last 365 days.  Recent Lipid Panel No results found for: CHOL, TRIG, HDL, CHOLHDL, VLDL, LDLCALC, LDLDIRECT      Physical Exam:    VS:  BP (!) 155/75 (BP Location: Right Arm, Patient Position: Sitting, Cuff Size: Normal)   Pulse 80   Ht 5' 8 (1.727 m)   Wt 171 lb 6.4 oz (77.7 kg)   SpO2 96%  BMI 26.06 kg/m     Wt Readings from Last 3 Encounters:  07/20/24 171 lb 6.4 oz (77.7 kg)  06/10/24 170 lb 12.8 oz (77.5 kg)  01/01/24 168 lb 12.8 oz (76.6 kg)     GEN:  Well nourished, well developed in no acute distress HEENT: Normal NECK: No JVD; No carotid bruits LYMPHATICS: No lymphadenopathy CARDIAC: RRR, harsh 3/6 crescendo decrescendo murmur at the right upper sternal border RESPIRATORY:  Clear to auscultation without rales, wheezing or rhonchi  ABDOMEN: Soft, non-tender,  non-distended MUSCULOSKELETAL:  No edema; No deformity  SKIN: Warm and dry NEUROLOGIC:  Alert and oriented x 3 PSYCHIATRIC:  Normal affect   Assessment & Plan Nonrheumatic aortic valve stenosis The patient has developed severe, stage D1, symptomatic aortic stenosis with NYHA functional class II symptoms of exertional angina and dyspnea.  His recent echo shows progressive aortic stenosis now with a calculated aortic valve area of 0.62 cm, dimensionless index 0.22, and low stroke-volume index of 31 and all consistent with severe aortic stenosis. I have reviewed the natural history of aortic stenosis with the patient today. We have discussed the limitations of medical therapy and the poor prognosis associated with symptomatic aortic stenosis. We have reviewed potential treatment options, including palliative medical therapy, conventional surgical aortic valve replacement, and transcatheter aortic valve replacement. We discussed treatment options in the context of the patient's specific comorbid medical conditions.  He will require preoperative assessment with cardiac catheterization. I have reviewed the risks, indications, and alternatives to cardiac catheterization, possible angioplasty, and stenting with the patient. Risks include but are not limited to bleeding, infection, vascular injury, stroke, myocardial infection, arrhythmia, kidney injury, radiation-related injury in the case of prolonged fluoroscopy use, emergency cardiac surgery, and death. The patient understands the risks of serious complication is 1-2 in 1000 with diagnostic cardiac cath and 1-2% or less with angioplasty/stenting.  He will then undergo CT angiography studies of the heart as well as the chest, abdomen, and pelvis for further assessment.  Once his studies are completed, he will be referred for formal cardiac surgical consultation as part of a multidisciplinary approach to his care. Paroxysmal atrial fibrillation (HCC) Maintaining  sinus rhythm, patient with presumed very low atrial fibrillation burden.  He has declined anticoagulation in the past. Mixed hyperlipidemia Treated with simvastatin .  Last LDL cholesterol was 85.  Will assess for coronary disease with upcoming catheterization. Pre-procedure lab exam Precardiac catheterization labs to be checked.       Informed Consent   Shared Decision Making/Informed Consent The risks [stroke (1 in 1000), death (1 in 1000), kidney failure [usually temporary] (1 in 500), bleeding (1 in 200), allergic reaction [possibly serious] (1 in 200)], benefits (diagnostic support and management of coronary artery disease) and alternatives of a cardiac catheterization were discussed in detail with Cody Mcconnell and he is willing to proceed.       Medication Adjustments/Labs and Tests Ordered: Current medicines are reviewed at length with the patient today.  Concerns regarding medicines are outlined above.  Orders Placed This Encounter  Procedures   Basic metabolic panel with GFR   CBC   No orders of the defined types were placed in this encounter.   Patient Instructions  Medication Instructions:  No medication changes were made at this visit. Continue current regimen.   *If you need a refill on your cardiac medications before your next appointment, please call your pharmacy*  Lab Work: To be completed today: BMP and CBC  If you have  labs (blood work) drawn today and your tests are completely normal, you will receive your results only by: MyChart Message (if you have MyChart) OR A paper copy in the mail If you have any lab test that is abnormal or we need to change your treatment, we will call you to review the results.  Testing/Procedures: Your physician has requested that you have a cardiac catheterization. Cardiac catheterization is used to diagnose and/or treat various heart conditions. Doctors may recommend this procedure for a number of different reasons. The most  common reason is to evaluate chest pain. Chest pain can be a symptom of coronary artery disease (CAD), and cardiac catheterization can show whether plaque is narrowing or blocking your heart's arteries. This procedure is also used to evaluate the valves, as well as measure the blood flow and oxygen levels in different parts of your heart. For further information please visit [email or web address] Please follow instruction sheet, as given.  Our office will be in contact to schedule your CT scans and surgical consult.   Follow-Up: At [facility], you and your health needs are our priority.  As part of our continuing mission to provide you with exceptional heart care, our providers are all part of one team.  This team includes your primary Cardiologist (physician) and Advanced Practice Providers or APPs (Physician Assistants and Nurse Practitioners) who all work together to provide you with the care you need, when you need it.  Your next appointment:   Per structural heart team  Provider:   Ozell Fell, MD or Izetta Hummer, PA-C    We recommend signing up for the patient portal called MyChart.  Sign up information is provided on this After Visit Summary.  MyChart is used to connect with patients for Virtual Visits (Telemedicine).  Patients are able to view lab/test results, encounter notes, upcoming appointments, etc.  Non-urgent messages can be sent to your provider as well.   To learn more about what you can do with MyChart, go to [email or web address].   Other Instructions       Cardiac/Peripheral Catheterization   You are scheduled for a Cardiac Catheterization on Wednesday, November 26 with Dr. Ozell Fell.  1. Please arrive at the [facility] (Main Entrance A) at [facility]: [address] [place], [place] [address] at 9:30 AM (This time is 2 hour(s) before your procedure to ensure your preparation). Your procedure is scheduled to begin at 11:30 AM.  Free  valet parking service is available. You will check in at ADMITTING. The support person will be asked to wait in the waiting room.  It is OK to have someone drop you off and come back when you are ready to be discharged.        Special note: Every effort is made to have your procedure done on time. Please understand that emergencies sometimes delay scheduled procedures.  2. Diet: Nothing to eat after midnight.  3. Hydration:You need to be well hydrated before your procedure. On November 26, you may drink approved liquids (see below) until 2 hours before the procedure, with 16 oz of water as your last intake.   List of approved liquids water, clear juice, clear tea, black coffee, fruit juices, non-citric and without pulp, carbonated beverages, Gatorade, Kool -Aid, plain Jello-O and plain ice popsicles.  4. Labs: You will need to have blood drawn on Monday, November 24 at [facility] (1st Floor), [address] [place], [place]  [address]. You do not need to be fasting.  5. Medication instructions in preparation for your procedure:   Contrast Allergy: No   Stop taking, Sildenafil (Viagra) Sunday, November 23,   On the morning of your procedure, take Aspirin 81 mg and any morning medicines NOT listed above.  You may use sips of water.  6. Plan to go home the same day, you will only stay overnight if medically necessary. 7. You MUST have a responsible adult to drive you home. 8. An adult MUST be with you the first 24 hours after you arrive home. 9. Bring a current list of your medications, and the last time and date medication taken. 10. Bring ID and current insurance cards. 11.Please wear clothes that are easy to get on and off and wear slip-on shoes.  Thank you for allowing us  to care for you!   -- [facility]     Signed, Ozell Fell, MD  07/20/2024 6:10 PM    [facility]

## 2024-07-20 NOTE — Assessment & Plan Note (Addendum)
 The patient has developed severe, stage D1, symptomatic aortic stenosis with NYHA functional class II symptoms of exertional angina and dyspnea.  His recent echo shows progressive aortic stenosis now with a calculated aortic valve area of 0.62 cm, dimensionless index 0.22, and low stroke-volume index of 31 and all consistent with severe aortic stenosis. I have reviewed the natural history of aortic stenosis with the patient today. We have discussed the limitations of medical therapy and the poor prognosis associated with symptomatic aortic stenosis. We have reviewed potential treatment options, including palliative medical therapy, conventional surgical aortic valve replacement, and transcatheter aortic valve replacement. We discussed treatment options in the context of the patient's specific comorbid medical conditions.  He will require preoperative assessment with cardiac catheterization. I have reviewed the risks, indications, and alternatives to cardiac catheterization, possible angioplasty, and stenting with the patient. Risks include but are not limited to bleeding, infection, vascular injury, stroke, myocardial infection, arrhythmia, kidney injury, radiation-related injury in the case of prolonged fluoroscopy use, emergency cardiac surgery, and death. The patient understands the risks of serious complication is 1-2 in 1000 with diagnostic cardiac cath and 1-2% or less with angioplasty/stenting.  He will then undergo CT angiography studies of the heart as well as the chest, abdomen, and pelvis for further assessment.  Once his studies are completed, he will be referred for formal cardiac surgical consultation as part of a multidisciplinary approach to his care.

## 2024-07-20 NOTE — Progress Notes (Signed)
 Pre Surgical Assessment: 5 M Walk Test  83M=16.4ft  5 Meter Walk Test- trial 1: 5.41 seconds 5 Meter Walk Test- trial 2: 4.88 seconds 5 Meter Walk Test- trial 3: 5.06 seconds 5 Meter Walk Test Average: 5.11 seconds

## 2024-07-20 NOTE — Assessment & Plan Note (Signed)
 Maintaining sinus rhythm, patient with presumed very low atrial fibrillation burden.  He has declined anticoagulation in the past.

## 2024-07-20 NOTE — H&P (View-Only) (Signed)
 Cardiology Office Note:    Date:  07/20/2024   ID:  Cody Mcconnell, DOB 04-06-1946, MRN [id number]  PCP:  Corlis Pagan, NP   [facility] Providers Cardiologist:  Ozell Fell, MD     Referring MD: Corlis Pagan, NP   Chief Complaint  Patient presents with   Chest Pain    History of Present Illness:    Cody Mcconnell is a 78 y.o. male presenting for follow-up of aortic stenosis.  The patient has been followed for paroxysmal atrial fibrillation and moderate aortic stenosis.  Comorbid conditions include coronary artery calcification, hypertension, and mixed hyperlipidemia.  I saw him most recently in April 2025 and we reviewed his echocardiogram at that time that showed a mean gradient of 31 mmHg, peak transaortic velocity of 3.5 m/s, and calculated aortic valve area ranging 0.8 to 0.9 cm.  The patient preferred continued surveillance at that time.  He recently had a follow-up echocardiogram performed.  This demonstrated vigorous LV function LVEF 65 to 70%, moderate concentric LVH with grade 2 diastolic dysfunction, normal RV function, and thickened, calcified aortic valve leaflets with peak and mean gradients of 59 and 35 mmHg, respectively, calculated aortic valve area 0.62 cm, and dimensionless index 0.22, all consistent with progressive and now severe aortic stenosis.  The patient is here with his wife today.  He has now had 2 different episodes of exertional chest discomfort and shortness of breath.  The first occurred when he was up at the [facility] and he had to do a lot of walking.  The second episode occurred when he was in [place], also walking more than normal.  He otherwise has not had symptoms with his regular activities.  He still swims and denies exertional chest discomfort or shortness of breath with swimming at his normal pace.  He denies orthopnea, PND, lightheadedness, heart palpitation, or syncope.  Current Medications: Current Meds  Medication Sig    cetirizine (ZYRTEC) 10 MG tablet Take 10 mg by mouth daily.   flunisolide (NASALIDE) 25 MCG/ACT (0.025%) SOLN Place 1-2 sprays into the nose 2 (two) times daily as needed (nasal congestion).   ipratropium (ATROVENT) 0.03 % nasal spray Place 1-2 sprays into both nostrils 2 (two) times daily as needed (nasal drainage).   methotrexate (RHEUMATREX) 2.5 MG tablet Take 10 mg by mouth once a week.   methotrexate (RHEUMATREX) 7.5 MG tablet Take 7.5 mg by mouth once a week. Caution Chemotherapy. Protect from light.   montelukast  (SINGULAIR ) 10 MG tablet Take 1 tablet (10 mg total) by mouth daily as needed (for allergies).   sildenafil (VIAGRA) 100 MG tablet Take 100 mg by mouth daily as needed for erectile dysfunction.   simvastatin  (ZOCOR ) 40 MG tablet Take 1 tablet by mouth.     Allergies:   Patient has no known allergies.   ROS:   Please see the history of present illness.    All other systems reviewed and are negative.  EKGs/Labs/Other Studies Reviewed:    The following studies were reviewed today: Cardiac Studies & Procedures   ______________________________________________________________________________________________   STRESS TESTS  MYOCARDIAL PERFUSION IMAGING 05/08/2023  Interpretation Summary   Findings are consistent with small area of anteroapical infarction. The study is low risk.   No ST deviation was noted.   LV perfusion is abnormal. There is no evidence of ischemia. There is evidence of infarction. Defect 1: There is a small defect with moderate reduction in uptake present in the apical to mid anteroseptal location(s)  that is fixed. There is normal wall motion in the defect area. Consistent with infarction.   Left ventricular function is normal. EF 57% End diastolic cavity size is normal. End systolic cavity size is normal.   Prior study not available for comparison.   ECHOCARDIOGRAM  ECHOCARDIOGRAM LIMITED 07/15/2024  Narrative ECHOCARDIOGRAM LIMITED  REPORT    Patient Name:   Cody Mcconnell Date of Exam: 07/15/2024 Medical Rec #:  [id number]          Height:       69.5 in Accession #:    [id number]         Weight:       170.8 lb Date of Birth:  1946-07-23          BSA:          1.942 m Patient Age:    78 years           BP:           108/58 mmHg Patient Gender: M                  HR:           60 bpm. Exam Location:  [facility]  Procedure: Cardiac Doppler, Limited Echo and Limited Color Doppler (Both Spectral and Color Flow Doppler were utilized during procedure).  Indications:    I35.0 AS  History:        Patient has prior history of Echocardiogram examinations. AS, Arrythmias:Atrial Fibrillation; Risk Factors:Dyslipidemia.  Sonographer:    Elsie Bohr RDCS Referring Phys: [phone] Gerrad Welker  IMPRESSIONS   1. Left ventricular ejection fraction, by estimation, is 65 to 70%. The left ventricle has normal function. The left ventricle has no regional wall motion abnormalities. There is moderate concentric left ventricular hypertrophy. Left ventricular diastolic parameters are consistent with Grade II diastolic dysfunction (pseudonormalization). 2. Right ventricular systolic function is normal. The right ventricular size is normal. 3. The mitral valve is normal in structure. Mild mitral valve regurgitation. 4. AV is thickened, calcified with restricted motion. Peak and mean gradients through the valve are 59 and 35 mm Hg respectively AVA (VTI) is 0.62 cm2 Dimensionless valve index is 0.22 Overall consistent with severe AS. Note Stroke volume index low (31) Compared to echo from 2024, mean gradient is increased (28 to 35 mm Hg). Aortic valve regurgitation is mild.  FINDINGS Left Ventricle: Left ventricular ejection fraction, by estimation, is 65 to 70%. The left ventricle has normal function. The left ventricle has no regional wall motion abnormalities. The left ventricular internal cavity size was normal in size. There  is moderate concentric left ventricular hypertrophy. Left ventricular diastolic parameters are consistent with Grade II diastolic dysfunction (pseudonormalization).  Right Ventricle: The right ventricular size is normal. Right vetricular wall thickness was not assessed. Right ventricular systolic function is normal.  Left Atrium: Left atrial size was normal in size.  Right Atrium: Right atrial size was normal in size.  Pericardium: There is no evidence of pericardial effusion.  Mitral Valve: The mitral valve is normal in structure. Mild mitral valve regurgitation.  Tricuspid Valve: The tricuspid valve is normal in structure. Tricuspid valve regurgitation is trivial.  Aortic Valve: AV is thickened, calcified with restricted motion. Peak and mean gradients through the valve are 59 and 35 mm Hg respectively AVA (VTI) is 0.62 cm2 Dimensionless valve index is 0.22 Overall consistent with severe AS. Note Stroke volume index low (31) Compared to echo from 2024, mean gradient is  increased (28 to 35 mm Hg). Aortic valve regurgitation is mild. Aortic regurgitation PHT measures 363 msec. Aortic valve mean gradient measures 33.3 mmHg. Aortic valve peak gradient measures 56.0 mmHg. Aortic valve area, by VTI measures 0.63 cm.  Pulmonic Valve: The pulmonic valve was normal in structure. Pulmonic valve regurgitation is not visualized.  Aorta: The aortic root and ascending aorta are structurally normal, with no evidence of dilitation.  IAS/Shunts: No atrial level shunt detected by color flow Doppler.  LEFT VENTRICLE PLAX 2D LVIDd:         4.10 cm   Diastology LVIDs:         2.80 cm   LV e' medial:    6.85 cm/s LV PW:         1.60 cm   LV E/e' medial:  13.9 LV IVS:        1.30 cm   LV e' lateral:   7.18 cm/s LVOT diam:     1.90 cm   LV E/e' lateral: 13.3 LV SV:         60 LV SV Index:   31 LVOT Area:     2.84 cm   LEFT ATRIUM           Index        RIGHT ATRIUM LA diam:      4.10 cm 2.11 cm/m    RA Pressure: 3.00 mmHg LA Vol (A4C): 49.4 ml 25.44 ml/m AORTIC VALVE AV Area (Vmax):    0.68 cm AV Area (Vmean):   0.64 cm AV Area (VTI):     0.63 cm AV Vmax:           374.00 cm/s AV Vmean:          270.667 cm/s AV VTI:            0.949 m AV Peak Grad:      56.0 mmHg AV Mean Grad:      33.3 mmHg LVOT Vmax:         90.20 cm/s LVOT Vmean:        61.000 cm/s LVOT VTI:          0.212 m LVOT/AV VTI ratio: 0.22 AI PHT:            363 msec  AORTA Ao Root diam: 3.10 cm Ao Asc diam:  3.30 cm  MITRAL VALVE               TRICUSPID VALVE MV Area (PHT): 2.82 cm    Estimated RAP:  3.00 mmHg MV Decel Time: 269 msec MV E velocity: 95.40 cm/s  SHUNTS MV A velocity: 80.20 cm/s  Systemic VTI:  0.21 m MV E/A ratio:  1.19        Systemic Diam: 1.90 cm  Vina Gull MD Electronically signed by Vina Gull MD Signature Date/Time: 07/15/2024/7:42:12 PM    Final    MONITORS  CARDIAC EVENT MONITOR 05/26/2020       ______________________________________________________________________________________________      EKG:        Recent Labs: No results found for requested labs within last 365 days.  Recent Lipid Panel No results found for: CHOL, TRIG, HDL, CHOLHDL, VLDL, LDLCALC, LDLDIRECT      Physical Exam:    VS:  BP (!) 155/75 (BP Location: Right Arm, Patient Position: Sitting, Cuff Size: Normal)   Pulse 80   Ht 5' 8 (1.727 m)   Wt 171 lb 6.4 oz (77.7 kg)   SpO2 96%  BMI 26.06 kg/m     Wt Readings from Last 3 Encounters:  07/20/24 171 lb 6.4 oz (77.7 kg)  06/10/24 170 lb 12.8 oz (77.5 kg)  01/01/24 168 lb 12.8 oz (76.6 kg)     GEN:  Well nourished, well developed in no acute distress HEENT: Normal NECK: No JVD; No carotid bruits LYMPHATICS: No lymphadenopathy CARDIAC: RRR, harsh 3/6 crescendo decrescendo murmur at the right upper sternal border RESPIRATORY:  Clear to auscultation without rales, wheezing or rhonchi  ABDOMEN: Soft, non-tender,  non-distended MUSCULOSKELETAL:  No edema; No deformity  SKIN: Warm and dry NEUROLOGIC:  Alert and oriented x 3 PSYCHIATRIC:  Normal affect   Assessment & Plan Nonrheumatic aortic valve stenosis The patient has developed severe, stage D1, symptomatic aortic stenosis with NYHA functional class II symptoms of exertional angina and dyspnea.  His recent echo shows progressive aortic stenosis now with a calculated aortic valve area of 0.62 cm, dimensionless index 0.22, and low stroke-volume index of 31 and all consistent with severe aortic stenosis. I have reviewed the natural history of aortic stenosis with the patient today. We have discussed the limitations of medical therapy and the poor prognosis associated with symptomatic aortic stenosis. We have reviewed potential treatment options, including palliative medical therapy, conventional surgical aortic valve replacement, and transcatheter aortic valve replacement. We discussed treatment options in the context of the patient's specific comorbid medical conditions.  He will require preoperative assessment with cardiac catheterization. I have reviewed the risks, indications, and alternatives to cardiac catheterization, possible angioplasty, and stenting with the patient. Risks include but are not limited to bleeding, infection, vascular injury, stroke, myocardial infection, arrhythmia, kidney injury, radiation-related injury in the case of prolonged fluoroscopy use, emergency cardiac surgery, and death. The patient understands the risks of serious complication is 1-2 in 1000 with diagnostic cardiac cath and 1-2% or less with angioplasty/stenting.  He will then undergo CT angiography studies of the heart as well as the chest, abdomen, and pelvis for further assessment.  Once his studies are completed, he will be referred for formal cardiac surgical consultation as part of a multidisciplinary approach to his care. Paroxysmal atrial fibrillation (HCC) Maintaining  sinus rhythm, patient with presumed very low atrial fibrillation burden.  He has declined anticoagulation in the past. Mixed hyperlipidemia Treated with simvastatin .  Last LDL cholesterol was 85.  Will assess for coronary disease with upcoming catheterization. Pre-procedure lab exam Precardiac catheterization labs to be checked.       Informed Consent   Shared Decision Making/Informed Consent The risks [stroke (1 in 1000), death (1 in 1000), kidney failure [usually temporary] (1 in 500), bleeding (1 in 200), allergic reaction [possibly serious] (1 in 200)], benefits (diagnostic support and management of coronary artery disease) and alternatives of a cardiac catheterization were discussed in detail with Cody Mcconnell and he is willing to proceed.       Medication Adjustments/Labs and Tests Ordered: Current medicines are reviewed at length with the patient today.  Concerns regarding medicines are outlined above.  Orders Placed This Encounter  Procedures   Basic metabolic panel with GFR   CBC   No orders of the defined types were placed in this encounter.   Patient Instructions  Medication Instructions:  No medication changes were made at this visit. Continue current regimen.   *If you need a refill on your cardiac medications before your next appointment, please call your pharmacy*  Lab Work: To be completed today: BMP and CBC  If you have  labs (blood work) drawn today and your tests are completely normal, you will receive your results only by: MyChart Message (if you have MyChart) OR A paper copy in the mail If you have any lab test that is abnormal or we need to change your treatment, we will call you to review the results.  Testing/Procedures: Your physician has requested that you have a cardiac catheterization. Cardiac catheterization is used to diagnose and/or treat various heart conditions. Doctors may recommend this procedure for a number of different reasons. The most  common reason is to evaluate chest pain. Chest pain can be a symptom of coronary artery disease (CAD), and cardiac catheterization can show whether plaque is narrowing or blocking your heart's arteries. This procedure is also used to evaluate the valves, as well as measure the blood flow and oxygen levels in different parts of your heart. For further information please visit [email or web address] Please follow instruction sheet, as given.  Our office will be in contact to schedule your CT scans and surgical consult.   Follow-Up: At [facility], you and your health needs are our priority.  As part of our continuing mission to provide you with exceptional heart care, our providers are all part of one team.  This team includes your primary Cardiologist (physician) and Advanced Practice Providers or APPs (Physician Assistants and Nurse Practitioners) who all work together to provide you with the care you need, when you need it.  Your next appointment:   Per structural heart team  Provider:   Ozell Fell, MD or Izetta Hummer, PA-C    We recommend signing up for the patient portal called MyChart.  Sign up information is provided on this After Visit Summary.  MyChart is used to connect with patients for Virtual Visits (Telemedicine).  Patients are able to view lab/test results, encounter notes, upcoming appointments, etc.  Non-urgent messages can be sent to your provider as well.   To learn more about what you can do with MyChart, go to [email or web address].   Other Instructions       Cardiac/Peripheral Catheterization   You are scheduled for a Cardiac Catheterization on Wednesday, November 26 with Dr. Ozell Fell.  1. Please arrive at the [facility] (Main Entrance A) at [facility]: [address] [place], [place] [address] at 9:30 AM (This time is 2 hour(s) before your procedure to ensure your preparation). Your procedure is scheduled to begin at 11:30 AM.  Free  valet parking service is available. You will check in at ADMITTING. The support person will be asked to wait in the waiting room.  It is OK to have someone drop you off and come back when you are ready to be discharged.        Special note: Every effort is made to have your procedure done on time. Please understand that emergencies sometimes delay scheduled procedures.  2. Diet: Nothing to eat after midnight.  3. Hydration:You need to be well hydrated before your procedure. On November 26, you may drink approved liquids (see below) until 2 hours before the procedure, with 16 oz of water as your last intake.   List of approved liquids water, clear juice, clear tea, black coffee, fruit juices, non-citric and without pulp, carbonated beverages, Gatorade, Kool -Aid, plain Jello-O and plain ice popsicles.  4. Labs: You will need to have blood drawn on Monday, November 24 at [facility] (1st Floor), [address] [place], [place]  [address]. You do not need to be fasting.  5. Medication instructions in preparation for your procedure:   Contrast Allergy: No   Stop taking, Sildenafil (Viagra) Sunday, November 23,   On the morning of your procedure, take Aspirin 81 mg and any morning medicines NOT listed above.  You may use sips of water.  6. Plan to go home the same day, you will only stay overnight if medically necessary. 7. You MUST have a responsible adult to drive you home. 8. An adult MUST be with you the first 24 hours after you arrive home. 9. Bring a current list of your medications, and the last time and date medication taken. 10. Bring ID and current insurance cards. 11.Please wear clothes that are easy to get on and off and wear slip-on shoes.  Thank you for allowing us  to care for you!   -- [facility]     Signed, Ozell Fell, MD  07/20/2024 6:10 PM    [facility]

## 2024-07-20 NOTE — Assessment & Plan Note (Signed)
 Treated with simvastatin .  Last LDL cholesterol was 85.  Will assess for coronary disease with upcoming catheterization.

## 2024-07-21 LAB — CBC
Hematocrit: 44.3 % (ref 37.5–51.0)
Hemoglobin: 14.7 g/dL (ref 13.0–17.7)
MCH: 28.8 pg (ref 26.6–33.0)
MCHC: 33.2 g/dL (ref 31.5–35.7)
MCV: 87 fL (ref 79–97)
Platelets: 256 x10E3/uL (ref 150–450)
RBC: 5.11 x10E6/uL (ref 4.14–5.80)
RDW: 13.3 % (ref 11.6–15.4)
WBC: 6.8 x10E3/uL (ref 3.4–10.8)

## 2024-07-21 LAB — BASIC METABOLIC PANEL WITH GFR
BUN/Creatinine Ratio: 24 (ref 10–24)
BUN: 22 mg/dL (ref 8–27)
CO2: 20 mmol/L (ref 20–29)
Calcium: 9.4 mg/dL (ref 8.6–10.2)
Chloride: 107 mmol/L — ABNORMAL HIGH (ref 96–106)
Creatinine, Ser: 0.92 mg/dL (ref 0.76–1.27)
Glucose: 94 mg/dL (ref 70–99)
Potassium: 4.6 mmol/L (ref 3.5–5.2)
Sodium: 142 mmol/L (ref 134–144)
eGFR: 86 mL/min/1.73 (ref >59)

## 2024-07-27 ENCOUNTER — Ambulatory Visit: Admitting: Cardiovascular Disease

## 2024-08-04 ENCOUNTER — Encounter (HOSPITAL_COMMUNITY)
Admission: RE | Disposition: A | Discharge status: Home / Self Care | Payer: Self-pay | Source: Home / Self Care | Attending: Cardiovascular Disease

## 2024-08-04 ENCOUNTER — Ambulatory Visit (HOSPITAL_COMMUNITY)
Admission: RE | Admit: 2024-08-04 | Discharge: 2024-08-04 | Disposition: A | Discharge status: Home / Self Care | Attending: Cardiovascular Disease | Admitting: Cardiovascular Disease

## 2024-08-04 ENCOUNTER — Other Ambulatory Visit: Payer: Self-pay

## 2024-08-04 ENCOUNTER — Telehealth: Payer: Self-pay | Admitting: Cardiovascular Disease

## 2024-08-04 DIAGNOSIS — E782 Mixed hyperlipidemia: Secondary | ICD-10-CM | POA: Insufficient documentation

## 2024-08-04 DIAGNOSIS — I2584 Coronary atherosclerosis due to calcified coronary lesion: Secondary | ICD-10-CM | POA: Insufficient documentation

## 2024-08-04 DIAGNOSIS — I080 Rheumatic disorders of both mitral and aortic valves: Secondary | ICD-10-CM | POA: Insufficient documentation

## 2024-08-04 DIAGNOSIS — I350 Nonrheumatic aortic (valve) stenosis: Secondary | ICD-10-CM | POA: Diagnosis not present

## 2024-08-04 DIAGNOSIS — I2510 Atherosclerotic heart disease of native coronary artery without angina pectoris: Secondary | ICD-10-CM | POA: Diagnosis not present

## 2024-08-04 DIAGNOSIS — Z79899 Other long term (current) drug therapy: Secondary | ICD-10-CM | POA: Insufficient documentation

## 2024-08-04 DIAGNOSIS — I25118 Atherosclerotic heart disease of native coronary artery with other forms of angina pectoris: Secondary | ICD-10-CM | POA: Diagnosis not present

## 2024-08-04 DIAGNOSIS — I119 Hypertensive heart disease without heart failure: Secondary | ICD-10-CM | POA: Diagnosis not present

## 2024-08-04 DIAGNOSIS — I480 Paroxysmal atrial fibrillation: Secondary | ICD-10-CM | POA: Diagnosis not present

## 2024-08-04 HISTORY — PX: CORONARY ANGIOGRAPHY: CATH118303

## 2024-08-04 HISTORY — PX: CORONARY PRESSURE/FFR WITH 3D MAPPING: CATH118309

## 2024-08-04 LAB — POCT ACTIVATED CLOTTING TIME: Activated Clotting Time: 222 s

## 2024-08-04 SURGERY — CORONARY ANGIOGRAPHY (CATH LAB)
Anesthesia: LOCAL

## 2024-08-04 MED ORDER — HEPARIN SODIUM (PORCINE) 1000 UNIT/ML IJ SOLN
INTRAMUSCULAR | Status: AC
  Filled 2024-08-04: qty 10

## 2024-08-04 MED ORDER — HYDRALAZINE HCL 20 MG/ML IJ SOLN: 10.0000 mg | INTRAMUSCULAR | Status: DC | PRN

## 2024-08-04 MED ORDER — SODIUM CHLORIDE 0.9 % IV SOLN: 250.0000 mL | INTRAVENOUS | Status: DC | PRN

## 2024-08-04 MED ORDER — MIDAZOLAM HCL 2 MG/2ML IJ SOLN
INTRAMUSCULAR | Status: AC
  Filled 2024-08-04: qty 2

## 2024-08-04 MED ORDER — HEPARIN SODIUM (PORCINE) 1000 UNIT/ML IJ SOLN
INTRAMUSCULAR | Status: DC | PRN
  Administered 2024-08-04 (×2): 4000 [IU] via INTRAVENOUS

## 2024-08-04 MED ORDER — FREE WATER: 500.0000 mL | Freq: Once | Status: DC

## 2024-08-04 MED ORDER — LIDOCAINE HCL (PF) 1 % IJ SOLN
INTRAMUSCULAR | Status: AC
  Filled 2024-08-04: qty 30

## 2024-08-04 MED ORDER — HEPARIN (PORCINE) IN NACL 1000-0.9 UT/500ML-% IV SOLN
INTRAVENOUS | Status: DC | PRN
Start: 2024-08-04 — End: 2024-08-04
  Administered 2024-08-04 (×2): 500 mL

## 2024-08-04 MED ORDER — SODIUM CHLORIDE 0.9% FLUSH: 3.0000 mL | Freq: Two times a day (BID) | INTRAVENOUS | Status: DC

## 2024-08-04 MED ORDER — ACETAMINOPHEN 325 MG PO TABS: 650.0000 mg | ORAL_TABLET | ORAL | Status: DC | PRN

## 2024-08-04 MED ORDER — LABETALOL HCL 5 MG/ML IV SOLN: 10.0000 mg | INTRAVENOUS | Status: DC | PRN

## 2024-08-04 MED ORDER — FENTANYL CITRATE (PF) 100 MCG/2ML IJ SOLN
INTRAMUSCULAR | Status: AC
  Filled 2024-08-04: qty 2

## 2024-08-04 MED ORDER — IOHEXOL 350 MG/ML SOLN
INTRAVENOUS | Status: DC | PRN
  Administered 2024-08-04: 45 mL

## 2024-08-04 MED ORDER — FENTANYL CITRATE (PF) 100 MCG/2ML IJ SOLN
INTRAMUSCULAR | Status: DC | PRN
  Administered 2024-08-04: 25 ug via INTRAVENOUS

## 2024-08-04 MED ORDER — HEPARIN (PORCINE) IN NACL 2-0.9 UNITS/ML
INTRAMUSCULAR | Status: DC | PRN
  Administered 2024-08-04: 10 mL via INTRA_ARTERIAL

## 2024-08-04 MED ORDER — SODIUM CHLORIDE 0.9% FLUSH: 3.0000 mL | INTRAVENOUS | Status: DC | PRN

## 2024-08-04 MED ORDER — ASPIRIN 81 MG PO CHEW: 81.0000 mg | CHEWABLE_TABLET | ORAL | Status: DC

## 2024-08-04 MED ORDER — ONDANSETRON HCL 4 MG/2ML IJ SOLN: 4.0000 mg | Freq: Four times a day (QID) | INTRAMUSCULAR | Status: DC | PRN

## 2024-08-04 MED ORDER — VERAPAMIL HCL 2.5 MG/ML IV SOLN
INTRAVENOUS | Status: AC
  Filled 2024-08-04: qty 2

## 2024-08-04 MED ORDER — LIDOCAINE HCL (PF) 1 % IJ SOLN
INTRAMUSCULAR | Status: DC | PRN
  Administered 2024-08-04: 2 mL

## 2024-08-04 MED ORDER — MIDAZOLAM HCL (PF) 2 MG/2ML IJ SOLN
INTRAMUSCULAR | Status: DC | PRN
  Administered 2024-08-04: 2 mg via INTRAVENOUS

## 2024-08-04 SURGICAL SUPPLY — 9 items
CATH 5FR JL3.5 JR4 ANG PIG MP (CATHETERS) IMPLANT
CATH LAUNCHER 5F EBU3.5 (CATHETERS) IMPLANT
DEVICE RAD COMP TR BAND LRG (VASCULAR PRODUCTS) IMPLANT
GLIDESHEATH SLEND SS 6F .021 (SHEATH) IMPLANT
GUIDEWIRE INQWIRE 1.5J.035X260 (WIRE) IMPLANT
GUIDEWIRE PRESSURE X 175 (WIRE) IMPLANT
KIT ESSENTIALS PG (KITS) IMPLANT
PACK CARDIAC CATHETERIZATION (CUSTOM PROCEDURE TRAY) ×2 IMPLANT
SET ATX-X65L (MISCELLANEOUS) IMPLANT

## 2024-08-04 NOTE — Discharge Instructions (Signed)

## 2024-08-04 NOTE — Interval H&P Note (Signed)
 History and Physical Interval Note:  08/04/2024 11:06 AM  Cody Mcconnell  has presented today for surgery, with the diagnosis of aortic stenosis.  The various methods of treatment have been discussed with the patient and family. After consideration of risks, benefits and other options for treatment, the patient has consented to  Procedure(s): LEFT HEART CATH AND CORONARY ANGIOGRAPHY (N/A) as a surgical intervention.  The patient's history has been reviewed, patient examined, no change in status, stable for surgery.  I have reviewed the patient's chart and labs.  Questions were answered to the patient's satisfaction.     Ozell Fell

## 2024-08-04 NOTE — Telephone Encounter (Signed)
 Brother Alonso) wants a call back from Dr. Wonda regarding patient care.

## 2024-08-04 NOTE — Telephone Encounter (Signed)
 Attempted to call pt, unable to reach. LMTCB to confirm it is okay to speak with brother Alonso). Pt most recent DPR doesn't have anyone other than himself listed as individuals to release information to.

## 2024-08-04 NOTE — Telephone Encounter (Signed)
 Returned call to patient's brother Dr.Zerby.Stated she would like to talk to Dr.Cooper about his brother.Stated he would also like appointment with Dr.Cooper.Advised Dr.Copper is out of office today.I will send message to him.

## 2024-08-04 NOTE — Progress Notes (Signed)
 TR band removed at 1410, gauze dressing applied. Right radial level 0, clean, dry, and intact.

## 2024-08-05 ENCOUNTER — Encounter (HOSPITAL_COMMUNITY): Payer: Self-pay | Admitting: Cardiovascular Disease

## 2024-08-08 NOTE — Telephone Encounter (Signed)
 Called patient to get Dr Beuford phone number and to get his permission to speak with him. I left a message on his voicemail and left my contact information.

## 2024-08-09 ENCOUNTER — Encounter: Payer: Self-pay | Admitting: Cardiovascular Disease

## 2024-08-13 NOTE — Telephone Encounter (Signed)
 Spoke to his brother, Oneil, who is a retired administrator, arts. I updated him on [name]'s condition. He has requested to see me for evaluation. Please arrange OP consult with me sometime after Jan 1. Thank you

## 2024-08-18 NOTE — Telephone Encounter (Signed)
 Dr. Wonda, would you like for me to follow up on this or were you able to reach him?

## 2024-08-18 NOTE — Telephone Encounter (Signed)
 Thx for checking back. I had a long conversation with his brother Cody Mcconnell and he's getting set up to see me in January

## 2024-08-19 NOTE — Telephone Encounter (Signed)
 Perfect! Just wanted to make sure.

## 2024-08-30 ENCOUNTER — Encounter: Payer: Self-pay | Admitting: *Deleted

## 2024-08-30 ENCOUNTER — Other Ambulatory Visit: Payer: Self-pay | Admitting: *Deleted

## 2024-08-30 ENCOUNTER — Ambulatory Visit: Attending: Surgery | Admitting: Surgery

## 2024-08-30 ENCOUNTER — Encounter: Payer: Self-pay | Admitting: Surgery

## 2024-08-30 VITALS — BP 150/88 | HR 77 | Resp 18 | Ht 68.0 in | Wt 176.0 lb

## 2024-08-30 DIAGNOSIS — Z5181 Encounter for therapeutic drug level monitoring: Secondary | ICD-10-CM

## 2024-08-30 DIAGNOSIS — I350 Nonrheumatic aortic (valve) stenosis: Secondary | ICD-10-CM | POA: Insufficient documentation

## 2024-08-30 DIAGNOSIS — I2510 Atherosclerotic heart disease of native coronary artery without angina pectoris: Secondary | ICD-10-CM

## 2024-08-30 DIAGNOSIS — R7989 Other specified abnormal findings of blood chemistry: Secondary | ICD-10-CM

## 2024-08-30 NOTE — Progress Notes (Signed)
 "  [address], [place] [address]             [phone]     Cardiothoracic Surgery Consultation   PCP is Corlis Pagan, NP Referring Provider is Wonda Sharper, MD  Chief Complaint  Patient presents with   Coronary Artery Disease   Aortic Stenosis    HPI:  The patient is a 78 year old gentleman with a history of hyperlipidemia, hypertension, paroxysmal atrial fibrillation, and moderate aortic stenosis followed by echocardiogram.  A 2D echocardiogram on 04/02/2023 showed a severely thickened and calcified aortic valve with a mean gradient of 31 mmHg.  Left ventricular ejection fraction was 55 to 60%.  Since then he had 2 different episodes of exertional chest discomfort and shortness of breath associated with activity.  The first occurred when he was in [place] walking up hills.  He developed a severe squeezing sensation in his chest relieved with rest.  He had a second episode when he was at the [facility] attending a pain rehabilitation clinic and had to do a lot of walking.  This was a similar severe squeezing sensation in the chest.  Since then he has had intermittent episodes of this squeezing sensation in his chest as well as exertional shortness of breath and fatigue.  If he is up doing activity in the morning for a few hours then he needs to take a nap for a few hours.  He still swims and has had no exertional chest discomfort or shortness of breath with this.  His denies orthopnea and peripheral edema.  He has had dizziness but no syncope.  He had a follow-up echocardiogram on 07/15/2024 showing an increase in the mean gradient to 35 mmHg with a valve area by VTI of 0.62 cm and dimensionless index of 0.22.  Stroke-volume index was low at 31.  Left ventricular ejection fraction was 65 to 70%.  He subsequently underwent cardiac catheterization on 08/04/2024 showing an ostial LAD stenosis estimated 80%.  There was also proximal to mid 50% LAD stenosis.  RFR in the  LAD was 0.75.  The distal left circumflex had about 50% stenosis.  The RCA had diffuse plaquing but no significant stenosis.  He is here today with his wife.  He is retired.  He has chronic disability due to multiple prior back surgeries causing back and lower extremity pain and weakness. Past Medical History:  Diagnosis Date   Anemia    SLIGHT ANEMIA 4 MONTHS AGO   Aortic valve stenosis 12/19/2017   Echo 07/30/16 - EF 55-60, mild aortic stenosis (mean 9) // Echo 4/19:  EF 60-65 mild AS (mean 11) // Echocardiogram 11/21: EF 60-65, no RWMA, mild LVH, Gr 1 DD, normal RVSF, trivial MR, calcified AV, mod AS (mean 20 mmHg, Vmax 280 cm/s, DI 0.27), trivial AI  // Echo 5/22: EF 60-65, moderate LVH, no RWMA, trivial MR, moderate AS (mean gradient 20 mmHg, V-max 287 cm/s, DI 0.35)    Arthritis    OA   Benign prostate hyperplasia    unspecified whether lower urinary tract sym. present    Carotid atherosclerosis    Carotid US  4/19:  bilat ICA 1-39; vertebrals and subclavians normal   Carotid atherosclerosis, bilateral 04/30/2018   1-39% on 12/2017 [facility] Echo   Coronary artery calcification seen on CT scan 06/05/2018   Dysrhythmia    ATRIAL FIB   Elevated prostate specific antigen (PSA) 08/30/2016   Glucose intolerance  pre-diabetic   Hayfever    Heart murmur    History of exercise stress test    GXT 4/19:  ETT with good exercise tolerance (10:00); no chest pain; normal BP response; no diagnostic ST changes; negative adequate ETT.   HOH (hard of hearing)    BOTH EARS   Low back pain with sciatica 06/02/2012   Lumbar herniated disc 01/30/2018   Mild aortic stenosis 04/30/2018   12/2017 [facility] Everywhere Echo : Mean gradient (S): 11 mm Hg. Peak gradient (S): 24 mm Hg.   Numbness of right thumb    FROM CERVICAL NECK SURGERY   Osteoarthritis of right hip 09/21/2018   Overweight (BMI 25.0-29.9) 09/30/2018   Paroxysmal atrial fibrillation (HCC) 06/05/2018   Pre-diabetes    Prediabetes  04/30/2018   not sure about this   Prostatitis    unspecified prostatitis type   Pure hypercholesterolemia     Past Surgical History:  Procedure Laterality Date   ANTERIOR LAT LUMBAR FUSION Right 06/29/2020   Procedure: Right Lumbar Two-Three Anterolateral lumbar interbody fusion with lateral plate;  Surgeon: Unice Pac, MD;  Location: [facility] OR;  Service: Neurosurgery;  Laterality: Right;  Right Lumbar Two-Three Anterolateral lumbar interbody fusion with lateral plate   CERVICAL SPINE SURGERY  2011   COLONOSCOPY     CORONARY ANGIOGRAPHY N/A 08/04/2024   Procedure: CORONARY ANGIOGRAPHY;  Surgeon: Wonda Sharper, MD;  Location: [facility] INVASIVE CV LAB;  Service: Cardiovascular;  Laterality: N/A;   CORONARY PRESSURE/FFR WITH 3D MAPPING N/A 08/04/2024   Procedure: Coronary Pressure/FFR w/3D Mapping;  Surgeon: Wonda Sharper, MD;  Location: [facility] INVASIVE CV LAB;  Service: Cardiovascular;  Laterality: N/A;   HIP SURGERY Right    arthroscopy   LUMBAR DISC SURGERY  2014   L2-3, L3 TO L4 X 2 2019 AT [facility]   LUMBAR FUSION  2019   psoas muscle repair (groin muscle?)  2022   SHOULDER ARTHROSCOPY Right    TOOTH EXTRACTION     TOTAL HIP ARTHROPLASTY Right 09/29/2018   Procedure: TOTAL HIP ARTHROPLASTY ANTERIOR APPROACH;  Surgeon: Ernie Cough, MD;  Location: [facility] ORS;  Service: Orthopedics;  Laterality: Right;  78    Family History  Problem Relation Age of Onset   Dementia Mother    Congestive Heart Failure Father    CAD Father        quad bypass at age 85   CAD Brother 54    Social History Social History[1]  Current Outpatient Medications  Medication Sig Dispense Refill   aspirin  EC 81 MG tablet Take 81 mg by mouth in the morning. Swallow whole.     buPROPion (WELLBUTRIN XL) 150 MG 24 hr tablet Take 150 mg by mouth every morning.     cetirizine (ZYRTEC) 10 MG tablet Take 10 mg by mouth in the morning.     diclofenac Sodium (VOLTAREN) 1 % GEL Apply 1 Application topically daily as needed  (pain).     flunisolide  (NASALIDE ) 25 MCG/ACT (0.025%) SOLN Place 1-2 sprays into the nose 2 (two) times daily as needed (nasal congestion). 25 mL 3   ipratropium (ATROVENT ) 0.03 % nasal spray Place 1-2 sprays into both nostrils 2 (two) times daily as needed (nasal drainage). 30 mL 3   methotrexate (RHEUMATREX) 2.5 MG tablet Take 10 mg by mouth every Thursday.     montelukast  (SINGULAIR ) 10 MG tablet Take 1 tablet (10 mg total) by mouth daily as needed (for allergies). (Patient taking differently: Take 10 mg by mouth in the  morning.) 90 tablet 0   rosuvastatin  (CRESTOR ) 20 MG tablet Take 20 mg by mouth at bedtime.     sildenafil (VIAGRA) 100 MG tablet Take 100 mg by mouth daily as needed for erectile dysfunction.     tadalafil (CIALIS) 20 MG tablet Take 20 mg by mouth in the morning.     No current facility-administered medications for this visit.    Allergies[2]  Review of Systems  Constitutional:  Positive for fatigue.  HENT: Negative.  Negative for dental problem.   Eyes: Negative.   Respiratory:  Positive for shortness of breath.   Cardiovascular:  Positive for chest pain. Negative for leg swelling.  Gastrointestinal: Negative.   Endocrine: Negative.   Genitourinary:        BPH  Musculoskeletal:  Positive for arthralgias.  Skin: Negative.   Allergic/Immunologic: Negative.   Neurological:  Positive for dizziness.  Hematological:  Bruises/bleeds easily.  Psychiatric/Behavioral: Negative.      BP (!) 150/88 (BP Location: Left Arm)   Pulse 77   Resp 18   Ht 5' 8 (1.727 m)   Wt 176 lb (79.8 kg)   SpO2 97% Comment: RA  BMI 26.76 kg/m  Physical Exam Constitutional:      Appearance: Normal appearance. He is normal weight.  HENT:     Head: Normocephalic and atraumatic.  Eyes:     Extraocular Movements: Extraocular movements intact.     Conjunctiva/sclera: Conjunctivae normal.     Pupils: Pupils are equal, round, and reactive to light.  Neck:     Vascular: No carotid  bruit.  Cardiovascular:     Rate and Rhythm: Normal rate and regular rhythm.     Pulses: Normal pulses.     Heart sounds: Murmur heard.     Comments: 3/6 systolic murmur RSB, no diastolic murmur Pulmonary:     Effort: Pulmonary effort is normal.     Breath sounds: Normal breath sounds.  Abdominal:     General: There is no distension.     Tenderness: There is no abdominal tenderness.  Musculoskeletal:        General: No swelling.     Cervical back: No tenderness.  Skin:    General: Skin is warm and dry.  Neurological:     General: No focal deficit present.     Mental Status: He is alert and oriented to person, place, and time.  Psychiatric:        Mood and Affect: Mood normal.        Behavior: Behavior normal.      Diagnostic Tests:  ECHOCARDIOGRAM LIMITED REPORT       Patient Name:   Cody Mcconnell Date of Exam: 07/15/2024  Medical Rec #:  [id number]          Height:       69.5 in  Accession #:    [id number]         Weight:       170.8 lb  Date of Birth:  01-02-1946          BSA:          1.942 m  Patient Age:    78 years           BP:           108/58 mmHg  Patient Gender: M                  HR:  60 bpm.  Exam Location:  [facility]   Procedure: Cardiac Doppler, Limited Echo and Limited Color Doppler (Both             Spectral and Color Flow Doppler were utilized during  procedure).   Indications:   I35.0 AS    History:        Patient has prior history of Echocardiogram examinations.  AS,                 Arrythmias:Atrial Fibrillation; Risk Factors:Dyslipidemia.    Sonographer:    Elsie Bohr RDCS  Referring Phys: [phone] MICHAEL COOPER   IMPRESSIONS     1. Left ventricular ejection fraction, by estimation, is 65 to 70%. The  left ventricle has normal function. The left ventricle has no regional  wall motion abnormalities. There is moderate concentric left ventricular  hypertrophy. Left ventricular  diastolic parameters are consistent with  Grade II diastolic dysfunction  (pseudonormalization).   2. Right ventricular systolic function is normal. The right ventricular  size is normal.   3. The mitral valve is normal in structure. Mild mitral valve  regurgitation.   4. AV is thickened, calcified with restricted motion. Peak and mean  gradients through the valve are 59 and 35 mm Hg respectively AVA (VTI) is  0.62 cm2 Dimensionless valve index is 0.22 Overall consistent with severe  AS. Note Stroke volume index low (31)   Compared to echo from 2024, mean gradient is increased (28 to 35 mm Hg).  Aortic valve regurgitation is mild.   FINDINGS   Left Ventricle: Left ventricular ejection fraction, by estimation, is 65  to 70%. The left ventricle has normal function. The left ventricle has no  regional wall motion abnormalities. The left ventricular internal cavity  size was normal in size. There is   moderate concentric left ventricular hypertrophy. Left ventricular  diastolic parameters are consistent with Grade II diastolic dysfunction  (pseudonormalization).   Right Ventricle: The right ventricular size is normal. Right vetricular  wall thickness was not assessed. Right ventricular systolic function is  normal.   Left Atrium: Left atrial size was normal in size.   Right Atrium: Right atrial size was normal in size.   Pericardium: There is no evidence of pericardial effusion.   Mitral Valve: The mitral valve is normal in structure. Mild mitral valve  regurgitation.   Tricuspid Valve: The tricuspid valve is normal in structure. Tricuspid  valve regurgitation is trivial.   Aortic Valve: AV is thickened, calcified with restricted motion. Peak and  mean gradients through the valve are 59 and 35 mm Hg respectively AVA  (VTI) is 0.62 cm2 Dimensionless valve index is 0.22 Overall consistent  with severe AS. Note Stroke volume  index low (31) Compared to echo from 2024, mean gradient is increased (28  to 35 mm Hg). Aortic  valve regurgitation is mild. Aortic regurgitation PHT  measures 363 msec. Aortic valve mean gradient measures 33.3 mmHg. Aortic  valve peak gradient measures  56.0 mmHg. Aortic valve area, by VTI measures 0.63 cm.   Pulmonic Valve: The pulmonic valve was normal in structure. Pulmonic valve  regurgitation is not visualized.   Aorta: The aortic root and ascending aorta are structurally normal, with  no evidence of dilitation.   IAS/Shunts: No atrial level shunt detected by color flow Doppler.   LEFT VENTRICLE  PLAX 2D  LVIDd:         4.10 cm   Diastology  LVIDs:  2.80 cm   LV e' medial:    6.85 cm/s  LV PW:         1.60 cm   LV E/e' medial:  13.9  LV IVS:        1.30 cm   LV e' lateral:   7.18 cm/s  LVOT diam:     1.90 cm   LV E/e' lateral: 13.3  LV SV:         60  LV SV Index:   31  LVOT Area:     2.84 cm     LEFT ATRIUM           Index        RIGHT ATRIUM  LA diam:      4.10 cm 2.11 cm/m   RA Pressure: 3.00 mmHg  LA Vol (A4C): 49.4 ml 25.44 ml/m   AORTIC VALVE  AV Area (Vmax):    0.68 cm  AV Area (Vmean):   0.64 cm  AV Area (VTI):     0.63 cm  AV Vmax:           374.00 cm/s  AV Vmean:          270.667 cm/s  AV VTI:            0.949 m  AV Peak Grad:      56.0 mmHg  AV Mean Grad:      33.3 mmHg  LVOT Vmax:         90.20 cm/s  LVOT Vmean:        61.000 cm/s  LVOT VTI:          0.212 m  LVOT/AV VTI ratio: 0.22  AI PHT:            363 msec    AORTA  Ao Root diam: 3.10 cm  Ao Asc diam:  3.30 cm   MITRAL VALVE               TRICUSPID VALVE  MV Area (PHT): 2.82 cm    Estimated RAP:  3.00 mmHg  MV Decel Time: 269 msec  MV E velocity: 95.40 cm/s  SHUNTS  MV A velocity: 80.20 cm/s  Systemic VTI:  0.21 m  MV E/A ratio:  1.19        Systemic Diam: 1.90 cm   Vina Gull MD  Electronically signed by Vina Gull MD  Signature Date/Time: 07/15/2024/7:42:12 PM        Final      rocedures  CORONARY ANGIOGRAPHY  Coronary Pressure/FFR w/3D Mapping    Conclusion      Ost LAD lesion is 80% stenosed.   Prox LAD to Mid LAD lesion is 50% stenosed.   Dist Cx lesion is 50% stenosed.   1.  Known severe aortic stenosis with bulky aortic valve calcification demonstrated on plain fluoroscopy 2.  Severe 80% ostial LAD stenosis and moderate 50% proximal LAD stenosis with a diffusely calcified LAD, confirmed hemodynamic significance with an RFR of 0.75. 3.  Patent left circumflex with mild diffuse plaquing and no significant stenosis 4.  Patent RCA with mild diffuse plaquing and no significant stenosis   Recommendations: Cardiac surgical referral for consideration of single vessel CABG/AVR      Impression:  This 78 year old gentleman has stage D, severe, symptomatic aortic stenosis with NYHA class II symptoms of exertional fatigue and shortness of breath as well as episodes of substernal chest discomfort consistent with angina.  His echocardiogram shows a severely calcified  and thickened aortic valve with restricted leaflet mobility.  The mean gradient is 35 mmHg with a valve area of 0.62 cm and a dimensionless index of 0.22 with low stroke-volume index of 31 consistent with severe aortic stenosis.  Left ventricular ejection fraction is normal.  Cardiac catheterization shows an 80% ostial LAD stenosis with an RFR of 0.75.  I agree that the best treatment for him is aortic valve replacement using a bioprosthetic valve and CABG to the LAD using a LIMA graft.  He has a history of paroxysmal atrial fibrillation but does not want to be on anticoagulation and has had a low burden of atrial fibrillation.  I think it would be worthwhile clipping his left atrial appendage to decrease his stroke risk.  I discussed the operative procedure with the patient and his wife including alternatives, benefits and risks; including but not limited to bleeding, blood transfusion, infection, stroke, myocardial infarction, graft failure, heart block requiring a permanent  pacemaker, organ dysfunction, and death.  Cody Mcconnell understands and agrees to proceed.     Plan:  Coronary artery bypass graft surgery x 1 with a LIMA to the LAD, aortic valve replacement using a bioprosthetic valve, and clipping of left atrial appendage on 09/07/2024.  I spent 45 minutes performing this consultation and > 50% of this time was spent face to face counseling and coordinating the care of this patient's severe aortic stenosis and single-vessel coronary disease.   Dorise MARLA Fellers, MD [facility] [phone]     [1]  Social History Tobacco Use   Smoking status: Former    Current packs/day: 0.00    Average packs/day: 2.0 packs/day for 9.0 years (18.0 ttl pk-yrs)    Types: Cigarettes    Start date: 58    Quit date: 88    Years since quitting: 48.0   Smokeless tobacco: Never   Tobacco comments:    QUIT   Vaping Use   Vaping status: Never Used  Substance Use Topics   Alcohol use: Never    Comment: rarely   Drug use: Never  [2] No Known Allergies  "

## 2024-08-31 NOTE — Progress Notes (Signed)
 .Surgical Instructions   Your procedure is scheduled on Tuesday, 09/07/24. Report to [facility] Main Entrance A at 5:30 A.M., then check in with the Admitting office. Any questions or running late day of surgery: call [phone]  Questions prior to your surgery date: call [phone], Monday-Friday, 8am-4pm. If you experience any cold or flu symptoms such as cough, fever, chills, shortness of breath, etc. between now and your scheduled surgery, please notify us  at the above number.     Remember:  Do not eat or drink after midnight the night before your surgery-Monday     Take these medicines the morning of surgery with A SIP OF WATER : buPROPion (WELLBUTRIN XL)  cetirizine (ZYRTEC)  montelukast  (SINGULAIR )    May take these medicines IF NEEDED: flunisolide  (NASALIDE ) nasal spray ipratropium (ATROVENT ) nasal spray   Continue Aspirin  but hold on the day of surgery per MD.  Last dose will be on Monday, 09/06/24.  Stop Methotrexate 5 days prior to surgery per MD.  Last dose should be on 09/01/24.    One week prior to surgery, STOP taking any Aspirin  (unless otherwise instructed by your surgeon) Aleve, Naproxen, Ibuprofen, Motrin, Advil, Goody's, BC's, all herbal medications, fish oil, and non-prescription vitamins.                     Do NOT Smoke (Tobacco/Vaping) for 24 hours prior to your procedure.  If you use a CPAP at night, you may bring your mask/headgear for your overnight stay.   You will be asked to remove any contacts, glasses, piercing's, hearing aid's, dentures/partials prior to surgery. Please bring cases for these items if needed.    Patients discharged the day of surgery will not be allowed to drive home, and someone needs to stay with them for 24 hours.  SURGICAL WAITING ROOM VISITATION Patients may have no more than 2 support people in the waiting area - these visitors may rotate.   Pre-op nurse will coordinate an appropriate time for 1 ADULT support  person, who may not rotate, to accompany patient in pre-op.  Children under the age of 59 must have an adult with them who is not the patient and must remain in the main waiting area with an adult.  If the patient needs to stay at the hospital during part of their recovery, the visitor guidelines for inpatient rooms apply.  Please refer to the [facility] website for the visitor guidelines for any additional information.   If you received a COVID test during your pre-op visit  it is requested that you wear a mask when out in public, stay away from anyone that may not be feeling well and notify your surgeon if you develop symptoms. If you have been in contact with anyone that has tested positive in the last 10 days please notify you surgeon.      Pre-operative CHG Bathing Instructions   You can play a key role in reducing the risk of infection after surgery. Your skin needs to be as free of germs as possible. You can reduce the number of germs on your skin by washing with CHG (chlorhexidine  gluconate) soap before surgery. CHG is an antiseptic soap that kills germs and continues to kill germs even after washing.   DO NOT use if you have an allergy to chlorhexidine /CHG or antibacterial soaps. If your skin becomes reddened or irritated, stop using the CHG and notify one of our RNs at [phone].  TAKE A SHOWER THE NIGHT BEFORE SURGERY   Please keep in mind the following:  DO NOT shave, including legs and underarms, 48 hours prior to surgery.   You may shave your face before/day of surgery.  Place clean sheets on your bed the night before surgery Use a clean washcloth (not used since being washed) for shower. DO NOT sleep with pet's night before surgery.  CHG Shower Instructions:  Wash your face and private area with normal soap. If you choose to wash your hair, wash first with your normal shampoo.  After you use shampoo/soap, rinse your hair and body thoroughly to remove  shampoo/soap residue.  Turn the water  OFF and apply half the bottle of CHG soap to a CLEAN washcloth.  Apply CHG soap ONLY FROM YOUR NECK DOWN TO YOUR TOES (washing for 3-5 minutes)  DO NOT use CHG soap on face, private areas, open wounds, or sores.  Pay special attention to the area where your surgery is being performed.  If you are having back surgery, having someone wash your back for you may be helpful. Wait 2 minutes after CHG soap is applied, then you may rinse off the CHG soap.  Pat dry with a clean towel  Put on clean pajamas    Additional instructions for the day of surgery: If you choose, you may shower the morning of surgery with an antibacterial soap.  DO NOT APPLY any lotions, deodorants, cologne, or perfumes.   Do not wear jewelry or makeup Do not wear nail polish, gel polish, artificial nails, or any other type of covering on natural nails (fingers and toes) Do not bring valuables to the hospital. [facility] is not responsible for valuables/personal belongings. Put on clean/comfortable clothes.  Please brush your teeth.  Ask your nurse before applying any prescription medications to the skin.

## 2024-09-03 ENCOUNTER — Ambulatory Visit (HOSPITAL_COMMUNITY)
Admission: RE | Admit: 2024-09-03 | Discharge: 2024-09-03 | Disposition: A | Discharge status: Home / Self Care | Source: Ambulatory Visit | Attending: Surgery | Admitting: Surgery

## 2024-09-03 ENCOUNTER — Encounter (HOSPITAL_COMMUNITY): Payer: Self-pay

## 2024-09-03 ENCOUNTER — Other Ambulatory Visit: Payer: Self-pay

## 2024-09-03 VITALS — BP 129/80 | HR 74 | Temp 98.2°F | Resp 16 | Ht 69.0 in | Wt 175.2 lb

## 2024-09-03 DIAGNOSIS — I2510 Atherosclerotic heart disease of native coronary artery without angina pectoris: Secondary | ICD-10-CM | POA: Diagnosis not present

## 2024-09-03 DIAGNOSIS — Z5181 Encounter for therapeutic drug level monitoring: Secondary | ICD-10-CM

## 2024-09-03 DIAGNOSIS — Z01818 Encounter for other preprocedural examination: Secondary | ICD-10-CM | POA: Insufficient documentation

## 2024-09-03 DIAGNOSIS — R7989 Other specified abnormal findings of blood chemistry: Secondary | ICD-10-CM | POA: Diagnosis not present

## 2024-09-03 DIAGNOSIS — I350 Nonrheumatic aortic (valve) stenosis: Secondary | ICD-10-CM | POA: Diagnosis not present

## 2024-09-03 LAB — COMPREHENSIVE METABOLIC PANEL WITH GFR
ALT: 20 U/L (ref 0–44)
AST: 20 U/L (ref 15–41)
Albumin: 4.3 g/dL (ref 3.5–5.0)
Alkaline Phosphatase: 74 U/L (ref 38–126)
Anion gap: 8 (ref 5–15)
BUN: 17 mg/dL (ref 8–23)
CO2: 26 mmol/L (ref 22–32)
Calcium: 9 mg/dL (ref 8.9–10.3)
Chloride: 106 mmol/L (ref 98–111)
Creatinine, Ser: 0.84 mg/dL (ref 0.61–1.24)
GFR, Estimated: >60 mL/min
Glucose, Bld: 115 mg/dL — ABNORMAL HIGH (ref 70–99)
Potassium: 4.3 mmol/L (ref 3.5–5.1)
Sodium: 139 mmol/L (ref 135–145)
Total Bilirubin: 0.4 mg/dL (ref 0.0–1.2)
Total Protein: 6.7 g/dL (ref 6.5–8.1)

## 2024-09-03 LAB — CBC
HCT: 44.5 % (ref 39.0–52.0)
Hemoglobin: 14.5 g/dL (ref 13.0–17.0)
MCH: 27.8 pg (ref 26.0–34.0)
MCHC: 32.6 g/dL (ref 30.0–36.0)
MCV: 85.4 fL (ref 80.0–100.0)
Platelets: 224 K/uL (ref 150–400)
RBC: 5.21 MIL/uL (ref 4.22–5.81)
RDW: 12.4 % (ref 11.5–15.5)
WBC: 6 K/uL (ref 4.0–10.5)
nRBC: 0 % (ref 0.0–0.2)

## 2024-09-03 LAB — URINALYSIS, ROUTINE W REFLEX MICROSCOPIC
Bilirubin Urine: NEGATIVE
Glucose, UA: NEGATIVE mg/dL
Hgb urine dipstick: NEGATIVE
Ketones, ur: NEGATIVE mg/dL
Leukocytes,Ua: NEGATIVE
Nitrite: NEGATIVE
Protein, ur: NEGATIVE mg/dL
Specific Gravity, Urine: 1.02 (ref 1.005–1.030)
pH: 5 (ref 5.0–8.0)

## 2024-09-03 LAB — PROTIME-INR
INR: 1.1 (ref 0.8–1.2)
Prothrombin Time: 15 s (ref 11.4–15.2)

## 2024-09-03 LAB — VAS US DOPPLER PRE CABG
Left ABI: 1.17
Right ABI: 1.11

## 2024-09-03 LAB — SURGICAL PCR SCREEN
MRSA, PCR: NEGATIVE
Staphylococcus aureus: POSITIVE — AB

## 2024-09-03 LAB — APTT: aPTT: 35 s (ref 24–36)

## 2024-09-03 LAB — HEMOGLOBIN A1C
Hgb A1c MFr Bld: 5.8 % — ABNORMAL HIGH (ref 4.8–5.6)
Mean Plasma Glucose: 119.76 mg/dL

## 2024-09-03 NOTE — Progress Notes (Signed)
 .Surgical Instructions   Your procedure is scheduled on Tuesday, September 07, 2024. Report to [facility] Main Entrance A at 5:30 A.M., then check in with the Admitting office. Any questions or running late day of surgery: call [phone]  Questions prior to your surgery date: call [phone], Monday-Friday, 8am-4pm. If you experience any cold or flu symptoms such as cough, fever, chills, shortness of breath, etc. between now and your scheduled surgery, please notify us  at the above number.     Remember:  Do not eat or drink after midnight the night before your surgery-Monday     Take these medicines the morning of surgery with A SIP OF WATER : buPROPion (WELLBUTRIN XL)  cetirizine (ZYRTEC)  montelukast  (SINGULAIR )    May take these medicines IF NEEDED: flunisolide  (NASALIDE ) nasal spray ipratropium (ATROVENT ) nasal spray   Continue Aspirin  but hold on the day of surgery per MD.  Last dose will be on Monday, 09/06/24.  Stop Methotrexate 5 days prior to surgery per MD.  Last dose should be on 09/01/24.    One week prior to surgery, STOP taking any Aleve, Naproxen, Ibuprofen, Motrin, Advil, Goody's, BC's, all herbal medications, fish oil, and non-prescription vitamins.                     Do NOT Smoke (Tobacco/Vaping) for 24 hours prior to your procedure.  If you use a CPAP at night, you may bring your mask/headgear for your overnight stay.   You will be asked to remove any contacts, glasses, piercing's, hearing aid's, dentures/partials prior to surgery. Please bring cases for these items if needed.    Patients discharged the day of surgery will not be allowed to drive home, and someone needs to stay with them for 24 hours.  SURGICAL WAITING ROOM VISITATION Patients may have no more than 2 support people in the waiting area - these visitors may rotate.   Pre-op nurse will coordinate an appropriate time for 1 ADULT support person, who may not rotate, to accompany patient  in pre-op.  Children under the age of 41 must have an adult with them who is not the patient and must remain in the main waiting area with an adult.  If the patient needs to stay at the hospital during part of their recovery, the visitor guidelines for inpatient rooms apply.  Please refer to the [facility] website for the visitor guidelines for any additional information.   If you received a COVID test during your pre-op visit  it is requested that you wear a mask when out in public, stay away from anyone that may not be feeling well and notify your surgeon if you develop symptoms. If you have been in contact with anyone that has tested positive in the last 10 days please notify you surgeon.      Pre-operative CHG Bathing Instructions   You can play a key role in reducing the risk of infection after surgery. Your skin needs to be as free of germs as possible. You can reduce the number of germs on your skin by washing with CHG (chlorhexidine  gluconate) soap before surgery. CHG is an antiseptic soap that kills germs and continues to kill germs even after washing.   DO NOT use if you have an allergy to chlorhexidine /CHG or antibacterial soaps. If your skin becomes reddened or irritated, stop using the CHG and notify one of our RNs at [phone].  TAKE A SHOWER THE NIGHT BEFORE SURGERY   Please keep in mind the following:  You may shave your face before/day of surgery.  Place clean sheets on your bed the night before surgery Use a clean washcloth (not used since being washed) for shower. DO NOT sleep with pet's night before surgery.  CHG Shower Instructions:  Wash your face and private area with normal soap. If you choose to wash your hair, wash first with your normal shampoo.  After you use shampoo/soap, rinse your hair and body thoroughly to remove shampoo/soap residue.  Turn the water  OFF and apply half the bottle of CHG soap to a CLEAN washcloth.  Apply CHG soap ONLY  FROM YOUR NECK DOWN TO YOUR TOES (washing for 3-5 minutes)  DO NOT use CHG soap on face, private areas, open wounds, or sores.  Pay special attention to the area where your surgery is being performed.  If you are having back surgery, having someone wash your back for you may be helpful. Wait 2 minutes after CHG soap is applied, then you may rinse off the CHG soap.  Pat dry with a clean towel  Put on clean pajamas    Additional instructions for the day of surgery: If you choose, you may shower the morning of surgery with an antibacterial soap.  DO NOT APPLY any lotions, deodorants or cologne.   Do not wear jewelry  Do not bring valuables to the hospital. [facility] is not responsible for valuables/personal belongings. Put on clean/comfortable clothes.  Please brush your teeth.  Ask your nurse before applying any prescription medications to the skin.

## 2024-09-03 NOTE — Progress Notes (Signed)
 PCP - Izetta Cork, NP Cardiologist - Dr. Ozell Fell, LOV 07/20/2024  PPM/ICD - denies Device Orders - na Rep Notified - na  Chest x-ray - PAT, 09/03/2024 EKG - PAT, 09/03/2024 Stress Test - 05/08/2023 ECHO - 07/15/2024 Cardiac Cath - 08/04/2024  Sleep Study - denies CPAP - na  Non-diabetic  Blood Thinner Instructions:denies Aspirin  Instructions:continue, hold DOS  ERAS Protcol - NPO  Anesthesia review: Yes. CAD, heart murmur, A-fib, cardiac clearance  Patient denies shortness of breath, fever, cough and chest pain at PAT appointment   All instructions explained to the patient, with a verbal understanding of the material. Patient agrees to go over the instructions while at home for a better understanding. Patient also instructed to self quarantine after being tested for COVID-19. The opportunity to ask questions was provided.

## 2024-09-06 MED ORDER — TRANEXAMIC ACID 1000 MG/10ML IV SOLN
1.5000 mg/kg/h | INTRAVENOUS | Status: AC
  Administered 2024-09-07: 1.5 mg/kg/h via INTRAVENOUS
  Filled 2024-09-06: qty 25

## 2024-09-06 MED ORDER — CEFAZOLIN SODIUM-DEXTROSE 2-4 GM/100ML-% IV SOLN
2.0000 g | INTRAVENOUS | Status: AC
  Administered 2024-09-07 (×2): 2 g via INTRAVENOUS
  Filled 2024-09-06: qty 100

## 2024-09-06 MED ORDER — NOREPINEPHRINE 4 MG/250ML-% IV SOLN
0.0000 ug/min | INTRAVENOUS | Status: DC
  Filled 2024-09-06: qty 250

## 2024-09-06 MED ORDER — EPINEPHRINE HCL 5 MG/250ML IV SOLN IN NS
0.0000 ug/min | INTRAVENOUS | Status: DC
  Filled 2024-09-06: qty 250

## 2024-09-06 MED ORDER — PLASMA-LYTE A IV SOLN
INTRAVENOUS | Status: DC
  Filled 2024-09-06: qty 2.5

## 2024-09-06 MED ORDER — CEFAZOLIN SODIUM-DEXTROSE 2-4 GM/100ML-% IV SOLN
2.0000 g | INTRAVENOUS | Status: DC
  Filled 2024-09-06: qty 100

## 2024-09-06 MED ORDER — DEXMEDETOMIDINE HCL IN NACL 400 MCG/100ML IV SOLN
0.1000 ug/kg/h | INTRAVENOUS | Status: AC
  Administered 2024-09-07: .5 ug/kg/h via INTRAVENOUS
  Filled 2024-09-06: qty 100

## 2024-09-06 MED ORDER — MILRINONE LACTATE IN DEXTROSE 20-5 MG/100ML-% IV SOLN
0.3000 ug/kg/min | INTRAVENOUS | Status: DC
  Filled 2024-09-06: qty 100

## 2024-09-06 MED ORDER — POTASSIUM CHLORIDE 2 MEQ/ML IV SOLN
80.0000 meq | INTRAVENOUS | Status: DC
  Filled 2024-09-06: qty 40

## 2024-09-06 MED ORDER — MAGNESIUM SULFATE 50 % IJ SOLN
40.0000 meq | INTRAMUSCULAR | Status: DC
  Filled 2024-09-06: qty 9.85

## 2024-09-06 MED ORDER — TRANEXAMIC ACID (OHS) BOLUS VIA INFUSION
15.0000 mg/kg | INTRAVENOUS | Status: AC
  Administered 2024-09-07: 1192.5 mg via INTRAVENOUS
  Filled 2024-09-06: qty 1193

## 2024-09-06 MED ORDER — NITROGLYCERIN IN D5W 200-5 MCG/ML-% IV SOLN
2.0000 ug/min | INTRAVENOUS | Status: DC
  Filled 2024-09-06: qty 250

## 2024-09-06 MED ORDER — INSULIN REGULAR(HUMAN) IN NACL 100-0.9 UT/100ML-% IV SOLN
INTRAVENOUS | Status: AC
  Administered 2024-09-07: 1.1 [IU]/h via INTRAVENOUS
  Filled 2024-09-06: qty 100

## 2024-09-06 MED ORDER — VANCOMYCIN HCL 1250 MG/250ML IV SOLN
1250.0000 mg | INTRAVENOUS | Status: AC
  Administered 2024-09-07: 1250 mg via INTRAVENOUS
  Filled 2024-09-06: qty 250

## 2024-09-06 MED ORDER — TRANEXAMIC ACID (OHS) PUMP PRIME SOLUTION
2.0000 mg/kg | INTRAVENOUS | Status: DC
  Filled 2024-09-06: qty 1.59

## 2024-09-06 MED ORDER — PHENYLEPHRINE HCL-NACL 20-0.9 MG/250ML-% IV SOLN
30.0000 ug/min | INTRAVENOUS | Status: AC
  Administered 2024-09-07: 15 ug/min via INTRAVENOUS
  Filled 2024-09-06: qty 250

## 2024-09-06 MED ORDER — HEPARIN 30,000 UNITS/1000 ML (OHS) CELLSAVER SOLUTION
Status: DC
  Filled 2024-09-06: qty 1000

## 2024-09-06 NOTE — H&P (Signed)
 "  [address], [place] [address]             [phone]     Cardiothoracic Surgery Admission History and Physical   PCP is Corlis Pagan, NP Referring Provider is Wonda Sharper, MD      Chief Complaint  Patient presents with   Coronary Artery Disease   Aortic Stenosis      HPI:   The patient is a 78 year old gentleman with a history of hyperlipidemia, hypertension, paroxysmal atrial fibrillation, and moderate aortic stenosis followed by echocardiogram.  A 2D echocardiogram on 04/02/2023 showed a severely thickened and calcified aortic valve with a mean gradient of 31 mmHg.  Left ventricular ejection fraction was 55 to 60%.  Since then he had 2 different episodes of exertional chest discomfort and shortness of breath associated with activity.  The first occurred when he was in [place] walking up hills.  He developed a severe squeezing sensation in his chest relieved with rest.  He had a second episode when he was at the [facility] attending a pain rehabilitation clinic and had to do a lot of walking.  This was a similar severe squeezing sensation in the chest.  Since then he has had intermittent episodes of this squeezing sensation in his chest as well as exertional shortness of breath and fatigue.  If he is up doing activity in the morning for a few hours then he needs to take a nap for a few hours.  He still swims and has had no exertional chest discomfort or shortness of breath with this.  His denies orthopnea and peripheral edema.  He has had dizziness but no syncope.  He had a follow-up echocardiogram on 07/15/2024 showing an increase in the mean gradient to 35 mmHg with a valve area by VTI of 0.62 cm and dimensionless index of 0.22.  Stroke-volume index was low at 31.  Left ventricular ejection fraction was 65 to 70%.  He subsequently underwent cardiac catheterization on 08/04/2024 showing an ostial LAD stenosis estimated 80%.  There was also proximal to mid 50%  LAD stenosis.  RFR in the LAD was 0.75.  The distal left circumflex had about 50% stenosis.  The RCA had diffuse plaquing but no significant stenosis.   He lives with his wife.  He is retired.  He has chronic disability due to multiple prior back surgeries causing back and lower extremity pain and weakness.     Past Medical History:  Diagnosis Date   Anemia      SLIGHT ANEMIA 4 MONTHS AGO   Aortic valve stenosis 12/19/2017    Echo 07/30/16 - EF 55-60, mild aortic stenosis (mean 9) // Echo 4/19:  EF 60-65 mild AS (mean 11) // Echocardiogram 11/21: EF 60-65, no RWMA, mild LVH, Gr 1 DD, normal RVSF, trivial MR, calcified AV, mod AS (mean 20 mmHg, Vmax 280 cm/s, DI 0.27), trivial AI  // Echo 5/22: EF 60-65, moderate LVH, no RWMA, trivial MR, moderate AS (mean gradient 20 mmHg, V-max 287 cm/s, DI 0.35)    Arthritis      OA   Benign prostate hyperplasia      unspecified whether lower urinary tract sym. present    Carotid atherosclerosis      Carotid US  4/19:  bilat ICA 1-39; vertebrals and subclavians normal   Carotid atherosclerosis, bilateral 04/30/2018    1-39% on 12/2017 Care Everywhere Echo   Coronary artery calcification seen on CT scan  06/05/2018   Dysrhythmia      ATRIAL FIB   Elevated prostate specific antigen (PSA) 08/30/2016   Glucose intolerance      pre-diabetic   Hayfever     Heart murmur     History of exercise stress test      GXT 4/19:  ETT with good exercise tolerance (10:00); no chest pain; normal BP response; no diagnostic ST changes; negative adequate ETT.   HOH (hard of hearing)      BOTH EARS   Low back pain with sciatica 06/02/2012   Lumbar herniated disc 01/30/2018   Mild aortic stenosis 04/30/2018    12/2017 Care Everywhere Echo : Mean gradient (S): 11 mm Hg. Peak gradient (S): 24 mm Hg.   Numbness of right thumb      FROM CERVICAL NECK SURGERY   Osteoarthritis of right hip 09/21/2018   Overweight (BMI 25.0-29.9) 09/30/2018   Paroxysmal atrial fibrillation  (HCC) 06/05/2018   Pre-diabetes     Prediabetes 04/30/2018    not sure about this   Prostatitis      unspecified prostatitis type   Pure hypercholesterolemia                 Past Surgical History:  Procedure Laterality Date   ANTERIOR LAT LUMBAR FUSION Right 06/29/2020    Procedure: Right Lumbar Two-Three Anterolateral lumbar interbody fusion with lateral plate;  Surgeon: Unice Pac, MD;  Location: [facility] OR;  Service: Neurosurgery;  Laterality: Right;  Right Lumbar Two-Three Anterolateral lumbar interbody fusion with lateral plate   CERVICAL SPINE SURGERY   2011   COLONOSCOPY       CORONARY ANGIOGRAPHY N/A 08/04/2024    Procedure: CORONARY ANGIOGRAPHY;  Surgeon: Wonda Sharper, MD;  Location: [facility] INVASIVE CV LAB;  Service: Cardiovascular;  Laterality: N/A;   CORONARY PRESSURE/FFR WITH 3D MAPPING N/A 08/04/2024    Procedure: Coronary Pressure/FFR w/3D Mapping;  Surgeon: Wonda Sharper, MD;  Location: [facility] INVASIVE CV LAB;  Service: Cardiovascular;  Laterality: N/A;   HIP SURGERY Right      arthroscopy   LUMBAR DISC SURGERY   2014    L2-3, L3 TO L4 X 2 2019 AT [facility]   LUMBAR FUSION   2019   psoas muscle repair (groin muscle?)   2022   SHOULDER ARTHROSCOPY Right     TOOTH EXTRACTION       TOTAL HIP ARTHROPLASTY Right 09/29/2018    Procedure: TOTAL HIP ARTHROPLASTY ANTERIOR APPROACH;  Surgeon: Ernie Cough, MD;  Location: [facility] ORS;  Service: Orthopedics;  Laterality: Right;  71               Family History  Problem Relation Age of Onset   Dementia Mother     Congestive Heart Failure Father     CAD Father          quad bypass at age 54   CAD Brother 35          Social History [Social History]  [Social History]      Tobacco Use   Smoking status: Former      Current packs/day: 0.00      Average packs/day: 2.0 packs/day for 9.0 years (18.0 ttl pk-yrs)      Types: Cigarettes      Start date: 80      Quit date: 1978      Years since quitting: 48.0   Smokeless  tobacco: Never   Tobacco comments:      QUIT   Vaping  Use   Vaping status: Never Used  Substance Use Topics   Alcohol use: Never      Comment: rarely   Drug use: Never           Current Outpatient Medications  Medication Sig Dispense Refill   aspirin  EC 81 MG tablet Take 81 mg by mouth in the morning. Swallow whole.       buPROPion (WELLBUTRIN XL) 150 MG 24 hr tablet Take 150 mg by mouth every morning.       cetirizine (ZYRTEC) 10 MG tablet Take 10 mg by mouth in the morning.       diclofenac Sodium (VOLTAREN) 1 % GEL Apply 1 Application topically daily as needed (pain).       flunisolide  (NASALIDE ) 25 MCG/ACT (0.025%) SOLN Place 1-2 sprays into the nose 2 (two) times daily as needed (nasal congestion). 25 mL 3   ipratropium (ATROVENT ) 0.03 % nasal spray Place 1-2 sprays into both nostrils 2 (two) times daily as needed (nasal drainage). 30 mL 3   methotrexate (RHEUMATREX) 2.5 MG tablet Take 10 mg by mouth every Thursday.       montelukast  (SINGULAIR ) 10 MG tablet Take 1 tablet (10 mg total) by mouth daily as needed (for allergies). (Patient taking differently: Take 10 mg by mouth in the morning.) 90 tablet 0   rosuvastatin  (CRESTOR ) 20 MG tablet Take 20 mg by mouth at bedtime.       sildenafil (VIAGRA) 100 MG tablet Take 100 mg by mouth daily as needed for erectile dysfunction.       tadalafil (CIALIS) 20 MG tablet Take 20 mg by mouth in the morning.          No current facility-administered medications for this visit.        [Allergies]  [Allergies] No Known Allergies   Review of Systems  Constitutional:  Positive for fatigue.  HENT: Negative.  Negative for dental problem.   Eyes: Negative.   Respiratory:  Positive for shortness of breath.   Cardiovascular:  Positive for chest pain. Negative for leg swelling.  Gastrointestinal: Negative.   Endocrine: Negative.   Genitourinary:        BPH  Musculoskeletal:  Positive for arthralgias.  Skin: Negative.    Allergic/Immunologic: Negative.   Neurological:  Positive for dizziness.  Hematological:  Bruises/bleeds easily.  Psychiatric/Behavioral: Negative.       BP (!) 150/88 (BP Location: Left Arm)   Pulse 77   Resp 18   Ht 5' 8 (1.727 m)   Wt 176 lb (79.8 kg)   SpO2 97% Comment: RA  BMI 26.76 kg/m  Physical Exam Constitutional:      Appearance: Normal appearance. He is normal weight.  HENT:     Head: Normocephalic and atraumatic.  Eyes:     Extraocular Movements: Extraocular movements intact.     Conjunctiva/sclera: Conjunctivae normal.     Pupils: Pupils are equal, round, and reactive to light.  Neck:     Vascular: No carotid bruit.  Cardiovascular:     Rate and Rhythm: Normal rate and regular rhythm.     Pulses: Normal pulses.     Heart sounds: Murmur heard.     Comments: 3/6 systolic murmur RSB, no diastolic murmur Pulmonary:     Effort: Pulmonary effort is normal.     Breath sounds: Normal breath sounds.  Abdominal:     General: There is no distension.     Tenderness: There is no abdominal tenderness.  Musculoskeletal:  General: No swelling.     Cervical back: No tenderness.  Skin:    General: Skin is warm and dry.  Neurological:     General: No focal deficit present.     Mental Status: He is alert and oriented to person, place, and time.  Psychiatric:        Mood and Affect: Mood normal.        Behavior: Behavior normal.         Diagnostic Tests:   ECHOCARDIOGRAM LIMITED REPORT       Patient Name:   DENNIS HEGEMAN Date of Exam: 07/15/2024  Medical Rec #:  [id number]          Height:       69.5 in  Accession #:    [id number]         Weight:       170.8 lb  Date of Birth:  November 10, 1945          BSA:          1.942 m  Patient Age:    78 years           BP:           108/58 mmHg  Patient Gender: M                  HR:           60 bpm.  Exam Location:  [facility]   Procedure: Cardiac Doppler, Limited Echo and Limited Color Doppler (Both              Spectral and Color Flow Doppler were utilized during  procedure).   Indications:   I35.0 AS    History:        Patient has prior history of Echocardiogram examinations.  AS,                 Arrythmias:Atrial Fibrillation; Risk Factors:Dyslipidemia.    Sonographer:    Elsie Bohr RDCS  Referring Phys: [phone] MICHAEL COOPER   IMPRESSIONS     1. Left ventricular ejection fraction, by estimation, is 65 to 70%. The  left ventricle has normal function. The left ventricle has no regional  wall motion abnormalities. There is moderate concentric left ventricular  hypertrophy. Left ventricular  diastolic parameters are consistent with Grade II diastolic dysfunction  (pseudonormalization).   2. Right ventricular systolic function is normal. The right ventricular  size is normal.   3. The mitral valve is normal in structure. Mild mitral valve  regurgitation.   4. AV is thickened, calcified with restricted motion. Peak and mean  gradients through the valve are 59 and 35 mm Hg respectively AVA (VTI) is  0.62 cm2 Dimensionless valve index is 0.22 Overall consistent with severe  AS. Note Stroke volume index low (31)   Compared to echo from 2024, mean gradient is increased (28 to 35 mm Hg).  Aortic valve regurgitation is mild.   FINDINGS   Left Ventricle: Left ventricular ejection fraction, by estimation, is 65  to 70%. The left ventricle has normal function. The left ventricle has no  regional wall motion abnormalities. The left ventricular internal cavity  size was normal in size. There is   moderate concentric left ventricular hypertrophy. Left ventricular  diastolic parameters are consistent with Grade II diastolic dysfunction  (pseudonormalization).   Right Ventricle: The right ventricular size is normal. Right vetricular  wall thickness was not assessed. Right ventricular systolic function is  normal.  Left Atrium: Left atrial size was normal in size.   Right Atrium: Right  atrial size was normal in size.   Pericardium: There is no evidence of pericardial effusion.   Mitral Valve: The mitral valve is normal in structure. Mild mitral valve  regurgitation.   Tricuspid Valve: The tricuspid valve is normal in structure. Tricuspid  valve regurgitation is trivial.   Aortic Valve: AV is thickened, calcified with restricted motion. Peak and  mean gradients through the valve are 59 and 35 mm Hg respectively AVA  (VTI) is 0.62 cm2 Dimensionless valve index is 0.22 Overall consistent  with severe AS. Note Stroke volume  index low (31) Compared to echo from 2024, mean gradient is increased (28  to 35 mm Hg). Aortic valve regurgitation is mild. Aortic regurgitation PHT  measures 363 msec. Aortic valve mean gradient measures 33.3 mmHg. Aortic  valve peak gradient measures  56.0 mmHg. Aortic valve area, by VTI measures 0.63 cm.   Pulmonic Valve: The pulmonic valve was normal in structure. Pulmonic valve  regurgitation is not visualized.   Aorta: The aortic root and ascending aorta are structurally normal, with  no evidence of dilitation.   IAS/Shunts: No atrial level shunt detected by color flow Doppler.   LEFT VENTRICLE  PLAX 2D  LVIDd:         4.10 cm   Diastology  LVIDs:         2.80 cm   LV e' medial:    6.85 cm/s  LV PW:         1.60 cm   LV E/e' medial:  13.9  LV IVS:        1.30 cm   LV e' lateral:   7.18 cm/s  LVOT diam:     1.90 cm   LV E/e' lateral: 13.3  LV SV:         60  LV SV Index:   31  LVOT Area:     2.84 cm     LEFT ATRIUM           Index        RIGHT ATRIUM  LA diam:      4.10 cm 2.11 cm/m   RA Pressure: 3.00 mmHg  LA Vol (A4C): 49.4 ml 25.44 ml/m   AORTIC VALVE  AV Area (Vmax):    0.68 cm  AV Area (Vmean):   0.64 cm  AV Area (VTI):     0.63 cm  AV Vmax:           374.00 cm/s  AV Vmean:          270.667 cm/s  AV VTI:            0.949 m  AV Peak Grad:      56.0 mmHg  AV Mean Grad:      33.3 mmHg  LVOT Vmax:         90.20  cm/s  LVOT Vmean:        61.000 cm/s  LVOT VTI:          0.212 m  LVOT/AV VTI ratio: 0.22  AI PHT:            363 msec    AORTA  Ao Root diam: 3.10 cm  Ao Asc diam:  3.30 cm   MITRAL VALVE               TRICUSPID VALVE  MV Area (PHT): 2.82 cm    Estimated RAP:  3.00 mmHg  MV Decel Time: 269 msec  MV E velocity: 95.40 cm/s  SHUNTS  MV A velocity: 80.20 cm/s  Systemic VTI:  0.21 m  MV E/A ratio:  1.19        Systemic Diam: 1.90 cm   Vina Gull MD  Electronically signed by Vina Gull MD  Signature Date/Time: 07/15/2024/7:42:12 PM        Final        rocedures   CORONARY ANGIOGRAPHY  Coronary Pressure/FFR w/3D Mapping    Conclusion       Ost LAD lesion is 80% stenosed.   Prox LAD to Mid LAD lesion is 50% stenosed.   Dist Cx lesion is 50% stenosed.   1.  Known severe aortic stenosis with bulky aortic valve calcification demonstrated on plain fluoroscopy 2.  Severe 80% ostial LAD stenosis and moderate 50% proximal LAD stenosis with a diffusely calcified LAD, confirmed hemodynamic significance with an RFR of 0.75. 3.  Patent left circumflex with mild diffuse plaquing and no significant stenosis 4.  Patent RCA with mild diffuse plaquing and no significant stenosis   Recommendations: Cardiac surgical referral for consideration of single vessel CABG/AVR         Impression:   This 78 year old gentleman has stage D, severe, symptomatic aortic stenosis with NYHA class II symptoms of exertional fatigue and shortness of breath as well as episodes of substernal chest discomfort consistent with angina.  His echocardiogram shows a severely calcified and thickened aortic valve with restricted leaflet mobility.  The mean gradient is 35 mmHg with a valve area of 0.62 cm and a dimensionless index of 0.22 with low stroke-volume index of 31 consistent with severe aortic stenosis.  Left ventricular ejection fraction is normal.  Cardiac catheterization shows an 80% ostial LAD stenosis with  an RFR of 0.75.  I agree that the best treatment for him is aortic valve replacement using a bioprosthetic valve and CABG to the LAD using a LIMA graft.  He has a history of paroxysmal atrial fibrillation but does not want to be on anticoagulation and has had a low burden of atrial fibrillation.  I think it would be worthwhile clipping his left atrial appendage to decrease his stroke risk.  I discussed the operative procedure with the patient and his wife including alternatives, benefits and risks; including but not limited to bleeding, blood transfusion, infection, stroke, myocardial infarction, graft failure, heart block requiring a permanent pacemaker, organ dysfunction, and death.  Galo A Torpey understands and agrees to proceed.     Plan:   Coronary artery bypass graft surgery x 1 with a LIMA to the LAD, aortic valve replacement using a bioprosthetic valve, and clipping of left atrial appendage.       Dorise MARLA Fellers, MD [facility] [phone]   "

## 2024-09-06 NOTE — Anesthesia Preprocedure Evaluation (Signed)
"                                    Anesthesia Evaluation  Patient identified by MRN, date of birth, ID band Patient awake    Reviewed: Allergy & Precautions, H&P , NPO status , Patient's Chart, lab work & pertinent test results  Airway Mallampati: III  TM Distance: >3 FB Neck ROM: Full    Dental no notable dental hx. (+) Teeth Intact, Dental Advisory Given   Pulmonary neg pulmonary ROS, former smoker   Pulmonary exam normal breath sounds clear to auscultation       Cardiovascular Exercise Tolerance: Good + CAD  + dysrhythmias Atrial Fibrillation + Valvular Problems/Murmurs AS  Rhythm:Regular Rate:Normal     Neuro/Psych negative neurological ROS  negative psych ROS   GI/Hepatic negative GI ROS, Neg liver ROS,,,  Endo/Other  negative endocrine ROS    Renal/GU negative Renal ROS  negative genitourinary   Musculoskeletal  (+) Arthritis , Osteoarthritis,    Abdominal   Peds  Hematology  (+) Blood dyscrasia, anemia   Anesthesia Other Findings   Reproductive/Obstetrics negative OB ROS                              Anesthesia Physical Anesthesia Plan  ASA: 4  Anesthesia Plan: General   Post-op Pain Management: Tylenol  PO (pre-op)*   Induction: Intravenous  PONV Risk Score and Plan: 2 and Ondansetron , Dexamethasone  and Midazolam   Airway Management Planned: Oral ETT  Additional Equipment: Arterial line, CVP, PA Cath, TEE and Ultrasound Guidance Line Placement  Intra-op Plan:   Post-operative Plan: Post-operative intubation/ventilation  Informed Consent: I have reviewed the patients History and Physical, chart, labs and discussed the procedure including the risks, benefits and alternatives for the proposed anesthesia with the patient or authorized representative who has indicated his/her understanding and acceptance.     Dental advisory given  Plan Discussed with: CRNA  Anesthesia Plan Comments:           Anesthesia Quick Evaluation  "

## 2024-09-07 ENCOUNTER — Inpatient Hospital Stay (HOSPITAL_COMMUNITY)

## 2024-09-07 ENCOUNTER — Inpatient Hospital Stay (HOSPITAL_COMMUNITY)
Admission: RE | Disposition: A | Discharge status: Home Health | Payer: Self-pay | Source: Home / Self Care | Attending: Surgery

## 2024-09-07 ENCOUNTER — Other Ambulatory Visit: Payer: Self-pay | Admitting: Cardiology

## 2024-09-07 ENCOUNTER — Inpatient Hospital Stay (HOSPITAL_COMMUNITY): Payer: Self-pay | Admitting: Certified Registered Nurse Anesthetist

## 2024-09-07 ENCOUNTER — Inpatient Hospital Stay (HOSPITAL_COMMUNITY)
Admission: RE | Admit: 2024-09-07 | Discharge: 2024-09-12 | DRG: 220 | Disposition: A | Discharge status: Home Health | Attending: Surgery | Admitting: Surgery

## 2024-09-07 ENCOUNTER — Encounter (HOSPITAL_COMMUNITY): Payer: Self-pay | Admitting: Surgery

## 2024-09-07 DIAGNOSIS — Z818 Family history of other mental and behavioral disorders: Secondary | ICD-10-CM | POA: Diagnosis not present

## 2024-09-07 DIAGNOSIS — Z953 Presence of xenogenic heart valve: Secondary | ICD-10-CM | POA: Diagnosis not present

## 2024-09-07 DIAGNOSIS — M549 Dorsalgia, unspecified: Secondary | ICD-10-CM | POA: Diagnosis present

## 2024-09-07 DIAGNOSIS — I350 Nonrheumatic aortic (valve) stenosis: Secondary | ICD-10-CM | POA: Diagnosis present

## 2024-09-07 DIAGNOSIS — R0689 Other abnormalities of breathing: Secondary | ICD-10-CM | POA: Diagnosis not present

## 2024-09-07 DIAGNOSIS — I480 Paroxysmal atrial fibrillation: Secondary | ICD-10-CM | POA: Diagnosis not present

## 2024-09-07 DIAGNOSIS — D62 Acute posthemorrhagic anemia: Secondary | ICD-10-CM | POA: Diagnosis not present

## 2024-09-07 DIAGNOSIS — I10 Essential (primary) hypertension: Secondary | ICD-10-CM

## 2024-09-07 DIAGNOSIS — R739 Hyperglycemia, unspecified: Secondary | ICD-10-CM | POA: Diagnosis not present

## 2024-09-07 DIAGNOSIS — Z79899 Other long term (current) drug therapy: Secondary | ICD-10-CM | POA: Diagnosis not present

## 2024-09-07 DIAGNOSIS — Z96641 Presence of right artificial hip joint: Secondary | ICD-10-CM | POA: Diagnosis present

## 2024-09-07 DIAGNOSIS — Z87891 Personal history of nicotine dependence: Secondary | ICD-10-CM

## 2024-09-07 DIAGNOSIS — I4819 Other persistent atrial fibrillation: Secondary | ICD-10-CM | POA: Diagnosis not present

## 2024-09-07 DIAGNOSIS — N400 Enlarged prostate without lower urinary tract symptoms: Secondary | ICD-10-CM

## 2024-09-07 DIAGNOSIS — I4891 Unspecified atrial fibrillation: Secondary | ICD-10-CM

## 2024-09-07 DIAGNOSIS — I358 Other nonrheumatic aortic valve disorders: Secondary | ICD-10-CM | POA: Diagnosis present

## 2024-09-07 DIAGNOSIS — J951 Acute pulmonary insufficiency following thoracic surgery: Secondary | ICD-10-CM

## 2024-09-07 DIAGNOSIS — H8109 Meniere's disease, unspecified ear: Secondary | ICD-10-CM

## 2024-09-07 DIAGNOSIS — Z9889 Other specified postprocedural states: Secondary | ICD-10-CM

## 2024-09-07 DIAGNOSIS — G8929 Other chronic pain: Secondary | ICD-10-CM | POA: Diagnosis present

## 2024-09-07 DIAGNOSIS — Z981 Arthrodesis status: Secondary | ICD-10-CM | POA: Diagnosis not present

## 2024-09-07 DIAGNOSIS — Z8249 Family history of ischemic heart disease and other diseases of the circulatory system: Secondary | ICD-10-CM | POA: Diagnosis not present

## 2024-09-07 DIAGNOSIS — D6959 Other secondary thrombocytopenia: Secondary | ICD-10-CM

## 2024-09-07 DIAGNOSIS — Z952 Presence of prosthetic heart valve: Secondary | ICD-10-CM | POA: Diagnosis not present

## 2024-09-07 DIAGNOSIS — I2510 Atherosclerotic heart disease of native coronary artery without angina pectoris: Secondary | ICD-10-CM

## 2024-09-07 DIAGNOSIS — J302 Other seasonal allergic rhinitis: Secondary | ICD-10-CM

## 2024-09-07 DIAGNOSIS — E7800 Pure hypercholesterolemia, unspecified: Secondary | ICD-10-CM | POA: Diagnosis present

## 2024-09-07 DIAGNOSIS — H908 Mixed conductive and sensorineural hearing loss, unspecified: Secondary | ICD-10-CM | POA: Diagnosis present

## 2024-09-07 DIAGNOSIS — Z7982 Long term (current) use of aspirin: Secondary | ICD-10-CM | POA: Diagnosis not present

## 2024-09-07 DIAGNOSIS — R7303 Prediabetes: Secondary | ICD-10-CM

## 2024-09-07 DIAGNOSIS — E785 Hyperlipidemia, unspecified: Secondary | ICD-10-CM | POA: Diagnosis not present

## 2024-09-07 DIAGNOSIS — M1611 Unilateral primary osteoarthritis, right hip: Secondary | ICD-10-CM | POA: Diagnosis present

## 2024-09-07 DIAGNOSIS — R531 Weakness: Secondary | ICD-10-CM | POA: Diagnosis present

## 2024-09-07 DIAGNOSIS — Z01818 Encounter for other preprocedural examination: Secondary | ICD-10-CM

## 2024-09-07 DIAGNOSIS — M5440 Lumbago with sciatica, unspecified side: Secondary | ICD-10-CM | POA: Diagnosis present

## 2024-09-07 DIAGNOSIS — F419 Anxiety disorder, unspecified: Secondary | ICD-10-CM

## 2024-09-07 DIAGNOSIS — R000 Tachycardia, unspecified: Secondary | ICD-10-CM | POA: Diagnosis not present

## 2024-09-07 DIAGNOSIS — Z951 Presence of aortocoronary bypass graft: Secondary | ICD-10-CM

## 2024-09-07 HISTORY — PX: CORONARY ARTERY BYPASS GRAFT: SHX141

## 2024-09-07 HISTORY — PX: CLIPPING OF ATRIAL APPENDAGE: SHX5773

## 2024-09-07 HISTORY — PX: AORTIC VALVE REPLACEMENT: SHX41

## 2024-09-07 HISTORY — PX: INTRAOPERATIVE TRANSESOPHAGEAL ECHOCARDIOGRAM: SHX5062

## 2024-09-07 LAB — POCT I-STAT, CHEM 8
BUN: 18 mg/dL (ref 8–23)
BUN: 14 mg/dL (ref 8–23)
BUN: 17 mg/dL (ref 8–23)
BUN: 16 mg/dL (ref 8–23)
Calcium, Ion: 1.16 mmol/L (ref 1.15–1.40)
Calcium, Ion: 1.01 mmol/L — ABNORMAL LOW (ref 1.15–1.40)
Calcium, Ion: 1.14 mmol/L — ABNORMAL LOW (ref 1.15–1.40)
Calcium, Ion: 1.3 mmol/L (ref 1.15–1.40)
Chloride: 106 mmol/L (ref 98–111)
Chloride: 104 mmol/L (ref 98–111)
Chloride: 103 mmol/L (ref 98–111)
Chloride: 107 mmol/L (ref 98–111)
Creatinine, Ser: 0.9 mg/dL (ref 0.61–1.24)
Creatinine, Ser: 0.7 mg/dL (ref 0.61–1.24)
Creatinine, Ser: 0.8 mg/dL (ref 0.61–1.24)
Creatinine, Ser: 0.7 mg/dL (ref 0.61–1.24)
Glucose, Bld: 120 mg/dL — ABNORMAL HIGH (ref 70–99)
Glucose, Bld: 91 mg/dL (ref 70–99)
Glucose, Bld: 127 mg/dL — ABNORMAL HIGH (ref 70–99)
Glucose, Bld: 117 mg/dL — ABNORMAL HIGH (ref 70–99)
HCT: 37 % — ABNORMAL LOW (ref 39.0–52.0)
HCT: 28 % — ABNORMAL LOW (ref 39.0–52.0)
HCT: 30 % — ABNORMAL LOW (ref 39.0–52.0)
HCT: 33 % — ABNORMAL LOW (ref 39.0–52.0)
Hemoglobin: 12.6 g/dL — ABNORMAL LOW (ref 13.0–17.0)
Hemoglobin: 9.5 g/dL — ABNORMAL LOW (ref 13.0–17.0)
Hemoglobin: 10.2 g/dL — ABNORMAL LOW (ref 13.0–17.0)
Hemoglobin: 11.2 g/dL — ABNORMAL LOW (ref 13.0–17.0)
Potassium: 4 mmol/L (ref 3.5–5.1)
Potassium: 5 mmol/L (ref 3.5–5.1)
Potassium: 4.5 mmol/L (ref 3.5–5.1)
Potassium: 4.6 mmol/L (ref 3.5–5.1)
Sodium: 141 mmol/L (ref 135–145)
Sodium: 138 mmol/L (ref 135–145)
Sodium: 142 mmol/L (ref 135–145)
Sodium: 139 mmol/L (ref 135–145)
TCO2: 26 mmol/L (ref 22–32)
TCO2: 23 mmol/L (ref 22–32)
TCO2: 29 mmol/L (ref 22–32)
TCO2: 23 mmol/L (ref 22–32)

## 2024-09-07 LAB — MAGNESIUM: Magnesium: 3.2 mg/dL — ABNORMAL HIGH (ref 1.7–2.4)

## 2024-09-07 LAB — POCT I-STAT 7, (LYTES, BLD GAS, ICA,H+H)
Acid-base deficit: 2 mmol/L (ref 0.0–2.0)
Acid-base deficit: 3 mmol/L — ABNORMAL HIGH (ref 0.0–2.0)
Acid-base deficit: 4 mmol/L — ABNORMAL HIGH (ref 0.0–2.0)
Acid-base deficit: 1 mmol/L (ref 0.0–2.0)
Acid-base deficit: 4 mmol/L — ABNORMAL HIGH (ref 0.0–2.0)
Acid-base deficit: 3 mmol/L — ABNORMAL HIGH (ref 0.0–2.0)
Acid-base deficit: 4 mmol/L — ABNORMAL HIGH (ref 0.0–2.0)
Acid-base deficit: 2 mmol/L (ref 0.0–2.0)
Bicarbonate: 23.3 mmol/L (ref 20.0–28.0)
Bicarbonate: 21.5 mmol/L (ref 20.0–28.0)
Bicarbonate: 24.4 mmol/L (ref 20.0–28.0)
Bicarbonate: 20.2 mmol/L (ref 20.0–28.0)
Bicarbonate: 20.9 mmol/L (ref 20.0–28.0)
Bicarbonate: 24.2 mmol/L (ref 20.0–28.0)
Bicarbonate: 22 mmol/L (ref 20.0–28.0)
Bicarbonate: 23.8 mmol/L (ref 20.0–28.0)
Calcium, Ion: 1.22 mmol/L (ref 1.15–1.40)
Calcium, Ion: 0.98 mmol/L — ABNORMAL LOW (ref 1.15–1.40)
Calcium, Ion: 1.15 mmol/L (ref 1.15–1.40)
Calcium, Ion: 1.29 mmol/L (ref 1.15–1.40)
Calcium, Ion: 1.19 mmol/L (ref 1.15–1.40)
Calcium, Ion: 1.16 mmol/L (ref 1.15–1.40)
Calcium, Ion: 1.18 mmol/L (ref 1.15–1.40)
Calcium, Ion: 1.17 mmol/L (ref 1.15–1.40)
HCT: 38 % — ABNORMAL LOW (ref 39.0–52.0)
HCT: 29 % — ABNORMAL LOW (ref 39.0–52.0)
HCT: 32 % — ABNORMAL LOW (ref 39.0–52.0)
HCT: 31 % — ABNORMAL LOW (ref 39.0–52.0)
HCT: 34 % — ABNORMAL LOW (ref 39.0–52.0)
HCT: 31 % — ABNORMAL LOW (ref 39.0–52.0)
HCT: 32 % — ABNORMAL LOW (ref 39.0–52.0)
HCT: 32 % — ABNORMAL LOW (ref 39.0–52.0)
Hemoglobin: 12.9 g/dL — ABNORMAL LOW (ref 13.0–17.0)
Hemoglobin: 10.9 g/dL — ABNORMAL LOW (ref 13.0–17.0)
Hemoglobin: 9.9 g/dL — ABNORMAL LOW (ref 13.0–17.0)
Hemoglobin: 10.5 g/dL — ABNORMAL LOW (ref 13.0–17.0)
Hemoglobin: 11.6 g/dL — ABNORMAL LOW (ref 13.0–17.0)
Hemoglobin: 10.5 g/dL — ABNORMAL LOW (ref 13.0–17.0)
Hemoglobin: 10.9 g/dL — ABNORMAL LOW (ref 13.0–17.0)
Hemoglobin: 10.9 g/dL — ABNORMAL LOW (ref 13.0–17.0)
O2 Saturation: 100 %
O2 Saturation: 100 %
O2 Saturation: 100 %
O2 Saturation: 100 %
O2 Saturation: 96 %
O2 Saturation: 98 %
O2 Saturation: 90 %
O2 Saturation: 97 %
Patient temperature: 35.3
Patient temperature: 36.4
Patient temperature: 37.3
Patient temperature: 37.5
Potassium: 4 mmol/L (ref 3.5–5.1)
Potassium: 4.7 mmol/L (ref 3.5–5.1)
Potassium: 5 mmol/L (ref 3.5–5.1)
Potassium: 4.7 mmol/L (ref 3.5–5.1)
Potassium: 3.6 mmol/L (ref 3.5–5.1)
Potassium: 5 mmol/L (ref 3.5–5.1)
Potassium: 4.7 mmol/L (ref 3.5–5.1)
Potassium: 4.3 mmol/L (ref 3.5–5.1)
Sodium: 140 mmol/L (ref 135–145)
Sodium: 139 mmol/L (ref 135–145)
Sodium: 139 mmol/L (ref 135–145)
Sodium: 139 mmol/L (ref 135–145)
Sodium: 141 mmol/L (ref 135–145)
Sodium: 142 mmol/L (ref 135–145)
Sodium: 142 mmol/L (ref 135–145)
Sodium: 141 mmol/L (ref 135–145)
TCO2: 25 mmol/L (ref 22–32)
TCO2: 23 mmol/L (ref 22–32)
TCO2: 26 mmol/L (ref 22–32)
TCO2: 21 mmol/L — ABNORMAL LOW (ref 22–32)
TCO2: 22 mmol/L (ref 22–32)
TCO2: 26 mmol/L (ref 22–32)
TCO2: 23 mmol/L (ref 22–32)
TCO2: 25 mmol/L (ref 22–32)
pCO2 arterial: 42.7 mmHg (ref 32–48)
pCO2 arterial: 38.3 mmHg (ref 32–48)
pCO2 arterial: 54.3 mmHg — ABNORMAL HIGH (ref 32–48)
pCO2 arterial: 21.9 mmHg — ABNORMAL LOW (ref 32–48)
pCO2 arterial: 33.4 mmHg (ref 32–48)
pCO2 arterial: 48.7 mmHg — ABNORMAL HIGH (ref 32–48)
pCO2 arterial: 45 mmHg (ref 32–48)
pCO2 arterial: 46.5 mmHg (ref 32–48)
pH, Arterial: 7.345 — ABNORMAL LOW (ref 7.35–7.45)
pH, Arterial: 7.262 — ABNORMAL LOW (ref 7.35–7.45)
pH, Arterial: 7.357 (ref 7.35–7.45)
pH, Arterial: 7.574 — ABNORMAL HIGH (ref 7.35–7.45)
pH, Arterial: 7.397 (ref 7.35–7.45)
pH, Arterial: 7.302 — ABNORMAL LOW (ref 7.35–7.45)
pH, Arterial: 7.298 — ABNORMAL LOW (ref 7.35–7.45)
pH, Arterial: 7.319 — ABNORMAL LOW (ref 7.35–7.45)
pO2, Arterial: 519 mmHg — ABNORMAL HIGH (ref 83–108)
pO2, Arterial: 326 mmHg — ABNORMAL HIGH (ref 83–108)
pO2, Arterial: 405 mmHg — ABNORMAL HIGH (ref 83–108)
pO2, Arterial: 307 mmHg — ABNORMAL HIGH (ref 83–108)
pO2, Arterial: 72 mmHg — ABNORMAL LOW (ref 83–108)
pO2, Arterial: 112 mmHg — ABNORMAL HIGH (ref 83–108)
pO2, Arterial: 67 mmHg — ABNORMAL LOW (ref 83–108)
pO2, Arterial: 101 mmHg (ref 83–108)

## 2024-09-07 LAB — BASIC METABOLIC PANEL WITH GFR
Anion gap: 8 (ref 5–15)
BUN: 16 mg/dL (ref 8–23)
CO2: 25 mmol/L (ref 22–32)
Calcium: 7.7 mg/dL — ABNORMAL LOW (ref 8.9–10.3)
Chloride: 108 mmol/L (ref 98–111)
Creatinine, Ser: 0.84 mg/dL (ref 0.61–1.24)
GFR, Estimated: >60 mL/min
Glucose, Bld: 122 mg/dL — ABNORMAL HIGH (ref 70–99)
Potassium: 4.5 mmol/L (ref 3.5–5.1)
Sodium: 141 mmol/L (ref 135–145)

## 2024-09-07 LAB — CBC
HCT: 36 % — ABNORMAL LOW (ref 39.0–52.0)
HCT: 34.2 % — ABNORMAL LOW (ref 39.0–52.0)
Hemoglobin: 11.9 g/dL — ABNORMAL LOW (ref 13.0–17.0)
Hemoglobin: 11.4 g/dL — ABNORMAL LOW (ref 13.0–17.0)
MCH: 28.5 pg (ref 26.0–34.0)
MCH: 28.6 pg (ref 26.0–34.0)
MCHC: 33.1 g/dL (ref 30.0–36.0)
MCHC: 33.3 g/dL (ref 30.0–36.0)
MCV: 86.3 fL (ref 80.0–100.0)
MCV: 85.7 fL (ref 80.0–100.0)
Platelets: 155 K/uL (ref 150–400)
Platelets: 169 K/uL (ref 150–400)
RBC: 4.17 MIL/uL — ABNORMAL LOW (ref 4.22–5.81)
RBC: 3.99 MIL/uL — ABNORMAL LOW (ref 4.22–5.81)
RDW: 12.7 % (ref 11.5–15.5)
RDW: 12.9 % (ref 11.5–15.5)
WBC: 9.4 K/uL (ref 4.0–10.5)
WBC: 10.9 K/uL — ABNORMAL HIGH (ref 4.0–10.5)
nRBC: 0 % (ref 0.0–0.2)
nRBC: 0 % (ref 0.0–0.2)

## 2024-09-07 LAB — PLATELET COUNT: Platelets: 188 K/uL (ref 150–400)

## 2024-09-07 LAB — GLUCOSE, CAPILLARY
Glucose-Capillary: 119 mg/dL — ABNORMAL HIGH (ref 70–99)
Glucose-Capillary: 99 mg/dL (ref 70–99)
Glucose-Capillary: 133 mg/dL — ABNORMAL HIGH (ref 70–99)
Glucose-Capillary: 102 mg/dL — ABNORMAL HIGH (ref 70–99)
Glucose-Capillary: 121 mg/dL — ABNORMAL HIGH (ref 70–99)
Glucose-Capillary: 122 mg/dL — ABNORMAL HIGH (ref 70–99)
Glucose-Capillary: 146 mg/dL — ABNORMAL HIGH (ref 70–99)
Glucose-Capillary: 122 mg/dL — ABNORMAL HIGH (ref 70–99)

## 2024-09-07 LAB — PROTIME-INR
INR: 1.5 — ABNORMAL HIGH (ref 0.8–1.2)
Prothrombin Time: 18.5 s — ABNORMAL HIGH (ref 11.4–15.2)

## 2024-09-07 LAB — ECHO INTRAOPERATIVE TEE
Height: 69 in
Weight: 2803.19 [oz_av]

## 2024-09-07 LAB — PREPARE RBC (CROSSMATCH)

## 2024-09-07 LAB — APTT: aPTT: 42 s — ABNORMAL HIGH (ref 24–36)

## 2024-09-07 LAB — HEMOGLOBIN AND HEMATOCRIT, BLOOD
HCT: 32.7 % — ABNORMAL LOW (ref 39.0–52.0)
Hemoglobin: 10.9 g/dL — ABNORMAL LOW (ref 13.0–17.0)

## 2024-09-07 MED ORDER — ALBUMIN HUMAN 5 % IV SOLN: 250.0000 mL | INTRAVENOUS | Status: DC | PRN

## 2024-09-07 MED ORDER — METOCLOPRAMIDE HCL 5 MG/ML IJ SOLN
10.0000 mg | Freq: Four times a day (QID) | INTRAMUSCULAR | Status: AC
  Administered 2024-09-07 – 2024-09-08 (×6): 10 mg via INTRAVENOUS
  Filled 2024-09-07 (×6): qty 2

## 2024-09-07 MED ORDER — MIDAZOLAM HCL (PF) 10 MG/2ML IJ SOLN
INTRAMUSCULAR | Status: AC
  Filled 2024-09-07: qty 2

## 2024-09-07 MED ORDER — INSULIN REGULAR(HUMAN) IN NACL 100-0.9 UT/100ML-% IV SOLN: INTRAVENOUS | Status: DC

## 2024-09-07 MED ORDER — SODIUM CHLORIDE 0.9 % IV SOLN: INTRAVENOUS | Status: DC

## 2024-09-07 MED ORDER — CHLORHEXIDINE GLUCONATE 0.12 % MT SOLN
15.0000 mL | Freq: Once | OROMUCOSAL | Status: AC
  Administered 2024-09-07: 15 mL via OROMUCOSAL

## 2024-09-07 MED ORDER — ESCITALOPRAM OXALATE 10 MG PO TABS
10.0000 mg | ORAL_TABLET | Freq: Every day | ORAL | Status: DC
  Administered 2024-09-08 – 2024-09-12 (×5): 10 mg via ORAL
  Filled 2024-09-07 (×5): qty 1

## 2024-09-07 MED ORDER — FENTANYL CITRATE (PF) 250 MCG/5ML IJ SOLN
INTRAMUSCULAR | Status: AC
  Filled 2024-09-07: qty 5

## 2024-09-07 MED ORDER — VANCOMYCIN HCL IN DEXTROSE 1-5 GM/200ML-% IV SOLN
1000.0000 mg | Freq: Once | INTRAVENOUS | Status: AC
  Administered 2024-09-07: 1000 mg via INTRAVENOUS
  Filled 2024-09-07: qty 200

## 2024-09-07 MED ORDER — CHLORHEXIDINE GLUCONATE 4 % EX SOLN: 30.0000 mL | CUTANEOUS | Status: DC

## 2024-09-07 MED ORDER — ALBUMIN HUMAN 5 % IV SOLN: INTRAVENOUS | Status: DC | PRN

## 2024-09-07 MED ORDER — ACETAMINOPHEN 325 MG PO TABS
650.0000 mg | ORAL_TABLET | Freq: Once | ORAL | Status: AC
  Administered 2024-09-07: 650 mg via ORAL
  Filled 2024-09-07: qty 2

## 2024-09-07 MED ORDER — ACETAMINOPHEN 160 MG/5ML PO SOLN: 650.0000 mg | Freq: Once | ORAL | Status: DC

## 2024-09-07 MED ORDER — ONDANSETRON HCL 4 MG/2ML IJ SOLN
INTRAMUSCULAR | Status: AC
  Filled 2024-09-07: qty 2

## 2024-09-07 MED ORDER — ALBUMIN HUMAN 5 % IV SOLN: 250.0000 mL | Freq: Once | INTRAVENOUS | Status: DC

## 2024-09-07 MED ORDER — PROPOFOL 500 MG/50ML IV EMUL
INTRAVENOUS | Status: DC | PRN
  Administered 2024-09-07: 30 ug/kg/min via INTRAVENOUS

## 2024-09-07 MED ORDER — PHENYLEPHRINE HCL-NACL 20-0.9 MG/250ML-% IV SOLN
0.0000 ug/min | INTRAVENOUS | Status: DC
  Filled 2024-09-07: qty 250

## 2024-09-07 MED ORDER — LACTATED RINGERS IV SOLN: INTRAVENOUS | Status: AC

## 2024-09-07 MED ORDER — MAGNESIUM SULFATE 4 GM/100ML IV SOLN
4.0000 g | Freq: Once | INTRAVENOUS | Status: AC
  Administered 2024-09-07: 4 g via INTRAVENOUS
  Filled 2024-09-07: qty 100

## 2024-09-07 MED ORDER — DEXMEDETOMIDINE HCL IN NACL 400 MCG/100ML IV SOLN: 0.0000 ug/kg/h | INTRAVENOUS | Status: DC

## 2024-09-07 MED ORDER — PROPOFOL 10 MG/ML IV BOLUS
INTRAVENOUS | Status: DC | PRN
  Administered 2024-09-07: 20 mg via INTRAVENOUS
  Administered 2024-09-07: 40 mg via INTRAVENOUS
  Administered 2024-09-07: 20 mg via INTRAVENOUS
  Administered 2024-09-07: 70 mg via INTRAVENOUS
  Administered 2024-09-07: 40 mg via INTRAVENOUS

## 2024-09-07 MED ORDER — CHLORHEXIDINE GLUCONATE 0.12 % MT SOLN
15.0000 mL | Freq: Once | OROMUCOSAL | Status: DC
  Filled 2024-09-07: qty 15

## 2024-09-07 MED ORDER — ALBUMIN HUMAN 5 % IV SOLN
INTRAVENOUS | Status: AC
  Filled 2024-09-07: qty 250

## 2024-09-07 MED ORDER — PROPOFOL 10 MG/ML IV BOLUS
INTRAVENOUS | Status: AC
  Filled 2024-09-07: qty 20

## 2024-09-07 MED ORDER — PROTAMINE SULFATE 10 MG/ML IV SOLN
INTRAVENOUS | Status: AC
  Filled 2024-09-07: qty 25

## 2024-09-07 MED ORDER — NITROGLYCERIN IN D5W 200-5 MCG/ML-% IV SOLN
0.0000 ug/min | INTRAVENOUS | Status: DC
  Administered 2024-09-07: 5 ug/min via INTRAVENOUS

## 2024-09-07 MED ORDER — ~~LOC~~ CARDIAC SURGERY, PATIENT & FAMILY EDUCATION
Freq: Once | Status: DC
  Filled 2024-09-07: qty 1

## 2024-09-07 MED ORDER — LACTATED RINGERS IV SOLN: INTRAVENOUS | Status: DC

## 2024-09-07 MED ORDER — PLASMA-LYTE A IV SOLN
INTRAVENOUS | Status: DC | PRN
  Administered 2024-09-07: 500 mL via INTRAVASCULAR

## 2024-09-07 MED ORDER — PANTOPRAZOLE SODIUM 40 MG PO TBEC
40.0000 mg | DELAYED_RELEASE_TABLET | Freq: Every day | ORAL | Status: DC
  Administered 2024-09-09 – 2024-09-12 (×4): 40 mg via ORAL
  Filled 2024-09-07 (×4): qty 1

## 2024-09-07 MED ORDER — MORPHINE SULFATE (PF) 2 MG/ML IV SOLN: 1.0000 mg | INTRAVENOUS | Status: DC | PRN

## 2024-09-07 MED ORDER — 0.9 % SODIUM CHLORIDE (POUR BTL) OPTIME
TOPICAL | Status: DC | PRN
  Administered 2024-09-07: 5000 mL

## 2024-09-07 MED ORDER — PHENYLEPHRINE 80 MCG/ML (10ML) SYRINGE FOR IV PUSH (FOR BLOOD PRESSURE SUPPORT)
PREFILLED_SYRINGE | INTRAVENOUS | Status: AC
  Filled 2024-09-07: qty 10

## 2024-09-07 MED ORDER — SODIUM CHLORIDE 0.9 % IV SOLN: 250.0000 mL | INTRAVENOUS | Status: AC

## 2024-09-07 MED ORDER — ROSUVASTATIN CALCIUM 20 MG PO TABS
20.0000 mg | ORAL_TABLET | Freq: Every day | ORAL | Status: DC
  Administered 2024-09-08 – 2024-09-11 (×4): 20 mg via ORAL
  Filled 2024-09-07 (×4): qty 1

## 2024-09-07 MED ORDER — SUGAMMADEX SODIUM 200 MG/2ML IV SOLN
INTRAVENOUS | Status: DC | PRN
  Administered 2024-09-07: 200 mg via INTRAVENOUS

## 2024-09-07 MED ORDER — MIDAZOLAM HCL 5 MG/5ML IJ SOLN
INTRAMUSCULAR | Status: DC | PRN
  Administered 2024-09-07: 1 mg via INTRAVENOUS
  Administered 2024-09-07: 2 mg via INTRAVENOUS
  Administered 2024-09-07: 3 mg via INTRAVENOUS
  Administered 2024-09-07: 1 mg via INTRAVENOUS

## 2024-09-07 MED ORDER — MIDAZOLAM HCL (PF) 2 MG/2ML IJ SOLN: 2.0000 mg | INTRAMUSCULAR | Status: DC | PRN

## 2024-09-07 MED ORDER — SODIUM CHLORIDE 0.9% FLUSH
3.0000 mL | Freq: Two times a day (BID) | INTRAVENOUS | Status: DC
  Administered 2024-09-08 – 2024-09-10 (×5): 3 mL via INTRAVENOUS

## 2024-09-07 MED ORDER — CHLORHEXIDINE GLUCONATE CLOTH 2 % EX PADS
6.0000 | MEDICATED_PAD | Freq: Every day | CUTANEOUS | Status: DC
  Administered 2024-09-07 – 2024-09-10 (×4): 6 via TOPICAL

## 2024-09-07 MED ORDER — THROMBIN (RECOMBINANT) 20000 UNITS EX SOLR
CUTANEOUS | Status: AC
  Filled 2024-09-07: qty 20000

## 2024-09-07 MED ORDER — ROCURONIUM BROMIDE 10 MG/ML (PF) SYRINGE
PREFILLED_SYRINGE | INTRAVENOUS | Status: DC | PRN
  Administered 2024-09-07: 20 mg via INTRAVENOUS
  Administered 2024-09-07: 100 mg via INTRAVENOUS
  Administered 2024-09-07: 50 mg via INTRAVENOUS
  Administered 2024-09-07: 20 mg via INTRAVENOUS

## 2024-09-07 MED ORDER — ONDANSETRON HCL 4 MG/2ML IJ SOLN
INTRAMUSCULAR | Status: DC | PRN
  Administered 2024-09-07: 4 mg via INTRAVENOUS

## 2024-09-07 MED ORDER — HEPARIN SODIUM (PORCINE) 1000 UNIT/ML IJ SOLN
INTRAMUSCULAR | Status: AC
  Filled 2024-09-07: qty 1

## 2024-09-07 MED ORDER — PROTAMINE SULFATE 10 MG/ML IV SOLN
INTRAVENOUS | Status: AC
  Filled 2024-09-07: qty 5

## 2024-09-07 MED ORDER — POTASSIUM CHLORIDE 10 MEQ/50ML IV SOLN
10.0000 meq | INTRAVENOUS | Status: AC
  Administered 2024-09-07 (×3): 10 meq via INTRAVENOUS

## 2024-09-07 MED ORDER — DEXTROSE 50 % IV SOLN: 0.0000 mL | INTRAVENOUS | Status: DC | PRN

## 2024-09-07 MED ORDER — ROCURONIUM BROMIDE 10 MG/ML (PF) SYRINGE
PREFILLED_SYRINGE | INTRAVENOUS | Status: AC
  Filled 2024-09-07: qty 20

## 2024-09-07 MED ORDER — SODIUM BICARBONATE 8.4 % IV SOLN
50.0000 meq | Freq: Once | INTRAVENOUS | Status: AC
  Administered 2024-09-07: 50 meq via INTRAVENOUS

## 2024-09-07 MED ORDER — ASPIRIN 81 MG PO CHEW
324.0000 mg | CHEWABLE_TABLET | Freq: Once | ORAL | Status: AC
  Administered 2024-09-07: 324 mg via ORAL
  Filled 2024-09-07: qty 4

## 2024-09-07 MED ORDER — METOPROLOL TARTRATE 25 MG/10 ML ORAL SUSPENSION: 12.5000 mg | Freq: Two times a day (BID) | ORAL | Status: DC

## 2024-09-07 MED ORDER — ONDANSETRON HCL 4 MG/2ML IJ SOLN: 4.0000 mg | Freq: Four times a day (QID) | INTRAMUSCULAR | Status: DC | PRN

## 2024-09-07 MED ORDER — SODIUM CHLORIDE 0.45 % IV SOLN: INTRAVENOUS | Status: DC | PRN

## 2024-09-07 MED ORDER — LACTATED RINGERS IV SOLN: INTRAVENOUS | Status: DC | PRN

## 2024-09-07 MED ORDER — CEFAZOLIN SODIUM-DEXTROSE 2-4 GM/100ML-% IV SOLN
2.0000 g | Freq: Three times a day (TID) | INTRAVENOUS | Status: AC
  Administered 2024-09-07 – 2024-09-09 (×6): 2 g via INTRAVENOUS
  Filled 2024-09-07 (×6): qty 100

## 2024-09-07 MED ORDER — METOPROLOL TARTRATE 5 MG/5ML IV SOLN: 2.5000 mg | INTRAVENOUS | Status: DC | PRN

## 2024-09-07 MED ORDER — THROMBIN 20000 UNITS EX SOLR: OROMUCOSAL | Status: DC | PRN

## 2024-09-07 MED ORDER — ORAL CARE MOUTH RINSE
15.0000 mL | OROMUCOSAL | Status: DC | PRN
  Administered 2024-09-07: 15 mL via OROMUCOSAL

## 2024-09-07 MED ORDER — HEMOSTATIC AGENTS (NO CHARGE) OPTIME
TOPICAL | Status: DC | PRN
  Administered 2024-09-07: 1 via TOPICAL

## 2024-09-07 MED ORDER — ASPIRIN 81 MG PO CHEW: 324.0000 mg | CHEWABLE_TABLET | Freq: Every day | ORAL | Status: DC

## 2024-09-07 MED ORDER — SODIUM CHLORIDE 0.9% IV SOLUTION: Freq: Once | INTRAVENOUS | Status: DC

## 2024-09-07 MED ORDER — FENTANYL CITRATE (PF) 100 MCG/2ML IJ SOLN
INTRAMUSCULAR | Status: DC | PRN
  Administered 2024-09-07 (×4): 100 ug via INTRAVENOUS
  Administered 2024-09-07: 200 ug via INTRAVENOUS
  Administered 2024-09-07 (×4): 100 ug via INTRAVENOUS
  Administered 2024-09-07: 50 ug via INTRAVENOUS
  Administered 2024-09-07: 100 ug via INTRAVENOUS

## 2024-09-07 MED ORDER — METOPROLOL TARTRATE 12.5 MG HALF TABLET: 12.5000 mg | ORAL_TABLET | Freq: Two times a day (BID) | ORAL | Status: DC

## 2024-09-07 MED ORDER — HEPARIN SODIUM (PORCINE) 1000 UNIT/ML IJ SOLN
INTRAMUSCULAR | Status: DC | PRN
  Administered 2024-09-07: 28000 [IU] via INTRAVENOUS

## 2024-09-07 MED ORDER — ORAL CARE MOUTH RINSE
15.0000 mL | OROMUCOSAL | Status: DC
  Administered 2024-09-07 (×2): 15 mL via OROMUCOSAL

## 2024-09-07 MED ORDER — PROPOFOL 1000 MG/100ML IV EMUL: 0.0000 ug/kg/min | INTRAVENOUS | Status: DC

## 2024-09-07 MED ORDER — BISACODYL 10 MG RE SUPP: 10.0000 mg | Freq: Every day | RECTAL | Status: DC

## 2024-09-07 MED ORDER — BISACODYL 5 MG PO TBEC
10.0000 mg | DELAYED_RELEASE_TABLET | Freq: Every day | ORAL | Status: DC
  Administered 2024-09-08 – 2024-09-10 (×3): 10 mg via ORAL
  Filled 2024-09-07 (×3): qty 2

## 2024-09-07 MED ORDER — ACETAMINOPHEN 160 MG/5ML PO SOLN: 1000.0000 mg | Freq: Four times a day (QID) | ORAL | Status: DC

## 2024-09-07 MED ORDER — ACETAMINOPHEN 500 MG PO TABS
1000.0000 mg | ORAL_TABLET | Freq: Once | ORAL | Status: AC
  Administered 2024-09-07: 1000 mg via ORAL
  Filled 2024-09-07: qty 2

## 2024-09-07 MED ORDER — SODIUM CHLORIDE 0.9% FLUSH: 3.0000 mL | INTRAVENOUS | Status: DC | PRN

## 2024-09-07 MED ORDER — ORAL CARE MOUTH RINSE: 15.0000 mL | OROMUCOSAL | Status: DC | PRN

## 2024-09-07 MED ORDER — PANTOPRAZOLE SODIUM 40 MG IV SOLR
40.0000 mg | Freq: Every day | INTRAVENOUS | Status: AC
  Administered 2024-09-07 – 2024-09-08 (×2): 40 mg via INTRAVENOUS
  Filled 2024-09-07 (×2): qty 10

## 2024-09-07 MED ORDER — ASPIRIN 325 MG PO TBEC
325.0000 mg | DELAYED_RELEASE_TABLET | Freq: Every day | ORAL | Status: DC
  Administered 2024-09-08 – 2024-09-12 (×5): 325 mg via ORAL
  Filled 2024-09-07 (×5): qty 1

## 2024-09-07 MED ORDER — ALBUMIN HUMAN 5 % IV SOLN
250.0000 mL | INTRAVENOUS | Status: AC | PRN
  Administered 2024-09-07 (×5): 12.5 g via INTRAVENOUS
  Filled 2024-09-07 (×3): qty 250

## 2024-09-07 MED ORDER — CHLORHEXIDINE GLUCONATE 0.12 % MT SOLN
15.0000 mL | OROMUCOSAL | Status: AC
  Administered 2024-09-07: 15 mL via OROMUCOSAL
  Filled 2024-09-07: qty 15

## 2024-09-07 MED ORDER — ORAL CARE MOUTH RINSE: 15.0000 mL | Freq: Once | OROMUCOSAL | Status: AC

## 2024-09-07 MED ORDER — ACETAMINOPHEN 500 MG PO TABS
1000.0000 mg | ORAL_TABLET | Freq: Four times a day (QID) | ORAL | Status: DC
  Administered 2024-09-07 – 2024-09-10 (×12): 1000 mg via ORAL
  Filled 2024-09-07 (×12): qty 2

## 2024-09-07 MED ORDER — PHENYLEPHRINE 80 MCG/ML (10ML) SYRINGE FOR IV PUSH (FOR BLOOD PRESSURE SUPPORT)
PREFILLED_SYRINGE | INTRAVENOUS | Status: DC | PRN
  Administered 2024-09-07 (×3): 80 ug via INTRAVENOUS
  Administered 2024-09-07: 160 ug via INTRAVENOUS

## 2024-09-07 MED ORDER — PROTAMINE SULFATE 10 MG/ML IV SOLN
INTRAVENOUS | Status: DC | PRN
  Administered 2024-09-07: 50 mg via INTRAVENOUS
  Administered 2024-09-07: 20 mg via INTRAVENOUS
  Administered 2024-09-07: 280 mg via INTRAVENOUS

## 2024-09-07 MED ORDER — METOPROLOL TARTRATE 12.5 MG HALF TABLET
12.5000 mg | ORAL_TABLET | Freq: Once | ORAL | Status: AC
  Administered 2024-09-07: 12.5 mg via ORAL
  Filled 2024-09-07: qty 1

## 2024-09-07 MED ORDER — DOCUSATE SODIUM 100 MG PO CAPS
200.0000 mg | ORAL_CAPSULE | Freq: Every day | ORAL | Status: DC
  Administered 2024-09-08 – 2024-09-10 (×3): 200 mg via ORAL
  Filled 2024-09-07 (×3): qty 2

## 2024-09-07 MED ORDER — OXYCODONE HCL 5 MG PO TABS
5.0000 mg | ORAL_TABLET | ORAL | Status: DC | PRN
  Administered 2024-09-07 – 2024-09-08 (×2): 5 mg via ORAL
  Administered 2024-09-08 (×4): 10 mg via ORAL
  Administered 2024-09-09 (×2): 5 mg via ORAL
  Administered 2024-09-09 – 2024-09-11 (×7): 10 mg via ORAL
  Filled 2024-09-07: qty 2
  Filled 2024-09-07: qty 1
  Filled 2024-09-07 (×4): qty 2
  Filled 2024-09-07: qty 1
  Filled 2024-09-07: qty 2
  Filled 2024-09-07: qty 1
  Filled 2024-09-07 (×3): qty 2
  Filled 2024-09-07: qty 1
  Filled 2024-09-07 (×2): qty 2

## 2024-09-07 MED ORDER — TRAMADOL HCL 50 MG PO TABS
50.0000 mg | ORAL_TABLET | ORAL | Status: DC | PRN
  Administered 2024-09-08 – 2024-09-10 (×7): 50 mg via ORAL
  Filled 2024-09-07 (×7): qty 1

## 2024-09-07 NOTE — Interval H&P Note (Signed)
 History and Physical Interval Note:  09/07/2024 6:38 AM  Cody Mcconnell  has presented today for surgery, with the diagnosis of CAD.  The various methods of treatment have been discussed with the patient and family. After consideration of risks, benefits and other options for treatment, the patient has consented to  Procedures: CORONARY ARTERY BYPASS GRAFTING (CABG) (N/A) REPLACEMENT, AORTIC VALVE, OPEN (N/A) CLIPPING, LEFT ATRIAL APPENDAGE (N/A) ECHOCARDIOGRAM, TRANSESOPHAGEAL, INTRAOPERATIVE (N/A) as a surgical intervention.  The patient's history has been reviewed, patient examined, no change in status, stable for surgery.  I have reviewed the patient's chart and labs.  Questions were answered to the patient's satisfaction.     Oluwasemilore Bahl K Melvin Whiteford

## 2024-09-07 NOTE — Anesthesia Procedure Notes (Signed)
 Central Venous Catheter Insertion Performed by: Epifanio Fallow, MD, anesthesiologist Start/End12/30/2025 6:40 AM, 09/07/2024 7:00 AM Patient location: Pre-op. Preanesthetic checklist: patient identified, IV checked, site marked, risks and benefits discussed, surgical consent, monitors and equipment checked, pre-op evaluation, timeout performed and anesthesia consent Position: Trendelenburg Lidocaine  1% used for infiltration and patient sedated Hand hygiene performed , maximum sterile barriers used  and Seldinger technique used Catheter size: 9 Fr Total catheter length 10. Central line was placed.MAC introducer Procedure performed using ultrasound to evaluate access site. Ultrasound Notes:relevant anatomy identified, ultrasound used to visualize needle entry, vessel patent under ultrasound and image(s) printed for medical record. Attempts: 1 Following insertion, line sutured, dressing applied and Biopatch. Post procedure assessment: blood return through all ports, free fluid flow and no air  Patient tolerated the procedure well with no immediate complications.

## 2024-09-07 NOTE — Anesthesia Procedure Notes (Signed)
 Procedure Name: Intubation Date/Time: 09/07/2024 7:34 AM  Performed by: Lettie Derrek Dacosta, CRNAPre-anesthesia Checklist: Patient identified, Patient being monitored, Timeout performed, Emergency Drugs available and Suction available Patient Re-evaluated:Patient Re-evaluated prior to induction Oxygen Delivery Method: Circle System Utilized Preoxygenation: Pre-oxygenation with 100% oxygen Induction Type: IV induction Ventilation: Mask ventilation without difficulty Laryngoscope Size: Mac and 4 Grade View: Grade II Tube type: Oral Tube size: 8.0 mm Number of attempts: 1 Airway Equipment and Method: Stylet Placement Confirmation: ETT inserted through vocal cords under direct vision, positive ETCO2 and breath sounds checked- equal and bilateral Secured at: 23 cm Tube secured with: Tape Dental Injury: Teeth and Oropharynx as per pre-operative assessment

## 2024-09-07 NOTE — Progress Notes (Signed)
" °  TCTS Evening Rounds:   Hemodynamically stable  CI = 2.5  Extubated and alert. Dangling at bedside  Urine output good  CT output low  CBC    Component Value Date/Time   WBC 9.4 09/07/2024 1257   RBC 4.17 (L) 09/07/2024 1257   HGB 10.9 (L) 09/07/2024 1701   HGB 14.7 07/20/2024 1627   HCT 32.0 (L) 09/07/2024 1701   HCT 44.3 07/20/2024 1627   PLT 155 09/07/2024 1257   PLT 256 07/20/2024 1627   MCV 86.3 09/07/2024 1257   MCV 87 07/20/2024 1627   MCH 28.5 09/07/2024 1257   MCHC 33.1 09/07/2024 1257   RDW 12.7 09/07/2024 1257   RDW 13.3 07/20/2024 1627   LYMPHSABS 2.0 04/06/2023 1725   MONOABS 0.3 04/06/2023 1725   EOSABS 0.2 04/06/2023 1725   BASOSABS 0.0 04/06/2023 1725     BMET    Component Value Date/Time   NA 142 09/07/2024 1701   NA 142 07/20/2024 1627   K 4.7 09/07/2024 1701   CL 107 09/07/2024 1134   CO2 26 09/03/2024 1140   GLUCOSE 117 (H) 09/07/2024 1134   BUN 16 09/07/2024 1134   BUN 22 07/20/2024 1627   CREATININE 0.70 09/07/2024 1134   CREATININE 1.21 02/25/2022 1105   CALCIUM  9.0 09/03/2024 1140   EGFR 86 07/20/2024 1627   GFRNONAA >60 09/03/2024 1140   GFRNONAA >60 02/25/2022 1105     A/P:  Stable postop course. Continue current plans  "

## 2024-09-07 NOTE — Discharge Summary (Signed)
 Physician Discharge Summary  Patient ID: KEYLEN ECKENRODE MRN: [id number] DOB/AGE: March 21, 1946 78 y.o.  Admit date: 09/07/2024 Discharge date: 09/12/2024  Admission Diagnoses: Severe aortic stenosis Coronary artery disease Paroxysmal atrial fibrillation Hyperlipidemia Hypertension Prediabetes Spinal stenosis   Discharge Diagnoses:  Severe aortic stenosis Coronary artery disease S/P coronary bypass grafting x 1 S/P aortic valve replacement with a bioprosthetic valve S/P application of left atrial appendage clip Paroxysmal atrial fibrillation Hyperlipidemia Hypertension Prediabetes Spinal stenosis Post-operative atrial fibrillation  Discharged Condition: stable  PCP is Corlis Pagan, NP Referring Provider is Wonda Sharper, MD  History of Present Illness: The patient is a 78 year old gentleman with a history of hyperlipidemia, hypertension, paroxysmal atrial fibrillation, and moderate aortic stenosis followed by echocardiogram.  A 2D echocardiogram on 04/02/2023 showed a severely thickened and calcified aortic valve with a mean gradient of 31 mmHg.  Left ventricular ejection fraction was 55 to 60%.  Since then he had 2 different episodes of exertional chest discomfort and shortness of breath associated with activity.  The first occurred when he was in [place] walking up hills.  He developed a severe squeezing sensation in his chest relieved with rest.  He had a second episode when he was at the [facility] attending a pain rehabilitation clinic and had to do a lot of walking.  This was a similar severe squeezing sensation in the chest.  Since then he has had intermittent episodes of this squeezing sensation in his chest as well as exertional shortness of breath and fatigue.  If he is up doing activity in the morning for a few hours then he needs to take a nap for a few hours.  He still swims and has had no exertional chest discomfort or shortness of breath with this.  His denies  orthopnea and peripheral edema.  He has had dizziness but no syncope.  He had a follow-up echocardiogram on 07/15/2024 showing an increase in the mean gradient to 35 mmHg with a valve area by VTI of 0.62 cm and dimensionless index of 0.22.  Stroke-volume index was low at 31.  Left ventricular ejection fraction was 65 to 70%.  He subsequently underwent cardiac catheterization on 08/04/2024 showing an ostial LAD stenosis estimated 80%.  There was also proximal to mid 50% LAD stenosis.  RFR in the LAD was 0.75.  The distal left circumflex had about 50% stenosis.  The RCA had diffuse plaquing but no significant stenosis.   He lives with his wife.  He is retired.  He has chronic disability due to multiple prior back surgeries causing back and lower extremity pain and weakness.   I agree that the best treatment for him is aortic valve replacement using a bioprosthetic valve and CABG to the LAD using a LIMA graft.  He has a history of paroxysmal atrial fibrillation but does not want to be on anticoagulation and has had a low burden of atrial fibrillation.  I think it would be worthwhile clipping his left atrial appendage to decrease his stroke risk.  I discussed the operative procedure with the patient and his wife including alternatives, benefits and risks; including but not limited to bleeding, blood transfusion, infection, stroke, myocardial infarction, graft failure, heart block requiring a permanent pacemaker, organ dysfunction, and death.  Reiner A Brigance understands and agrees to proceed.     Plan coronary artery bypass graft surgery x 1 with a LIMA to the LAD, aortic valve replacement using a bioprosthetic valve, and clipping of left atrial appendage.  Hospital  Course: Mr. Rehfeld was admitted for elective surgery on 09/07/24 and taken to the OR where single-vessel coronary bypass was accomplished with the left internal mammary artery grafted to the left anterior descending coronary artery. The aortic  valve was replaced with a 23mm [employer] Inspiris bioprosthetic valve, and a clip was applied to the left atrial appendage. Following the procedure, he separated from cardiopulmonary bypass without difficulty requiring no inotropic support and was transferred to the surgical ICU in stable condition.  He was extubated the evening of surgery without difficulty.  He required support with Neo-synephrine which was weaned as hemodynamics allowed.  The patient was started on lasix  to help facilitate diuresis.  His chest tubes, swan ganz catheter, and arterial lines were removed without difficulty.  He has chronic back pain and was treated with Lidocaine  patch and Toradol .  He developed Atrial Fibrillation with RVR overnight.  He was treated with Amiodarone  bolus and drip protocol with prompt conversion back to NSR. By post-op day 3, he was ready for transfer to 4E Progressive Care. He regained independence with mobility and transfers. Diet was advanced and well tolerated.  He remained in SR until the early morning on post-op day 5 when h reverted abck to a-fib with VR ~95-100.  He tolerated this well.  He was given an additional bolus of IV amiodarone  in addition to the oral amiodarone  he was already receiving. Serum magnesium  of 1.9 was supplemented with magnesium  sulfate 2gm IV.  He was started on Xarelto  for the recurrent atrial fibrillation. He remained in atrial fibrillation until discharge but had controlled rates and was tolerating this without any difficulty.   Consults: None  Significant Diagnostic Studies:   Angiography:     Ost LAD lesion is 80% stenosed.   Prox LAD to Mid LAD lesion is 50% stenosed.   Dist Cx lesion is 50% stenosed.   1.  Known severe aortic stenosis with bulky aortic valve calcification demonstrated on plain fluoroscopy 2.  Severe 80% ostial LAD stenosis and moderate 50% proximal LAD stenosis with a diffusely calcified LAD, confirmed hemodynamic significance with an RFR  of 0.75. 3.  Patent left circumflex with mild diffuse plaquing and no significant stenosis 4.  Patent RCA with mild diffuse plaquing and no significant stenosis   Recommendations: Cardiac surgical referral for consideration of single vessel CABG/AVR  Echocardiogram:  Patient Name:   GARDNER SERVANTES Date of Exam: 07/15/2024  Medical Rec #:  [id number]          Height:       69.5 in  Accession #:    [id number]         Weight:       170.8 lb  Date of Birth:  11/26/1945          BSA:          1.942 m  Patient Age:    78 years           BP:           108/58 mmHg  Patient Gender: M                  HR:           60 bpm.  Exam Location:  [facility]   Procedure: Cardiac Doppler, Limited Echo and Limited Color Doppler (Both             Spectral and Color Flow Doppler were utilized during  procedure).   Indications:  I35.0 AS    History:        Patient has prior history of Echocardiogram examinations.  AS,                 Arrythmias:Atrial Fibrillation; Risk Factors:Dyslipidemia.    Sonographer:    Elsie Bohr RDCS  Referring Phys: [phone] MICHAEL COOPER   IMPRESSIONS    1. Left ventricular ejection fraction, by estimation, is 65 to 70%. The  left ventricle has normal function. The left ventricle has no regional  wall motion abnormalities. There is moderate concentric left ventricular  hypertrophy. Left ventricular  diastolic parameters are consistent with Grade II diastolic dysfunction  (pseudonormalization).   2. Right ventricular systolic function is normal. The right ventricular  size is normal.   3. The mitral valve is normal in structure. Mild mitral valve  regurgitation.   4. AV is thickened, calcified with restricted motion. Peak and mean  gradients through the valve are 59 and 35 mm Hg respectively AVA (VTI) is  0.62 cm2 Dimensionless valve index is 0.22 Overall consistent with severe  AS. Note Stroke volume index low (31)   Compared to echo from 2024, mean gradient  is increased (28 to 35 mm Hg).  Aortic valve regurgitation is mild.   Treatments: surgery:   CARDIOVASCULAR SURGERY OPERATIVE NOTE   09/07/2024   Surgeon:  Dorise LOIS Fellers, MD   First Assistant: Laurel Becket,  PA-C:   An experienced assistant was required given the complexity of this surgery and the standard of surgical care. The assistant was needed for exposure, dissection, suctioning, retraction of delicate tissues and sutures, instrument exchange and for overall help during this procedure.    Preoperative Diagnosis:  Severe single-vessel coronary artery disease and severe aortic stenosis    Postoperative Diagnosis:  Same    Procedure:   Median Sternotomy Extracorporeal circulation 3.   Coronary artery bypass grafting x 1   Left internal mammary artery graft to the LAD   4.   Aortic valve replacement using a 23 mm Edwards INSPIRIS RESILIA pericardial valve. 5.   Clipping of left atrial appendage.  Discharge Exam: Blood pressure 117/71, pulse (!) 101, temperature 98 F (36.7 C), temperature source Oral, resp. rate 20, height 5' 9 (1.753 m), weight 80.9 kg, SpO2 95%. General appearance: alert, cooperative, and no distress Neurologic: intact Heart: Irregularly irregular rhythm.  Monitor shows A-fib 90 to 100-minute since 4:50 AM Lungs: Normal respiratory effort on room air.  Breath sounds are clear to auscultation. Abdomen: Soft, no tenderness Extremities: No peripheral edema. Wound: The sternotomy incision is well-approximated and dry.  Disposition:   Discharge Instructions     Amb Referral to Cardiac Rehabilitation   Complete by: As directed    Diagnosis:  CABG Valve Replacement     Valve: Aortic   CABG X ___: 1   After initial evaluation and assessments completed: Virtual Based Care may be provided alone or in conjunction with Phase 2 Cardiac Rehab based on patient barriers.: Yes   Intensive Cardiac Rehabilitation (ICR) [facility] location only OR Traditional  Cardiac Rehabilitation (TCR) *If criteria for ICR are not met will enroll in TCR ([facility] only): Yes      Allergies as of 09/12/2024   No Known Allergies      Medication List     STOP taking these medications    methotrexate 2.5 MG tablet Commonly known as: RHEUMATREX       TAKE these medications    amiodarone  200 MG tablet  Commonly known as: PACERONE  Take 2 tablets (400 mg total) by mouth 2 (two) times daily. For 7 days then reduce the dose to 1 tablet (200mg  TWICE daily for 2 weeks then reduce the dose to 1 tablet  (200mg ) ONCE daily.   aspirin  EC 81 MG tablet Take 81 mg by mouth in the morning. Swallow whole.   buPROPion 150 MG 24 hr tablet Commonly known as: WELLBUTRIN XL Take 150 mg by mouth every morning.   cetirizine 10 MG tablet Commonly known as: ZYRTEC Take 10 mg by mouth in the morning.   diclofenac Sodium 1 % Gel Commonly known as: VOLTAREN Apply 1 Application topically daily as needed (pain).   escitalopram  10 MG tablet Commonly known as: LEXAPRO  Take 10 mg by mouth daily.   flunisolide  25 MCG/ACT (0.025%) Soln Commonly known as: NASALIDE  Place 1-2 sprays into the nose 2 (two) times daily as needed (nasal congestion).   ipratropium 0.03 % nasal spray Commonly known as: ATROVENT  Place 1-2 sprays into both nostrils 2 (two) times daily as needed (nasal drainage).   metoprolol  tartrate 25 MG tablet Commonly known as: LOPRESSOR  Take 1 tablet (25 mg total) by mouth 2 (two) times daily.   montelukast  10 MG tablet Commonly known as: SINGULAIR  Take 1 tablet (10 mg total) by mouth daily as needed (for allergies). What changed: when to take this   oxyCODONE  5 MG immediate release tablet Commonly known as: Oxy IR/ROXICODONE  Take 1-2 tablets (5-10 mg total) by mouth every 3 (three) hours as needed for up to 7 days for severe pain (pain score 7-10).   rivaroxaban  20 MG Tabs tablet Commonly known as: XARELTO  Take 1 tablet (20 mg total) by mouth daily with  supper.   rosuvastatin  20 MG tablet Commonly known as: CRESTOR  Take 20 mg by mouth at bedtime.   sildenafil 100 MG tablet Commonly known as: VIAGRA Take 100 mg by mouth daily as needed for erectile dysfunction.   tadalafil 20 MG tablet Commonly known as: CIALIS Take 20 mg by mouth in the morning.        Follow-up Information     Rutha Manuelita HERO, PA-C. Go on 09/20/2024.   Specialties: Physician Assistant, Thoracic Surgery Why: Your follow up appointment is at 10:00 Please arrive about 1 hour early for a chest x-ray to be performed on the second floor of the same building. Contact information: [address] [place] [address] [phone]         [facility] CV Img Echo at [facility] [place] [employer] [employer]. Go on 10/19/2024.   Specialty: Cardiology Why: Your follow up echocardiogram is at 8:30 Contact information: [address] [place]  [address] [phone]        Rutha Manuelita HERO, PA-C. Go on 10/06/2024.   Specialties: Physician Assistant, Thoracic Surgery Why: Your follow appointment is at 10am.  Please arrive about 1 hour early for a chest x-ray to be performed on the 2nd floor of the same building. Contact information: [address], [place] [address] [phone]         Corlis Pagan, NP Follow up on 09/15/2024.   Why: Hospital Follow Up Appointment @ 2:45 pm. Contact information: [address] [place] [address] [place] [address] [phone]         Elaine Miriam BRAVO, NP. Go on 10/07/2024.   Specialty: Cardiology Why: Your appointment is a 8:45am. Contact information: [address] [place] [address]  [phone]                 The patient has been discharged on:   1.Beta Blocker:  Yes [  x ]                              No   [   ]                              If No, reason:  2.Ace Inhibitor/ARB: Yes [   ]                                     No  [   x  ]                                     If No, reason: Titrating other medications  3.Statin:   Yes [ x  ]                  No  [   ]                  If No, reason:  4.Ecasa:  Yes  [ x  ]                  No   [   ]                  If No, reason:  5. ACS on Admission?  No  P2Y12 Inhibitor:  Yes  [   ]                                No  [x  ]    Signed: Caspian Deleonardis G. Hildreth Orsak, PA-C 09/12/2024, 1:21 PM

## 2024-09-07 NOTE — Procedures (Signed)
 Extubation Procedure Note  Patient Details:   Name: Cody Mcconnell DOB: 1946-03-26 MRN: [id number]   Airway Documentation:    Vent end date: 09/07/24 Vent end time: 1555   Evaluation  O2 sats: stable throughout Complications: No apparent complications Patient did tolerate procedure well. Bilateral Breath Sounds: Clear, Diminished   Patient extubated to 4L [place] per rapid wean protocol. pH 7.30 after wean trial. Discussed with Christian NP with CCM who said to extubate. Patient passed breathing mechanics prior to extubation with NIF -35 cmH2O, VC 1.1L. Patient able to speak & cough post extubation.  Tish Eva Lenis 09/07/2024, 4:01 PM

## 2024-09-07 NOTE — Discharge Instructions (Addendum)
 Discharge Instructions:  1. You may shower, please wash incisions daily with soap and water  and keep dry.  If you wish to cover wounds with dressing you may do so but please keep clean and change daily.  No tub baths or swimming until incisions have completely healed.  If your incisions become red or develop any drainage please call our office at [phone]  2. No Driving until cleared by Dr. Jeralyn office and you are no longer using narcotic pain medications  3. Monitor your weight daily.. Please use the same scale and weigh at same time... If you gain 5-10 lbs in 48 hours with associated lower extremity swelling, please contact our office at [phone]  4. Fever of 101.5 for at least 24 hours with no source, please contact our office at [phone]  5. Activity- up as tolerated, please walk at least 3 times per day.  Avoid strenuous activity, no lifting, pushing, or pulling with your arms over 8-10 lbs for a minimum of 6 weeks  6. If any questions or concerns arise, please do not hesitate to contact our office at [phone]  7. Call your primary care provider Hershall Cork, [place]  [phone])  for a follow up appointment within 2 weeks of hospital discharge.   -------------------------------------------------------------------------------------------------------------------------------------------------------------------- Information on my medicine - XARELTO  (Rivaroxaban )  This medication education was reviewed with me or my healthcare representative as part of my discharge preparation.    Why was Xarelto  prescribed for you? Xarelto  was prescribed for you to reduce the risk of a blood clot forming that can cause a stroke if you have a medical condition called atrial fibrillation (a type of irregular heartbeat).  What do you need to know about xarelto  ? Take your Xarelto  ONCE DAILY at the same time every day with your evening meal. If you have difficulty swallowing the tablet  whole, you may crush it and mix in applesauce just prior to taking your dose.  Take Xarelto  exactly as prescribed by your doctor and DO NOT stop taking Xarelto  without talking to the doctor who prescribed the medication.  Stopping without other stroke prevention medication to take the place of Xarelto  may increase your risk of developing a clot that causes a stroke.  Refill your prescription before you run out.  After discharge, you should have regular check-up appointments with your healthcare provider that is prescribing your Xarelto .  In the future your dose may need to be changed if your kidney function or weight changes by a significant amount.  What do you do if you miss a dose? If you are taking Xarelto  ONCE DAILY and you miss a dose, take it as soon as you remember on the same day then continue your regularly scheduled once daily regimen the next day. Do not take two doses of Xarelto  at the same time or on the same day.   Important Safety Information A possible side effect of Xarelto  is bleeding. You should call your healthcare provider right away if you experience any of the following: Bleeding from an injury or your nose that does not stop. Unusual colored urine (red or dark brown) or unusual colored stools (red or black). Unusual bruising for unknown reasons. A serious fall or if you hit your head (even if there is no bleeding).  Some medicines may interact with Xarelto  and might increase your risk of bleeding while on Xarelto . To help avoid this, consult your healthcare provider or pharmacist prior to using any new prescription or non-prescription medications, including  herbals, vitamins, non-steroidal anti-inflammatory drugs (NSAIDs) and supplements.  This website has more information on Xarelto : www.xarelto .com.

## 2024-09-07 NOTE — Anesthesia Procedure Notes (Signed)
 Arterial Line Insertion Start/End12/30/2025 6:50 AM, 09/07/2024 6:54 AM Performed by: Epifanio Fallow, MD, Madeleine Fenn, Derrek Dacosta, CRNA, CRNA  Patient location: Pre-op. Preanesthetic checklist: patient identified, IV checked, site marked, risks and benefits discussed, surgical consent, monitors and equipment checked, pre-op evaluation, timeout performed and anesthesia consent Lidocaine  1% used for infiltration and patient sedated Left, radial was placed Catheter size: 20 G Hand hygiene performed  and maximum sterile barriers used  Allen's test indicative of satisfactory collateral circulation Attempts: 1 Following insertion, dressing applied and Biopatch. Post procedure assessment: normal and unchanged  Patient tolerated the procedure well with no immediate complications.

## 2024-09-07 NOTE — Brief Op Note (Signed)
 09/07/2024  11:21 AM  PATIENT:  Cody Mcconnell  78 y.o. male  PRE-OPERATIVE DIAGNOSIS:  Coronary Artery Disease, Aortic Stenosis, Atrial Fibrillation  POST-OPERATIVE DIAGNOSIS:  Coronary Artery Disease, Aortic Stenosis, Atrial Fibrillation  PROCEDURE:  CORONARY ARTERY BYPASS GRAFTING (CABG) TIMES ONE USING LEFT INTERNAL MAMMARY ARTERY   LIMA->LAD  AORTIC VALVE REPLACEMENT with 23 mm INSPIRIS BIOPROSTHETIC VALVE  APPLICATION OF LEFT ATRIAL APPENDAGE CLIP  ECHOCARDIOGRAM, TRANSESOPHAGEAL, INTRAOPERATIVE  SURGEON:  Lucas Dorise POUR, MD - Primary  PHYSICIAN ASSISTANT: Steffanie Mingle  ASSISTANTS: Dyann Iha, RN, RN First Assistant             Rudy Zebedee BROCKS, RN, Scrub Person   ANESTHESIA:   general  EBL:   BLOOD ADMINISTERED:none  DRAINS: Mediastinal drains   SPECIMEN:  Aortic valve leaflets  DISPOSITION OF SPECIMEN:  PATHOLOGY  COUNTS:  Correct  DICTATION: .Dragon Dictation  PLAN OF CARE: Admit to inpatient   PATIENT DISPOSITION:  ICU - intubated and hemodynamically stable.   Delay start of Pharmacological VTE agent (>24hrs) due to surgical blood loss or risk of bleeding: yes

## 2024-09-07 NOTE — Consult Note (Addendum)
 "  NAME:  Cody Mcconnell, MRN:  [id number], DOB:  Dec 04, 1945, LOS: 0 ADMISSION DATE:  09/07/2024, CONSULTATION DATE:  09/07/24 REFERRING MD:  Lucas  CHIEF COMPLAINT:  CAD  History of Present Illness:  Patient is a 78 year old male with significant past medical history of hypertension, hyperlipidemia, prediabetes proximal A-fib, moderate aortic stenosis, Meniere's disease, hearing loss, BPH, anxiety, seasonal allergies recent diagnosis of CAD who presents for elective CABG x 1 and AVR with left atrial appendage clipping by MD Bartel to address CAD and aortic stenosis.  PCCM consulted to assist with postop ventilator management as well as critical care needs while residing in ICU.  Total Pump Time: 112 X Clamp Time: 91 mins  EBL: ~150 Blood products: none Cell Saver: 436   Pertinent  Medical History   Past Medical History:  Diagnosis Date   Anemia    SLIGHT ANEMIA 4 MONTHS AGO   Aortic valve stenosis 12/19/2017   Echo 07/30/16 - EF 55-60, mild aortic stenosis (mean 9) // Echo 4/19:  EF 60-65 mild AS (mean 11) // Echocardiogram 11/21: EF 60-65, no RWMA, mild LVH, Gr 1 DD, normal RVSF, trivial MR, calcified AV, mod AS (mean 20 mmHg, Vmax 280 cm/s, DI 0.27), trivial AI  // Echo 5/22: EF 60-65, moderate LVH, no RWMA, trivial MR, moderate AS (mean gradient 20 mmHg, V-max 287 cm/s, DI 0.35)    Arthritis    OA   Benign prostate hyperplasia    unspecified whether lower urinary tract sym. present    Carotid atherosclerosis    Carotid US  4/19:  bilat ICA 1-39; vertebrals and subclavians normal   Carotid atherosclerosis, bilateral 04/30/2018   1-39% on 12/2017 Care Everywhere Echo   Coronary artery calcification seen on CT scan 06/05/2018   Dysrhythmia    ATRIAL FIB   Elevated prostate specific antigen (PSA) 08/30/2016   Glucose intolerance    pre-diabetic   Hayfever    Heart murmur    History of exercise stress test    GXT 4/19:  ETT with good exercise tolerance (10:00); no chest  pain; normal BP response; no diagnostic ST changes; negative adequate ETT.   HOH (hard of hearing)    BOTH EARS   Low back pain with sciatica 06/02/2012   Lumbar herniated disc 01/30/2018   Mild aortic stenosis 04/30/2018   12/2017 Care Everywhere Echo : Mean gradient (S): 11 mm Hg. Peak gradient (S): 24 mm Hg.   Numbness of right thumb    FROM CERVICAL NECK SURGERY   Osteoarthritis of right hip 09/21/2018   Overweight (BMI 25.0-29.9) 09/30/2018   Paroxysmal atrial fibrillation (HCC) 06/05/2018   Pre-diabetes    Prediabetes 04/30/2018   not sure about this   Prostatitis    unspecified prostatitis type   Pure hypercholesterolemia      Significant Hospital Events: Including procedures, antibiotic start and stop dates in addition to other pertinent events   12/30 CABG x 1, AVR with left atrial appendage clipping  Interim History / Subjective:  POD 0   Intubated, sedated on propofol , paralytic reversed at beside   Albumin , txa, insulin , neo, dex, prop, LR-carrier fluid infusing   Paced at 80   CI 2.02 after arrival to unit   Objective    Blood pressure (!) 145/66, pulse 72, temperature 98 F (36.7 C), temperature source Oral, resp. rate 18, height 5' 9 (1.753 m), weight 79.5 kg, SpO2 93%.        Intake/Output Summary (Last 24 hours) at 09/07/2024  1135 Last data filed at 09/07/2024 1113 Gross per 24 hour  Intake 1550 ml  Output 715 ml  Net 835 ml   Filed Weights   09/07/24 0559  Weight: 79.5 kg    Examination: General: acute on chronic older adult male, lying in icu bed on vent s/p CABG x1/AVR HEENT: Normocephalic, PERRLA intact, ETT, OG, Pink MM CV: s1,s2, Paced-80, no MRG, No JVD, swan in place-left, mediastinal and pleural chest tubes- minimal bloody output pulm: clear, diminished, no distress on vent  Abs: bs active, soft  Extremities: sedated, no edema Skin: no rash  Neuro: Rass 0, responds to painful stimuli, cough gag reflex present  GU: foley intact    Istat Labs:   pH 7.397 PCO2 33.4 PO2 72 Bicarb 20.9   Na 141 K 3.6 Ionized Calcium  1.19   Hgb 11.6 HCT 34.0   Resolved problem list   Assessment and Plan  CAD and Aortic Stenosis with single vessel disease s/p AVR and CABG x1  LIMA to LAD 07/15/24 Echo 65-70%, LV normal function, no regional wall abnormalities, moderate concentric LVH, Grade II diastolic dysfunction, RV function normal, RV size normal, mild mitral valve regurg, AV severe stenosis  P: Postoperative management per TCTS CT management per protocol-continue monitoring output, monitor for significant bleeding  Wean pressors for SBP goal 120, continue neo to meet goal  Postoperative ancef /vanc for surgical ppx Continue ASA and statin Wean insulin  gtt per protocol Ensure adequate multimodal pain control  Continue to utilize albumin  for volume resuscitation   Acute respiratory insufficiency, postop P: Continue on protective ventilation VAP prevention bundle in place Rapid weaning protocol ordered is in place  Hypertension P: Holding antihypertensive for now, as patient is requiring low-dose phenylephrine   Hyperlipidemia P: Continue statin   Prediabetes P: Currently on insulin  infusion, monitor fingerstick with goal 140-180  Expected perioperative blood loss anemia Thrombocytopenia due to CPB P: Monitor H/H and PLT counts  Meniere's Disease- once on methotrexate, per documentation-currently off  Hearing loss- mixed conductive and sensorineural hearing loss P: Monitor for vertigo post op May benefit from meclizine  Follows ENT/Otolaryngology outpt at [facility]   Seasonal Allergies  P: Continue to zytec, Singulair   BPH P: On no medications Continue supportive care   Anxiety P: Continue lexapro  Wellbutrin   Labs   CBC: Recent Labs  Lab 09/03/24 1140 09/07/24 0746 09/07/24 0931 09/07/24 0936 09/07/24 1018 09/07/24 1030 09/07/24 1045  WBC 6.0  --   --   --   --   --   --   HGB  14.5   < > 9.9* 9.5* 10.9* 10.2* 10.9*  HCT 44.5   < > 29.0* 28.0* 32.0* 30.0* 32.7*  MCV 85.4  --   --   --   --   --   --   PLT 224  --   --   --   --   --  188   < > = values in this interval not displayed.    Basic Metabolic Panel: Recent Labs  Lab 09/03/24 1140 09/07/24 0746 09/07/24 0750 09/07/24 0931 09/07/24 0936 09/07/24 1018 09/07/24 1030  NA 139   < > 141 139 138 139 142  K 4.3   < > 4.0 5.0 5.0 4.7 4.5  CL 106  --  106  --  104  --  103  CO2 26  --   --   --   --   --   --   GLUCOSE 115*  --  120*  --  91  --  127*  BUN 17  --  18  --  14  --  17  CREATININE 0.84  --  0.90  --  0.70  --  0.80  CALCIUM  9.0  --   --   --   --   --   --    < > = values in this interval not displayed.   GFR: Estimated Creatinine Clearance: 77.3 mL/min (by C-G formula based on SCr of 0.8 mg/dL). Recent Labs  Lab 09/03/24 1140  WBC 6.0    Liver Function Tests: Recent Labs  Lab 09/03/24 1140  AST 20  ALT 20  ALKPHOS 74  BILITOT 0.4  PROT 6.7  ALBUMIN  4.3   No results for input(s): LIPASE, AMYLASE in the last 168 hours. No results for input(s): AMMONIA in the last 168 hours.  ABG    Component Value Date/Time   PHART 7.262 (L) 09/07/2024 1018   PCO2ART 54.3 (H) 09/07/2024 1018   PO2ART 326 (H) 09/07/2024 1018   HCO3 24.4 09/07/2024 1018   TCO2 29 09/07/2024 1030   ACIDBASEDEF 3.0 (H) 09/07/2024 1018   O2SAT 100 09/07/2024 1018     Coagulation Profile: Recent Labs  Lab 09/03/24 1140  INR 1.1    Cardiac Enzymes: No results for input(s): CKTOTAL, CKMB, CKMBINDEX, TROPONINI in the last 168 hours.  HbA1C: Hgb A1c MFr Bld  Date/Time Value Ref Range Status  09/03/2024 11:00 AM 5.8 (H) 4.8 - 5.6 % Final    Comment:    (NOTE) Diagnosis of Diabetes The following HbA1c ranges recommended by the American Diabetes Association (ADA) may be used as an aid in the diagnosis of diabetes mellitus.  Hemoglobin             Suggested A1C NGSP%               Diagnosis  <5.7                   Non Diabetic  5.7-6.4                Pre-Diabetic  >6.4                   Diabetic  <7.0                   Glycemic control for                       adults with diabetes.    08/08/2021 11:44 AM 6.3 (H) 4.8 - 5.6 % Final    Comment:    (NOTE)         Prediabetes: 5.7 - 6.4         Diabetes: >6.4         Glycemic control for adults with diabetes: <7.0     CBG: No results for input(s): GLUCAP in the last 168 hours.  Review of Systems:   See HPI   Past Medical History:  He,  has a past medical history of Anemia, Aortic valve stenosis (12/19/2017), Arthritis, Benign prostate hyperplasia, Carotid atherosclerosis, Carotid atherosclerosis, bilateral (04/30/2018), Coronary artery calcification seen on CT scan (06/05/2018), Dysrhythmia, Elevated prostate specific antigen (PSA) (08/30/2016), Glucose intolerance, Hayfever, Heart murmur, History of exercise stress test, HOH (hard of hearing), Low back pain with sciatica (06/02/2012), Lumbar herniated disc (01/30/2018), Mild aortic stenosis (04/30/2018), Numbness of right thumb, Osteoarthritis of right hip (09/21/2018), Overweight (BMI 25.0-29.9) (  09/30/2018), Paroxysmal atrial fibrillation (HCC) (06/05/2018), Pre-diabetes, Prediabetes (04/30/2018), Prostatitis, and Pure hypercholesterolemia.   Surgical History:   Past Surgical History:  Procedure Laterality Date   ANTERIOR LAT LUMBAR FUSION Right 06/29/2020   Procedure: Right Lumbar Two-Three Anterolateral lumbar interbody fusion with lateral plate;  Surgeon: Unice Pac, MD;  Location: [facility] OR;  Service: Neurosurgery;  Laterality: Right;  Right Lumbar Two-Three Anterolateral lumbar interbody fusion with lateral plate   CERVICAL SPINE SURGERY  2011   COLONOSCOPY     CORONARY ANGIOGRAPHY N/A 08/04/2024   Procedure: CORONARY ANGIOGRAPHY;  Surgeon: Wonda Sharper, MD;  Location: [facility] INVASIVE CV LAB;  Service: Cardiovascular;  Laterality: N/A;   CORONARY  PRESSURE/FFR WITH 3D MAPPING N/A 08/04/2024   Procedure: Coronary Pressure/FFR w/3D Mapping;  Surgeon: Wonda Sharper, MD;  Location: [facility] INVASIVE CV LAB;  Service: Cardiovascular;  Laterality: N/A;   HIP SURGERY Right    arthroscopy   LUMBAR DISC SURGERY  2014   L2-3, L3 TO L4 X 2 2019 AT [facility]   LUMBAR FUSION  2019   psoas muscle repair (groin muscle?)  2022   SHOULDER ARTHROSCOPY Right    TOOTH EXTRACTION     TOTAL HIP ARTHROPLASTY Right 09/29/2018   Procedure: TOTAL HIP ARTHROPLASTY ANTERIOR APPROACH;  Surgeon: Ernie Cough, MD;  Location: [facility] ORS;  Service: Orthopedics;  Laterality: Right;  60     Social History:   reports that he quit smoking about 46 years ago. His smoking use included cigarettes. He started smoking about 57 years ago. He has a 18 pack-year smoking history. He has never used smokeless tobacco. He reports that he does not drink alcohol and does not use drugs.   Family History:  His family history includes CAD in his father; CAD (age of onset: 30) in his brother; Congestive Heart Failure in his father; Dementia in his mother.   Allergies Allergies[1]   Home Medications  Prior to Admission medications  Medication Sig Start Date End Date Taking? Authorizing Provider  aspirin  EC 81 MG tablet Take 81 mg by mouth in the morning. Swallow whole.   Yes [provider]  buPROPion (WELLBUTRIN XL) 150 MG 24 hr tablet Take 150 mg by mouth every morning. Patient not taking: Reported on 09/03/2024 05/03/24  Yes [provider]  cetirizine (ZYRTEC) 10 MG tablet Take 10 mg by mouth in the morning.   Yes [provider]  escitalopram (LEXAPRO) 10 MG tablet Take 10 mg by mouth daily.   Yes [provider]  flunisolide  (NASALIDE ) 25 MCG/ACT (0.025%) SOLN Place 1-2 sprays into the nose 2 (two) times daily as needed (nasal congestion). 06/10/24  Yes Luke Orlan HERO, DO  ipratropium (ATROVENT ) 0.03 % nasal spray Place 1-2 sprays into both nostrils 2  (two) times daily as needed (nasal drainage). 06/10/24  Yes Luke Orlan HERO, DO  methotrexate (RHEUMATREX) 2.5 MG tablet Take 10 mg by mouth every Thursday. Patient not taking: Reported on 09/03/2024   Yes [provider]  montelukast  (SINGULAIR ) 10 MG tablet Take 1 tablet (10 mg total) by mouth daily as needed (for allergies). Patient taking differently: Take 10 mg by mouth in the morning. 04/09/23  Yes Krishnan, Gokul, MD  rosuvastatin  (CRESTOR ) 20 MG tablet Take 20 mg by mouth at bedtime. 06/16/24  Yes [provider]  sildenafil (VIAGRA) 100 MG tablet Take 100 mg by mouth daily as needed for erectile dysfunction.   Yes [provider]  tadalafil (CIALIS) 20 MG tablet Take 20 mg by  mouth in the morning.   Yes [provider]  diclofenac Sodium (VOLTAREN) 1 % GEL Apply 1 Application topically daily as needed (pain).    [provider]     Critical care time: 50 min     Christian Adaisha Campise AGACNP-BC   [facility] 09/07/2024, 1:49 PM  Please see [email or web address] for pager details.  From 7A-7P if no response, please call [phone].             [1] No Known Allergies  "

## 2024-09-07 NOTE — Anesthesia Procedure Notes (Signed)
 Central Venous Catheter Insertion Performed by: Epifanio Fallow, MD, anesthesiologist Start/End12/30/2025 6:40 AM, 09/07/2024 7:00 AM Patient location: Pre-op. Preanesthetic checklist: patient identified, IV checked, site marked, risks and benefits discussed, surgical consent, monitors and equipment checked, pre-op evaluation, timeout performed and anesthesia consent Hand hygiene performed  and maximum sterile barriers used  PA cath was placed.Swan type:thermodilution PA Cath depth:50 Attempts: 1 Patient tolerated the procedure well with no immediate complications.

## 2024-09-07 NOTE — Progress Notes (Signed)
 Ordering 6 week post AVR echocardiogram. Results to Dr. Lucas and Dr. Wonda

## 2024-09-07 NOTE — Transfer of Care (Signed)
 Immediate Anesthesia Transfer of Care Note  Patient: Cody Mcconnell  Procedure(s) Performed: CORONARY ARTERY BYPASS GRAFTING (CABG) TIMES ONE USING LEFT INTERNAL MAMMARY ARTERY (Chest) REPLACEMENT, AORTIC VALVE, OPEN USING 23 MM INSPIRIS RESILIA AORTIC VALVE (Chest) CLIPPING, LEFT ATRIAL APPENDAGE USING 40 MM ATRICLIP ECHOCARDIOGRAM, TRANSESOPHAGEAL, INTRAOPERATIVE  Patient Location: ICU  Anesthesia Type:General  Level of Consciousness: Patient remains intubated per anesthesia plan  Airway & Oxygen Therapy: Patient remains intubated per anesthesia plan and Patient placed on Ventilator (see vital sign flow sheet for setting)  Post-op Assessment: Report given to RN and Post -op Vital signs reviewed and stable  Post vital signs: Reviewed and stable  Last Vitals:  Vitals Value Taken Time  BP 127/62   Temp    Pulse 80 09/07/24 12:55  Resp 15 09/07/24 12:55  SpO2 97 % 09/07/24 12:55  Vitals shown include unfiled device data.  Last Pain:  Vitals:   09/07/24 0559  TempSrc: Oral  PainSc: 0-No pain         Complications: No notable events documented.

## 2024-09-07 NOTE — Hospital Course (Addendum)
 PCP is Corlis Pagan, NP Referring Provider is Wonda Sharper, MD  History of Present Illness: The patient is a 78 year old gentleman with a history of hyperlipidemia, hypertension, paroxysmal atrial fibrillation, and moderate aortic stenosis followed by echocardiogram.  A 2D echocardiogram on 04/02/2023 showed a severely thickened and calcified aortic valve with a mean gradient of 31 mmHg.  Left ventricular ejection fraction was 55 to 60%.  Since then he had 2 different episodes of exertional chest discomfort and shortness of breath associated with activity.  The first occurred when he was in [place] walking up hills.  He developed a severe squeezing sensation in his chest relieved with rest.  He had a second episode when he was at the [facility] attending a pain rehabilitation clinic and had to do a lot of walking.  This was a similar severe squeezing sensation in the chest.  Since then he has had intermittent episodes of this squeezing sensation in his chest as well as exertional shortness of breath and fatigue.  If he is up doing activity in the morning for a few hours then he needs to take a nap for a few hours.  He still swims and has had no exertional chest discomfort or shortness of breath with this.  His denies orthopnea and peripheral edema.  He has had dizziness but no syncope.  He had a follow-up echocardiogram on 07/15/2024 showing an increase in the mean gradient to 35 mmHg with a valve area by VTI of 0.62 cm and dimensionless index of 0.22.  Stroke-volume index was low at 31.  Left ventricular ejection fraction was 65 to 70%.  He subsequently underwent cardiac catheterization on 08/04/2024 showing an ostial LAD stenosis estimated 80%.  There was also proximal to mid 50% LAD stenosis.  RFR in the LAD was 0.75.  The distal left circumflex had about 50% stenosis.  The RCA had diffuse plaquing but no significant stenosis.   He lives with his wife.  He is retired.  He has chronic disability due to  multiple prior back surgeries causing back and lower extremity pain and weakness.   I agree that the best treatment for him is aortic valve replacement using a bioprosthetic valve and CABG to the LAD using a LIMA graft.  He has a history of paroxysmal atrial fibrillation but does not want to be on anticoagulation and has had a low burden of atrial fibrillation.  I think it would be worthwhile clipping his left atrial appendage to decrease his stroke risk.  I discussed the operative procedure with the patient and his wife including alternatives, benefits and risks; including but not limited to bleeding, blood transfusion, infection, stroke, myocardial infarction, graft failure, heart block requiring a permanent pacemaker, organ dysfunction, and death.  Cody Mcconnell understands and agrees to proceed.     Plan coronary artery bypass graft surgery x 1 with a LIMA to the LAD, aortic valve replacement using a bioprosthetic valve, and clipping of left atrial appendage.  Hospital Course: Cody Mcconnell was admitted for elective surgery on 09/07/24 and taken to the OR where single-vessel coronary bypass was accomplished with the left internal mammary artery grafted to the left anterior descending coronary artery. The aortic valve was replaced with a 23mm [employer] Inspiris bioprosthetic valve, and a clip was applied to the left atrial appendage. Following the procedure, he separated from cardiopulmonary bypass without difficulty requiring no inotropic support and was transferred to the surgical ICU in stable condition.  He was extubated the  evening of surgery without difficulty.  He required support with Neo-synephrine which was weaned as hemodynamics allowed.  The patient was started on lasix  to help facilitate diuresis.  His chest tubes, swan ganz catheter, and arterial lines were removed without difficulty.  He has chronic back pain and was treated with Lidocaine  patch and Toradol .  He developed  Atrial Fibrillation with RVR overnight.  He was treated with Amiodarone  bolus and drip protocol with prompt conversion back to NSR. By post-op day 3, he was ready for transfer to 4E Progressive Care. He regained independence with mobility and transfers. Diet was advanced and well tolerated.  He remained in SR until the early morning on post-op day 5 when h reverted abck to a-fib with VR ~95-100.  He tolerated this well.  He was given an additional bolus of IV amiodarone  in addition to the oral amiodarone  he was already receiving. He was started on apixaban  for the recurrent atrial fibrillation.

## 2024-09-07 NOTE — Anesthesia Postprocedure Evaluation (Signed)
"   Anesthesia Post Note  Patient: Cody Mcconnell  Procedure(s) Performed: CORONARY ARTERY BYPASS GRAFTING (CABG) TIMES ONE USING LEFT INTERNAL MAMMARY ARTERY (Chest) REPLACEMENT, AORTIC VALVE, OPEN USING 23 MM INSPIRIS RESILIA AORTIC VALVE (Chest) CLIPPING, LEFT ATRIAL APPENDAGE USING 40 MM ATRICLIP ECHOCARDIOGRAM, TRANSESOPHAGEAL, INTRAOPERATIVE     Patient location during evaluation: SICU Anesthesia Type: General Level of consciousness: sedated Pain management: pain level controlled Vital Signs Assessment: post-procedure vital signs reviewed and stable Respiratory status: patient remains intubated per anesthesia plan Cardiovascular status: stable Postop Assessment: no apparent nausea or vomiting Anesthetic complications: no   No notable events documented.  Last Vitals:  Vitals:   09/07/24 1450 09/07/24 1451  BP:    Pulse: 80 80  Resp: (!) 22 16  Temp: (!) 35.7 C (!) 35.7 C  SpO2: 100% 100%    Last Pain:  Vitals:   09/07/24 1400  TempSrc: Core  PainSc:                  Thom JONELLE Peoples      "

## 2024-09-07 NOTE — Progress Notes (Signed)
 Interim CCM Progress Note CAD and Aortic Stenosis with single vessel disease s/p AVR and CABG x1  LIMA to LAD Acute respiratory insufficiency, postop Patient following commands, weaning on vent, respiratory status stable  pH 7.302, PCO2 48.7, PO2 112, Bicarb 24.2, prior to extubation  Post extubation- oriented x 4, following commands Incentive Spirometer achieved- 900 ml obtained  P: Continue aggressive pulmonary hygiene, IS Dangle at side of bed, when medically appropriate within 2 hrs  Trend ABG per protocol  O2 Sat goal >92%, wean O2 as tolerated   Sherlean Sharps AGACNP-BC   [facility] 09/07/2024, 4:08 PM  Please see [email or web address] for pager details.  From 7A-7P if no response, please call [phone]. After hours, please call ELink [phone].

## 2024-09-07 NOTE — Op Note (Signed)
 "                                                                                            CARDIOVASCULAR SURGERY OPERATIVE NOTE  09/07/2024  Surgeon:  Dorise LOIS Fellers, MD  First Assistant: Laurel Becket,  PA-C:   An experienced assistant was required given the complexity of this surgery and the standard of surgical care. The assistant was needed for exposure, dissection, suctioning, retraction of delicate tissues and sutures, instrument exchange and for overall help during this procedure.   Preoperative Diagnosis:  Severe single-vessel coronary artery disease and severe aortic stenosis   Postoperative Diagnosis:  Same   Procedure:  Median Sternotomy Extracorporeal circulation 3.   Coronary artery bypass grafting x 1  Left internal mammary artery graft to the LAD   4.   Aortic valve replacement using a 23 mm Edwards INSPIRIS RESILIA pericardial valve. 5.   Clipping of left atrial appendage.   Anesthesia:  General Endotracheal   Clinical History/Surgical Indication:  This 78 year old gentleman has stage D, severe, symptomatic aortic stenosis with NYHA class II symptoms of exertional fatigue and shortness of breath as well as episodes of substernal chest discomfort consistent with angina.  His echocardiogram shows a severely calcified and thickened aortic valve with restricted leaflet mobility.  The mean gradient is 35 mmHg with a valve area of 0.62 cm and a dimensionless index of 0.22 with low stroke-volume index of 31 consistent with severe aortic stenosis.  Left ventricular ejection fraction is normal.  Cardiac catheterization shows an 80% ostial LAD stenosis with an RFR of 0.75.  I agree that the best treatment for him is aortic valve replacement using a bioprosthetic valve and CABG to the LAD using a LIMA graft.  He has a history of paroxysmal atrial fibrillation but does not want to be on anticoagulation and has had a low burden of atrial fibrillation.  I think it would be  worthwhile clipping his left atrial appendage to decrease his stroke risk.  I discussed the operative procedure with the patient and his wife including alternatives, benefits and risks; including but not limited to bleeding, blood transfusion, infection, stroke, myocardial infarction, graft failure, heart block requiring a permanent pacemaker, organ dysfunction, and death.  Cody Mcconnell understands and agrees to proceed.    Preparation:  The patient was seen in the preoperative holding area and the correct patient, correct operation were confirmed with the patient after reviewing the medical record and catheterization. The consent was signed by me. Preoperative antibiotics were given. A pulmonary arterial line and radial arterial line were placed by the anesthesia team. The patient was taken back to the operating room and positioned supine on the operating room table. After being placed under general endotracheal anesthesia by the anesthesia team a foley catheter was placed. The neck, chest, abdomen, and both legs were prepped with betadine soap and solution and draped in the usual sterile manner. A surgical time-out was taken and the correct patient and operative procedure were confirmed with the nursing and anesthesia staff.   Cardiopulmonary Bypass:  A median sternotomy was performed. The pericardium was  opened in the midline. Right ventricular function appeared normal. The ascending aorta was of normal size and had no palpable plaque. There were no contraindications to aortic cannulation or cross-clamping. The patient was fully systemically heparinized and the ACT was maintained > 400 sec. The proximal aortic arch was cannulated with a 20 F aortic cannula for arterial inflow. Venous cannulation was performed via the right atrial appendage using a two-staged venous cannula. An antegrade cardioplegia/vent cannula was inserted into the mid-ascending aorta. A left ventricular vent was placed via the  right superior pulmonary vein. A retrograde cardioplegia cannula was placed in the coronary sinus via the right atrium. Aortic occlusion was performed with a single cross-clamp. Systemic cooling to 32 degrees Centigrade and topical cooling of the heart with iced saline were used. Cold antegrade KBC cardioplegia was used to induce diastolic arrest and then warm KBC reanimation cardioplegia was given prior to removing the cross clamp. A temperature probe was inserted into the interventricular septum and an insulating pad was placed in the pericardium. CO2 was insufflated into the pericardial well to minimize intracardiac air.    Left internal mammary artery harvest:  The left side of the sternum was retracted using the Rultract retractor. The left internal mammary artery was harvested as a pedicle graft. All side branches were clipped. It was a large-sized vessel of good quality with excellent blood flow. It was ligated distally and divided. It was sprayed with topical papaverine  solution to prevent vasospasm.   Coronary arteries:  The coronary arteries were examined.  LAD:  large vessel in the mid portion with no disease LCX:  no visible disease in OM RCA:  mild segmental plaque   Grafts: Assisted by Laurel Becket, PA-C  LIMA to the LAD: 3 mm. It was sewn end to side using 8-0 prolene continuous suture.   Ligation of left atrial appendage:   The base of the appendage was measured and a 40 mm Atriclip Pro 2 was chosen. This was placed across the base of the LAA without difficulty.    Aortic Valve Replacement: Assisted by Laurel Becket, PA-C  A transverse aortotomy was performed 1 cm above the take-off of the right coronary artery. The native valve was tricuspid with calcified leaflets and moderate annular calcification. The ostia of the coronary arteries were in normal position and were not obstructed. The native valve leaflets were excised and the annulus was decalcified with  rongeurs. Care was taken to remove all particulate debris. The left ventricle was directly inspected for debris and then irrigated with ice saline solution. The annulus was sized and a size 23 mm Edwards INSPIRIS RESILIA pericardial valve was chosen. The model number was [id number] and the serial number was [id number]. 2-0 Ethibond pledgeted horizontal mattress sutures were placed around the annulus with the pledgets in a sub-annular position. The sutures were placed through the sewing ring and the valve lowered into place. The sutures were tied using CorKnots. The valve seated nicely and the coronary ostia were not obstructed. The prosthetic valve leaflets moved normally and there was no sub-valvular obstruction. The aortotomy was closed using 4-0 Prolene suture in 2 layers with felt strips to reinforce the closure.   Completion:  The patient was rewarmed to 37 degrees Centigrade. The clamp was removed from the LIMA pedicle and there was rapid warming of the septum and return of ventricular fibrillation. Deairing maneuvers were performed and the head placed in Trendelenburg position. The crossclamp was removed with a time of 91 minutes. There  was spontaneous return of sinus rhythm. The distal anastomosis was checked for hemostasis. The position of the graft was satisfactory. Two temporary epicardial pacing wires were placed on the right atrium and two on the right ventricle. The patient was weaned from CPB without difficulty on no inotropes. CPB time was 112 minutes. Cardiac output was 5 LPM. TEE showed a normally functioning aortic valve prosthesis with a low mean gradient and no paravalvular leak. No MR. Normal LV systolic function. Heparin  was fully reversed with protamine  and the aortic and venous cannulas removed. Hemostasis was achieved. Mediastinal and left pleural drainage tubes were placed. The sternum was closed with  #6 stainless steel wires. The fascia was closed with continuous # 1 vicryl suture. The  subcutaneous tissue was closed with 2-0 vicryl continuous suture. The skin was closed with 3-0 vicryl subcuticular suture. All sponge, needle, and instrument counts were reported correct at the end of the case. Dry sterile dressings were placed over the incisions and around the chest tubes which were connected to pleurevac suction. The patient was then transported to the surgical intensive care unit in stable condition.    "

## 2024-09-08 ENCOUNTER — Encounter (HOSPITAL_COMMUNITY): Payer: Self-pay | Admitting: Surgery

## 2024-09-08 ENCOUNTER — Inpatient Hospital Stay (HOSPITAL_COMMUNITY)

## 2024-09-08 DIAGNOSIS — I350 Nonrheumatic aortic (valve) stenosis: Secondary | ICD-10-CM

## 2024-09-08 DIAGNOSIS — Z951 Presence of aortocoronary bypass graft: Secondary | ICD-10-CM

## 2024-09-08 DIAGNOSIS — M549 Dorsalgia, unspecified: Secondary | ICD-10-CM

## 2024-09-08 DIAGNOSIS — I4891 Unspecified atrial fibrillation: Secondary | ICD-10-CM

## 2024-09-08 DIAGNOSIS — Z953 Presence of xenogenic heart valve: Secondary | ICD-10-CM

## 2024-09-08 DIAGNOSIS — G8929 Other chronic pain: Secondary | ICD-10-CM

## 2024-09-08 DIAGNOSIS — R739 Hyperglycemia, unspecified: Secondary | ICD-10-CM | POA: Diagnosis not present

## 2024-09-08 DIAGNOSIS — Z952 Presence of prosthetic heart valve: Secondary | ICD-10-CM | POA: Diagnosis not present

## 2024-09-08 LAB — GLUCOSE, CAPILLARY
Glucose-Capillary: 115 mg/dL — ABNORMAL HIGH (ref 70–99)
Glucose-Capillary: 116 mg/dL — ABNORMAL HIGH (ref 70–99)
Glucose-Capillary: 118 mg/dL — ABNORMAL HIGH (ref 70–99)
Glucose-Capillary: 121 mg/dL — ABNORMAL HIGH (ref 70–99)
Glucose-Capillary: 131 mg/dL — ABNORMAL HIGH (ref 70–99)
Glucose-Capillary: 131 mg/dL — ABNORMAL HIGH (ref 70–99)
Glucose-Capillary: 133 mg/dL — ABNORMAL HIGH (ref 70–99)
Glucose-Capillary: 136 mg/dL — ABNORMAL HIGH (ref 70–99)
Glucose-Capillary: 164 mg/dL — ABNORMAL HIGH (ref 70–99)

## 2024-09-08 LAB — BASIC METABOLIC PANEL WITH GFR
Anion gap: 9 (ref 5–15)
Anion gap: 9 (ref 5–15)
BUN: 14 mg/dL (ref 8–23)
BUN: 17 mg/dL (ref 8–23)
CO2: 22 mmol/L (ref 22–32)
CO2: 25 mmol/L (ref 22–32)
Calcium: 7.3 mg/dL — ABNORMAL LOW (ref 8.9–10.3)
Calcium: 8 mg/dL — ABNORMAL LOW (ref 8.9–10.3)
Chloride: 106 mmol/L (ref 98–111)
Chloride: 102 mmol/L (ref 98–111)
Creatinine, Ser: 0.81 mg/dL (ref 0.61–1.24)
Creatinine, Ser: 0.94 mg/dL (ref 0.61–1.24)
GFR, Estimated: >60 mL/min
GFR, Estimated: >60 mL/min
Glucose, Bld: 136 mg/dL — ABNORMAL HIGH (ref 70–99)
Glucose, Bld: 134 mg/dL — ABNORMAL HIGH (ref 70–99)
Potassium: 4.2 mmol/L (ref 3.5–5.1)
Potassium: 4 mmol/L (ref 3.5–5.1)
Sodium: 137 mmol/L (ref 135–145)
Sodium: 136 mmol/L (ref 135–145)

## 2024-09-08 LAB — CBC
HCT: 32.7 % — ABNORMAL LOW (ref 39.0–52.0)
HCT: 34.1 % — ABNORMAL LOW (ref 39.0–52.0)
Hemoglobin: 10.9 g/dL — ABNORMAL LOW (ref 13.0–17.0)
Hemoglobin: 11.3 g/dL — ABNORMAL LOW (ref 13.0–17.0)
MCH: 28.6 pg (ref 26.0–34.0)
MCH: 28.5 pg (ref 26.0–34.0)
MCHC: 33.3 g/dL (ref 30.0–36.0)
MCHC: 33.1 g/dL (ref 30.0–36.0)
MCV: 85.8 fL (ref 80.0–100.0)
MCV: 85.9 fL (ref 80.0–100.0)
Platelets: 148 K/uL — ABNORMAL LOW (ref 150–400)
Platelets: 152 K/uL (ref 150–400)
RBC: 3.81 MIL/uL — ABNORMAL LOW (ref 4.22–5.81)
RBC: 3.97 MIL/uL — ABNORMAL LOW (ref 4.22–5.81)
RDW: 13 % (ref 11.5–15.5)
RDW: 13 % (ref 11.5–15.5)
WBC: 9.6 K/uL (ref 4.0–10.5)
WBC: 10.2 K/uL (ref 4.0–10.5)
nRBC: 0 % (ref 0.0–0.2)
nRBC: 0 % (ref 0.0–0.2)

## 2024-09-08 LAB — PHOSPHORUS: Phosphorus: 3.1 mg/dL (ref 2.5–4.6)

## 2024-09-08 LAB — MAGNESIUM
Magnesium: 2.7 mg/dL — ABNORMAL HIGH (ref 1.7–2.4)
Magnesium: 2.5 mg/dL — ABNORMAL HIGH (ref 1.7–2.4)

## 2024-09-08 MED ORDER — ENOXAPARIN SODIUM 40 MG/0.4ML IJ SOSY
40.0000 mg | PREFILLED_SYRINGE | Freq: Every day | INTRAMUSCULAR | Status: DC
  Administered 2024-09-08 – 2024-09-09 (×2): 40 mg via SUBCUTANEOUS
  Filled 2024-09-08 (×2): qty 0.4

## 2024-09-08 MED ORDER — POTASSIUM CHLORIDE CRYS ER 20 MEQ PO TBCR
20.0000 meq | EXTENDED_RELEASE_TABLET | Freq: Two times a day (BID) | ORAL | Status: AC
  Administered 2024-09-08 (×2): 20 meq via ORAL
  Filled 2024-09-08 (×2): qty 1

## 2024-09-08 MED ORDER — FUROSEMIDE 10 MG/ML IJ SOLN
40.0000 mg | Freq: Two times a day (BID) | INTRAMUSCULAR | Status: AC
  Administered 2024-09-08 (×2): 40 mg via INTRAVENOUS
  Filled 2024-09-08 (×2): qty 4

## 2024-09-08 MED ORDER — FENTANYL CITRATE (PF) 50 MCG/ML IJ SOSY
25.0000 ug | PREFILLED_SYRINGE | Freq: Once | INTRAMUSCULAR | Status: AC
  Administered 2024-09-08: 25 ug via INTRAVENOUS
  Filled 2024-09-08: qty 1

## 2024-09-08 MED ORDER — SODIUM CHLORIDE 0.9% FLUSH: 10.0000 mL | INTRAVENOUS | Status: DC | PRN

## 2024-09-08 MED ORDER — INSULIN ASPART 100 UNIT/ML IJ SOLN
0.0000 [IU] | INTRAMUSCULAR | Status: DC
  Administered 2024-09-08 (×4): 2 [IU] via SUBCUTANEOUS
  Filled 2024-09-08 (×3): qty 2
  Filled 2024-09-08: qty 5

## 2024-09-08 MED ORDER — TEMAZEPAM 7.5 MG PO CAPS
15.0000 mg | ORAL_CAPSULE | Freq: Every evening | ORAL | Status: DC | PRN
  Administered 2024-09-10 – 2024-09-11 (×2): 15 mg via ORAL
  Filled 2024-09-08 (×2): qty 2

## 2024-09-08 MED ORDER — KETOROLAC TROMETHAMINE 15 MG/ML IJ SOLN
15.0000 mg | Freq: Four times a day (QID) | INTRAMUSCULAR | Status: DC | PRN
  Administered 2024-09-08 (×2): 15 mg via INTRAVENOUS
  Filled 2024-09-08 (×2): qty 1

## 2024-09-08 MED ORDER — LIDOCAINE 5 % EX PTCH
2.0000 | MEDICATED_PATCH | CUTANEOUS | Status: DC
  Administered 2024-09-08: 1 via TRANSDERMAL
  Administered 2024-09-09 – 2024-09-12 (×4): 2 via TRANSDERMAL
  Filled 2024-09-08 (×5): qty 2

## 2024-09-08 MED ORDER — SODIUM CHLORIDE 0.9% FLUSH
10.0000 mL | Freq: Two times a day (BID) | INTRAVENOUS | Status: DC
  Administered 2024-09-08 – 2024-09-10 (×3): 10 mL

## 2024-09-08 MED FILL — Heparin Sodium (Porcine) Inj 1000 Unit/ML: Qty: 1000 | Status: AC

## 2024-09-08 MED FILL — Thrombin (Recombinant) For Soln 20000 Unit: CUTANEOUS | Qty: 1 | Status: AC

## 2024-09-08 MED FILL — Magnesium Sulfate Inj 50%: INTRAMUSCULAR | Qty: 2 | Status: AC

## 2024-09-08 MED FILL — Potassium Chloride Inj 2 mEq/ML: INTRAVENOUS | Qty: 40 | Status: AC

## 2024-09-08 NOTE — Progress Notes (Signed)
 EVENING ROUNDS NOTE :     [address] [employer] [address]             [phone]               1 Day Post-Op Procedures (LRB): CORONARY ARTERY BYPASS GRAFTING (CABG) TIMES ONE USING LEFT INTERNAL MAMMARY ARTERY (N/A) REPLACEMENT, AORTIC VALVE, OPEN USING 23 MM INSPIRIS RESILIA AORTIC VALVE (N/A) CLIPPING, LEFT ATRIAL APPENDAGE USING 40 MM ATRICLIP (N/A) ECHOCARDIOGRAM, TRANSESOPHAGEAL, INTRAOPERATIVE (N/A)   Total Length of Stay:  LOS: 1 day  Events:   No events Off gtts Stable day    BP (!) 151/68   Pulse 91   Temp 98.2 F (36.8 C) (Oral)   Resp (!) 25   Ht 5' 9 (1.753 m)   Wt 84 kg   SpO2 94%   BMI 27.35 kg/m   PAP: (23-70)/(7-42) 38/17 CVP:  [3 mmHg-35 mmHg] 12 mmHg PCWP:  [14 mmHg-21 mmHg] 14 mmHg CO:  [4.9 L/min-6.3 L/min] 5.5 L/min CI:  [2.5 L/min/m2-3.2 L/min/m2] 2.8 L/min/m2      sodium chloride       ceFAZolin  (ANCEF ) IV 2 g (09/08/24 1554)   nitroGLYCERIN  Stopped (09/07/24 1340)   phenylephrine  (NEO-SYNEPHRINE) Adult infusion Stopped (09/08/24 0806)    I/O last 3 completed shifts: In: 5759.1 [P.O.:360; I.V.:2557.9; Blood:436; IV Piggyback:2405.2] Out: 3460 [Urine:2575; Emesis/NG output:140; Chest Tube:745]      Latest Ref Rng & Units 09/08/2024    4:39 AM 09/07/2024    6:17 PM 09/07/2024    6:16 PM  CBC  WBC 4.0 - 10.5 K/uL 9.6   10.9   Hemoglobin 13.0 - 17.0 g/dL 89.0  89.0  88.5   Hematocrit 39.0 - 52.0 % 32.7  32.0  34.2   Platelets 150 - 400 K/uL 148   169        Latest Ref Rng & Units 09/08/2024    4:39 AM 09/07/2024    6:17 PM 09/07/2024    6:16 PM  BMP  Glucose 70 - 99 mg/dL 863   877   BUN 8 - 23 mg/dL 14   16   Creatinine 9.38 - 1.24 mg/dL 9.18   9.15   Sodium 864 - 145 mmol/L 137  141  141   Potassium 3.5 - 5.1 mmol/L 4.2  4.3  4.5   Chloride 98 - 111 mmol/L 106   108   CO2 22 - 32 mmol/L 22   25   Calcium  8.9 - 10.3 mg/dL 7.3   7.7     ABG    Component Value Date/Time   PHART 7.319 (L)  09/07/2024 1817   PCO2ART 46.5 09/07/2024 1817   PO2ART 101 09/07/2024 1817   HCO3 23.8 09/07/2024 1817   TCO2 25 09/07/2024 1817   ACIDBASEDEF 2.0 09/07/2024 1817   O2SAT 97 09/07/2024 1817       Linnie Rayas, MD 09/08/2024 5:09 PM

## 2024-09-08 NOTE — Progress Notes (Signed)
 "  NAME:  Cody Mcconnell, MRN:  [id number], DOB:  1946-05-26, LOS: 1 ADMISSION DATE:  09/07/2024, CONSULTATION DATE:  09/07/24 REFERRING MD:  Lucas  CHIEF COMPLAINT:  CAD  History of Present Illness:  Patient is a 78 year old male with significant past medical history of hypertension, hyperlipidemia, prediabetes proximal A-fib, moderate aortic stenosis, Meniere's disease, hearing loss, BPH, anxiety, seasonal allergies recent diagnosis of CAD who presents for elective CABG x 1 and AVR with left atrial appendage clipping by MD Bartel to address CAD and aortic stenosis.  PCCM consulted to assist with postop ventilator management as well as critical care needs while residing in ICU.  Total Pump Time: 112 X Clamp Time: 91 mins  EBL: ~150 Blood products: none Cell Saver: 436   Pertinent  Medical History   Past Medical History:  Diagnosis Date   Anemia    SLIGHT ANEMIA 4 MONTHS AGO   Aortic valve stenosis 12/19/2017   Echo 07/30/16 - EF 55-60, mild aortic stenosis (mean 9) // Echo 4/19:  EF 60-65 mild AS (mean 11) // Echocardiogram 11/21: EF 60-65, no RWMA, mild LVH, Gr 1 DD, normal RVSF, trivial MR, calcified AV, mod AS (mean 20 mmHg, Vmax 280 cm/s, DI 0.27), trivial AI  // Echo 5/22: EF 60-65, moderate LVH, no RWMA, trivial MR, moderate AS (mean gradient 20 mmHg, V-max 287 cm/s, DI 0.35)    Arthritis    OA   Benign prostate hyperplasia    unspecified whether lower urinary tract sym. present    Carotid atherosclerosis    Carotid US  4/19:  bilat ICA 1-39; vertebrals and subclavians normal   Carotid atherosclerosis, bilateral 04/30/2018   1-39% on 12/2017 Care Everywhere Echo   Coronary artery calcification seen on CT scan 06/05/2018   Dysrhythmia    ATRIAL FIB   Elevated prostate specific antigen (PSA) 08/30/2016   Glucose intolerance    pre-diabetic   Hayfever    Heart murmur    History of exercise stress test    GXT 4/19:  ETT with good exercise tolerance (10:00); no chest  pain; normal BP response; no diagnostic ST changes; negative adequate ETT.   HOH (hard of hearing)    BOTH EARS   Low back pain with sciatica 06/02/2012   Lumbar herniated disc 01/30/2018   Mild aortic stenosis 04/30/2018   12/2017 Care Everywhere Echo : Mean gradient (S): 11 mm Hg. Peak gradient (S): 24 mm Hg.   Numbness of right thumb    FROM CERVICAL NECK SURGERY   Osteoarthritis of right hip 09/21/2018   Overweight (BMI 25.0-29.9) 09/30/2018   Paroxysmal atrial fibrillation (HCC) 06/05/2018   Pre-diabetes    Prediabetes 04/30/2018   not sure about this   Prostatitis    unspecified prostatitis type   Pure hypercholesterolemia      Significant Hospital Events: Including procedures, antibiotic start and stop dates in addition to other pertinent events   12/30 CABG x 1, AVR with left atrial appendage clipping  Interim History / Subjective:  Feels well. On low dose neosynephrine. Doing ok drinking, has not walked yet.   Objective    Blood pressure (!) 118/58, pulse 82, temperature 98 F (36.7 C), temperature source Oral, resp. rate 17, height 5' 9 (1.753 m), weight 84 kg, SpO2 94%. PAP: (23-70)/(7-42) 38/17 CVP:  [3 mmHg-35 mmHg] 12 mmHg CO:  [5.5 L/min] 5.5 L/min CI:  [2.8 L/min/m2] 2.8 L/min/m2      Intake/Output Summary (Last 24 hours) at 09/08/2024 2105 Last data  filed at 09/08/2024 1708 Gross per 24 hour  Intake 1336.26 ml  Output 1805 ml  Net -468.74 ml   Filed Weights   09/07/24 0559 09/08/24 0500  Weight: 79.5 kg 84 kg    Examination: General: elderly man sitting up in the chair in NAD, appears younger than stated age HEENT: [place]/AT, eyes anicteric CV: S1S2, RRR. Minimal chest tube output pulm: breathing comfortably on [place], reduced basilar breath sounds Abs: soft, NT Extremities:+edema  Neuro: awake, alert, answering questions appropriately GU: foley with amber urine  CXR personally reviewed> chest tubes, RIJ swan, dilated loops of bowel. Mild pulm  edema.  EKG: NSR, normal axis and intervals, no ischemic changes.    BUN 17 Cr 0.94 WBC 10.2 H/H 11.3/34.1 Platelets 152  Resolved problem list   Assessment and Plan  Severe AS, s/p AVR 1-vessel CAD s/p CABG x 1 ; LVEF 65-70% Remote h/o paroxysmal Afib; infrequent -complete post op antibiotics -d/c chest tubes, swan -keep wires -keep foley -lasix -progress diet and mobility -pain control per protocol- morphine , tramadol , oxycodone .  -daily aspirin  and statin -wean off neosynephrine for MAP >65 -tele monitoring -pulmonary hygiene   Hyperglycemia; h/o pre-DM. A1c 5.8 -transition to SSI PRN   H/o meniere's disease -con't to hold PTA MTX  Severe back pain, chronic issue -Toradol  and lidocaine  patch added for back pain  Anxiety -con't PTA lexapro  Labs   CBC: Recent Labs  Lab 09/03/24 1140 09/07/24 0746 09/07/24 1045 09/07/24 1130 09/07/24 1257 09/07/24 1258 09/07/24 1701 09/07/24 1816 09/07/24 1817 09/08/24 0439 09/08/24 1704  WBC 6.0  --   --   --  9.4  --   --  10.9*  --  9.6 10.2  HGB 14.5   < > 10.9*   < > 11.9*   < > 10.9* 11.4* 10.9* 10.9* 11.3*  HCT 44.5   < > 32.7*   < > 36.0*   < > 32.0* 34.2* 32.0* 32.7* 34.1*  MCV 85.4  --   --   --  86.3  --   --  85.7  --  85.8 85.9  PLT 224  --  188  --  155  --   --  169  --  148* 152   < > = values in this interval not displayed.    Basic Metabolic Panel: Recent Labs  Lab 09/03/24 1140 09/07/24 0746 09/07/24 1030 09/07/24 1130 09/07/24 1134 09/07/24 1258 09/07/24 1701 09/07/24 1816 09/07/24 1817 09/08/24 0439 09/08/24 1704  NA 139   < > 142   < > 139   < > 142 141 141 137 136  K 4.3   < > 4.5   < > 4.6   < > 4.7 4.5 4.3 4.2 4.0  CL 106   < > 103  --  107  --   --  108  --  106 102  CO2 26  --   --   --   --   --   --  25  --  22 25  GLUCOSE 115*   < > 127*  --  117*  --   --  122*  --  136* 134*  BUN 17   < > 17  --  16  --   --  16  --  14 17  CREATININE 0.84   < > 0.80  --  0.70  --    --  0.84  --  0.81 0.94  CALCIUM  9.0  --   --   --   --   --   --  7.7*  --  7.3* 8.0*  MG  --   --   --   --   --   --   --  3.2*  --  2.7* 2.5*  PHOS  --   --   --   --   --   --   --   --   --  3.1  --    < > = values in this interval not displayed.   GFR: Estimated Creatinine Clearance: 65.8 mL/min (by C-G formula based on SCr of 0.94 mg/dL). Recent Labs  Lab 09/07/24 1257 09/07/24 1816 09/08/24 0439 09/08/24 1704  WBC 9.4 10.9* 9.6 10.2    Liver Function Tests: Recent Labs  Lab 09/03/24 1140  AST 20  ALT 20  ALKPHOS 74  BILITOT 0.4  PROT 6.7  ALBUMIN  4.3     Leita SHAUNNA Gaskins, DO 09/08/2024 9:15 PM [facility]  For contact information, see Amion. If no response to pager, please call PCCM consult pager. After hours, 7PM- 7AM, please call Elink.       "

## 2024-09-08 NOTE — Progress Notes (Signed)
 1 Day Post-Op Procedures (LRB): CORONARY ARTERY BYPASS GRAFTING (CABG) TIMES ONE USING LEFT INTERNAL MAMMARY ARTERY (N/A) REPLACEMENT, AORTIC VALVE, OPEN USING 23 MM INSPIRIS RESILIA AORTIC VALVE (N/A) CLIPPING, LEFT ATRIAL APPENDAGE USING 40 MM ATRICLIP (N/A) ECHOCARDIOGRAM, TRANSESOPHAGEAL, INTRAOPERATIVE (N/A) Subjective: Only complaint is of back pain related to multiple prior surgeries.  Sitting up in chair and wants to stay in chair, more comfortable there.  Objective: Vital signs in last 24 hours: Temp:  [94.8 F (34.9 C)-99.5 F (37.5 C)] 98.4 F (36.9 C) (12/31 0644) Pulse Rate:  [61-132] 76 (12/31 0644) Cardiac Rhythm: Normal sinus rhythm (12/31 0000) Resp:  [5-37] 20 (12/31 0644) BP: (78-143)/(27-100) 110/56 (12/31 0634) SpO2:  [93 %-100 %] 95 % (12/31 0644) Arterial Line BP: (54-274)/(20-96) 136/48 (12/31 0644) FiO2 (%):  [40 %-50 %] 40 % (12/30 1525) Weight:  [84 kg] 84 kg (12/31 0500)  Hemodynamic parameters for last 24 hours: PAP: (10-70)/(2-42) 38/17 CVP:  [0 mmHg-38 mmHg] 12 mmHg PCWP:  [11 mmHg-21 mmHg] 14 mmHg CO:  [3.8 L/min-6.3 L/min] 5.5 L/min CI:  [1.9 L/min/m2-3.2 L/min/m2] 2.8 L/min/m2  Intake/Output from previous day: 12/30 0701 - 12/31 0700 In: 5759.1 [P.O.:360; I.V.:2557.9; Blood:436; IV Piggyback:2405.2] Out: 2515 [Urine:2030; Emesis/NG output:140; Chest Tube:345] Intake/Output this shift: Total I/O In: 563 [I.V.:463; IV Piggyback:100] Out: -   General appearance: alert and cooperative Neurologic: intact Heart: regular rate and rhythm Lungs: clear to auscultation bilaterally Extremities: edema mild Wound: dressing dry  Lab Results: Recent Labs    09/07/24 1816 09/07/24 1817 09/08/24 0439  WBC 10.9*  --  9.6  HGB 11.4* 10.9* 10.9*  HCT 34.2* 32.0* 32.7*  PLT 169  --  148*   BMET:  Recent Labs    09/07/24 1816 09/07/24 1817 09/08/24 0439  NA 141 141 137  K 4.5 4.3 4.2  CL 108  --  106  CO2 25  --  22  GLUCOSE 122*  --   136*  BUN 16  --  14  CREATININE 0.84  --  0.81  CALCIUM  7.7*  --  7.3*    PT/INR:  Recent Labs    09/07/24 1257  LABPROT 18.5*  INR 1.5*   ABG    Component Value Date/Time   PHART 7.319 (L) 09/07/2024 1817   HCO3 23.8 09/07/2024 1817   TCO2 25 09/07/2024 1817   ACIDBASEDEF 2.0 09/07/2024 1817   O2SAT 97 09/07/2024 1817   CBG (last 3)  Recent Labs    09/08/24 0033 09/08/24 0234 09/08/24 0437  GLUCAP 121* 118* 116*   CXR: mild interstitial edema  ECG: NSR 77, no acute changes or heart block  Assessment/Plan: S/P Procedures (LRB): CORONARY ARTERY BYPASS GRAFTING (CABG) TIMES ONE USING LEFT INTERNAL MAMMARY ARTERY (N/A) REPLACEMENT, AORTIC VALVE, OPEN USING 23 MM INSPIRIS RESILIA AORTIC VALVE (N/A) CLIPPING, LEFT ATRIAL APPENDAGE USING 40 MM ATRICLIP (N/A) ECHOCARDIOGRAM, TRANSESOPHAGEAL, INTRAOPERATIVE (N/A)  POD 1 Hemodynamically stable in NSR. Hold Lopressor  until stable off neo.  Wt is 10 lbs over preop. Start diuresis and KCL  DC chest tubes, swan, arterial line.  Lidocaine  patch and toradol  for back pain.  IS, ambulation.  Glucose under good control with normal Hgb A1c preop on no meds. Transition to SSI.   LOS: 1 day    Cody Mcconnell 09/08/2024

## 2024-09-09 ENCOUNTER — Inpatient Hospital Stay (HOSPITAL_COMMUNITY)

## 2024-09-09 DIAGNOSIS — I4891 Unspecified atrial fibrillation: Secondary | ICD-10-CM | POA: Diagnosis not present

## 2024-09-09 DIAGNOSIS — G8929 Other chronic pain: Secondary | ICD-10-CM | POA: Diagnosis not present

## 2024-09-09 DIAGNOSIS — M549 Dorsalgia, unspecified: Secondary | ICD-10-CM | POA: Diagnosis not present

## 2024-09-09 DIAGNOSIS — Z953 Presence of xenogenic heart valve: Secondary | ICD-10-CM | POA: Diagnosis not present

## 2024-09-09 DIAGNOSIS — I350 Nonrheumatic aortic (valve) stenosis: Secondary | ICD-10-CM | POA: Diagnosis not present

## 2024-09-09 DIAGNOSIS — Z951 Presence of aortocoronary bypass graft: Secondary | ICD-10-CM | POA: Diagnosis not present

## 2024-09-09 DIAGNOSIS — Z952 Presence of prosthetic heart valve: Secondary | ICD-10-CM | POA: Diagnosis not present

## 2024-09-09 DIAGNOSIS — R739 Hyperglycemia, unspecified: Secondary | ICD-10-CM | POA: Diagnosis not present

## 2024-09-09 LAB — BASIC METABOLIC PANEL WITH GFR
Anion gap: 9 (ref 5–15)
BUN: 20 mg/dL (ref 8–23)
CO2: 25 mmol/L (ref 22–32)
Calcium: 7.9 mg/dL — ABNORMAL LOW (ref 8.9–10.3)
Chloride: 102 mmol/L (ref 98–111)
Creatinine, Ser: 0.92 mg/dL (ref 0.61–1.24)
GFR, Estimated: >60 mL/min
Glucose, Bld: 123 mg/dL — ABNORMAL HIGH (ref 70–99)
Potassium: 3.9 mmol/L (ref 3.5–5.1)
Sodium: 135 mmol/L (ref 135–145)

## 2024-09-09 LAB — GLUCOSE, CAPILLARY
Glucose-Capillary: 114 mg/dL — ABNORMAL HIGH (ref 70–99)
Glucose-Capillary: 121 mg/dL — ABNORMAL HIGH (ref 70–99)
Glucose-Capillary: 138 mg/dL — ABNORMAL HIGH (ref 70–99)

## 2024-09-09 LAB — CBC
HCT: 31.8 % — ABNORMAL LOW (ref 39.0–52.0)
Hemoglobin: 10.4 g/dL — ABNORMAL LOW (ref 13.0–17.0)
MCH: 28.9 pg (ref 26.0–34.0)
MCHC: 32.7 g/dL (ref 30.0–36.0)
MCV: 88.3 fL (ref 80.0–100.0)
Platelets: 123 K/uL — ABNORMAL LOW (ref 150–400)
RBC: 3.6 MIL/uL — ABNORMAL LOW (ref 4.22–5.81)
RDW: 13 % (ref 11.5–15.5)
WBC: 8.2 K/uL (ref 4.0–10.5)
nRBC: 0 % (ref 0.0–0.2)

## 2024-09-09 MED ORDER — MAGNESIUM SULFATE 4 GM/100ML IV SOLN
4.0000 g | Freq: Once | INTRAVENOUS | Status: AC
  Administered 2024-09-09: 4 g via INTRAVENOUS
  Filled 2024-09-09: qty 100

## 2024-09-09 MED ORDER — METOPROLOL TARTRATE 12.5 MG HALF TABLET
12.5000 mg | ORAL_TABLET | Freq: Two times a day (BID) | ORAL | Status: DC
  Administered 2024-09-09 (×2): 12.5 mg via ORAL
  Filled 2024-09-09 (×2): qty 1

## 2024-09-09 MED ORDER — AMIODARONE LOAD VIA INFUSION
150.0000 mg | Freq: Once | INTRAVENOUS | Status: AC
  Administered 2024-09-09: 150 mg via INTRAVENOUS
  Filled 2024-09-09: qty 83.34

## 2024-09-09 MED ORDER — FUROSEMIDE 40 MG PO TABS
40.0000 mg | ORAL_TABLET | Freq: Every day | ORAL | Status: AC
  Administered 2024-09-09 – 2024-09-11 (×3): 40 mg via ORAL
  Filled 2024-09-09 (×3): qty 1

## 2024-09-09 MED ORDER — POTASSIUM CHLORIDE CRYS ER 20 MEQ PO TBCR
20.0000 meq | EXTENDED_RELEASE_TABLET | Freq: Once | ORAL | Status: AC
  Administered 2024-09-09: 20 meq via ORAL
  Filled 2024-09-09: qty 1

## 2024-09-09 MED ORDER — AMIODARONE HCL IN DEXTROSE 360-4.14 MG/200ML-% IV SOLN
30.0000 mg/h | INTRAVENOUS | Status: DC
  Administered 2024-09-09: 30 mg/h via INTRAVENOUS
  Filled 2024-09-09: qty 200
  Filled 2024-09-09: qty 400

## 2024-09-09 MED ORDER — POTASSIUM CHLORIDE CRYS ER 20 MEQ PO TBCR
20.0000 meq | EXTENDED_RELEASE_TABLET | Freq: Two times a day (BID) | ORAL | Status: DC
  Administered 2024-09-09 – 2024-09-10 (×3): 20 meq via ORAL
  Filled 2024-09-09 (×3): qty 1

## 2024-09-09 MED ORDER — AMIODARONE HCL IN DEXTROSE 360-4.14 MG/200ML-% IV SOLN
60.0000 mg/h | INTRAVENOUS | Status: AC
  Administered 2024-09-09 (×2): 60 mg/h via INTRAVENOUS

## 2024-09-09 NOTE — Progress Notes (Signed)
 "  NAME:  Cody Mcconnell, MRN:  [id number], DOB:  05-07-1946, LOS: 2 ADMISSION DATE:  09/07/2024, CONSULTATION DATE:  09/07/24 REFERRING MD:  Lucas  CHIEF COMPLAINT:  CAD  History of Present Illness:  Patient is a 79 year old male with significant past medical history of hypertension, hyperlipidemia, prediabetes proximal A-fib, moderate aortic stenosis, Meniere's disease, hearing loss, BPH, anxiety, seasonal allergies recent diagnosis of CAD who presents for elective CABG x 1 and AVR with left atrial appendage clipping by MD Bartel to address CAD and aortic stenosis.  PCCM consulted to assist with postop ventilator management as well as critical care needs while residing in ICU.  Total Pump Time: 112 X Clamp Time: 91 mins  EBL: ~150 Blood products: none Cell Saver: 436   Pertinent  Medical History   Past Medical History:  Diagnosis Date   Anemia    SLIGHT ANEMIA 4 MONTHS AGO   Aortic valve stenosis 12/19/2017   Echo 07/30/16 - EF 55-60, mild aortic stenosis (mean 9) // Echo 4/19:  EF 60-65 mild AS (mean 11) // Echocardiogram 11/21: EF 60-65, no RWMA, mild LVH, Gr 1 DD, normal RVSF, trivial MR, calcified AV, mod AS (mean 20 mmHg, Vmax 280 cm/s, DI 0.27), trivial AI  // Echo 5/22: EF 60-65, moderate LVH, no RWMA, trivial MR, moderate AS (mean gradient 20 mmHg, V-max 287 cm/s, DI 0.35)    Arthritis    OA   Benign prostate hyperplasia    unspecified whether lower urinary tract sym. present    Carotid atherosclerosis    Carotid US  4/19:  bilat ICA 1-39; vertebrals and subclavians normal   Carotid atherosclerosis, bilateral 04/30/2018   1-39% on 12/2017 Care Everywhere Echo   Coronary artery calcification seen on CT scan 06/05/2018   Dysrhythmia    ATRIAL FIB   Elevated prostate specific antigen (PSA) 08/30/2016   Glucose intolerance    pre-diabetic   Hayfever    Heart murmur    History of exercise stress test    GXT 4/19:  ETT with good exercise tolerance (10:00); no chest  pain; normal BP response; no diagnostic ST changes; negative adequate ETT.   HOH (hard of hearing)    BOTH EARS   Low back pain with sciatica 06/02/2012   Lumbar herniated disc 01/30/2018   Mild aortic stenosis 04/30/2018   12/2017 Care Everywhere Echo : Mean gradient (S): 11 mm Hg. Peak gradient (S): 24 mm Hg.   Numbness of right thumb    FROM CERVICAL NECK SURGERY   Osteoarthritis of right hip 09/21/2018   Overweight (BMI 25.0-29.9) 09/30/2018   Paroxysmal atrial fibrillation (HCC) 06/05/2018   Pre-diabetes    Prediabetes 04/30/2018   not sure about this   Prostatitis    unspecified prostatitis type   Pure hypercholesterolemia      Significant Hospital Events: Including procedures, antibiotic start and stop dates in addition to other pertinent events   12/30 CABG x 1, AVR with left atrial appendage clipping  Interim History / Subjective:  Feeling well. Pain is controlled. Eating, walking.  Tachycardic overnight, afebrile.   Objective    Blood pressure 98/63, pulse (!) 114, temperature 98 F (36.7 C), temperature source Oral, resp. rate 13, height 5' 9 (1.753 m), weight 81.8 kg, SpO2 98%.        Intake/Output Summary (Last 24 hours) at 09/09/2024 0657 Last data filed at 09/09/2024 0200 Gross per 24 hour  Intake 972.4 ml  Output 1560 ml  Net -587.6 ml  Filed Weights   09/07/24 0559 09/08/24 0500 09/09/24 0500  Weight: 79.5 kg 84 kg 81.8 kg    Examination: General: elderly man sitting up in the chair in NAD HEENT: [place]/AT. Eyes anicteric. CV: S1S2, tachycardic, irreg rhythm pulm: breathing comfortably on [place], reduced basilar breath sounds, no rhonchi Abs: soft, NT Extremities: mild LE edema  Neuro: awake & alert, moving all extremities, answering questions appropriately   BUN 20 Cr 0.92 WBC 8.2 H/H 10.4/31.8 Platelets 123 CXR personally reviewed  L base atelectasis, RIJ CVC  Resolved problem list   Assessment and Plan  Severe AS, s/p AVR 1-vessel CAD s/p  CABG x 1 ; LVEF 65-70% Remote h/o paroxysmal Afib; infrequent > now post-op Afib with RVR -hold AC unless persistent -amiodarone, empiric magnesium  -pain control per protocol- morphine , oxy, tramadol . Added toradol . -keep CVC for amio -lasix daily -ambulate, progress diet -daily aspirin , statin -con't metoprolol  -pulmonary hygiene    Hyperglycemia; h/o pre-DM. A1c 5.8 -SSI PRN-- minimal requirements   H/o meniere's disease -holding PTA methotrexate to allow healing  Severe back pain, chronic issue -toradol , lidocaine    Anxiety -con't PTA lexapro  DVT prophylaxis: lovenox  Labs   CBC: Recent Labs  Lab 09/07/24 1257 09/07/24 1258 09/07/24 1816 09/07/24 1817 09/08/24 0439 09/08/24 1704 09/09/24 0428  WBC 9.4  --  10.9*  --  9.6 10.2 8.2  HGB 11.9*   < > 11.4* 10.9* 10.9* 11.3* 10.4*  HCT 36.0*   < > 34.2* 32.0* 32.7* 34.1* 31.8*  MCV 86.3  --  85.7  --  85.8 85.9 88.3  PLT 155  --  169  --  148* 152 123*   < > = values in this interval not displayed.    Basic Metabolic Panel: Recent Labs  Lab 09/03/24 1140 09/07/24 0746 09/07/24 1134 09/07/24 1258 09/07/24 1816 09/07/24 1817 09/08/24 0439 09/08/24 1704 09/09/24 0428  NA 139   < > 139   < > 141 141 137 136 135  K 4.3   < > 4.6   < > 4.5 4.3 4.2 4.0 3.9  CL 106   < > 107  --  108  --  106 102 102  CO2 26  --   --   --  25  --  22 25 25   GLUCOSE 115*   < > 117*  --  122*  --  136* 134* 123*  BUN 17   < > 16  --  16  --  14 17 20   CREATININE 0.84   < > 0.70  --  0.84  --  0.81 0.94 0.92  CALCIUM  9.0  --   --   --  7.7*  --  7.3* 8.0* 7.9*  MG  --   --   --   --  3.2*  --  2.7* 2.5*  --   PHOS  --   --   --   --   --   --  3.1  --   --    < > = values in this interval not displayed.   GFR: Estimated Creatinine Clearance: 67.2 mL/min (by C-G formula based on SCr of 0.92 mg/dL). Recent Labs  Lab 09/07/24 1816 09/08/24 0439 09/08/24 1704 09/09/24 0428  WBC 10.9* 9.6 10.2 8.2    Liver Function  Tests: Recent Labs  Lab 09/03/24 1140  AST 20  ALT 20  ALKPHOS 74  BILITOT 0.4  PROT 6.7  ALBUMIN  4.3     Leita  SHAUNNA Gaskins, DO 09/09/2024 9:51 AM [facility]  For contact information, see Amion. If no response to pager, please call PCCM consult pager. After hours, 7PM- 7AM, please call Elink.       "

## 2024-09-09 NOTE — Progress Notes (Signed)
 2 Days Post-Op Procedures (LRB): CORONARY ARTERY BYPASS GRAFTING (CABG) TIMES ONE USING LEFT INTERNAL MAMMARY ARTERY (N/A) REPLACEMENT, AORTIC VALVE, OPEN USING 23 MM INSPIRIS RESILIA AORTIC VALVE (N/A) CLIPPING, LEFT ATRIAL APPENDAGE USING 40 MM ATRICLIP (N/A) ECHOCARDIOGRAM, TRANSESOPHAGEAL, INTRAOPERATIVE (N/A) Subjective: No complaints. Had a good night.  He did go into atrial fib with RVR 120's overnight.  Objective: Vital signs in last 24 hours: Temp:  [97.6 F (36.4 C)-98.2 F (36.8 C)] 98.2 F (36.8 C) (01/01 0700) Pulse Rate:  [74-117] 114 (01/01 0400) Cardiac Rhythm: Atrial fibrillation (01/01 0400) Resp:  [10-25] 13 (01/01 0400) BP: (83-151)/(52-94) 98/63 (01/01 0400) SpO2:  [90 %-99 %] 98 % (01/01 0400) Arterial Line BP: (120-157)/(47-63) 140/47 (12/31 1130) Weight:  [81.8 kg] 81.8 kg (01/01 0500)  Hemodynamic parameters for last 24 hours:    Intake/Output from previous day: 12/31 0701 - 01/01 0700 In: 972.4 [P.O.:540; I.V.:102.4; IV Piggyback:300] Out: 1595 [Urine:1545; Chest Tube:50] Intake/Output this shift: No intake/output data recorded.  General appearance: alert and cooperative Neurologic: intact Heart: irregularly irregular rhythm Lungs: clear to auscultation bilaterally Extremities: edema minimal Wound: chest dressing dry  Lab Results: Recent Labs    09/08/24 1704 09/09/24 0428  WBC 10.2 8.2  HGB 11.3* 10.4*  HCT 34.1* 31.8*  PLT 152 123*   BMET:  Recent Labs    09/08/24 1704 09/09/24 0428  NA 136 135  K 4.0 3.9  CL 102 102  CO2 25 25  GLUCOSE 134* 123*  BUN 17 20  CREATININE 0.94 0.92  CALCIUM  8.0* 7.9*    PT/INR:  Recent Labs    09/07/24 1257  LABPROT 18.5*  INR 1.5*   ABG    Component Value Date/Time   PHART 7.319 (L) 09/07/2024 1817   HCO3 23.8 09/07/2024 1817   TCO2 25 09/07/2024 1817   ACIDBASEDEF 2.0 09/07/2024 1817   O2SAT 97 09/07/2024 1817   CBG (last 3)  Recent Labs    09/08/24 2323 09/09/24 0317  09/09/24 0720  GLUCAP 131* 114* 121*   CXR: mild atelectasis left lower lung field  Assessment/Plan: S/P Procedures (LRB): CORONARY ARTERY BYPASS GRAFTING (CABG) TIMES ONE USING LEFT INTERNAL MAMMARY ARTERY (N/A) REPLACEMENT, AORTIC VALVE, OPEN USING 23 MM INSPIRIS RESILIA AORTIC VALVE (N/A) CLIPPING, LEFT ATRIAL APPENDAGE USING 40 MM ATRICLIP (N/A) ECHOCARDIOGRAM, TRANSESOPHAGEAL, INTRAOPERATIVE (N/A)  POD 2 Hemodynamically stable in atrial fib 120's. Amio started this am IV and will continue low dose Lopressor  as BP allows.  -622 cc yesterday. Wt down 5 lbs if accurate. Still 5 lbs over preop. Continue diuresis and KCL.  Will keep pacing wires in for now since starting amio.  Continue IS, ambulation.   LOS: 2 days    Cody Mcconnell 09/09/2024

## 2024-09-09 NOTE — Progress Notes (Signed)
 EVENING ROUNDS NOTE :     [address] [place] [employer] [address]             [phone]               2 Days Post-Op Procedures (LRB): CORONARY ARTERY BYPASS GRAFTING (CABG) TIMES ONE USING LEFT INTERNAL MAMMARY ARTERY (N/A) REPLACEMENT, AORTIC VALVE, OPEN USING 23 MM INSPIRIS RESILIA AORTIC VALVE (N/A) CLIPPING, LEFT ATRIAL APPENDAGE USING 40 MM ATRICLIP (N/A) ECHOCARDIOGRAM, TRANSESOPHAGEAL, INTRAOPERATIVE (N/A)   Total Length of Stay:  LOS: 2 days  Events:   Resting Appears back in sinus    BP (!) 111/91   Pulse 70   Temp 99.3 F (37.4 C) (Oral)   Resp (!) 23   Ht 5' 9 (1.753 m)   Wt 81.8 kg   SpO2 92%   BMI 26.63 kg/m          amiodarone 30 mg/hr (09/09/24 1500)    I/O last 3 completed shifts: In: 1535.4 [P.O.:540; I.V.:565.4; Other:30; IV Piggyback:400] Out: 2540 [Urine:2090; Chest Tube:450]      Latest Ref Rng & Units 09/09/2024    4:28 AM 09/08/2024    5:04 PM 09/08/2024    4:39 AM  CBC  WBC 4.0 - 10.5 K/uL 8.2  10.2  9.6   Hemoglobin 13.0 - 17.0 g/dL 89.5  88.6  89.0   Hematocrit 39.0 - 52.0 % 31.8  34.1  32.7   Platelets 150 - 400 K/uL 123  152  148        Latest Ref Rng & Units 09/09/2024    4:28 AM 09/08/2024    5:04 PM 09/08/2024    4:39 AM  BMP  Glucose 70 - 99 mg/dL 876  865  863   BUN 8 - 23 mg/dL 20  17  14    Creatinine 0.61 - 1.24 mg/dL 9.07  9.05  9.18   Sodium 135 - 145 mmol/L 135  136  137   Potassium 3.5 - 5.1 mmol/L 3.9  4.0  4.2   Chloride 98 - 111 mmol/L 102  102  106   CO2 22 - 32 mmol/L 25  25  22    Calcium  8.9 - 10.3 mg/dL 7.9  8.0  7.3     ABG    Component Value Date/Time   PHART 7.319 (L) 09/07/2024 1817   PCO2ART 46.5 09/07/2024 1817   PO2ART 101 09/07/2024 1817   HCO3 23.8 09/07/2024 1817   TCO2 25 09/07/2024 1817   ACIDBASEDEF 2.0 09/07/2024 1817   O2SAT 97 09/07/2024 1817       Linnie Rayas, MD 09/09/2024 3:48 PM

## 2024-09-09 NOTE — Plan of Care (Signed)

## 2024-09-09 NOTE — TOC Initial Note (Addendum)
 Transition of Care [facility]) - Initial/Assessment Note    Patient Details  Name: Cody Mcconnell MRN: [id number] Date of Birth: December 03, 1945  [facility] CM/SW Contact:    Sudie Erminio Deems, RN Phone Number: 09/09/2024, 1:33 PM  Clinical Narrative: Patient POD-2 CABG. PTA patient was independent from home with spouse. ICM spoke with spouse and she states patients bedroom is on the second floor. Patient has at least 12 steps to go up and spouse wants to make sure he can handle the steps before discharge. ICM did speak with the staff RN for a PT order for evaluation and recommendations. [facility] is following the patient per surgery protocol for [facility] needs. ICM has submitted to the CMA to schedule a PCP appointment within 14 days of discharge; information will be placed on the AVS. No needs identified for SDOH. Spouse will provide transportation home. ICM will continue to follow for additional needs as the patient progresses.    1409 09-09-24 Staff RN feels that patient is ambulating well and should be able to tolerate the steps in the home.  Expected Discharge Plan: Home w Home Health Services Barriers to Discharge: Continued Medical Work up   Patient Goals and CMS Choice Patient states their goals for this hospitalization and ongoing recovery are:: Plan is to return home with spouse (Goal to be able to go up stairs)  Expected Discharge Plan and Services In-house Referral: NA Discharge Planning Services: CM Consult Post Acute Care Choice: Home Health Living arrangements for the past 2 months: Post-Acute Facility   Prior Living Arrangements/Services Living arrangements for the past 2 months: Post-Acute Facility Lives with:: Spouse Patient language and need for interpreter reviewed:: Yes Do you feel safe going back to the place where you live?: Yes      Need for Family Participation in Patient Care: Yes (Comment) Care giver support system in place?: Yes (comment)    Criminal Activity/Legal Involvement Pertinent to Current Situation/Hospitalization: No - Comment as needed  Activities of Daily Living   ADL Screening (condition at time of admission) Independently performs ADLs?: Yes (appropriate for developmental age) Is the patient deaf or have difficulty hearing?: Yes Does the patient have difficulty seeing, even when wearing glasses/contacts?: No Does the patient have difficulty concentrating, remembering, or making decisions?: No  Permission Sought/Granted Permission sought to share information with : Family Supports, Case Manager   Emotional Assessment Appearance:: Appears stated age Attitude/Demeanor/Rapport: Engaged Affect (typically observed): Appropriate Orientation: : Oriented to Self, Oriented to Place, Oriented to  Time, Oriented to Situation Alcohol / Substance Use: Not Applicable Psych Involvement: No (comment)  Admission diagnosis:  Coronary artery disease involving native coronary artery of native heart without angina pectoris [I25.10] S/P aortic valve replacement with bioprosthetic valve [Z95.3] Patient Active Problem List   Diagnosis Date Noted   S/P aortic valve replacement with bioprosthetic valve 09/07/2024   S/P CABG x 1 09/07/2024   Cellulitis 04/07/2023   Hypophosphatemia 04/02/2023   Cellulitis of left lower extremity 04/01/2023   Atrial fibrillation with RVR (HCC) 04/01/2023   Rheumatoid arthritis (HCC) 04/01/2023   Bronchitis 04/01/2023   Hyperlipidemia 04/01/2023   AKI (acute kidney injury) 04/01/2023   Hypokalemia 04/01/2023   NSTEMI (non-ST elevated myocardial infarction) (HCC) 04/01/2023   Lumbar stenosis with neurogenic claudication 08/14/2021   Spondylolisthesis of lumbar region 06/29/2020   Overweight (BMI 25.0-29.9) 09/30/2018   S/P right THA, AA 09/29/2018   Osteoarthritis of right hip 09/21/2018   Paroxysmal atrial fibrillation (HCC) 06/05/2018  Coronary artery calcification seen on CT scan  06/05/2018   Carotid atherosclerosis, bilateral 04/30/2018   Prediabetes 04/30/2018   Mild aortic stenosis 04/30/2018   Acute right-sided low back pain with right-sided sciatica 02/04/2018   Lumbar herniated disc 01/30/2018   Aortic valve stenosis 12/19/2017   Encounter for screening colonoscopy 10/17/2017   Benign prostatic hyperplasia with urinary obstruction 08/30/2016   Elevated prostate specific antigen (PSA) 08/30/2016   History of lumbar laminectomy for spinal cord decompression 11/30/2013   S/P lumbar laminectomy 11/30/2013   Spinal stenosis of lumbar region with neurogenic claudication 09/20/2013   Right hip pain 09/20/2013   Low back pain with sciatica 06/02/2012   PCP:  Corlis Pagan, NP Pharmacy:   [id number] - [place], [place] - [address] AT [facility] [address] [facility] [address] [place] [address] Phone: [phone] Fax: [phone]  Social Drivers of Health (SDOH) Social History: SDOH Screenings   Food Insecurity: Low Risk (07/08/2024)   Received from [facility]  Housing: Low Risk (07/08/2024)   Received from [facility]  Transportation Needs: No Transportation Needs (07/08/2024)   Received from [facility]  Utilities: Low Risk (07/08/2024)   Received from [facility]  Alcohol Screen: Low Risk (04/02/2023)  Depression (PHQ2-9): Low Risk (11/14/2022)  Financial Resource Strain: Low Risk  (12/24/2021)   Received from [facility]  Physical Activity: Sufficiently Active (07/10/2023)   Received from [facility]  Social Connections: Moderately Isolated (04/02/2023)  Stress: No Stress Concern Present (04/02/2023)  Tobacco Use: Medium Risk (09/03/2024)  Health Literacy: Adequate Health Literacy (04/02/2023)   Readmission Risk Interventions     No data to display

## 2024-09-10 ENCOUNTER — Other Ambulatory Visit (HOSPITAL_COMMUNITY): Payer: Self-pay

## 2024-09-10 ENCOUNTER — Telehealth (HOSPITAL_COMMUNITY): Payer: Self-pay

## 2024-09-10 DIAGNOSIS — Z951 Presence of aortocoronary bypass graft: Secondary | ICD-10-CM | POA: Diagnosis not present

## 2024-09-10 DIAGNOSIS — I4891 Unspecified atrial fibrillation: Secondary | ICD-10-CM | POA: Diagnosis not present

## 2024-09-10 DIAGNOSIS — Z952 Presence of prosthetic heart valve: Secondary | ICD-10-CM | POA: Diagnosis not present

## 2024-09-10 DIAGNOSIS — I350 Nonrheumatic aortic (valve) stenosis: Secondary | ICD-10-CM | POA: Diagnosis not present

## 2024-09-10 DIAGNOSIS — Z953 Presence of xenogenic heart valve: Secondary | ICD-10-CM | POA: Diagnosis not present

## 2024-09-10 DIAGNOSIS — M549 Dorsalgia, unspecified: Secondary | ICD-10-CM | POA: Diagnosis not present

## 2024-09-10 DIAGNOSIS — G8929 Other chronic pain: Secondary | ICD-10-CM | POA: Diagnosis not present

## 2024-09-10 LAB — BASIC METABOLIC PANEL WITH GFR
Anion gap: 8 (ref 5–15)
BUN: 18 mg/dL (ref 8–23)
CO2: 25 mmol/L (ref 22–32)
Calcium: 7.9 mg/dL — ABNORMAL LOW (ref 8.9–10.3)
Chloride: 101 mmol/L (ref 98–111)
Creatinine, Ser: 0.76 mg/dL (ref 0.61–1.24)
GFR, Estimated: >60 mL/min
Glucose, Bld: 136 mg/dL — ABNORMAL HIGH (ref 70–99)
Potassium: 4.6 mmol/L (ref 3.5–5.1)
Sodium: 134 mmol/L — ABNORMAL LOW (ref 135–145)

## 2024-09-10 LAB — SURGICAL PATHOLOGY

## 2024-09-10 MED ORDER — AMIODARONE HCL 200 MG PO TABS
400.0000 mg | ORAL_TABLET | Freq: Two times a day (BID) | ORAL | Status: DC
  Administered 2024-09-10 – 2024-09-12 (×5): 400 mg via ORAL
  Filled 2024-09-10 (×5): qty 2

## 2024-09-10 MED ORDER — ~~LOC~~ CARDIAC SURGERY, PATIENT & FAMILY EDUCATION: Freq: Once | Status: DC

## 2024-09-10 MED ORDER — METOPROLOL TARTRATE 25 MG PO TABS
25.0000 mg | ORAL_TABLET | Freq: Two times a day (BID) | ORAL | Status: DC
  Administered 2024-09-10 – 2024-09-12 (×5): 25 mg via ORAL
  Filled 2024-09-10 (×5): qty 1

## 2024-09-10 MED ORDER — SODIUM CHLORIDE 0.9 % IV SOLN: 250.0000 mL | INTRAVENOUS | Status: AC | PRN

## 2024-09-10 MED ORDER — SODIUM CHLORIDE 0.9% FLUSH: 3.0000 mL | INTRAVENOUS | Status: DC | PRN

## 2024-09-10 MED ORDER — SODIUM CHLORIDE 0.9% FLUSH
3.0000 mL | Freq: Two times a day (BID) | INTRAVENOUS | Status: DC
  Administered 2024-09-10 – 2024-09-12 (×4): 3 mL via INTRAVENOUS

## 2024-09-10 MED ORDER — LACTULOSE 10 GM/15ML PO SOLN: 20.0000 g | Freq: Every day | ORAL | Status: DC | PRN

## 2024-09-10 NOTE — Plan of Care (Signed)
" °  Problem: Health Behavior/Discharge Planning: Goal: Ability to manage health-related needs will improve Outcome: Progressing   Problem: Clinical Measurements: Goal: Ability to maintain clinical measurements within normal limits will improve Outcome: Progressing Goal: Will remain free from infection 09/10/2024 0700 by Calayah Guadarrama, Dickey PARAS, RN Outcome: Progressing 09/10/2024 0700 by Layman Dickey PARAS, RN Outcome: Progressing Goal: Diagnostic test results will improve 09/10/2024 0700 by Ardean Melroy, Dickey PARAS, RN Outcome: Progressing 09/10/2024 0700 by Layman Dickey PARAS, RN Outcome: Progressing Goal: Respiratory complications will improve 09/10/2024 0700 by Gerry Blanchfield, Dickey PARAS, RN Outcome: Progressing 09/10/2024 0700 by Layman Dickey PARAS, RN Outcome: Progressing Goal: Cardiovascular complication will be avoided 09/10/2024 0700 by Jamey Demchak, Dickey PARAS, RN Outcome: Progressing 09/10/2024 0700 by Layman Dickey PARAS, RN Outcome: Progressing   Problem: Activity: Goal: Risk for activity intolerance will decrease 09/10/2024 0700 by Sherry Blackard, Dickey PARAS, RN Outcome: Progressing 09/10/2024 0700 by Layman Dickey PARAS, RN Outcome: Progressing   Problem: Nutrition: Goal: Adequate nutrition will be maintained Outcome: Progressing   "

## 2024-09-10 NOTE — Telephone Encounter (Signed)
 Pharmacy Patient Advocate Encounter  Insurance verification completed.    The patient is insured through [employer]. Patient has Medicare and is not eligible for a copay card, but may be able to apply for patient assistance or Medicare RX Payment Plan (Patient Must reach out to their plan, if eligible for payment plan), if available.    Ran test claim for Eliquis  5mg  tablet and the current 30 day co-pay is $259.20.  Ran test claim for Xarelto  20mg  tablet and the current 30 day co-pay is $216.93  This test claim was processed through [employer]- copay amounts may vary at other pharmacies due to [employer], or as the patient moves through the different stages of their insurance plan.

## 2024-09-10 NOTE — TOC Progression Note (Signed)
 Transition of Care [facility]) - Progression Note    Patient Details  Name: Cody Mcconnell MRN: [id number] Date of Birth: Nov 10, 1945  [facility] CM/SW Contact  Graves-Bigelow, Erminio Deems, RN Phone Number: 09/10/2024, 10:29 AM  Clinical Narrative:  PCP appointment has been scheduled and information placed on the AVS. No further needs identified at this time. ICM will continue to follow for additional needs as the patient progresses.   Expected Discharge Plan: Home w Home Health Services Barriers to Discharge: Continued Medical Work up  Expected Discharge Plan and Services In-house Referral: NA Discharge Planning Services: CM Consult Post Acute Care Choice: Home Health Living arrangements for the past 2 months: Post-Acute Facility  Social Drivers of Health (SDOH) Interventions SDOH Screenings   Food Insecurity: Low Risk (07/08/2024)   Received from [facility]  Housing: Low Risk (07/08/2024)   Received from [facility]  Transportation Needs: No Transportation Needs (07/08/2024)   Received from [facility]  Utilities: Low Risk (07/08/2024)   Received from [facility]  Alcohol Screen: Low Risk (04/02/2023)  Depression (PHQ2-9): Low Risk (11/14/2022)  Financial Resource Strain: Low Risk  (12/24/2021)   Received from [facility]  Physical Activity: Sufficiently Active (07/10/2023)   Received from [facility]  Social Connections: Moderately Isolated (04/02/2023)  Stress: No Stress Concern Present (04/02/2023)  Tobacco Use: Medium Risk (09/03/2024)  Health Literacy: Adequate Health Literacy (04/02/2023)    Readmission Risk Interventions     No data to display

## 2024-09-10 NOTE — Progress Notes (Signed)
 Patient arrived to 4E, oriented to unit, and CCMD notified. BP (!) 121/92 (BP Location: Left Arm)   Pulse 63   Temp 97.9 F (36.6 C) (Oral)   Resp 20   Ht 5' 9 (1.753 m)   Wt 83.4 kg   SpO2 99%   BMI 27.15 kg/m

## 2024-09-10 NOTE — Progress Notes (Signed)
 "  NAME:  Cody Mcconnell, MRN:  [id number], DOB:  [birth year], LOS: 3 ADMISSION DATE:  09/07/2024, CONSULTATION DATE:  09/07/24 REFERRING MD:  Lucas  CHIEF COMPLAINT:  CAD  History of Present Illness:  Patient is a 79 year old male with significant past medical history of hypertension, hyperlipidemia, prediabetes proximal A-fib, moderate aortic stenosis, Meniere's disease, hearing loss, BPH, anxiety, seasonal allergies recent diagnosis of CAD who presents for elective CABG x 1 and AVR with left atrial appendage clipping by MD Bartel to address CAD and aortic stenosis.  PCCM consulted to assist with postop ventilator management as well as critical care needs while residing in ICU.  Total Pump Time: 112 X Clamp Time: 91 mins  EBL: ~150 Blood products: none Cell Saver: 436   Pertinent  Medical History   Past Medical History:  Diagnosis Date   Anemia    SLIGHT ANEMIA 4 MONTHS AGO   Aortic valve stenosis 12/19/2017   Echo 07/30/16 - EF 55-60, mild aortic stenosis (mean 9) // Echo 4/19:  EF 60-65 mild AS (mean 11) // Echocardiogram 11/21: EF 60-65, no RWMA, mild LVH, Gr 1 DD, normal RVSF, trivial MR, calcified AV, mod AS (mean 20 mmHg, Vmax 280 cm/s, DI 0.27), trivial AI  // Echo 5/22: EF 60-65, moderate LVH, no RWMA, trivial MR, moderate AS (mean gradient 20 mmHg, V-max 287 cm/s, DI 0.35)    Arthritis    OA   Benign prostate hyperplasia    unspecified whether lower urinary tract sym. present    Carotid atherosclerosis    Carotid US  4/19:  bilat ICA 1-39; vertebrals and subclavians normal   Carotid atherosclerosis, bilateral 04/30/2018   1-39% on 12/2017 Care Everywhere Echo   Coronary artery calcification seen on CT scan 06/05/2018   Dysrhythmia    ATRIAL FIB   Elevated prostate specific antigen (PSA) 08/30/2016   Glucose intolerance    pre-diabetic   Hayfever    Heart murmur    History of exercise stress test    GXT 4/19:  ETT with good exercise tolerance (10:00); no chest  pain; normal BP response; no diagnostic ST changes; negative adequate ETT.   HOH (hard of hearing)    BOTH EARS   Low back pain with sciatica 06/02/2012   Lumbar herniated disc 01/30/2018   Mild aortic stenosis 04/30/2018   12/2017 Care Everywhere Echo : Mean gradient (S): 11 mm Hg. Peak gradient (S): 24 mm Hg.   Numbness of right thumb    FROM CERVICAL NECK SURGERY   Osteoarthritis of right hip 09/21/2018   Overweight (BMI 25.0-29.9) 09/30/2018   Paroxysmal atrial fibrillation (HCC) 06/05/2018   Pre-diabetes    Prediabetes 04/30/2018   not sure about this   Prostatitis    unspecified prostatitis type   Pure hypercholesterolemia      Significant Hospital Events: Including procedures, antibiotic start and stop dates in addition to other pertinent events   12/30 CABG x 1, AVR with left atrial appendage clipping 1/1 Afib with RVR; cardioverted out with amio  Interim History / Subjective:  Remains out of Afib this morning. Denies complaints. Eating and walking well.  Wife is at bedside and is concerned about falls he was having prior to admission.  They were both asking about walker options.  Objective    Blood pressure (!) 140/67, pulse 66, temperature 97.6 F (36.4 C), temperature source Axillary, resp. rate 19, height 5' 9 (1.753 m), weight 83.4 kg, SpO2 95%.  Intake/Output Summary (Last 24 hours) at 09/10/2024 0708 Last data filed at 09/10/2024 0700 Gross per 24 hour  Intake 702.85 ml  Output 800 ml  Net -97.15 ml   Filed Weights   09/08/24 0500 09/09/24 0500 09/10/24 0615  Weight: 84 kg 81.8 kg 83.4 kg    Examination: General: elderly man lying in bed in NAD HEENT: [place]/AT, eyes anicteric CV:  S1S2, RRR pulm: Breathing comfortably on room air, lying flat bed.  Clear to auscultation bilaterally.  No conversational dyspnea.   Abs: Soft, nontender, nondistended Extremities: Minimal edema Neuro: Awake and alert, answering questions appropriately. Moving all  extremities.  Na+  134 BUN 18 Cr 0.76  Resolved problem list   Assessment and Plan  Severe AS, s/p AVR 1-vessel CAD s/p CABG x 1 ; LVEF 65-70% Remote h/o paroxysmal Afib; infrequent > now post-op Afib with RVR - Convert to oral amiodarone - Holding anticoagulation unless persistent A-fib - Monitor electrolytes and replete as needed - Discontinue central line - Daily Lasix - Ambulate, continue to progress diet -Aspirin  and statin -amiodarone, empiric magnesium  -pain control per protocol- morphine , oxy, tramadol . Added toradol . -keep CVC for amio -lasix daily -ambulate, progress diet -daily aspirin , statin -con't metoprolol  BID   h/o pre-DM. A1c 5.8 -Not requiring insulin  - Recommend outpatient follow-up with PCP for monitoring   H/o meniere's disease - Continue holding PTA methotrexate to facilitate tissue healing  Severe back pain, chronic issue Falls PTA  -toradol , lidocaine  patch -PT consulted  Anxiety - Continue PTA Lexapro  DVT prophylaxis: Ambulation  Transferring to the floor.  PCCM will be available as needed.  Labs   CBC: Recent Labs  Lab 09/07/24 1257 09/07/24 1258 09/07/24 1816 09/07/24 1817 09/08/24 0439 09/08/24 1704 09/09/24 0428  WBC 9.4  --  10.9*  --  9.6 10.2 8.2  HGB 11.9*   < > 11.4* 10.9* 10.9* 11.3* 10.4*  HCT 36.0*   < > 34.2* 32.0* 32.7* 34.1* 31.8*  MCV 86.3  --  85.7  --  85.8 85.9 88.3  PLT 155  --  169  --  148* 152 123*   < > = values in this interval not displayed.    Basic Metabolic Panel: Recent Labs  Lab 09/07/24 1816 09/07/24 1817 09/08/24 0439 09/08/24 1704 09/09/24 0428 09/10/24 0426  NA 141 141 137 136 135 134*  K 4.5 4.3 4.2 4.0 3.9 4.6  CL 108  --  106 102 102 101  CO2 25  --  22 25 25 25   GLUCOSE 122*  --  136* 134* 123* 136*  BUN 16  --  14 17 20 18   CREATININE 0.84  --  0.81 0.94 0.92 0.76  CALCIUM  7.7*  --  7.3* 8.0* 7.9* 7.9*  MG 3.2*  --  2.7* 2.5*  --   --   PHOS  --   --  3.1  --   --   --     GFR: Estimated Creatinine Clearance: 77.3 mL/min (by C-G formula based on SCr of 0.76 mg/dL). Recent Labs  Lab 09/07/24 1816 09/08/24 0439 09/08/24 1704 09/09/24 0428  WBC 10.9* 9.6 10.2 8.2    Liver Function Tests: Recent Labs  Lab 09/03/24 1140  AST 20  ALT 20  ALKPHOS 74  BILITOT 0.4  PROT 6.7  ALBUMIN  4.3     Leita SHAUNNA Gaskins, DO 09/10/2024 2:34 PM [facility]  For contact information, see Amion. If no response to pager, please call  PCCM consult pager. After hours, 7PM- 7AM, please call Elink.       "

## 2024-09-10 NOTE — Progress Notes (Signed)
 3 Days Post-Op Procedures (LRB): CORONARY ARTERY BYPASS GRAFTING (CABG) TIMES ONE USING LEFT INTERNAL MAMMARY ARTERY (N/A) REPLACEMENT, AORTIC VALVE, OPEN USING 23 MM INSPIRIS RESILIA AORTIC VALVE (N/A) CLIPPING, LEFT ATRIAL APPENDAGE USING 40 MM ATRICLIP (N/A) ECHOCARDIOGRAM, TRANSESOPHAGEAL, INTRAOPERATIVE (N/A) Subjective: No complaints. Back pain has been much better.   Went back into NSR on IV amio.  Objective: Vital signs in last 24 hours: Temp:  [97.6 F (36.4 C)-99.3 F (37.4 C)] 97.6 F (36.4 C) (01/02 0400) Pulse Rate:  [60-127] 66 (01/02 0700) Cardiac Rhythm: Normal sinus rhythm (01/02 0430) Resp:  [12-29] 19 (01/02 0700) BP: (95-158)/(60-95) 140/67 (01/02 0700) SpO2:  [91 %-96 %] 95 % (01/02 0700) Weight:  [83.4 kg] 83.4 kg (01/02 0615)  Hemodynamic parameters for last 24 hours:    Intake/Output from previous day: 01/01 0701 - 01/02 0700 In: 702.9 [I.V.:602.9; IV Piggyback:100] Out: 800 [Urine:800] Intake/Output this shift: No intake/output data recorded.  General appearance: alert and cooperative Neurologic: intact Heart: regular rate and rhythm Lungs: clear to auscultation bilaterally Extremities: no edema Wound: incision healing well  Lab Results: Recent Labs    09/08/24 1704 09/09/24 0428  WBC 10.2 8.2  HGB 11.3* 10.4*  HCT 34.1* 31.8*  PLT 152 123*   BMET:  Recent Labs    09/09/24 0428 09/10/24 0426  NA 135 134*  K 3.9 4.6  CL 102 101  CO2 25 25  GLUCOSE 123* 136*  BUN 20 18  CREATININE 0.92 0.76  CALCIUM  7.9* 7.9*    PT/INR:  Recent Labs    09/07/24 1257  LABPROT 18.5*  INR 1.5*   ABG    Component Value Date/Time   PHART 7.319 (L) 09/07/2024 1817   HCO3 23.8 09/07/2024 1817   TCO2 25 09/07/2024 1817   ACIDBASEDEF 2.0 09/07/2024 1817   O2SAT 97 09/07/2024 1817   CBG (last 3)  Recent Labs    09/09/24 0317 09/09/24 0720 09/09/24 1112  GLUCAP 114* 121* 138*    Assessment/Plan: S/P Procedures (LRB): CORONARY  ARTERY BYPASS GRAFTING (CABG) TIMES ONE USING LEFT INTERNAL MAMMARY ARTERY (N/A) REPLACEMENT, AORTIC VALVE, OPEN USING 23 MM INSPIRIS RESILIA AORTIC VALVE (N/A) CLIPPING, LEFT ATRIAL APPENDAGE USING 40 MM ATRICLIP (N/A) ECHOCARDIOGRAM, TRANSESOPHAGEAL, INTRAOPERATIVE (N/A)  POD 3  Hemodynamically stable in NSR on IV amio. Will convert to po. Increase Lopressor  to 25 bid.  DC pacing wires then sleeve if stable.  Transfer to 4E and continue mobilization.  Anticipate home probably Sunday.   LOS: 3 days    Dorise MARLA Fellers 09/10/2024

## 2024-09-11 LAB — BASIC METABOLIC PANEL WITH GFR
Anion gap: 9 (ref 5–15)
BUN: 19 mg/dL (ref 8–23)
CO2: 26 mmol/L (ref 22–32)
Calcium: 8.5 mg/dL — ABNORMAL LOW (ref 8.9–10.3)
Chloride: 102 mmol/L (ref 98–111)
Creatinine, Ser: 0.95 mg/dL (ref 0.61–1.24)
GFR, Estimated: >60 mL/min
Glucose, Bld: 138 mg/dL — ABNORMAL HIGH (ref 70–99)
Potassium: 4.6 mmol/L (ref 3.5–5.1)
Sodium: 137 mmol/L (ref 135–145)

## 2024-09-11 LAB — CBC
HCT: 35.2 % — ABNORMAL LOW (ref 39.0–52.0)
Hemoglobin: 11.4 g/dL — ABNORMAL LOW (ref 13.0–17.0)
MCH: 28.3 pg (ref 26.0–34.0)
MCHC: 32.4 g/dL (ref 30.0–36.0)
MCV: 87.3 fL (ref 80.0–100.0)
Platelets: 210 K/uL (ref 150–400)
RBC: 4.03 MIL/uL — ABNORMAL LOW (ref 4.22–5.81)
RDW: 13 % (ref 11.5–15.5)
WBC: 9.3 K/uL (ref 4.0–10.5)
nRBC: 0 % (ref 0.0–0.2)

## 2024-09-11 NOTE — Evaluation (Signed)
 Physical Therapy Evaluation Patient Details Name: Cody Mcconnell MRN: [id number] DOB: Nov 10, 1945 Today's Date: 09/11/2024  History of Present Illness  The pt is a 79 yo male presenting 12/30 for CABG x1, AVR, and clipping of L atrial appendage. PMH includes: aortic stenosis, HTN, afib, multiple spinal and cervical surgeries, and R THA.  Clinical Impression  Pt in bed upon arrival of PT, agreeable to evaluation at this time. Prior to admission the pt was completely independent without need for DME, living in a 2-story home, and working. The pt does report 2 recent falls without injury (tripping over root while walking outside, and tripping on a curb while getting out of his car). The pt was able to complete sit-stand transfer without assistance, benefits from use of single UE support for stair navigation, and was able to ambulate without DME when balance unchallenged. However, pt demos poor tolerance for balance challenge, demos increased sway and drifting with head turns and quick turns, as well as limited reactive balance. Pt with improvement with use of UE support via rollator, and will benefit from this DME for community mobility, as well as OPPT for further balance training to reduce risk of further falls.  Dynamic Gait Index (DGI): 18/24 (<19 indicates increased risk for falls)        If plan is discharge home, recommend the following: A little help with walking and/or transfers;Help with stairs or ramp for entrance   Can travel by private vehicle        Equipment Recommendations Rollator (4 wheels)  Recommendations for Other Services       Functional Status Assessment Patient has had a recent decline in their functional status and demonstrates the ability to make significant improvements in function in a reasonable and predictable amount of time.     Precautions / Restrictions Precautions Precautions: Fall;Sternal Recall of Precautions/Restrictions: Intact Restrictions Weight  Bearing Restrictions Per Provider Order: No Other Position/Activity Restrictions: cardiac sternal precautions      Mobility  Bed Mobility Overal bed mobility: Needs Assistance Bed Mobility: Rolling, Sidelying to Sit Rolling: Contact guard assist Sidelying to sit: Min assist       General bed mobility comments: cues for positioning for log roll, minA to elevate trunk    Transfers Overall transfer level: Needs assistance Equipment used: None Transfers: Sit to/from Stand Sit to Stand: Supervision           General transfer comment: holding heart pillow, no assist needed to power up    Ambulation/Gait Ambulation/Gait assistance: Contact guard assist, Supervision Gait Distance (Feet): 350 Feet Assistive device: None, Rollator (4 wheels) Gait Pattern/deviations: Step-through pattern, Decreased stride length, Drifts right/left Gait velocity: WFL Gait velocity interpretation: 1.31 - 2.62 ft/sec, indicative of limited community ambulator   General Gait Details: pt with good stability without challenge, increased drift with challenge, stable with and without rollator when unchallenged  Stairs Stairs: Yes Stairs assistance: Contact guard assist Stair Management: One rail Left, Alternating pattern, Forwards Number of Stairs: 10       Balance Overall balance assessment: Needs assistance, History of Falls Sitting-balance support: No upper extremity supported, Feet supported Sitting balance-Leahy Scale: Good     Standing balance support: No upper extremity supported, During functional activity Standing balance-Leahy Scale: Fair Standing balance comment: poor tolerance for balance challenge without UE support, improved with UE support                 Standardized Balance Assessment Standardized Balance Assessment : Dynamic Gait Index  Dynamic Gait Index Level Surface: Normal Change in Gait Speed: Normal Gait with Horizontal Head Turns: Moderate Impairment Gait  with Vertical Head Turns: Mild Impairment Gait and Pivot Turn: Mild Impairment Step Over Obstacle: Mild Impairment Step Around Obstacles: Normal Steps: Mild Impairment Total Score: 18       Pertinent Vitals/Pain Pain Assessment Pain Assessment: Faces Faces Pain Scale: Hurts a little bit Pain Location: low back (chronic) Pain Descriptors / Indicators: Discomfort Pain Intervention(s): Limited activity within patient's tolerance, Monitored during session, Repositioned, Ice applied    Home Living Family/patient expects to be discharged to:: Private residence Living Arrangements: Spouse/significant other Available Help at Discharge: Family;Available 24 hours/day Type of Home: House Home Access: Stairs to enter Entrance Stairs-Rails: Right;Left Entrance Stairs-Number of Steps: 2 Alternate Level Stairs-Number of Steps: flight Home Layout: Two level;Bed/bath upstairs Home Equipment: Rolling Walker (2 wheels);Toilet riser      Prior Function Prior Level of Function : Independent/Modified Independent;Working/employed;Driving;History of Falls (last six months) (2 falls recently)             Mobility Comments: typically active, walking in neighborhood but 2 recent falls, tripped on root ADLs Comments: independent     Extremity/Trunk Assessment   Upper Extremity Assessment Upper Extremity Assessment: Overall WFL for tasks assessed    Lower Extremity Assessment Lower Extremity Assessment: Overall WFL for tasks assessed    Cervical / Trunk Assessment Cervical / Trunk Assessment: Kyphotic;Other exceptions Cervical / Trunk Exceptions: s/p median sternotomy  Communication   Communication Communication: Impaired Factors Affecting Communication: Hearing impaired (better hearing in R ear)    Cognition Arousal: Alert Behavior During Therapy: WFL for tasks assessed/performed   PT - Cognitive impairments: Safety/Judgement                       PT - Cognition  Comments: pt with mildly decreased awareness of safety and planning for activity after d/c home, mild impulsivity but able to answer all questions and recall all sternal precatuions without cues Following commands: Intact       Cueing Cueing Techniques: Verbal cues     General Comments      Exercises     Assessment/Plan    PT Assessment Patient needs continued PT services  PT Problem List Decreased strength;Decreased activity tolerance;Decreased balance;Decreased mobility       PT Treatment Interventions DME instruction;Gait training;Functional mobility training;Stair training;Therapeutic activities;Therapeutic exercise;Balance training    PT Goals (Current goals can be found in the Care Plan section)  Acute Rehab PT Goals Patient Stated Goal: to stop falling PT Goal Formulation: With patient/family Time For Goal Achievement: 09/25/24 Potential to Achieve Goals: Good    Frequency Min 2X/week        AM-PAC PT 6 Clicks Mobility  Outcome Measure Help needed turning from your back to your side while in a flat bed without using bedrails?: None Help needed moving from lying on your back to sitting on the side of a flat bed without using bedrails?: A Little Help needed moving to and from a bed to a chair (including a wheelchair)?: None Help needed standing up from a chair using your arms (e.g., wheelchair or bedside chair)?: None Help needed to walk in hospital room?: A Little Help needed climbing 3-5 steps with a railing? : A Little 6 Click Score: 21    End of Session Equipment Utilized During Treatment: Gait belt Activity Tolerance: Patient tolerated treatment well Patient left: in bed;with call bell/phone within reach;with family/visitor present (  sitting EOB) Nurse Communication: Mobility status PT Visit Diagnosis: Unsteadiness on feet (R26.81);Repeated falls (R29.6)    Time: 9164-9081 PT Time Calculation (min) (ACUTE ONLY): 43 min   Charges:   PT Evaluation $PT  Eval Low Complexity: 1 Low PT Treatments $Gait Training: 8-22 mins $Therapeutic Exercise: 8-22 mins PT General Charges $$ ACUTE PT VISIT: 1 Visit         Izetta Call, PT, DPT   Acute Rehabilitation Department Office [phone] Secure Chat Communication Preferred  Izetta JULIANNA Call 09/11/2024, 10:09 AM

## 2024-09-11 NOTE — Progress Notes (Signed)
 CARDIAC REHAB PHASE I   PRE:  Rate/Rhythm: NSR, 68bpm  BP:  Lying: 140/69    Sitting: 132/76      SaO2: 97%  MODE:  Ambulation: 940 ft   POST:  Rate/Rhythm: NSR, 71 bpm  BP:  Lying: 148/78      SaO2: 98%  Post OHS education including site care, restrictions, heart healthy diabetic diet, sternal precautions, IS use at home, exercise guidelines, and CRP2 reviewed. All questions and concerns addressed. Will refer to [facility] for CRP2.   Fairy JONETTA Music, RN BSN 09/11/2024 11:59 AM [phone]

## 2024-09-11 NOTE — Progress Notes (Addendum)
"  [address] [place] [address]             [phone]  4 Days Post-Op Procedures (LRB): CORONARY ARTERY BYPASS GRAFTING (CABG) TIMES ONE USING LEFT INTERNAL MAMMARY ARTERY (N/A) REPLACEMENT, AORTIC VALVE, OPEN USING 23 MM INSPIRIS RESILIA AORTIC VALVE (N/A) CLIPPING, LEFT ATRIAL APPENDAGE USING 40 MM ATRICLIP (N/A) ECHOCARDIOGRAM, TRANSESOPHAGEAL, INTRAOPERATIVE (N/A) Subjective: Transferred from 4E yesterday. Feels good, no new concerns. Remains in sinus rhythm.  On room air  Objective: Vital signs in last 24 hours: Temp:  [97.9 F (36.6 C)-98.2 F (36.8 C)] 98.2 F (36.8 C) (01/03 0400) Pulse Rate:  [61-71] 65 (01/03 0400) Cardiac Rhythm: Normal sinus rhythm (01/03 0700) Resp:  [11-22] 18 (01/03 0400) BP: (119-149)/(67-108) 130/68 (01/03 0400) SpO2:  [92 %-100 %] 100 % (01/03 0400) Weight:  [82 kg] 82 kg (01/03 0400)     Intake/Output from previous day: 01/02 0701 - 01/03 0700 In: 273.9 [P.O.:240; I.V.:33.9] Out: 1175 [Urine:1175] Intake/Output this shift: No intake/output data recorded.  General appearance: alert, cooperative, and no distress Neurologic: intact Heart: Regular rate and rhythm, no further atrial fibrillation. Lungs: Normal respiratory effort on room air.  Breath sounds are clear to auscultation. Abdomen: Soft, no tenderness Extremities: No peripheral edema. Wound: The sternotomy incision is well-approximated and dry.  Lab Results: Recent Labs    09/09/24 0428 09/11/24 0415  WBC 8.2 9.3  HGB 10.4* 11.4*  HCT 31.8* 35.2*  PLT 123* 210   BMET:  Recent Labs    09/10/24 0426 09/11/24 0415  NA 134* 137  K 4.6 4.6  CL 101 102  CO2 25 26  GLUCOSE 136* 138*  BUN 18 19  CREATININE 0.76 0.95  CALCIUM  7.9* 8.5*    PT/INR: No results for input(s): LABPROT, INR in the last 72 hours. ABG    Component Value Date/Time   PHART 7.319 (L) 09/07/2024 1817   HCO3 23.8 09/07/2024 1817   TCO2 25 09/07/2024 1817    ACIDBASEDEF 2.0 09/07/2024 1817   O2SAT 97 09/07/2024 1817   CBG (last 3)  Recent Labs    09/09/24 0317 09/09/24 0720 09/09/24 1112  GLUCAP 114* 121* 138*    Assessment/Plan: S/P Procedures (LRB): CORONARY ARTERY BYPASS GRAFTING (CABG) TIMES ONE USING LEFT INTERNAL MAMMARY ARTERY (N/A) REPLACEMENT, AORTIC VALVE, OPEN USING 23 MM INSPIRIS RESILIA AORTIC VALVE (N/A) CLIPPING, LEFT ATRIAL APPENDAGE USING 40 MM ATRICLIP (N/A) ECHOCARDIOGRAM, TRANSESOPHAGEAL, INTRAOPERATIVE (N/A)  - Stable vital signs and cardiac rhythm.  On metoprolol  25 mg twice daily, rosuvastatin , and aspirin .  - Postoperative atrial fibrillation: Converted back to sinus rhythm on amiodarone .  Continue with 400 mg twice daily and will taper in the outpatient setting.  K+ 4.6.  Left atrial appendage was clipped at the time of surgery.  -Chronic back pain: Control has been much better over the past few days  -Heme: Mild expected acute blood loss anemia.  Hematocrit is trending up.  -Renal: Normal function.  Weight is about 5lbs over preop.  Continue Lasix  40 mg daily.  -GI: Not much appetite but tolerating p.o.'s okay.  Has had bowel movement since surgery.  -Disposition: Progressing well postop day 4 CABG x 1 and bioprosthetic AVR.  Anticipate discharge to home tomorrow.    LOS: 4 days    Myron G. Roddenberry, PA-C 09/11/2024  Patient seen and examined, agree with above Looks great Probably home tomorrow  Elspeth BROCKS. Kerrin, MD [facility] [phone])  [phone] ° °"

## 2024-09-12 ENCOUNTER — Other Ambulatory Visit (HOSPITAL_COMMUNITY): Payer: Self-pay

## 2024-09-12 LAB — BASIC METABOLIC PANEL WITH GFR
Anion gap: 9 (ref 5–15)
BUN: 16 mg/dL (ref 8–23)
CO2: 26 mmol/L (ref 22–32)
Calcium: 8.6 mg/dL — ABNORMAL LOW (ref 8.9–10.3)
Chloride: 101 mmol/L (ref 98–111)
Creatinine, Ser: 0.91 mg/dL (ref 0.61–1.24)
GFR, Estimated: >60 mL/min
Glucose, Bld: 197 mg/dL — ABNORMAL HIGH (ref 70–99)
Potassium: 4.2 mmol/L (ref 3.5–5.1)
Sodium: 136 mmol/L (ref 135–145)

## 2024-09-12 LAB — MAGNESIUM: Magnesium: 1.9 mg/dL (ref 1.7–2.4)

## 2024-09-12 MED ORDER — OXYCODONE HCL 5 MG PO TABS
5.0000 mg | ORAL_TABLET | ORAL | 0 refills | Status: AC | PRN
  Filled 2024-09-12: qty 30, 2d supply, fill #0

## 2024-09-12 MED ORDER — ASPIRIN 81 MG PO TBEC: 81.0000 mg | DELAYED_RELEASE_TABLET | Freq: Every day | ORAL | Status: DC

## 2024-09-12 MED ORDER — AMIODARONE IV BOLUS ONLY 150 MG/100ML
150.0000 mg | Freq: Once | INTRAVENOUS | Status: AC
  Administered 2024-09-12: 150 mg via INTRAVENOUS
  Filled 2024-09-12: qty 100

## 2024-09-12 MED ORDER — AMIODARONE HCL 200 MG PO TABS
400.0000 mg | ORAL_TABLET | Freq: Two times a day (BID) | ORAL | 1 refills | Status: DC
  Filled 2024-09-12: qty 60, 25d supply, fill #0

## 2024-09-12 MED ORDER — RIVAROXABAN 20 MG PO TABS
20.0000 mg | ORAL_TABLET | Freq: Every day | ORAL | 2 refills | Status: AC
  Filled 2024-09-12: qty 30, 30d supply, fill #0

## 2024-09-12 MED ORDER — MAGNESIUM SULFATE 2 GM/50ML IV SOLN
2.0000 g | Freq: Once | INTRAVENOUS | Status: AC
  Administered 2024-09-12: 2 g via INTRAVENOUS
  Filled 2024-09-12: qty 50

## 2024-09-12 MED ORDER — METOPROLOL TARTRATE 25 MG PO TABS
25.0000 mg | ORAL_TABLET | Freq: Two times a day (BID) | ORAL | 5 refills | Status: DC
  Filled 2024-09-12: qty 60, 30d supply, fill #0

## 2024-09-12 NOTE — TOC Transition Note (Addendum)
 Transition of Care [facility]) - Discharge Note   Patient Details  Name: Cody Mcconnell MRN: [id number] Date of Birth: December 14, 1945  Transition of Care [facility]) CM/SW Contact:  Robynn Eileen Hoose, RN Phone Number: 09/12/2024, 2:31 PM   Clinical Narrative:   Patient is being discharged today. Therapy recommendations for rollator noted. Order sent to Jermaine with [employer], awaiting response.  1459: Rollator to be delivered to patient bedside prior to discharging home.       Barriers to Discharge: Continued Medical Work up   Patient Goals and CMS Choice Patient states their goals for this hospitalization and ongoing recovery are:: Plan is to return home with spouse (Goal to be able to go up stairs)          Discharge Placement                       Discharge Plan and Services Additional resources added to the After Visit Summary for   In-house Referral: NA Discharge Planning Services: CM Consult Post Acute Care Choice: Home Health                               Social Drivers of Health (SDOH) Interventions SDOH Screenings   Food Insecurity: No Food Insecurity (09/10/2024)  Housing: Low Risk (09/10/2024)  Transportation Needs: No Transportation Needs (09/10/2024)  Utilities: Not At Risk (09/10/2024)  Alcohol Screen: Low Risk (04/02/2023)  Depression (PHQ2-9): Low Risk (11/14/2022)  Financial Resource Strain: Low Risk  (12/24/2021)   Received from [facility]  Physical Activity: Sufficiently Active (07/10/2023)   [facility] from [facility]  Social Connections: Moderately Isolated (09/10/2024)  Stress: No Stress Concern Present (04/02/2023)  Tobacco Use: Medium Risk (09/03/2024)  Health Literacy: Adequate Health Literacy (04/02/2023)     Readmission Risk Interventions     No data to display

## 2024-09-12 NOTE — Progress Notes (Addendum)
 "  [address], [place] [address]             [phone]  5 Days Post-Op Procedures (LRB): CORONARY ARTERY BYPASS GRAFTING (CABG) TIMES ONE USING LEFT INTERNAL MAMMARY ARTERY (N/A) REPLACEMENT, AORTIC VALVE, OPEN USING 23 MM INSPIRIS RESILIA AORTIC VALVE (N/A) CLIPPING, LEFT ATRIAL APPENDAGE USING 40 MM ATRICLIP (N/A) ECHOCARDIOGRAM, TRANSESOPHAGEAL, INTRAOPERATIVE (N/A) Subjective: Said he had a good day yesterday.  Rested well last night.  Reverted back to atrial fibrillation just before 5 AM this morning with V-rate around 100.  On room air  Objective: Vital signs in last 24 hours: Temp:  [97.8 F (36.6 C)-98.5 F (36.9 C)] 98.3 F (36.8 C) (01/04 0349) Pulse Rate:  [60-71] 60 (01/04 0349) Cardiac Rhythm: Sinus bradycardia (01/04 0340) Resp:  [16-20] 18 (01/04 0349) BP: (119-148)/(63-95) 119/70 (01/04 0349) SpO2:  [96 %-99 %] 96 % (01/04 0349) Weight:  [80.9 kg] 80.9 kg (01/04 0525)     Intake/Output from previous day: 01/03 0701 - 01/04 0700 In: 370 [P.O.:370] Out: 2250 [Urine:2250] Intake/Output this shift: No intake/output data recorded.  General appearance: alert, cooperative, and no distress Neurologic: intact Heart: Irregularly irregular rhythm.  Monitor shows A-fib 90 to 100-minute since 4:50 AM Lungs: Normal respiratory effort on room air.  Breath sounds are clear to auscultation. Abdomen: Soft, no tenderness Extremities: No peripheral edema. Wound: The sternotomy incision is well-approximated and dry.  Lab Results: Recent Labs    09/11/24 0415  WBC 9.3  HGB 11.4*  HCT 35.2*  PLT 210   BMET:  Recent Labs    09/10/24 0426 09/11/24 0415  NA 134* 137  K 4.6 4.6  CL 101 102  CO2 25 26  GLUCOSE 136* 138*  BUN 18 19  CREATININE 0.76 0.95  CALCIUM  7.9* 8.5*    PT/INR: No results for input(s): LABPROT, INR in the last 72 hours. ABG    Component Value Date/Time   PHART 7.319 (L) 09/07/2024 1817   HCO3 23.8  09/07/2024 1817   TCO2 25 09/07/2024 1817   ACIDBASEDEF 2.0 09/07/2024 1817   O2SAT 97 09/07/2024 1817   CBG (last 3)  Recent Labs    09/09/24 1112  GLUCAP 138*    Assessment/Plan: S/P Procedures (LRB): CORONARY ARTERY BYPASS GRAFTING (CABG) TIMES ONE USING LEFT INTERNAL MAMMARY ARTERY (N/A) REPLACEMENT, AORTIC VALVE, OPEN USING 23 MM INSPIRIS RESILIA AORTIC VALVE (N/A) CLIPPING, LEFT ATRIAL APPENDAGE USING 40 MM ATRICLIP (N/A) ECHOCARDIOGRAM, TRANSESOPHAGEAL, INTRAOPERATIVE (N/A)  - Postop day 5 CABG x 1 and bioprosthetic AVR for severe aortic stenosis.  Progressing well and fully independent with mobility.  On metoprolol  25 mg twice daily, rosuvastatin , and aspirin .  - Postoperative atrial fibrillation: Converted back to sinus rhythm on amiodarone  and remained in sinus rhythm until a few hours ago.  Now back in atrial fibrillation with reasonable rate control.  Will give an additional amiodarone  bolus 150 mg IV x 1 this morning and continue with 400 mg twice daily.  Did consider adding anticoagulation.  K+ 4.6.  Left atrial appendage was clipped at the time of surgery.  -Chronic back pain: Control has been much better over the past few days  -Heme: Mild expected acute blood loss anemia.  Hematocrit is trending up.  -Renal: Normal function.  Weight is down from yesterday but still about 3lbs over preop.  Continue Lasix  40 mg daily.  -GI: Not much appetite but tolerating p.o.'s okay.  Has had bowel movement since  surgery.  -Disposition: Progressing well postop day 4 CABG x 1 and bioprosthetic AVR.  Has had recurrent A-fib this morning, will rebolus with IV amiodarone .     LOS: 5 days    Myron G. Roddenberry, PA-C 09/12/2024  Patient seen and examined, agree with above Went back into A fib with controlled VR On amiodarone - rebolus LAA clipped, will start Eliquis  Will see how he does with ambulation If tolerated will dc home later today  Elspeth C. Kerrin, MD [facility] [phone]  "

## 2024-09-12 NOTE — Progress Notes (Signed)
 Discharge education provided to pt's wife over phone, CCMD notified, PIV removed, AVS instructions reprinted regarding xeralto,  patient's TOC medications delivered to pt's room by pharmacist. Educated regarding wound care, follow up appointments and medications, patient is waiting for a Rollator, had no any questions during d/c.

## 2024-09-12 NOTE — Progress Notes (Signed)
 DME  delivered to patient bedside. This nursing staff contacted to transport patient to entrance A, where family member was waiting in vehicle to transport home.

## 2024-09-12 NOTE — Plan of Care (Signed)
" °  Problem: Clinical Measurements: Goal: Will remain free from infection Outcome: Progressing   Problem: Nutrition: Goal: Adequate nutrition will be maintained Outcome: Progressing   Problem: Activity: Goal: Risk for activity intolerance will decrease Outcome: Progressing   Problem: Pain Managment: Goal: General experience of comfort will improve and/or be controlled Outcome: Progressing   Problem: Safety: Goal: Ability to remain free from injury will improve Outcome: Progressing   Problem: Skin Integrity: Goal: Risk for impaired skin integrity will decrease Outcome: Progressing   "

## 2024-09-13 ENCOUNTER — Telehealth: Payer: Self-pay

## 2024-09-13 DIAGNOSIS — Z48812 Encounter for surgical aftercare following surgery on the circulatory system: Secondary | ICD-10-CM | POA: Diagnosis not present

## 2024-09-13 NOTE — Transitions of Care (Post Inpatient/ED Visit) (Signed)
" ° °  09/13/2024  Name: Cody Mcconnell MRN: [id number] DOB: 1946-03-30  Today's TOC FU Call Status: Today's TOC FU Call Status:: Unsuccessful Call (1st Attempt) Unsuccessful Call (1st Attempt) Date: 09/13/24  Attempted to reach the patient regarding the most recent Inpatient/ED visit.  Follow Up Plan: Additional outreach attempts will be made to reach the patient to complete the Transitions of Care (Post Inpatient/ED visit) call.   Mayara Paulson J. Janes Colegrove RN, MSN [facility], [facility] RN Care Manager Direct Dial: [phone]  Fax: [phone] Website: [email or web address]   "

## 2024-09-14 ENCOUNTER — Telehealth: Payer: Self-pay

## 2024-09-14 LAB — BPAM RBC
Blood Product Expiration Date: 202601262359
Blood Product Expiration Date: 202601282359
Unit Type and Rh: 7300
Unit Type and Rh: 7300

## 2024-09-14 LAB — TYPE AND SCREEN
ABO/RH(D): B POS
Antibody Screen: NEGATIVE
Unit division: 0
Unit division: 0

## 2024-09-14 NOTE — Transitions of Care (Post Inpatient/ED Visit) (Signed)
" ° °  09/14/2024  Name: Cody Mcconnell MRN: [id number] DOB: 05-22-1946  Today's TOC FU Call Status: Today's TOC FU Call Status:: Unsuccessful Call (2nd Attempt) Unsuccessful Call (2nd Attempt) Date: 09/14/24  Attempted to reach the patient regarding the most recent Inpatient/ED visit.  Follow Up Plan: Additional outreach attempts will be made to reach the patient to complete the Transitions of Care (Post Inpatient/ED visit) call.   Quincey Nored J. Annaliz Aven RN, MSN [facility], [facility] RN Care Manager Direct Dial: [phone]  Fax: [phone] Website: [email or web address]   "

## 2024-09-15 ENCOUNTER — Telehealth (HOSPITAL_COMMUNITY): Payer: Self-pay

## 2024-09-15 ENCOUNTER — Encounter (HOSPITAL_COMMUNITY): Payer: Self-pay

## 2024-09-15 ENCOUNTER — Telehealth: Payer: Self-pay

## 2024-09-15 NOTE — Telephone Encounter (Signed)
 Received return phone call from Patient.  He stated that he is interested in Cardiac rehab.Advised that we will be calling him back to schedule and give ins details after he has been cleared by nurse navigator.

## 2024-09-15 NOTE — Transitions of Care (Post Inpatient/ED Visit) (Signed)
" ° °  09/15/2024  Name: Cody Mcconnell MRN: [id number] DOB: 04/13/1946  Today's TOC FU Call Status: Today's TOC FU Call Status:: Unsuccessful Call (3rd Attempt) Unsuccessful Call (3rd Attempt) Date: 09/15/24  Attempted to reach the patient regarding the most recent Inpatient/ED visit.  Follow Up Plan: No further outreach attempts will be made at this time. We have been unable to contact the patient.   Lonzie Simmer J. Bram Hottel RN, MSN [facility], [facility] RN Care Manager Direct Dial: [phone]  Fax: [phone] Website: [email or web address]   "

## 2024-09-15 NOTE — Telephone Encounter (Signed)
 Called patient to see if he is interested in the Cardiac Rehab Program.  No Answer, could not leave VM.  Sent letter via [employer].

## 2024-09-20 ENCOUNTER — Ambulatory Visit: Payer: Self-pay

## 2024-09-20 ENCOUNTER — Ambulatory Visit: Admitting: Cardiovascular Disease

## 2024-09-20 ENCOUNTER — Other Ambulatory Visit: Payer: Self-pay | Admitting: Surgery

## 2024-09-20 ENCOUNTER — Ambulatory Visit (HOSPITAL_COMMUNITY)
Admission: RE | Admit: 2024-09-20 | Discharge: 2024-09-20 | Disposition: A | Discharge status: Home / Self Care | Source: Ambulatory Visit | Attending: Internal Medicine | Admitting: Internal Medicine

## 2024-09-20 VITALS — BP 130/70 | HR 55 | Resp 18 | Ht 69.0 in | Wt 177.0 lb

## 2024-09-20 DIAGNOSIS — Z951 Presence of aortocoronary bypass graft: Secondary | ICD-10-CM

## 2024-09-20 DIAGNOSIS — Z953 Presence of xenogenic heart valve: Secondary | ICD-10-CM

## 2024-09-20 DIAGNOSIS — I350 Nonrheumatic aortic (valve) stenosis: Secondary | ICD-10-CM | POA: Diagnosis present

## 2024-09-20 MED ORDER — METHOCARBAMOL 500 MG PO TABS: 500.0000 mg | ORAL_TABLET | Freq: Three times a day (TID) | ORAL | 0 refills | Status: AC

## 2024-09-20 NOTE — Patient Instructions (Addendum)
-  Follow up in 2 weeks with chest xray  You may continue to gradually increase your physical activity as tolerated.  Refrain from any heavy lifting or strenuous use of your arms and shoulders until at least 6 weeks from the time of your surgery, and avoid activities that cause increased pain in your chest on the side of your surgical incision.  Otherwise you may continue to increase activities without any particular limitations.  Increase the intensity and duration of physical activity gradually.   Endocarditis is a potentially serious infection of heart valves or inside lining of the heart.  It occurs more commonly in patients with diseased heart valves (such as patient's with aortic or mitral valve disease) and in patients who have undergone heart valve repair or replacement.  Certain surgical and dental procedures may put you at risk, such as dental cleaning, other dental procedures, or any surgery involving the respiratory, urinary, gastrointestinal tract, gallbladder or prostate gland.   To minimize your chances for develooping endocarditis, maintain good oral health and seek prompt medical attention for any infections involving the mouth, teeth, gums, skin or urinary tract.    Always notify your doctor or dentist about your underlying heart valve condition before having any invasive procedures. You will need to take antibiotics before certain procedures, including all routine dental cleanings or other dental procedures.  Your cardiologist or dentist should prescribe these antibiotics for you to be taken ahead of time.

## 2024-09-20 NOTE — Progress Notes (Signed)
 "     [address] [place] [address]             [phone]       HPI:  Patient returns for routine postoperative follow-up having undergone Coronary artery bypass grafting x 1, left internal mammary artery graft to the LAD, aortic valve replacement using a 23 mm Edwards INSPIRIS RESILIA pericardial valve and clipping of left atrial appendage on 09/07/2024 with Dr. Lucas.  The patient's early postoperative recovery while in the hospital was notable for developing atrial fibrillation with RVR. He was treated with amiodarone  and chemically converted back to normal sinus rhythm. He was started on xarelto  for atrial fibrillation.  Since hospital discharge the patient reports that he has been doing well.  He has not had much incisional pain.  His chronic low back and leg pain and this has been exacerbated since surgery. He has a harder time being active with this pain.  He has noticed improvement of dizziness with standing since surgery. He denies chest pain, shortness of breath and lower leg edema.     Allergies as of 09/20/2024   No Known Allergies      Medication List        Accurate as of September 20, 2024  3:09 PM. If you have any questions, ask your nurse or doctor.          amiodarone  200 MG tablet Commonly known as: PACERONE  Take 2 tablets (400 mg total) by mouth 2 (two) times daily for 7 days then reduce the dose to 1 tablet (200mg ) TWICE daily for 2 weeks then reduce the dose to 1 tablet  (200mg ) ONCE daily.   aspirin  EC 81 MG tablet Take 81 mg by mouth in the morning. Swallow whole.   buPROPion 150 MG 24 hr tablet Commonly known as: WELLBUTRIN XL Take 150 mg by mouth every morning.   cetirizine 10 MG tablet Commonly known as: ZYRTEC Take 10 mg by mouth in the morning.   diclofenac Sodium 1 % Gel Commonly known as: VOLTAREN Apply 1 Application topically daily as needed (pain).   escitalopram  10 MG tablet Commonly known as:  LEXAPRO  Take 10 mg by mouth daily.   flunisolide  25 MCG/ACT (0.025%) Soln Commonly known as: NASALIDE  Place 1-2 sprays into the nose 2 (two) times daily as needed (nasal congestion).   ipratropium 0.03 % nasal spray Commonly known as: ATROVENT  Place 1-2 sprays into both nostrils 2 (two) times daily as needed (nasal drainage).   metoprolol  tartrate 25 MG tablet Commonly known as: LOPRESSOR  Take 1 tablet (25 mg total) by mouth 2 (two) times daily.   montelukast  10 MG tablet Commonly known as: SINGULAIR  Take 1 tablet (10 mg total) by mouth daily as needed (for allergies). What changed: when to take this   rosuvastatin  20 MG tablet Commonly known as: CRESTOR  Take 20 mg by mouth at bedtime.   sildenafil 100 MG tablet Commonly known as: VIAGRA Take 100 mg by mouth daily as needed for erectile dysfunction.   tadalafil 20 MG tablet Commonly known as: CIALIS Take 20 mg by mouth in the morning.   Xarelto  20 MG Tabs tablet Generic drug: rivaroxaban  Take 1 tablet (20 mg total) by mouth daily with supper.         ROS Review of Systems  Constitutional:  Positive for malaise/fatigue.  Respiratory:  Negative for cough and shortness of breath.   Cardiovascular:  Negative for chest pain, palpitations  and leg swelling.  Musculoskeletal:  Positive for back pain.      BP 130/70   Pulse (!) 55   Resp 18   Ht 5' 9 (1.753 m)   Wt 177 lb (80.3 kg)   SpO2 97%   BMI 26.14 kg/m    Physical Exam Constitutional:      Appearance: Normal appearance.  HENT:     Head: Normocephalic and atraumatic.  Cardiovascular:     Rate and Rhythm: Normal rate and regular rhythm.     Heart sounds: Normal heart sounds, S1 normal and S2 normal.  Pulmonary:     Effort: Pulmonary effort is normal.     Breath sounds: Normal breath sounds.  Skin:    General: Skin is warm and dry.     Comments: Incision sites without erythema Sutures removed from chest tube sites  Neurological:     General: No  focal deficit present.     Mental Status: He is alert and oriented to person, place, and time.      Imaging: EXAM: 2 VIEW(S) XRAY OF THE CHEST 09/20/2024 02:11:29 PM   COMPARISON: 09/09/2024   CLINICAL HISTORY: s/p cabg   FINDINGS:   LUNGS AND PLEURA: Small to moderate left pleural effusion. Trace right pleural effusion. Left basilar airspace opacity, likely atelectasis. No pneumothorax.   HEART AND MEDIASTINUM: Prosthetic aortic valve in place. Atrial appendage clip in place. No acute abnormality of the cardiac and mediastinal silhouettes.   BONES AND SOFT TISSUES: Cervical spinal fusion hardware partially visualized. Median sternotomy noted. No acute osseous abnormality.   IMPRESSION: 1. Small to moderate left pleural effusion and trace right pleural effusion. 2. Left basilar opacities, likely atelectasis.   Electronically signed by: Waddell Calk MD MD 09/20/2024 03:43 PM EST RP Workstation: [id number]   Assessment/Plan:  S/P CABG x 1 -We reviewed today's chest x ray which showed small left and trace right pleural effusions. He is asymptomatic and euvolemic on exam. He should continue to use his incentive spirometer.  -Discussed continuation of sternal precautions until a full 6 weeks from surgery. He should continue to not lift over 10 pounds until a full 3 months from surgery -Prescribed robaxin  for him to use as needed for low back pain -He should try to be active and increase as tolerated -He has follow up with cardiology on 10/07/2024  S/P aortic valve replacement with bioprosthetic valve -Discussed need for prophylactic antibiotics with all dental cleanings/procedures and minor surgeries  -Follow up in 2 weeks with chest xray with Dr. Lucas Manuelita CHRISTELLA Rutha, PA-C 3:09 PM 09/20/2024  "

## 2024-09-24 ENCOUNTER — Other Ambulatory Visit: Payer: Self-pay | Admitting: *Deleted

## 2024-09-24 MED ORDER — FUROSEMIDE 40 MG PO TABS: 40.0000 mg | ORAL_TABLET | Freq: Every day | ORAL | 0 refills | Status: AC

## 2024-09-24 MED ORDER — POTASSIUM CHLORIDE CRYS ER 20 MEQ PO TBCR: 20.0000 meq | EXTENDED_RELEASE_TABLET | Freq: Every day | ORAL | 0 refills | Status: DC

## 2024-09-24 NOTE — Progress Notes (Signed)
 Lasix  and Potassium refill sent to patient's preferred pharmacy d/t SOB at rest. Patient instructed to take 12.5 mg of Metoprolol  BID d/t symptomatic bradycardia, per L. [place], PA. Patient notified via MyChart.

## 2024-09-28 ENCOUNTER — Ambulatory Visit

## 2024-09-28 ENCOUNTER — Encounter: Payer: Self-pay | Admitting: Cardiovascular Disease

## 2024-09-28 ENCOUNTER — Ambulatory Visit: Attending: Cardiovascular Disease | Admitting: Cardiovascular Disease

## 2024-09-28 VITALS — BP 130/68 | HR 60 | Ht 69.0 in | Wt 165.2 lb

## 2024-09-28 DIAGNOSIS — I25119 Atherosclerotic heart disease of native coronary artery with unspecified angina pectoris: Secondary | ICD-10-CM | POA: Diagnosis present

## 2024-09-28 DIAGNOSIS — I4891 Unspecified atrial fibrillation: Secondary | ICD-10-CM

## 2024-09-28 DIAGNOSIS — I9789 Other postprocedural complications and disorders of the circulatory system, not elsewhere classified: Secondary | ICD-10-CM | POA: Diagnosis present

## 2024-09-28 DIAGNOSIS — Z953 Presence of xenogenic heart valve: Secondary | ICD-10-CM | POA: Insufficient documentation

## 2024-09-28 MED ORDER — AMIODARONE HCL 200 MG PO TABS: 200.0000 mg | ORAL_TABLET | Freq: Every day | ORAL | 0 refills | Status: AC

## 2024-09-28 NOTE — Progress Notes (Signed)
 " Cardiology Office Note:    Date:  09/28/2024   ID:  Cody Mcconnell, DOB Jan 14, 1946, MRN [id number]  PCP:  Cody Pagan, NP   Cody Mcconnell Providers Cardiologist:  Cody Fell, MD     Referring MD: Cody Pagan, NP   Chief Complaint  Patient presents with   Follow-up    S/P AVR/CABG    History of Present Illness:    Cody Mcconnell is a 79 y.o. male presenting for postoperative follow-up after undergoing bioprosthetic aortic valve replacement with a 23 mm [employer] Inspiris bioprosthetic valve, single-vessel CABG with a LIMA to LAD, and left atrial appendage clipping on 09/07/2024 by Dr. Lucas.  His postoperative course was complicated by atrial fibrillation with RVR treated with amiodarone .  He remained in sinus rhythm until postoperative day 5 when he developed recurrent atrial fibrillation, given additional IV amiodarone  and continued on oral amiodarone .  He was started on rivaroxaban .  The patient is here with his wife today.  He was seen in the cardiac surgery office for early posthospital follow-up and was found to have a left pleural effusion.  He was quite short of breath and was started on furosemide .  He is feeling much better now and states that his breathing has improved significantly.  His biggest problem since surgery has been low back pain and he has some chronic issues with this.  He denies any exertional chest discomfort.  No lightheadedness or near syncope.  No further heart palpitations.  He has been monitoring his heart rhythm on his smart watch and his heart rate has been steady in the low to mid 50s at home.  He is just starting to slowly increase his physical activity.   Current Medications: Active Medications[1]   Allergies:   Patient has no known allergies.   ROS:   Please see the history of present illness.    All other systems reviewed and are negative.  EKGs/Labs/Other Studies Reviewed:    The following studies were reviewed  today: Cardiac Studies & Procedures   ______________________________________________________________________________________________ CARDIAC CATHETERIZATION  CARDIAC CATHETERIZATION 08/04/2024  Conclusion   Ost LAD lesion is 80% stenosed.   Prox LAD to Mid LAD lesion is 50% stenosed.   Dist Cx lesion is 50% stenosed.  1.  Known severe aortic stenosis with bulky aortic valve calcification demonstrated on plain fluoroscopy 2.  Severe 80% ostial LAD stenosis and moderate 50% proximal LAD stenosis with a diffusely calcified LAD, confirmed hemodynamic significance with an RFR of 0.75. 3.  Patent left circumflex with mild diffuse plaquing and no significant stenosis 4.  Patent RCA with mild diffuse plaquing and no significant stenosis  Recommendations: Cardiac surgical referral for consideration of single vessel CABG/AVR  Findings Coronary Findings Diagnostic  Dominance: Right  Left Anterior Descending There is mild diffuse disease throughout the vessel. Ost LAD lesion is 80% stenosed. Prox LAD to Mid LAD lesion is 50% stenosed.  Left Circumflex There is mild diffuse disease throughout the vessel. Dist Cx lesion is 50% stenosed.  First Obtuse Marginal Branch There is mild disease in the vessel.  Right Coronary Artery There is mild diffuse disease throughout the vessel.  Intervention  No interventions have been documented.   STRESS TESTS  MYOCARDIAL PERFUSION IMAGING 05/08/2023  Interpretation Summary   Findings are consistent with small area of anteroapical infarction. The study is low risk.   No ST deviation was noted.   LV perfusion is abnormal. There is no evidence of ischemia. There is  evidence of infarction. Defect 1: There is a small defect with moderate reduction in uptake present in the apical to mid anteroseptal location(s) that is fixed. There is normal wall motion in the defect area. Consistent with infarction.   Left ventricular function is normal. EF 57% End  diastolic cavity size is normal. End systolic cavity size is normal.   Prior study not available for comparison.   ECHOCARDIOGRAM  ECHOCARDIOGRAM LIMITED 07/15/2024  Narrative ECHOCARDIOGRAM LIMITED REPORT    Patient Name:   Cody Mcconnell Date of Exam: 07/15/2024 Medical Rec #:  [id number]          Height:       69.5 in Accession #:    [id number]         Weight:       170.8 lb Date of Birth:  04-14-1946          BSA:          1.942 m Patient Age:    78 years           BP:           108/58 mmHg Patient Gender: M                  HR:           60 bpm. Exam Location:  [facility]  Procedure: Cardiac Doppler, Limited Echo and Limited Color Doppler (Both Spectral and Color Flow Doppler were utilized during procedure).  Indications:    I35.0 AS  History:        Patient has prior history of Echocardiogram examinations. AS, Arrythmias:Atrial Fibrillation; Risk Factors:Dyslipidemia.  Sonographer:    Elsie Bohr RDCS Referring Phys: [phone] Cody Mcconnell  IMPRESSIONS   1. Left ventricular ejection fraction, by estimation, is 65 to 70%. The left ventricle has normal function. The left ventricle has no regional wall motion abnormalities. There is moderate concentric left ventricular hypertrophy. Left ventricular diastolic parameters are consistent with Grade II diastolic dysfunction (pseudonormalization). 2. Right ventricular systolic function is normal. The right ventricular size is normal. 3. The mitral valve is normal in structure. Mild mitral valve regurgitation. 4. AV is thickened, calcified with restricted motion. Peak and mean gradients through the valve are 59 and 35 mm Hg respectively AVA (VTI) is 0.62 cm2 Dimensionless valve index is 0.22 Overall consistent with severe AS. Note Stroke volume index low (31) Compared to echo from 2024, mean gradient is increased (28 to 35 mm Hg). Aortic valve regurgitation is mild.  FINDINGS Left Ventricle: Left ventricular ejection  fraction, by estimation, is 65 to 70%. The left ventricle has normal function. The left ventricle has no regional wall motion abnormalities. The left ventricular internal cavity size was normal in size. There is moderate concentric left ventricular hypertrophy. Left ventricular diastolic parameters are consistent with Grade II diastolic dysfunction (pseudonormalization).  Right Ventricle: The right ventricular size is normal. Right vetricular wall thickness was not assessed. Right ventricular systolic function is normal.  Left Atrium: Left atrial size was normal in size.  Right Atrium: Right atrial size was normal in size.  Pericardium: There is no evidence of pericardial effusion.  Mitral Valve: The mitral valve is normal in structure. Mild mitral valve regurgitation.  Tricuspid Valve: The tricuspid valve is normal in structure. Tricuspid valve regurgitation is trivial.  Aortic Valve: AV is thickened, calcified with restricted motion. Peak and mean gradients through the valve are 59 and 35 mm Hg respectively AVA (VTI) is 0.62 cm2 Dimensionless  valve index is 0.22 Overall consistent with severe AS. Note Stroke volume index low (31) Compared to echo from 2024, mean gradient is increased (28 to 35 mm Hg). Aortic valve regurgitation is mild. Aortic regurgitation PHT measures 363 msec. Aortic valve mean gradient measures 33.3 mmHg. Aortic valve peak gradient measures 56.0 mmHg. Aortic valve area, by VTI measures 0.63 cm.  Pulmonic Valve: The pulmonic valve was normal in structure. Pulmonic valve regurgitation is not visualized.  Aorta: The aortic root and ascending aorta are structurally normal, with no evidence of dilitation.  IAS/Shunts: No atrial level shunt detected by color flow Doppler.  LEFT VENTRICLE PLAX 2D LVIDd:         4.10 cm   Diastology LVIDs:         2.80 cm   LV e' medial:    6.85 cm/s LV PW:         1.60 cm   LV E/e' medial:  13.9 LV IVS:        1.30 cm   LV e' lateral:    7.18 cm/s LVOT diam:     1.90 cm   LV E/e' lateral: 13.3 LV SV:         60 LV SV Index:   31 LVOT Area:     2.84 cm   LEFT ATRIUM           Index        RIGHT ATRIUM LA diam:      4.10 cm 2.11 cm/m   RA Pressure: 3.00 mmHg LA Vol (A4C): 49.4 ml 25.44 ml/m AORTIC VALVE AV Area (Vmax):    0.68 cm AV Area (Vmean):   0.64 cm AV Area (VTI):     0.63 cm AV Vmax:           374.00 cm/s AV Vmean:          270.667 cm/s AV VTI:            0.949 m AV Peak Grad:      56.0 mmHg AV Mean Grad:      33.3 mmHg LVOT Vmax:         90.20 cm/s LVOT Vmean:        61.000 cm/s LVOT VTI:          0.212 m LVOT/AV VTI ratio: 0.22 AI PHT:            363 msec  AORTA Ao Root diam: 3.10 cm Ao Asc diam:  3.30 cm  MITRAL VALVE               TRICUSPID VALVE MV Area (PHT): 2.82 cm    Estimated RAP:  3.00 mmHg MV Decel Time: 269 msec MV E velocity: 95.40 cm/s  SHUNTS MV A velocity: 80.20 cm/s  Systemic VTI:  0.21 m MV E/A ratio:  1.19        Systemic Diam: 1.90 cm  Vina Gull MD Electronically signed by Vina Gull MD Signature Date/Time: 07/15/2024/7:42:12 PM    Final   TEE  ECHO INTRAOPERATIVE TEE 09/07/2024  Narrative *INTRAOPERATIVE TRANSESOPHAGEAL REPORT *    Patient Name:   CHARLIE DELENA ROVER Date of Exam: 09/07/2024 Medical Rec #:  [id number]          Height:       69.0 in Accession #:    [id number]         Weight:       175.2 lb Date of Birth:  05/19/1946  BSA:          1.95 m Patient Age:    78 years           BP:           152/98 mmHg Patient Gender: M                  HR:           52 bpm. Exam Location:  Anesthesiology  Transesophogeal exam was perform intraoperatively during surgical procedure. Patient was closely monitored under general anesthesia during the entirety of examination.  Indications:     CABG, AVR Performing Phys: [id number] DOR[address] MARLA FELLERS Diagnosing Phys: Elsie Needle MD  Complications: No known complications during this procedure. POST-OP  IMPRESSIONS _ Left Ventricle: The left ventricle is unchanged from pre-bypass. _ Right Ventricle: The right ventricle appears unchanged from pre-bypass. _ Aortic Valve: A bioprosthetic valve was placed, leaflets are freely mobile and leaflets thin Size; 23mm. There is no regurgitation. The gradient recorded across the prosthetic valve is within the expected range. _ Mitral Valve: The mitral valve appears unchanged from pre-bypass. _ Tricuspid Valve: The tricuspid valve appears unchanged from pre-bypass. _ Pulmonic Valve: The pulmonic valve appears unchanged from pre-bypass.  PRE-OP FINDINGS Left Ventricle: The left ventricle has normal systolic function, with an ejection fraction of 55-60%. The cavity size was normal. There is moderately increased left ventricular wall thickness. No evidence of left ventricular regional wall motion abnormalities. There is moderate concentric left ventricular hypertrophy.   Right Ventricle: The right ventricle has normal systolic function. The cavity was normal. There is no increase in right ventricular wall thickness.  Left Atrium: Left atrial size was not assessed. No left atrial/left atrial appendage thrombus was detected.  Right Atrium: Right atrial size was not assessed.  Interatrial Septum: No atrial level shunt detected by color flow Doppler.  Pericardium: There is no evidence of pericardial effusion.  Mitral Valve: The mitral valve is normal in structure. Mitral valve regurgitation is trivial by color flow Doppler.  Tricuspid Valve: The tricuspid valve was normal in structure. Tricuspid valve regurgitation was not visualized by color flow Doppler.  Aortic Valve: The aortic valve is tricuspid Aortic valve regurgitation is mild by color flow Doppler. There is severe stenosis of the aortic valve. There is severe thickening and severe calcifcation present with severely decreased mobility.  Pulmonic Valve: The pulmonic valve was normal in  structure. Pulmonic valve regurgitation is trivial by color flow Doppler.    Elsie Needle MD Electronically signed by Elsie Needle MD Signature Date/Time: 09/07/2024/2:59:44 PM    Final  MONITORS  CARDIAC EVENT MONITOR 05/26/2020       ______________________________________________________________________________________________      EKG:        Recent Labs: 09/03/2024: ALT 20 09/11/2024: Hemoglobin 11.4; Platelets 210 09/12/2024: BUN 16; Creatinine, Ser 0.91; Magnesium  1.9; Potassium 4.2; Sodium 136  Recent Lipid Panel No results found for: CHOL, TRIG, HDL, CHOLHDL, VLDL, LDLCALC, LDLDIRECT   Risk Assessment/Calculations:    CHA2DS2-VASc Score = 4   This indicates a 4.8% annual risk of stroke. The patient's score is based upon: CHF History: 0 HTN History: 1 Diabetes History: 0 Stroke History: 0 Vascular Disease History: 1 Age Score: 2 Gender Score: 0               Physical Exam:    VS:  BP 130/68   Pulse 60   Ht 5' 9 (1.753 m)   Wt 165 lb 3.2  oz (74.9 kg)   SpO2 97%   BMI 24.40 kg/m     Wt Readings from Last 3 Encounters:  09/28/24 165 lb 3.2 oz (74.9 kg)  09/20/24 177 lb (80.3 kg)  09/12/24 178 lb 4.8 oz (80.9 kg)     GEN:  Well nourished, well developed in no acute distress HEENT: Normal NECK: No JVD; No carotid bruits LYMPHATICS: No lymphadenopathy CARDIAC: RRR, 1/6 ejection murmur at the right upper sternal border, no diastolic murmur.  Sternotomy site is well-healed. RESPIRATORY:  Clear to auscultation without rales, wheezing or rhonchi  ABDOMEN: Soft, non-tender, non-distended MUSCULOSKELETAL:  No edema; No deformity  SKIN: Warm and dry NEUROLOGIC:  Alert and oriented x 3 PSYCHIATRIC:  Normal affect   Assessment & Plan S/P aortic valve replacement with bioprosthetic valve The patient has a normal cardiac exam.  He is doing well now on once daily furosemide  after surgery.  Treated with rivaroxaban  for  postoperative atrial fibrillation.  Also on low-dose aspirin .  Recommend 2 to 51-month follow-up with Glendia Ferrier.  He will need a postoperative echo and this can be arranged from his next visit. Coronary artery disease involving native coronary artery of native heart with angina pectoris No recurrent angina. Pleural effusion post-op with associated dyspnea. Continue lasix  through this weekend, then stop. Appears euvolemic.  Postoperative atrial fibrillation (HCC) Reduce amiodarone  to 200 mg once daily. Stop amiodarone  3/1. Check ZIO monitor for arrhythmia surveillance in April after amiodarone  washout. Continue rivaroxaban  until monitor completed.      Cardiac Rehabilitation Eligibility Assessment            Medication Adjustments/Labs and Tests Ordered: Current medicines are reviewed at length with the patient today.  Concerns regarding medicines are outlined above.  Orders Placed This Encounter  Procedures   LONG TERM MONITOR (3-14 DAYS)   Meds ordered this encounter  Medications   amiodarone  (PACERONE ) 200 MG tablet    Sig: Take 1 tablet (200 mg total) by mouth daily. Stop taking 11/07/24.    Dispense:  40 tablet    Refill:  0    Patient Instructions  Medication Instructions:  DECREASE Amiodarone  to 200 mg once daily. Then, STOP Amiodarone  on 11/07/24.  Continue to take Furosemide  (Lasix ) and Potassium through Sunday 10/03/24.  *If you need a refill on your cardiac medications before your next appointment, please call your pharmacy*  Lab Work: None ordered today. If you have labs (blood work) drawn today and your tests are completely normal, you will receive your results only by: MyChart Message (if you have MyChart) OR A paper copy in the mail If you have any lab test that is abnormal or we need to change your treatment, we will call you to review the results.  Testing/Procedures: Your physician has requested that you wear a Zio heart monitor for 14 days in April 2026.  This will be mailed to your home with instructions on how to apply the monitor and how to return it when finished. Please allow 2 weeks after returning the heart monitor before our office calls you with the results.   Follow-Up: At [facility], you and your health needs are our priority.  As part of our continuing mission to provide you with exceptional heart care, our providers are all part of one team.  This team includes your primary Cardiologist (physician) and Advanced Practice Providers or APPs (Physician Assistants and Nurse Practitioners) who all work together to provide you with the care you need, when you  need it.  Your next appointment:   8-12 week(s)  Provider:   Glendia Ferrier, PA-C    Other Instructions GEOFFRY HEWS- Long Term Monitor Instructions  Your physician has requested you wear a ZIO patch monitor for 14 days.   This is a single patch monitor. [employer] supplies one patch monitor per enrollment. Additional  stickers are not available. Please do not apply patch if you will be having a Nuclear Stress Test,  Echocardiogram, Cardiac CT, MRI, or Chest Xray during the period you would be wearing the  monitor. The patch cannot be worn during these tests. You cannot remove and re-apply the  ZIO XT patch monitor.   Your ZIO patch monitor will be mailed 3 day USPS to your address on file. It may take 3-5 days  to receive your monitor after you have been enrolled.   Once you have received your monitor, please review the enclosed instructions. Your monitor  has already been registered assigning a specific monitor serial # to you.     Billing and Patient Assistance Program Information  We have supplied [employer] with any of your insurance information on file for billing purposes.  [employer] offers a sliding scale Patient Assistance Program for patients that do not have  insurance, or whose insurance does not completely cover the cost of the ZIO monitor.  You must apply for the  Patient Assistance Program to qualify for this discounted rate.   To apply, please call [employer] at [phone], select option 4, select option 2, ask to apply for  Patient Assistance Program. Meredeth will ask your household income, and how many people  are in your household. They will quote your out-of-pocket cost based on that information.  [employer] will also be able to set up a 74-month, interest-free payment plan if needed.     Applying the monitor  Shave hair from upper left chest.  Hold abrader disc by orange tab. Rub abrader in 40 strokes over the upper left chest as  indicated in your monitor instructions.  Clean area with 4 enclosed alcohol pads. Let dry.  Apply patch as indicated in monitor instructions. Patch will be placed under collarbone on left  side of chest with arrow pointing upward.  Rub patch adhesive wings for 2 minutes. Remove white label marked 1. Remove the white  label marked 2. Rub patch adhesive wings for 2 additional minutes.  While looking in a mirror, press and release button in center of patch. A small green light will  flash 3-4 times. This will be your only indicator that the monitor has been turned on.  Do not shower for the first 24 hours. You may shower after the first 24 hours.  Press the button if you feel a symptom. You will hear a small click. Record Date, Time and  Symptom in the Patient Logbook.  When you are ready to remove the patch, follow instructions on the last 2 pages of Patient  Logbook. Stick patch monitor onto the last page of Patient Logbook.   Place Patient Logbook in the blue and white box. Use locking tab on box and tape box closed  securely. The blue and white box has prepaid postage on it. Please place it in the mailbox as  soon as possible. Your physician should have your test results approximately 7 days after the  monitor has been mailed back to [facility].   Call [facility] Customer Care at [phone] if you  have questions regarding  your ZIO XT  patch monitor. Call them immediately if you see an orange light blinking on your  monitor.   If your monitor falls off in less than 4 days, contact our Monitor department at [phone].   If your monitor becomes loose or falls off after 4 days call [employer] at [phone] for  suggestions on securing your monitor.       SignedOzell Fell, MD  09/28/2024 11:32 AM    Winslow Mcconnell     [1]  Current Meds  Medication Sig   amiodarone  (PACERONE ) 200 MG tablet Take 1 tablet (200 mg total) by mouth daily. Stop taking 11/07/24.   aspirin  EC 81 MG tablet Take 81 mg by mouth in the morning. Swallow whole.   buPROPion (WELLBUTRIN XL) 150 MG 24 hr tablet Take 150 mg by mouth every morning.   cetirizine (ZYRTEC) 10 MG tablet Take 10 mg by mouth in the morning.   diclofenac Sodium (VOLTAREN) 1 % GEL Apply 1 Application topically daily as needed (pain).   flunisolide  (NASALIDE ) 25 MCG/ACT (0.025%) SOLN Place 1-2 sprays into the nose 2 (two) times daily as needed (nasal congestion).   furosemide  (LASIX ) 40 MG tablet Take 1 tablet (40 mg total) by mouth daily.   ipratropium (ATROVENT ) 0.03 % nasal spray Place 1-2 sprays into both nostrils 2 (two) times daily as needed (nasal drainage).   methocarbamol  (ROBAXIN ) 500 MG tablet Take 1 tablet (500 mg total) by mouth 3 (three) times daily for 10 days.   metoprolol  tartrate (LOPRESSOR ) 12.5 mg TABS tablet Take 12.5 mg by mouth 2 (two) times daily.   montelukast  (SINGULAIR ) 10 MG tablet Take 1 tablet (10 mg total) by mouth daily as needed (for allergies). (Patient taking differently: Take 10 mg by mouth in the morning.)   potassium chloride  SA (KLOR-CON  M) 20 MEQ tablet Take 1 tablet (20 mEq total) by mouth daily.   rivaroxaban  (XARELTO ) 20 MG TABS tablet Take 1 tablet (20 mg total) by mouth daily with supper.   rosuvastatin  (CRESTOR ) 20 MG tablet Take 20 mg by mouth at bedtime.   sildenafil (VIAGRA) 100 MG  tablet Take 100 mg by mouth daily as needed for erectile dysfunction.   tadalafil (CIALIS) 20 MG tablet Take 20 mg by mouth in the morning.   traZODone (DESYREL) 50 MG tablet Take 50-100 mg by mouth at bedtime as needed.   [DISCONTINUED] amiodarone  (PACERONE ) 200 MG tablet Take 2 tablets (400 mg total) by mouth 2 (two) times daily for 7 days then reduce the dose to 1 tablet (200mg ) TWICE daily for 2 weeks then reduce the dose to 1 tablet  (200mg ) ONCE daily.   "

## 2024-09-28 NOTE — Patient Instructions (Signed)
 Medication Instructions:  DECREASE Amiodarone  to 200 mg once daily. Then, STOP Amiodarone  on 11/07/24.  Continue to take Furosemide  (Lasix ) and Potassium through Sunday 10/03/24.  *If you need a refill on your cardiac medications before your next appointment, please call your pharmacy*  Lab Work: None ordered today. If you have labs (blood work) drawn today and your tests are completely normal, you will receive your results only by: MyChart Message (if you have MyChart) OR A paper copy in the mail If you have any lab test that is abnormal or we need to change your treatment, we will call you to review the results.  Testing/Procedures: Your physician has requested that you wear a Zio heart monitor for 14 days in April 2026. This will be mailed to your home with instructions on how to apply the monitor and how to return it when finished. Please allow 2 weeks after returning the heart monitor before our office calls you with the results.   Follow-Up: At [facility], you and your health needs are our priority.  As part of our continuing mission to provide you with exceptional heart care, our providers are all part of one team.  This team includes your primary Cardiologist (physician) and Advanced Practice Providers or APPs (Physician Assistants and Nurse Practitioners) who all work together to provide you with the care you need, when you need it.  Your next appointment:   8-12 week(s)  Provider:   Glendia Ferrier, PA-C    Other Instructions GEOFFRY HEWS- Long Term Monitor Instructions  Your physician has requested you wear a ZIO patch monitor for 14 days.   This is a single patch monitor. [employer] supplies one patch monitor per enrollment. Additional  stickers are not available. Please do not apply patch if you will be having a Nuclear Stress Test,  Echocardiogram, Cardiac CT, MRI, or Chest Xray during the period you would be wearing the  monitor. The patch cannot be worn during these  tests. You cannot remove and re-apply the  ZIO XT patch monitor.   Your ZIO patch monitor will be mailed 3 day USPS to your address on file. It may take 3-5 days  to receive your monitor after you have been enrolled.   Once you have received your monitor, please review the enclosed instructions. Your monitor  has already been registered assigning a specific monitor serial # to you.     Billing and Patient Assistance Program Information  We have supplied [employer] with any of your insurance information on file for billing purposes.  [employer] offers a sliding scale Patient Assistance Program for patients that do not have  insurance, or whose insurance does not completely cover the cost of the ZIO monitor.  You must apply for the Patient Assistance Program to qualify for this discounted rate.   To apply, please call [employer] at [phone], select option 4, select option 2, ask to apply for  Patient Assistance Program. Meredeth will ask your household income, and how many people  are in your household. They will quote your out-of-pocket cost based on that information.  [employer] will also be able to set up a 4-month, interest-free payment plan if needed.     Applying the monitor  Shave hair from upper left chest.  Hold abrader disc by orange tab. Rub abrader in 40 strokes over the upper left chest as  indicated in your monitor instructions.  Clean area with 4 enclosed alcohol pads. Let dry.  Apply patch as indicated in  monitor instructions. Patch will be placed under collarbone on left  side of chest with arrow pointing upward.  Rub patch adhesive wings for 2 minutes. Remove white label marked 1. Remove the white  label marked 2. Rub patch adhesive wings for 2 additional minutes.  While looking in a mirror, press and release button in center of patch. A small green light will  flash 3-4 times. This will be your only indicator that the monitor has been turned on.  Do not shower for the  first 24 hours. You may shower after the first 24 hours.  Press the button if you feel a symptom. You will hear a small click. Record Date, Time and  Symptom in the Patient Logbook.  When you are ready to remove the patch, follow instructions on the last 2 pages of Patient  Logbook. Stick patch monitor onto the last page of Patient Logbook.   Place Patient Logbook in the blue and white box. Use locking tab on box and tape box closed  securely. The blue and white box has prepaid postage on it. Please place it in the mailbox as  soon as possible. Your physician should have your test results approximately 7 days after the  monitor has been mailed back to [facility].   Call [facility] Customer Care at [phone] if you have questions regarding  your ZIO XT patch monitor. Call them immediately if you see an orange light blinking on your  monitor.   If your monitor falls off in less than 4 days, contact our Monitor department at [phone].   If your monitor becomes loose or falls off after 4 days call [employer] at [phone] for  suggestions on securing your monitor.

## 2024-09-28 NOTE — Progress Notes (Unsigned)
 Enrolled patient for a 14 day Zio XT  monitor to be mailed to patients home

## 2024-09-28 NOTE — Assessment & Plan Note (Signed)
 The patient has a normal cardiac exam.  He is doing well now on once daily furosemide  after surgery.  Treated with rivaroxaban  for postoperative atrial fibrillation.  Also on low-dose aspirin .  Recommend 2 to 38-month follow-up with Glendia Ferrier.  He will need a postoperative echo and this can be arranged from his next visit.

## 2024-10-02 NOTE — Progress Notes (Unsigned)
 " Cardiology Office Note:    Date:  10/07/2024   ID:  Cody Mcconnell, DOB 14-Sep-1945, MRN [id number]  PCP:  Corlis Pagan, NP   [facility] Providers Cardiologist:  Ozell Fell, MD     Referring MD: Corlis Pagan, NP   Chief complaint: Follow-up post CABG X1/AVR     History of Present Illness:   Cody Mcconnell is a 79 y.o. male with a hx of PAF, aortic stenosis, coronary artery calcification, HTN, HLD, presenting today for follow-up after recent AVR with single-vessel CABG (LIMA-LAD) and left atrial appendage clipping.  Presurgical echo 07/15/2024 demonstrating LVEF 65-70%, no RWMA, moderate concentric LVH, G2 DD, mild MV regurg, presurgical mean gradient of 35 mmHg, peak gradient 59 mmHg, VTI 0.62 cm, indicative of severe AS, stroke-volume index low (31).  LHC 08/04/2024: Severe 80% ostial LAD stenosis, moderate 50% proximal LAD stenosis with diffusely calcified LAD, with confirmed hemodynamic significance with an RFR of 0.75.  Patent LCx and RCA, diffuse plaquing without significant stenosis.  No intervention performed, referred for consideration of single-vessel CABG/AVR.  Elective surgery performed on 09/07/2024, with single-vessel CABG using LIMA-LAD, AVR with 23mm [employer]iences Inspiris bioprosthetic valve, and clipping to the left atrial appendage was performed.  He did develop postoperative atrial fibrillation with RVR, treated with amiodarone  with prompt conversion to NSR, started on rivaroxaban .  Early posthospital follow-up was found to have left pleural effusion with SOB, started on Lasix  with improvement in symptoms.  Most recent cardiology follow-up on 09/28/2024 with Dr. Fell patient was improving, starting to slowly increase physical activity, primary complaint was exacerbation of chronic low back pain following surgery.  Presents independently, appears stable from a cardiovascular standpoint. He denies chest pain, palpitations, dyspnea, orthopnea, n, v,   dark/tarry/bloody stools, hematuria, syncope, edema, weight gain.  He reports he had some occasional dizziness with positional changes that resolved following stopping his Lasix  recently.  He met with Dr. Sherrine yesterday, patient states he was told he can begin swimming again.  States he was struggling with significant back pain following his surgery, however this has improved significantly as well.  He plans on going to his beach house at some point in the next 2 weeks for change of scenery, does not intend on staying out in the sun or getting in the ocean while there.  Reports his activity intolerance is gradually improving on a daily basis.  States he was contacted by cardiac rehab to establish care, and returned their call without receiving further callback.  ROS:   Please see the history of present illness.    All other systems reviewed and are negative.     Past Medical History:  Diagnosis Date   Anemia    SLIGHT ANEMIA 4 MONTHS AGO   Aortic valve stenosis 12/19/2017   Echo 07/30/16 - EF 55-60, mild aortic stenosis (mean 9) // Echo 4/19:  EF 60-65 mild AS (mean 11) // Echocardiogram 11/21: EF 60-65, no RWMA, mild LVH, Gr 1 DD, normal RVSF, trivial MR, calcified AV, mod AS (mean 20 mmHg, Vmax 280 cm/s, DI 0.27), trivial AI  // Echo 5/22: EF 60-65, moderate LVH, no RWMA, trivial MR, moderate AS (mean gradient 20 mmHg, V-max 287 cm/s, DI 0.35)    Arthritis    OA   Benign prostate hyperplasia    unspecified whether lower urinary tract sym. present    Carotid atherosclerosis    Carotid US  4/19:  bilat ICA 1-39; vertebrals and subclavians normal   Carotid  atherosclerosis, bilateral 04/30/2018   1-39% on 12/2017 [facility] Everywhere Echo   Coronary artery calcification seen on CT scan 06/05/2018   Dysrhythmia    ATRIAL FIB   Elevated prostate specific antigen (PSA) 08/30/2016   Glucose intolerance    pre-diabetic   Hayfever    Heart murmur    History of exercise stress test    GXT 4/19:   ETT with good exercise tolerance (10:00); no chest pain; normal BP response; no diagnostic ST changes; negative adequate ETT.   HOH (hard of hearing)    BOTH EARS   Low back pain with sciatica 06/02/2012   Lumbar herniated disc 01/30/2018   Mild aortic stenosis 04/30/2018   12/2017 [facility] Everywhere Echo : Mean gradient (S): 11 mm Hg. Peak gradient (S): 24 mm Hg.   Numbness of right thumb    FROM CERVICAL NECK SURGERY   Osteoarthritis of right hip 09/21/2018   Overweight (BMI 25.0-29.9) 09/30/2018   Paroxysmal atrial fibrillation (HCC) 06/05/2018   Pre-diabetes    Prediabetes 04/30/2018   not sure about this   Prostatitis    unspecified prostatitis type   Pure hypercholesterolemia     Past Surgical History:  Procedure Laterality Date   ANTERIOR LAT LUMBAR FUSION Right 06/29/2020   Procedure: Right Lumbar Two-Three Anterolateral lumbar interbody fusion with lateral plate;  Surgeon: Unice Pac, MD;  Location: [facility] OR;  Service: Neurosurgery;  Laterality: Right;  Right Lumbar Two-Three Anterolateral lumbar interbody fusion with lateral plate   AORTIC VALVE REPLACEMENT N/A 09/07/2024   Procedure: REPLACEMENT, AORTIC VALVE, OPEN USING 23 MM INSPIRIS RESILIA AORTIC VALVE;  Surgeon: Lucas Dorise POUR, MD;  Location: [facility] OR;  Service: Open Heart Surgery;  Laterality: N/A;   CERVICAL SPINE SURGERY  2011   CLIPPING OF ATRIAL APPENDAGE N/A 09/07/2024   Procedure: CLIPPING, LEFT ATRIAL APPENDAGE USING 40 MM ATRICLIP;  Surgeon: Lucas Dorise POUR, MD;  Location: [facility] OR;  Service: Open Heart Surgery;  Laterality: N/A;   COLONOSCOPY     CORONARY ANGIOGRAPHY N/A 08/04/2024   Procedure: CORONARY ANGIOGRAPHY;  Surgeon: Wonda Sharper, MD;  Location[facility] INVASIVE CV LAB;  Service: Cardiovascular;  Laterality: N/A;   CORONARY ARTERY BYPASS GRAFT N/A 09/07/2024   Procedure: CORONARY ARTERY BYPASS GRAFTING (CABG) TIMES ONE USING LEFT INTERNAL MAMMARY ARTERY;  Surgeon: Lucas Dorise POUR, MD;  Location: [facility] OR;   Service: Open Heart Surgery;  Laterality: N/A;   CORONARY PRESSURE/FFR WITH 3D MAPPING N/A 08/04/2024   Procedure: Coronary Pressure/FFR w/3D Mapping;  Surgeon: Wonda Sharper, MD;  Location: [facility] INVASIVE CV LAB;  Service: Cardiovascular;  Laterality: N/A;   HIP SURGERY Right    arthroscopy   INTRAOPERATIVE TRANSESOPHAGEAL ECHOCARDIOGRAM N/A 09/07/2024   Procedure: ECHOCARDIOGRAM, TRANSESOPHAGEAL, INTRAOPERATIVE;  Surgeon: Lucas Dorise POUR, MD;  Location: [facility] OR;  Service: Open Heart Surgery;  Laterality: N/A;   LUMBAR DISC SURGERY  2014   L2-3, L3 TO L4 X 2 2019 AT [facility]   LUMBAR FUSION  2019   psoas muscle repair (groin muscle?)  2022   SHOULDER ARTHROSCOPY Right    TOOTH EXTRACTION     TOTAL HIP ARTHROPLASTY Right 09/29/2018   Procedure: TOTAL HIP ARTHROPLASTY ANTERIOR APPROACH;  Surgeon: Ernie Cough, MD;  Location: [facility] ORS;  Service: Orthopedics;  Laterality: Right;  71    Current Medications: Active Medications[1]   Allergies:   Patient has no known allergies.   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   Occupation: Long Term Care Facilities    Comment: Owns several  Tobacco Use   Smoking status: Former    Current packs/day: 0.00    Average packs/day: 2.0 packs/day for 9.0 years (18.0 ttl pk-yrs)    Types: Cigarettes    Start date: 70    Quit date: 1980    Years since quitting: 46.1   Smokeless tobacco: Never   Tobacco comments:    QUIT   Vaping Use   Vaping status: Never Used  Substance and Sexual Activity   Alcohol use: Never    Comment: rarely   Drug use: Never   Sexual activity: Not on file  Other Topics Concern   Not on file  Social History Narrative   Previously in [place], [place] - now in [place]   Brother is Dr. Oneil Mcconnell  (retired) from [place], [place]   Social Drivers of Health   Tobacco Use: Medium Risk (10/07/2024)   Patient History    Smoking Tobacco Use: Former    Smokeless Tobacco Use: Never    Passive Exposure: Not on file  Financial Resource Strain: Low Risk  (12/24/2021)   Received from [facility]   Overall Financial Resource Strain (CARDIA)  Food Insecurity: No Food Insecurity (09/10/2024)   Epic    Worried About Radiation Protection Practitioner of Food in the Last Year: Never true    Ran Out of Food in the Last Year: Never true  Transportation Needs: No Transportation Needs (09/10/2024)   Epic    Lack of Transportation (Medical): No    Lack of Transportation (Non-Medical): No  Physical Activity: Sufficiently Active (07/10/2023)   Received from [facility]   Exercise Vital Sign    Days of Exercise per Week: 4 days    Minutes of Exercise per Session: 50 min  Stress: No Stress Concern Present (04/02/2023)   [employer] of Occupational Health - Occupational Stress Questionnaire    Feeling of Stress : Only a little  Social Connections: Moderately Isolated (09/10/2024)   Social Connection and Isolation Panel    Frequency of Communication with Friends and Family: Twice a week    Frequency of Social Gatherings with Friends and Family: Twice a week    Attends Religious Services: Never    Database Administrator or Organizations: No    Attends Banker Meetings: Never    Marital Status: Married  Depression (PHQ2-9): Low Risk (11/14/2022)   Depression (PHQ2-9)    PHQ-2 Score: 0  Alcohol Screen: Low Risk (04/02/2023)   Alcohol Screen    Last Alcohol Screening Score (AUDIT): 0  Housing: Low Risk (09/10/2024)   Epic    Unable to Pay for Housing in the Last Year: No    Number of Times Moved in the Last Year: 0    Homeless in the Last Year: No  Utilities: Not At Risk (09/10/2024)   Epic    Threatened with loss of utilities: No  Health Literacy: Adequate Health Literacy (04/02/2023)   B1300  Health Literacy    Frequency of need for help with medical instructions: Never     Family History: The patient's family history includes CAD in his father; CAD (age of onset: 88) in his brother; Congestive Heart Failure in his father; Dementia in  his mother.  EKGs/Labs/Other Studies Reviewed:    The following studies were reviewed today:       Recent Labs: 09/03/2024: ALT 20 09/11/2024: Hemoglobin 11.4; Platelets 210 09/12/2024: BUN 16; Creatinine, Ser 0.91; Magnesium  1.9; Potassium 4.2; Sodium 136  Recent Lipid Panel No results found for: CHOL, TRIG, HDL, CHOLHDL, VLDL, LDLCALC, LDLDIRECT   Risk Assessment/Calculations:    CHA2DS2-VASc Score = 4   This indicates a 4.8% annual risk of stroke. The patient's score is based upon: CHF History: 0 HTN History: 1 Diabetes History: 0 Stroke History: 0 Vascular Disease History: 1 Age Score: 2 Gender Score: 0               Physical Exam:    VS:  BP 134/72   Pulse 63   Ht 5' 8.5 (1.74 m)   Wt 163 lb 9.6 oz (74.2 kg)   SpO2 96%   BMI 24.51 kg/m        Wt Readings from Last 3 Encounters:  10/07/24 163 lb 9.6 oz (74.2 kg)  10/06/24 163 lb (73.9 kg)  09/28/24 165 lb 3.2 oz (74.9 kg)     GEN:  Well nourished, well developed in no acute distress HEENT: Normal NECK: No JVD; No carotid bruits CARDIAC:  S1-S2 normal, 1/6 ejection murmur at the right upper sternal border, no murmurs, rubs, gallops RESPIRATORY:  Clear to auscultation without rales, wheezing or rhonchi  MUSCULOSKELETAL:  No edema; No deformity  SKIN: Warm and dry NEUROLOGIC:  Alert and oriented x 3 PSYCHIATRIC:  Normal affect       Assessment & Plan S/P aortic valve replacement with bioprosthetic valve Coronary artery disease involving native coronary artery of native heart with angina pectoris CAD s/p CABG X1 09/07/2024: Single-vessel CABG using LIMA-LAD, AVR with 23mm [employer]esc[employer] Inspiris bioprosthetic valve and clipping to the  left atrial appendage Continue sternal precautions as directed by CVTS.  No lifting over 10 pounds for a full 3 months following surgery. He met with Dr. Sherrine yesterday with repeat chest x-ray, this note is not yet available to me for review. Patient reported Dr. Sherrine was okay with low intensity activities, like swimming. Dr. Wonda cleared patient for cardiac rehab at previous visit. Cardiac rehab team has reached out already, patient is awaiting for them to return call. Will provide phone number in discharge paperwork today.  Will update BMP and CBC today SBE prophylaxis for all dental procedures discussed. No cardiac symptoms, normal cardiac exam. Continue advancing activity as tolerated, with precautions as above.  Continue low dose aspirin  EC 81 mg daily, lopressor  12.5 mg BID, rosuvastatin  20 mg daily PAF (paroxysmal atrial fibrillation) (HCC) Postoperative atrial fibrillation Continue amiodarone  200 mg daily, stop amiodarone  11/07/2024. Check ZIO monitor for arrhythmia surveillance in April following amiodarone  washout. Continue rivaroxaban  until cardiac monitoring completed.  Disposition: Follow up in 2-3 months with Glendia Ferrier, PA, as planned, or sooner if needed. Proceed to ED with any new or worsening symptoms.    Medication Adjustments/Labs and Tests Ordered: Current medicines are reviewed at length with the patient today.  Concerns regarding medicines are outlined above.  Orders Placed This Encounter  Procedures   CBC   Basic metabolic panel with GFR   No orders of the defined types were placed in this encounter.   Patient Instructions  Medication Instructions:  No medication changes were made during today's visit.   Please call [phone] to make an appointment for cardiac rehab.  *If you need a refill on  your cardiac medications before your next appointment, please call your pharmacy*  Lab Work: BMET, CBC If you have labs (blood work) drawn today and your  tests are completely normal, you will receive your results only by: MyChart Message (if you have MyChart) OR A paper copy in the mail If you have any lab test that is abnormal or we need to change your treatment, we will call you to review the results.  Testing/Procedures: No procedures were ordered during today's visit.   Follow-Up: At [facility], you and your health needs are our priority.  As part of our continuing mission to provide you with exceptional heart care, our providers are all part of one team.  This team includes your primary Cardiologist (physician) and Advanced Practice Providers or APPs (Physician Assistants and Nurse Practitioners) who all work together to provide you with the care you need, when you need it.   We recommend signing up for the patient portal called MyChart.  Sign up information is provided on this After Visit Summary.  MyChart is used to connect with patients for Virtual Visits (Telemedicine).  Patients are able to view lab/test results, encounter notes, upcoming appointments, etc.  Non-urgent messages can be sent to your provider as well.   To learn more about what you can do with MyChart, go to [email or web address].   Other Instructions            Signed, Miriam FORBES Shams, NP  10/07/2024 8:21 PM    [facility]     [1]  Current Meds  Medication Sig   acetaminophen  (TYLENOL ) 325 MG tablet Take 650 mg by mouth every 6 (six) hours as needed for moderate pain (pain score 4-6). Pt takes 2 tablet daily as needed.   amiodarone  (PACERONE ) 200 MG tablet Take 1 tablet (200 mg total) by mouth daily. Stop taking 11/07/24.   aspirin  EC 81 MG tablet Take 81 mg by mouth in the morning. Swallow whole.   buPROPion (WELLBUTRIN XL) 150 MG 24 hr tablet Take 150 mg by mouth every morning.   cetirizine (ZYRTEC) 10 MG tablet Take 10 mg by mouth in the morning.   diclofenac Sodium (VOLTAREN) 1 % GEL Apply 1 Application topically daily as needed  (pain).   flunisolide  (NASALIDE ) 25 MCG/ACT (0.025%) SOLN Place 1-2 sprays into the nose 2 (two) times daily as needed (nasal congestion).   furosemide  (LASIX ) 40 MG tablet Take 1 tablet (40 mg total) by mouth daily.   ipratropium (ATROVENT ) 0.03 % nasal spray Place 1-2 sprays into both nostrils 2 (two) times daily as needed (nasal drainage).   metoprolol  tartrate (LOPRESSOR ) 12.5 mg TABS tablet Take 12.5 mg by mouth 2 (two) times daily.   montelukast  (SINGULAIR ) 10 MG tablet Take 1 tablet (10 mg total) by mouth daily as needed (for allergies). (Patient taking differently: Take 10 mg by mouth in the morning.)   mupirocin  ointment (BACTROBAN ) 2 % Apply 1 Application topically daily.   rivaroxaban  (XARELTO ) 20 MG TABS tablet Take 1 tablet (20 mg total) by mouth daily with supper.   rosuvastatin  (CRESTOR ) 20 MG tablet Take 20 mg by mouth at bedtime.   sildenafil (VIAGRA) 100 MG tablet Take 100 mg by mouth daily as needed for erectile dysfunction.   tadalafil (CIALIS) 20 MG tablet Take 20 mg by mouth in the morning.   "

## 2024-10-05 ENCOUNTER — Other Ambulatory Visit: Payer: Self-pay | Admitting: Surgery

## 2024-10-05 DIAGNOSIS — Z953 Presence of xenogenic heart valve: Secondary | ICD-10-CM

## 2024-10-06 ENCOUNTER — Ambulatory Visit: Payer: Self-pay | Attending: Surgery | Admitting: Surgery

## 2024-10-06 ENCOUNTER — Encounter: Payer: Self-pay | Admitting: Surgery

## 2024-10-06 ENCOUNTER — Ambulatory Visit

## 2024-10-06 ENCOUNTER — Ambulatory Visit (HOSPITAL_COMMUNITY)
Admission: RE | Admit: 2024-10-06 | Discharge: 2024-10-06 | Disposition: A | Discharge status: Home / Self Care | Source: Ambulatory Visit | Attending: Cardiovascular Disease | Admitting: Cardiovascular Disease

## 2024-10-06 VITALS — BP 150/71 | HR 64 | Resp 18 | Ht 69.0 in | Wt 163.0 lb

## 2024-10-06 DIAGNOSIS — Z951 Presence of aortocoronary bypass graft: Secondary | ICD-10-CM

## 2024-10-06 DIAGNOSIS — Z953 Presence of xenogenic heart valve: Secondary | ICD-10-CM | POA: Diagnosis present

## 2024-10-07 ENCOUNTER — Encounter: Payer: Self-pay | Admitting: Emergency Medicine

## 2024-10-07 ENCOUNTER — Ambulatory Visit: Attending: Emergency Medicine | Admitting: Emergency Medicine

## 2024-10-07 VITALS — BP 134/72 | HR 63 | Ht 68.5 in | Wt 163.6 lb

## 2024-10-07 DIAGNOSIS — Z953 Presence of xenogenic heart valve: Secondary | ICD-10-CM | POA: Insufficient documentation

## 2024-10-07 DIAGNOSIS — I25119 Atherosclerotic heart disease of native coronary artery with unspecified angina pectoris: Secondary | ICD-10-CM | POA: Insufficient documentation

## 2024-10-07 DIAGNOSIS — I480 Paroxysmal atrial fibrillation: Secondary | ICD-10-CM | POA: Diagnosis present

## 2024-10-07 LAB — BASIC METABOLIC PANEL WITH GFR
BUN/Creatinine Ratio: 15 (ref 10–24)
BUN: 19 mg/dL (ref 8–27)
CO2: 19 mmol/L — ABNORMAL LOW (ref 20–29)
Calcium: 9.3 mg/dL (ref 8.6–10.2)
Chloride: 106 mmol/L (ref 96–106)
Creatinine, Ser: 1.27 mg/dL (ref 0.76–1.27)
Glucose: 115 mg/dL — ABNORMAL HIGH (ref 70–99)
Potassium: 4.8 mmol/L (ref 3.5–5.2)
Sodium: 142 mmol/L (ref 134–144)
eGFR: 58 mL/min/{1.73_m2} — ABNORMAL LOW

## 2024-10-07 LAB — CBC
Hematocrit: 41.7 % (ref 37.5–51.0)
Hemoglobin: 13.3 g/dL (ref 13.0–17.7)
MCH: 27 pg (ref 26.6–33.0)
MCHC: 31.9 g/dL (ref 31.5–35.7)
MCV: 85 fL (ref 79–97)
Platelets: 286 10*3/uL (ref 150–450)
RBC: 4.92 x10E6/uL (ref 4.14–5.80)
RDW: 12.4 % (ref 11.6–15.4)
WBC: 5.9 10*3/uL (ref 3.4–10.8)

## 2024-10-07 NOTE — Patient Instructions (Addendum)
 Medication Instructions:  No medication changes were made during today's visit.   Please call [phone] to make an appointment for cardiac rehab.  *If you need a refill on your cardiac medications before your next appointment, please call your pharmacy*  Lab Work: BMET, CBC If you have labs (blood work) drawn today and your tests are completely normal, you will receive your results only by: MyChart Message (if you have MyChart) OR A paper copy in the mail If you have any lab test that is abnormal or we need to change your treatment, we will call you to review the results.  Testing/Procedures: No procedures were ordered during today's visit.   Follow-Up: At [facility], you and your health needs are our priority.  As part of our continuing mission to provide you with exceptional heart care, our providers are all part of one team.  This team includes your primary Cardiologist (physician) and Advanced Practice Providers or APPs (Physician Assistants and Nurse Practitioners) who all work together to provide you with the care you need, when you need it.   We recommend signing up for the patient portal called MyChart.  Sign up information is provided on this After Visit Summary.  MyChart is used to connect with patients for Virtual Visits (Telemedicine).  Patients are able to view lab/test results, encounter notes, upcoming appointments, etc.  Non-urgent messages can be sent to your provider as well.   To learn more about what you can do with MyChart, go to [email or web address].   Other Instructions

## 2024-10-07 NOTE — Assessment & Plan Note (Addendum)
 CAD s/p CABG X1 09/07/2024: Single-vessel CABG using LIMA-LAD, AVR with 23mm [employer] Inspiris bioprosthetic valve and clipping to the left atrial appendage Continue sternal precautions as directed by CVTS.  No lifting over 10 pounds for a full 3 months following surgery. He met with Dr. Sherrine yesterday with repeat chest x-ray, this note is not yet available to me for review. Patient reported Dr. Sherrine was okay with low intensity activities, like swimming. Dr. Wonda cleared patient for cardiac rehab at previous visit. Cardiac rehab team has reached out already, patient is awaiting for them to return call. Will provide phone number in discharge paperwork today.  Will update BMP and CBC today SBE prophylaxis for all dental procedures discussed. No cardiac symptoms, normal cardiac exam. Continue advancing activity as tolerated, with precautions as above.  Continue low dose aspirin  EC 81 mg daily, lopressor  12.5 mg BID, rosuvastatin  20 mg daily

## 2024-10-08 ENCOUNTER — Telehealth: Payer: Self-pay

## 2024-10-08 ENCOUNTER — Ambulatory Visit: Payer: Self-pay | Admitting: Emergency Medicine

## 2024-10-08 DIAGNOSIS — Z953 Presence of xenogenic heart valve: Secondary | ICD-10-CM

## 2024-10-08 DIAGNOSIS — Z79899 Other long term (current) drug therapy: Secondary | ICD-10-CM

## 2024-10-08 NOTE — Telephone Encounter (Signed)
 Kidney function appears mildly elevated since your last lab draw 3 weeks prior. Otherwise appears stable. Lets repeat a BMP in 2 weeks, as I suspect this will improve given you have recently stopped Lasix .   Spoke with pt. Labs ordered. Pt knows to return in 2 wks

## 2024-10-09 ENCOUNTER — Encounter: Payer: Self-pay | Admitting: Surgery

## 2024-10-09 NOTE — Progress Notes (Signed)
 "  [address], [place] [address]             [phone]    HPI: Patient returns for routine postoperative follow-up having undergone coronary bypass graft surgery x 1 and aortic valve replacement using a 23 mm [employer] INSPIRIS RESILIA pericardial valve with clipping of left atrial appendage on 09/07/2024. The patient's early postoperative recovery while in the hospital was notable for an uncomplicated postoperative course. Since hospital discharge the patient reports that he continues to improve.  He denies any shortness of breath or chest discomfort.  He has mainly been limited by his chronic back pain.   Current Outpatient Medications  Medication Sig Dispense Refill   acetaminophen  (TYLENOL ) 325 MG tablet Take 650 mg by mouth every 6 (six) hours as needed for moderate pain (pain score 4-6). Pt takes 2 tablet daily as needed.     amiodarone  (PACERONE ) 200 MG tablet Take 1 tablet (200 mg total) by mouth daily. Stop taking 11/07/24. 40 tablet 0   aspirin  EC 81 MG tablet Take 81 mg by mouth in the morning. Swallow whole.     buPROPion (WELLBUTRIN XL) 150 MG 24 hr tablet Take 150 mg by mouth every morning.     cetirizine (ZYRTEC) 10 MG tablet Take 10 mg by mouth in the morning.     diclofenac Sodium (VOLTAREN) 1 % GEL Apply 1 Application topically daily as needed (pain).     flunisolide  (NASALIDE ) 25 MCG/ACT (0.025%) SOLN Place 1-2 sprays into the nose 2 (two) times daily as needed (nasal congestion). 25 mL 3   furosemide  (LASIX ) 40 MG tablet Take 1 tablet (40 mg total) by mouth daily. 14 tablet 0   ipratropium (ATROVENT ) 0.03 % nasal spray Place 1-2 sprays into both nostrils 2 (two) times daily as needed (nasal drainage). 30 mL 3   metoprolol  tartrate (LOPRESSOR ) 12.5 mg TABS tablet Take 12.5 mg by mouth 2 (two) times daily.     montelukast  (SINGULAIR ) 10 MG tablet Take 1 tablet (10 mg total) by mouth daily as needed (for allergies). (Patient taking differently: Take 10  mg by mouth in the morning.) 90 tablet 0   rivaroxaban  (XARELTO ) 20 MG TABS tablet Take 1 tablet (20 mg total) by mouth daily with supper. 30 tablet 2   rosuvastatin  (CRESTOR ) 20 MG tablet Take 20 mg by mouth at bedtime.     sildenafil (VIAGRA) 100 MG tablet Take 100 mg by mouth daily as needed for erectile dysfunction.     tadalafil (CIALIS) 20 MG tablet Take 20 mg by mouth in the morning.     traZODone (DESYREL) 50 MG tablet Take 50-100 mg by mouth at bedtime as needed.     mupirocin  ointment (BACTROBAN ) 2 % Apply 1 Application topically daily.     No current facility-administered medications for this visit.    Physical Exam: BP (!) 150/71   Pulse 64   Resp 18   Ht 5' 9 (1.753 m)   Wt 163 lb (73.9 kg)   SpO2 98% Comment: RA  BMI 24.07 kg/m  He looks well. Cardiac exam shows a regular rate and rhythm with normal heart sounds. Lungs are clear. The chest incision is healing well and the sternum is stable. There is no peripheral edema.  Diagnostic Tests:  Narrative & Impression  EXAM: 2 VIEW(S) XRAY OF THE CHEST 10/06/2024 04:26:57 PM   COMPARISON: 09/20/2024   CLINICAL HISTORY: Aortic valve stenosis.   FINDINGS:  LUNGS AND PLEURA: Small left pleural effusion, decreased compared to prior exam. No focal pulmonary opacity. No pneumothorax.   HEART AND MEDIASTINUM: CABG changes noted. Prosthetic aortic valve noted. Left atrial appendage clip noted. No acute abnormality of the cardiac and mediastinal silhouettes.   BONES AND SOFT TISSUES: Cervical spine fusion hardware noted. Intact sternotomy wires. No acute osseous abnormality.   IMPRESSION: 1. Postsurgical changes of aortic valve replacement. 2. Small left pleural effusion.   Electronically signed by: Elsie Gravely MD 10/06/2024 05:04 PM EST RP Workstation: [id number]      Impression:  Overall he is doing very well 1 month following his surgery.  There is a tiny residual left pleural effusion which  should resolve on its own.  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  He is interested in having an injection in his back and I think that would be fine as long as it is done completely sterilely.  Plan:  He will continue to follow-up with cardiology and his PCP and will return to see me if he has any problems with his incision.   Dorise MARLA Fellers, MD [facility] [phone]  "

## 2024-10-11 NOTE — Telephone Encounter (Signed)
 Caller Name/ Relationship to Patient: [name]/self  Best Contact Number: [phone]  Okay to Leave Voice Mail?  yes  Provider Name:  [facility]  Pharmacy Name & Location:   Last Office Visit:   Next Office Visit:    Symptoms / Complaints:  What symptoms are you currently having?  (Check HPKW document)                           How long have you had these symptoms?  if new problem (never experienced) or drastic change in severity AND matches HPKW document, warm transfer to RN    Is there any other relevant information you would like to share?    Name of Nurse taking the call  For HPKW- Get RN- Send to [facility] [place] [facility] Triage    Prior Authorization/Refill Requests Review Medication List in [place]                                                                                         Prior Auth required, if pharmacy is calling, please provide: Medication Name, Insurance Name, BIN Number, and Subscriber ID    Obtain the following details for each medication:  If patient states currently out of medication or will run out prior to next business day  (send HP message) Medication Name, Dosage, Quantity, Frequency, Quantity Requested (e.g. 30 day supply)   General Requests Will be answered next business day   Detailed reason for the call: (Referrals-Include reason for the referral; type of form; general inquiry; new Rx request) Patient would like to know if he could get an injection on the same day as the appointment if Dr Milissa decides.  OK to leave a message.     Current Medications[1]       [1] Current Outpatient Medications  Medication Sig Dispense Refill   acetaminophen  (TYLENOL ) 500 mg tablet Take 2 tablets by mouth every 6 (six) hours. As needed     albuterol  sulfate (Proair  Digihaler) 90 mcg/actuation aebs inhale 2 puff by inhalation route  every 4 - 6 hours as needed (Patient taking differently: as needed.)     cetirizine (ZyrTEC) 10 mg tablet Take 10 mg by mouth  daily.     flunisolide  (NASALIDE ) 25 mcg (0.025 %) spry nasal spray Administer 2 sprays into each nostril as needed.     fluticasone  propionate (Flonase  Allergy Relief) 50 mcg/spray nasal spray Administer 1-2 sprays into each nostril. every day in each nostril as needed     FOLIC ACID  ORAL Take 1 tablet by mouth daily.     methotrexate 2.5 mg tablet Take 7.5 mg by mouth once a week.     montelukast  (SINGULAIR ) 10 mg tablet Take 1 tablet (10 mg total) by mouth at bedtime. every day in the evening 90 tablet 1   sildenafiL (VIAGRA) 100 mg tablet Take 1 tablet by mouth. every day as needed approximately 1 hour before sexual activity (Patient taking differently: Take 1 tablet by mouth as needed. every day as needed approximately 1 hour before sexual activity)     simvastatin  (ZOCOR ) 40 mg tablet Take  1 tablet by mouth. every day in the evening (Patient taking differently: Take 1 tablet by mouth at bedtime. every day in the evening)     tadalafiL (CIALIS) 5 mg tablet Take 1 tablet (5 mg total) by mouth daily. 90 tablet 3   No current facility-administered medications for this visit.

## 2024-10-12 NOTE — Telephone Encounter (Signed)
 Called and spoke to the pt and answered his questions about his upcoming ESI. Pt stated a verbal understanding and will keep our his appointment tomorrow.

## 2024-10-19 ENCOUNTER — Ambulatory Visit (HOSPITAL_COMMUNITY)

## 2024-12-14 ENCOUNTER — Ambulatory Visit: Admitting: Physician Assistant
